# Patient Record
Sex: Male | Born: 1935 | Race: White | Hispanic: No | Smoking: Never smoker
Health system: Southern US, Community
[De-identification: ages and names within clinical notes are randomized; demographics above are authoritative.]

## PROBLEM LIST (undated history)

## (undated) DIAGNOSIS — I472 Ventricular tachycardia, unspecified: Secondary | ICD-10-CM

## (undated) DIAGNOSIS — E039 Hypothyroidism, unspecified: Secondary | ICD-10-CM

## (undated) DIAGNOSIS — I219 Acute myocardial infarction, unspecified: Secondary | ICD-10-CM

## (undated) DIAGNOSIS — K279 Peptic ulcer, site unspecified, unspecified as acute or chronic, without hemorrhage or perforation: Secondary | ICD-10-CM

## (undated) DIAGNOSIS — E785 Hyperlipidemia, unspecified: Secondary | ICD-10-CM

## (undated) DIAGNOSIS — I10 Essential (primary) hypertension: Secondary | ICD-10-CM

## (undated) DIAGNOSIS — M069 Rheumatoid arthritis, unspecified: Secondary | ICD-10-CM

## (undated) DIAGNOSIS — I509 Heart failure, unspecified: Secondary | ICD-10-CM

## (undated) DIAGNOSIS — M1990 Unspecified osteoarthritis, unspecified site: Secondary | ICD-10-CM

## (undated) DIAGNOSIS — I255 Ischemic cardiomyopathy: Secondary | ICD-10-CM

## (undated) DIAGNOSIS — Z95810 Presence of automatic (implantable) cardiac defibrillator: Secondary | ICD-10-CM

## (undated) DIAGNOSIS — E119 Type 2 diabetes mellitus without complications: Secondary | ICD-10-CM

## (undated) DIAGNOSIS — I2510 Atherosclerotic heart disease of native coronary artery without angina pectoris: Secondary | ICD-10-CM

## (undated) HISTORY — DX: Ventricular tachycardia, unspecified: I47.20

## (undated) HISTORY — DX: Atherosclerotic heart disease of native coronary artery without angina pectoris: I25.10

## (undated) HISTORY — DX: Peptic ulcer, site unspecified, unspecified as acute or chronic, without hemorrhage or perforation: K27.9

## (undated) HISTORY — PX: INGUINAL HERNIA REPAIR: SUR1180

## (undated) HISTORY — DX: Heart failure, unspecified: I50.9

## (undated) HISTORY — DX: Ischemic cardiomyopathy: I25.5

## (undated) HISTORY — DX: Ventricular tachycardia: I47.2

## (undated) HISTORY — DX: Essential (primary) hypertension: I10

## (undated) HISTORY — DX: Hyperlipidemia, unspecified: E78.5

---

## 1998-08-16 HISTORY — PX: CORONARY ARTERY BYPASS GRAFT: SHX141

## 2006-09-07 ENCOUNTER — Ambulatory Visit: Payer: Self-pay | Admitting: Cardiology

## 2006-09-22 ENCOUNTER — Ambulatory Visit: Payer: Self-pay

## 2006-11-01 ENCOUNTER — Ambulatory Visit: Payer: Self-pay | Admitting: Internal Medicine

## 2006-11-29 ENCOUNTER — Ambulatory Visit: Payer: Self-pay | Admitting: Internal Medicine

## 2006-11-29 ENCOUNTER — Ambulatory Visit (HOSPITAL_COMMUNITY): Admission: RE | Admit: 2006-11-29 | Discharge: 2006-11-30 | Payer: Self-pay | Admitting: Internal Medicine

## 2006-12-19 ENCOUNTER — Ambulatory Visit: Payer: Self-pay | Admitting: Cardiology

## 2007-04-04 ENCOUNTER — Ambulatory Visit: Payer: Self-pay | Admitting: Internal Medicine

## 2007-06-19 ENCOUNTER — Ambulatory Visit: Payer: Self-pay | Admitting: Cardiology

## 2007-07-06 ENCOUNTER — Ambulatory Visit: Payer: Self-pay | Admitting: Internal Medicine

## 2007-10-05 ENCOUNTER — Ambulatory Visit: Payer: Self-pay | Admitting: Internal Medicine

## 2008-01-04 ENCOUNTER — Ambulatory Visit: Payer: Self-pay | Admitting: Internal Medicine

## 2008-04-02 ENCOUNTER — Ambulatory Visit: Payer: Self-pay | Admitting: Internal Medicine

## 2008-04-02 ENCOUNTER — Ambulatory Visit: Payer: Self-pay | Admitting: Cardiology

## 2008-04-03 ENCOUNTER — Ambulatory Visit: Payer: Self-pay | Admitting: Internal Medicine

## 2008-05-16 HISTORY — PX: CARPAL TUNNEL RELEASE: SHX101

## 2008-06-04 ENCOUNTER — Ambulatory Visit (HOSPITAL_COMMUNITY): Admission: RE | Admit: 2008-06-04 | Discharge: 2008-06-04 | Payer: Self-pay | Admitting: Orthopedic Surgery

## 2008-07-04 ENCOUNTER — Ambulatory Visit: Payer: Self-pay | Admitting: Internal Medicine

## 2008-10-03 ENCOUNTER — Ambulatory Visit: Payer: Self-pay | Admitting: Internal Medicine

## 2008-10-31 ENCOUNTER — Encounter: Payer: Self-pay | Admitting: Internal Medicine

## 2009-01-02 ENCOUNTER — Ambulatory Visit: Payer: Self-pay | Admitting: Internal Medicine

## 2009-01-07 ENCOUNTER — Encounter: Payer: Self-pay | Admitting: Internal Medicine

## 2009-01-29 ENCOUNTER — Encounter (INDEPENDENT_AMBULATORY_CARE_PROVIDER_SITE_OTHER): Payer: Self-pay | Admitting: *Deleted

## 2009-04-12 DIAGNOSIS — E119 Type 2 diabetes mellitus without complications: Secondary | ICD-10-CM

## 2009-04-12 DIAGNOSIS — M069 Rheumatoid arthritis, unspecified: Secondary | ICD-10-CM | POA: Insufficient documentation

## 2009-04-12 DIAGNOSIS — IMO0001 Reserved for inherently not codable concepts without codable children: Secondary | ICD-10-CM | POA: Insufficient documentation

## 2009-04-12 DIAGNOSIS — R079 Chest pain, unspecified: Secondary | ICD-10-CM

## 2009-04-12 DIAGNOSIS — Z794 Long term (current) use of insulin: Secondary | ICD-10-CM

## 2009-04-12 DIAGNOSIS — R0602 Shortness of breath: Secondary | ICD-10-CM

## 2009-04-12 DIAGNOSIS — E785 Hyperlipidemia, unspecified: Secondary | ICD-10-CM | POA: Insufficient documentation

## 2009-04-12 DIAGNOSIS — I10 Essential (primary) hypertension: Secondary | ICD-10-CM

## 2009-04-12 DIAGNOSIS — K279 Peptic ulcer, site unspecified, unspecified as acute or chronic, without hemorrhage or perforation: Secondary | ICD-10-CM | POA: Insufficient documentation

## 2009-04-17 ENCOUNTER — Encounter: Payer: Self-pay | Admitting: Cardiology

## 2009-04-17 ENCOUNTER — Ambulatory Visit: Payer: Self-pay | Admitting: Internal Medicine

## 2009-04-17 ENCOUNTER — Ambulatory Visit: Payer: Self-pay | Admitting: Cardiology

## 2009-04-17 DIAGNOSIS — Z951 Presence of aortocoronary bypass graft: Secondary | ICD-10-CM

## 2009-04-17 DIAGNOSIS — I255 Ischemic cardiomyopathy: Secondary | ICD-10-CM

## 2009-04-17 DIAGNOSIS — I5023 Acute on chronic systolic (congestive) heart failure: Secondary | ICD-10-CM | POA: Insufficient documentation

## 2009-05-29 ENCOUNTER — Encounter: Payer: Self-pay | Admitting: Cardiology

## 2009-07-07 ENCOUNTER — Telehealth: Payer: Self-pay | Admitting: Cardiology

## 2009-07-14 ENCOUNTER — Telehealth: Payer: Self-pay | Admitting: Cardiology

## 2009-07-24 ENCOUNTER — Encounter: Payer: Self-pay | Admitting: Internal Medicine

## 2009-10-14 ENCOUNTER — Encounter: Payer: Self-pay | Admitting: Cardiology

## 2009-10-23 ENCOUNTER — Ambulatory Visit: Payer: Self-pay | Admitting: Internal Medicine

## 2010-02-05 ENCOUNTER — Ambulatory Visit: Payer: Self-pay | Admitting: Internal Medicine

## 2010-02-26 ENCOUNTER — Encounter: Payer: Self-pay | Admitting: Internal Medicine

## 2010-03-16 HISTORY — PX: CARPAL TUNNEL RELEASE: SHX101

## 2010-04-08 ENCOUNTER — Ambulatory Visit (HOSPITAL_COMMUNITY): Admission: RE | Admit: 2010-04-08 | Discharge: 2010-04-08 | Payer: Self-pay | Admitting: Orthopedic Surgery

## 2010-05-07 ENCOUNTER — Ambulatory Visit: Payer: Self-pay | Admitting: Internal Medicine

## 2010-06-08 ENCOUNTER — Ambulatory Visit: Payer: Self-pay | Admitting: Cardiology

## 2010-08-05 ENCOUNTER — Telehealth: Payer: Self-pay | Admitting: Cardiology

## 2010-08-06 ENCOUNTER — Ambulatory Visit: Payer: Self-pay | Admitting: Internal Medicine

## 2010-08-11 ENCOUNTER — Encounter: Payer: Self-pay | Admitting: Internal Medicine

## 2010-08-18 ENCOUNTER — Encounter: Payer: Self-pay | Admitting: Internal Medicine

## 2010-08-28 ENCOUNTER — Telehealth (INDEPENDENT_AMBULATORY_CARE_PROVIDER_SITE_OTHER): Payer: Self-pay | Admitting: *Deleted

## 2010-08-31 ENCOUNTER — Ambulatory Visit: Admission: RE | Admit: 2010-08-31 | Discharge: 2010-08-31 | Payer: Self-pay | Source: Home / Self Care

## 2010-08-31 ENCOUNTER — Encounter (HOSPITAL_COMMUNITY)
Admission: RE | Admit: 2010-08-31 | Discharge: 2010-09-15 | Payer: Self-pay | Source: Home / Self Care | Attending: Cardiology | Admitting: Cardiology

## 2010-08-31 ENCOUNTER — Encounter: Payer: Self-pay | Admitting: Internal Medicine

## 2010-09-15 NOTE — Letter (Signed)
Summary: Remote Device Check  [employer], Main Office  [address]   [place], [place] [address]   Phone: [phone]  Fax: [phone]     February 26, 2010 MRN: [id number]   [name] [address] [place], [place]  [address]   Dear [name],   Your remote transmission was recieved and reviewed by your physician.  All diagnostics were within normal limits for you.   ___X___Your next office visit is scheduled for:  05-07-10 @ 945am with Dr Graciela Husbands.   Sincerely,  Vella Kohler

## 2010-09-15 NOTE — Cardiovascular Report (Signed)
Summary: Office Visit Remote   Office Visit Remote   Imported By: Roderic Ovens 02/27/2010 16:15:31  _____________________________________________________________________  External Attachment:    Type:   Image     Comment:   External Document

## 2010-09-15 NOTE — Assessment & Plan Note (Signed)
Summary: F1Y/DM   Primary Provider:  Lucila Maine, MD   History of Present Illness: [name] is a pleasant gentleman who has a history of coronary disease, status post coronary artery bypassing graft, ischemic cardiomyopathy, hypertension, diabetes, and hyperlipidemia.  His most recent Myoview was performed on September 22, 2006.  At that time, his ejection fraction was 26%.  There was moderate- to-large apical and inferior infarction, but there was no ischemia.  He also has had a previous defibrillator placed.  I last saw him in Sept 2010. Since then he has dyspnea with more extreme activities but not with routine activities. It is relieved with rest. There is no associated chest pain. There is no orthopnea, PND, pedal edema, syncope, palpitations. His ICD has not discharged.  Current Medications (verified): 1)  Lisinopril 10 Mg Tabs (Lisinopril) .... Take One Tablet By Mouth Daily 2)  Carvedilol 25 Mg Tabs (Carvedilol) .... Take 1 Tablet By Mouth Twice A Day 3)  Lantus 100 Unit/ml Soln (Insulin Glargine) .... Uad 4)  Glyburide-Metformin 5-500 Mg Tabs (Glyburide-Metformin) .... Take One Tablet By Mouth Once Daily. 5)  Nexium 40 Mg Cpdr (Esomeprazole Magnesium) .... Take One Tablet By Mouth Once Daily. 6)  Meloxicam 7.5 .... Three Times A Day 7)  Aspirin Ec 325 Mg Tbec (Aspirin) .... Take One Tablet By Mouth Once Daily. 8)  Glucosamine-Chondroitin  Caps (Glucosamine-Chondroit-Vit C-Mn) .... Take One Tablet By Mouth Once Daily. 9)  Lipitor 10 Mg Tabs (Atorvastatin Calcium) .... Take One Tablet By Mouth Daily. 10)  Niaspan 1000 Mg Cr-Tabs (Niacin (Antihyperlipidemic)) .Marland Kitchen.. 1 Tab By Mouth Once Daily  Allergies: No Known Drug Allergies  Past History:  Past Medical History: HYPERLIPIDEMIA (ICD-272.4) HYPERTENSION (ICD-401.9) CAD (ICD-414.00) CARDIOMYOPATHY (ICD-425.4) CHF (ICD-428.0) ARTHRITIS, RHEUMATOID (ICD-714.0) DM (ICD-250.00) PUD (ICD-533.90)  Past Surgical History: Reviewed  history from 04/12/2009 and no changes required.  Decompression, left median nerve.      status post coronary artery bypassing  graft   Status post implantable cardioverter defibrillator  Social History: Reviewed history from 04/12/2009 and no changes required. As noted previously, he is married.  He has 1 surviving  child and 4 surviving grandchildren.  He does not use cigarettes,  alcohol, or recreational drugs.  Review of Systems       Problems with arthralgias but no fevers or chills, productive cough, hemoptysis, dysphasia, odynophagia, melena, hematochezia, dysuria, hematuria, rash, seizure activity, orthopnea, PND, pedal edema, claudication. Remaining systems are negative.   Vital Signs:  Patient profile:   76 year old male Height:      71 inches Weight:      210 pounds BMI:     29.39 Pulse rate:   71 / minute Resp:     14 per minute BP sitting:   118 / 70  (left arm)  Vitals Entered By: [name] (June 08, 2010 9:57 AM)  Physical Exam  General:  Well-developed well-nourished in no acute distress.  Skin is warm and dry.  HEENT is normal.  Neck is supple. No thyromegaly.  Chest is clear to auscultation with normal expansion.  Cardiovascular exam is regular rate and rhythm.  Abdominal exam nontender or distended. No masses palpated. Extremities show no edema. neuro grossly intact    EKG  Procedure date:  06/08/2010  Findings:      normal sinus rhythm at a rate of 71. Left axis deviation. Prior anterior and inferior infarct. Nonspecific T-wave changes.   ICD Specifications Following MD:  Raymond Manges, MD  Referring MD:  [name] ICD Vendor:  [employer]     ICD Model Number:  T175     ICD Serial Number:  [id number] ICD DOI:  11/29/2006     ICD Implanting MD:  Raymond Manges, MD  Lead 1:    Location: RV     DOI: 11/29/2006     Model #: [id number]     Serial #: [id number]     Status: active  Indications::  ICM   ICD Follow Up ICD Dependent:  No        Brady Parameters Mode VVI     Lower Rate Limit:  40      Tachy Zones VF:  220     VT:  200     VT1:  170     Impression & Recommendations:  Problem # 1:  CAD (ICD-414.00)  Continue aspirin, beta blocker, ACE inhibitor and statin. Schedule Myoview for risk stratification. His updated medication list for this problem includes:    Lisinopril 10 Mg Tabs (Lisinopril) .Marland Kitchen... Take one tablet by mouth daily    Carvedilol 25 Mg Tabs (Carvedilol) .Marland Kitchen... Take 1 tablet by mouth twice a day    Aspirin Ec 325 Mg Tbec (Aspirin) .Marland Kitchen... Take one tablet by mouth once daily.  His updated medication list for this problem includes:    Lisinopril 10 Mg Tabs (Lisinopril) .Marland Kitchen... Take one tablet by mouth daily    Carvedilol 25 Mg Tabs (Carvedilol) .Marland Kitchen... Take 1 tablet by mouth twice a day    Aspirin Ec 325 Mg Tbec (Aspirin) .Marland Kitchen... Take one tablet by mouth once daily.  Orders: EKG w/ Interpretation (93000) Nuclear Stress Test (Nuc Stress Test)  Problem # 2:  CARDIOMYOPATHY, ISCHEMIC (ICD-414.8)  Continued ACE inhibitor and beta blocker. His updated medication list for this problem includes:    Lisinopril 10 Mg Tabs (Lisinopril) .Marland Kitchen... Take one tablet by mouth daily    Carvedilol 25 Mg Tabs (Carvedilol) .Marland Kitchen... Take 1 tablet by mouth twice a day    Aspirin Ec 325 Mg Tbec (Aspirin) .Marland Kitchen... Take one tablet by mouth once daily.  His updated medication list for this problem includes:    Lisinopril 10 Mg Tabs (Lisinopril) .Marland Kitchen... Take one tablet by mouth daily    Carvedilol 25 Mg Tabs (Carvedilol) .Marland Kitchen... Take 1 tablet by mouth twice a day    Aspirin Ec 325 Mg Tbec (Aspirin) .Marland Kitchen... Take one tablet by mouth once daily.  Problem # 3:  IMPLANTABLE  DEFIBRILLATOR GDT (ICD-V45.02) Magement per electrophysiology.  Problem # 4:  HYPERLIPIDEMIA (ICD-272.4) Continue present medications. Lipids and liver monitored by primary care. His updated medication list for this problem includes:    Lipitor 10 Mg Tabs (Atorvastatin calcium)  .Marland Kitchen... Take one tablet by mouth daily.    Niaspan 1000 Mg Cr-tabs (Niacin (antihyperlipidemic)) .Marland Kitchen... 1 tab by mouth once daily  His updated medication list for this problem includes:    Lipitor 10 Mg Tabs (Atorvastatin calcium) .Marland Kitchen... Take one tablet by mouth daily.    Niaspan 1000 Mg Cr-tabs (Niacin (antihyperlipidemic)) .Marland Kitchen... 1 tab by mouth once daily  Problem # 5:  HYPERTENSION (ICD-401.9) Blood pressure controlled on present medications. Will continue. Potassium and renal function monitored by primary care. His updated medication list for this problem includes:    Lisinopril 10 Mg Tabs (Lisinopril) .Marland Kitchen... Take one tablet by mouth daily    Carvedilol 25 Mg Tabs (Carvedilol) .Marland Kitchen... Take 1 tablet by mouth twice a day    Aspirin Ec 325 Mg  Tbec (Aspirin) .Marland Kitchen... Take one tablet by mouth once daily.  Problem # 6:  DM (ICD-250.00)  His updated medication list for this problem includes:    Lisinopril 10 Mg Tabs (Lisinopril) .Marland Kitchen... Take one tablet by mouth daily    Lantus 100 Unit/ml Soln (Insulin glargine) ..... Uad    Glyburide-metformin 5-500 Mg Tabs (Glyburide-metformin) .Marland Kitchen... Take one tablet by mouth once daily.    Aspirin Ec 325 Mg Tbec (Aspirin) .Marland Kitchen... Take one tablet by mouth once daily.  His updated medication list for this problem includes:    Lisinopril 10 Mg Tabs (Lisinopril) .Marland Kitchen... Take one tablet by mouth daily    Lantus 100 Unit/ml Soln (Insulin glargine) ..... Uad    Glyburide-metformin 5-500 Mg Tabs (Glyburide-metformin) .Marland Kitchen... Take one tablet by mouth once daily.    Aspirin Ec 325 Mg Tbec (Aspirin) .Marland Kitchen... Take one tablet by mouth once daily.  Problem # 7:  ARTHRITIS, RHEUMATOID (ICD-714.0)  Patient Instructions: 1)  Your physician recommends that you schedule a follow-up appointment in: YEAR WITH DR CRENSHAW 2)  Your physician recommends that you continue on your current medications as directed. Please refer to the Current Medication list given to you today. 3)  Your physician has  requested that you have an LEXISCAN myoview.  For further information please visit [email or web address]  Please follow instruction sheet, as given.

## 2010-09-15 NOTE — Cardiovascular Report (Signed)
Summary: Office Visit Remote   Office Visit Remote   Imported By: Roderic Ovens 11/03/2009 14:01:39  _____________________________________________________________________  External Attachment:    Type:   Image     Comment:   External Document

## 2010-09-15 NOTE — Procedures (Signed)
Summary: Cardiology Device Clinic   Allergies: No Known Drug Allergies   ICD Specifications Following MD:  Sherryl Manges, MD     Referring MD:  [name] ICD Vendor:  [facility] [employer]     ICD Model Number:  T175     ICD Serial Number:  [id number] ICD DOI:  11/29/2006     ICD Implanting MD:  Sherryl Manges, MD  Lead 1:    Location: RV     DOI: 11/29/2006     Model #: [id number]     Serial #: [id number]     Status: active  Indications::  ICM   ICD Follow Up Remote Check?  No Battery Voltage:  2.99 V     Charge Time:  8.2 seconds     Underlying rhythm:  SR ICD Dependent:  No       ICD Device Measurements Right Ventricle:  Amplitude: 11.9 mV, Impedance: 530 ohms, Threshold: 0.6 V at 0.4 msec Shock Impedance: 40 ohms   Episodes Shock:  0     ATP:  0     Nonsustained:  0     Ventricular Pacing:  0%  Brady Parameters Mode VVI     Lower Rate Limit:  40      Tachy Zones VF:  220     VT:  200     VT1:  170     Next Remote Date:  08/06/2010     Next Cardiology Appt Due:  04/17/2011 Tech Comments:  No parameter changes.  Device function normal.  Latitude transmissions every 3 months.  ROV 1 year with Dr. Graciela Husbands in [place]. Altha Harm, LPN  May 07, 2010 10:18 AM

## 2010-09-17 NOTE — Progress Notes (Signed)
Summary: Nuclear Pre-Procedure  Phone Note Outgoing Call Call back at Home Phone [phone]   Call placed by: Stanton Kidney, EMT-P,  August 28, 2010 3:24 PM Call placed to: Patient Action Taken: Phone Call Completed Summary of Call: Left message with information on Myoview Information Sheet (see scanned document for details). Stanton Kidney, EMT-P  August 28, 2010 3:24 PM     Nuclear Med Background Indications for Stress Test: Evaluation for Ischemia, Graft Patency   History: CABG, Defibrillator, Myocardial Perfusion Study  History Comments: '00 CABG x4 '08 defibrillator 2/08 MPS: EF=26%  Symptoms: Chest Pain, DOE    Nuclear Pre-Procedure Cardiac Risk Factors: Family History - CAD, Hypertension, IDDM Type 2, Lipids Height (in): 71

## 2010-09-17 NOTE — Assessment & Plan Note (Signed)
Summary: Cardiology Nuclear Testing  Nuclear Med Background Indications for Stress Test: Evaluation for Ischemia, Graft Patency   History: CABG, Defibrillator, Myocardial Perfusion Study  History Comments: '00 CABG x 4; '08 ICD; '08 MPS:no ischemia, EF=26%  Symptoms: Chest Pain, DOE, Fatigue  Symptoms Comments: Last episode of CP:2-3 weeks ago.   Nuclear Pre-Procedure Cardiac Risk Factors: Family History - CAD, Hypertension, IDDM Type 2, Lipids Caffeine/Decaff Intake: 1 sip at 7:00 AM NPO After: 7:00 AM Lungs: Clear.  O2 Sat 98% on RA. IV 0.9% NS with Angio Cath: 20g     IV Site: R Antecubital IV Started by: Irean Hong, RN Chest Size (in) 42     Height (in): 71 Weight (lb): 209 BMI: 29.25  Nuclear Med Study 1 or 2 day study:  1 day     Stress Test Type:  Eugenie Birks Reading MD:  Arvilla Meres, MD     Referring MD:  Olga Millers, MD Resting Radionuclide:  Technetium 42m Tetrofosmin     Resting Radionuclide Dose:  11 mCi  Stress Radionuclide:  Technetium 11m Tetrofosmin     Stress Radionuclide Dose:  33 mCi   Stress Protocol   Lexiscan: 0.4 mg   Stress Test Technologist:  [employer]a [employer], CMA-N     Nuclear Technologist:  Doyne Keel, CNMT  Rest Procedure  Myocardial perfusion imaging was performed at rest 45 minutes following the intravenous administration of Technetium 55m Tetrofosmin.  Stress Procedure  The patient received IV Lexiscan 0.4 mg over 15-seconds.  Technetium 69m Tetrofosmin injected at 30-seconds.  There were no significant changes with infusion.  Quantitative spect images were obtained after a 45 minute delay.  QPS Raw Data Images:  LV is dilated. No significant motion. Stress Images:  Markedly decreased uptake throughout entire inferior wall, inferoseptum and apex. Rest Images:  Markedly decreased uptake throughout entire inferior wall, inferoseptum and apex. Subtraction (SDS):  Large infarct in inferior wall, inferoseptum and apex. No signifcant  ischemia. Transient Ischemic Dilatation:  0.97  (Normal <1.22)  Lung/Heart Ratio:  0.40  (Normal <0.45)  Quantitative Gated Spect Images QGS EDV:  131 ml QGS ESV:  90 ml QGS EF:  31 % QGS cine images:  The LV is dilated with dyskinesis of the septum and apex. Inferior hypokinesis.  Findings Abnormal nuclear study  Evidence for inferior infarct  Evidence for LV Dysfunction LV Dysfunction    Overall Impression  Exercise Capacity: Lexiscan with no exercise. ECG Impression: No significant ST segment change with Lexiscan. Overall Impression: Abnormal stress nuclear study. Overall Impression Comments: Large infarct in inferior wall, inferoseptum and apex. No signifcant ischemia. EF 31%.  Appended Document: Cardiology Nuclear Testing ok  Appended Document: Cardiology Nuclear Testing Left message to call back    Appended Document: Cardiology Nuclear Testing PT AWARE./CY

## 2010-09-17 NOTE — Progress Notes (Signed)
Summary: refill meds  Phone Note Refill Request Call back at Home Phone [phone] Message from:  Patient on August 05, 2010 4:11 PM  Refills Requested: Medication #1:  NIASPAN 1000 MG CR-TABS 1 tab by mouth once daily[facility] [place]    Method Requested: Fax to Local Pharmacy Initial call taken by: Lorne Skeens,  August 05, 2010 4:11 PM    Prescriptions: NIASPAN 1000 MG CR-TABS (NIACIN (ANTIHYPERLIPIDEMIC)) 1 tab by mouth once daily  #30 x 12   Entered by:   Kem Parkinson   Authorized by:   Ferman Hamming, MD, [facility]   Signed by:   Kem Parkinson on 08/06/2010   Method used:   [facility]  [address] [id number]       [address]       [place], [place]  [address]       Ph: [phone] or [phone]       Fax: [phone]   RxID:   [id number] NIASPAN 1000 MG CR-TABS (NIACIN (ANTIHYPERLIPIDEMIC)) 1 tab by mouth once daily  #90 x 3   Entered by:   Kem Parkinson   Authorized by:   Ferman Hamming, MD, [facility]   Signed by:   Kem Parkinson on 08/06/2010   Method used:   [facility]        [facility]  [id number]       [address]       [place], [place]  [address]       Ph: [phone] or [phone]       Fax: [phone]   RxID:   [id number]

## 2010-09-17 NOTE — Letter (Signed)
Summary: Device-Delinquent Phone Journalist, newspaper, Main Office  [address]   [place], [place] [address]   Phone: [phone]  Fax: [phone]     August 11, 2010 MRN: [id number]   KAHRON KAUTH [address] [place], [place]  [address]   Dear Mr. WISHAM,  According to our records, you were scheduled for a device phone transmission on 08-06-2010.     We did not receive any results from this check.  If you transmitted on your scheduled day, please call us to help troubleshoot your system.  If you forgot to send your transmission, please send one upon receipt of this letter.  Thank you,   Architectural technologist Device Clinic

## 2010-10-05 ENCOUNTER — Telehealth: Payer: Self-pay | Admitting: Cardiology

## 2010-10-07 NOTE — Cardiovascular Report (Signed)
Summary: Office Visit Remote   Office Visit Remote   Imported By: Roderic Ovens 09/29/2010 14:27:44  _____________________________________________________________________  External Attachment:    Type:   Image     Comment:   External Document

## 2010-10-13 NOTE — Progress Notes (Signed)
Summary: rx refill  Phone Note Refill Request Message from:  Patient on October 05, 2010 4:42 PM  Refills Requested: Medication #1:  NIASPAN 1000 MG CR-TABS 1 tab by mouth once daily. [facility] [phone] for 90 day supply   Method Requested: Fax to Local Pharmacy Initial call taken by: Roe Coombs,  October 05, 2010 4:43 PM    Prescriptions: NIASPAN 1000 MG CR-TABS (NIACIN (ANTIHYPERLIPIDEMIC)) 1 tab by mouth once daily  #90 Not Speci x 2   Entered by:   Kem Parkinson   Authorized by:   Ferman Hamming, MD, [facility]   Signed by:   Kem Parkinson on 10/06/2010   Method used:   Faxed to ...       [facility] (mail-[facility])       [id number]       [place], [place]  [id number]       Ph:        Fax: [phone]   RxID:   [id number]

## 2010-10-30 LAB — CBC
Hemoglobin: 12.7 g/dL — ABNORMAL LOW (ref 13.0–17.0)
MCHC: 34.3 g/dL (ref 30.0–36.0)
MCV: 82.4 fL (ref 78.0–100.0)
Platelets: 149 10*3/uL — ABNORMAL LOW (ref 150–400)
WBC: 4.9 10*3/uL (ref 4.0–10.5)

## 2010-10-30 LAB — BASIC METABOLIC PANEL
BUN: 11 mg/dL (ref 6–23)
GFR calc Af Amer: >60 mL/min (ref >60)
GFR calc non Af Amer: >60 mL/min (ref >60)

## 2010-10-30 LAB — GLUCOSE, CAPILLARY
Glucose-Capillary: 126 mg/dL — ABNORMAL HIGH (ref 70–99)
Glucose-Capillary: 135 mg/dL — ABNORMAL HIGH (ref 70–99)

## 2010-11-19 ENCOUNTER — Ambulatory Visit (INDEPENDENT_AMBULATORY_CARE_PROVIDER_SITE_OTHER): Payer: Medicare PPO | Admitting: *Deleted

## 2010-11-19 DIAGNOSIS — R0989 Other specified symptoms and signs involving the circulatory and respiratory systems: Secondary | ICD-10-CM

## 2010-11-19 DIAGNOSIS — Z95810 Presence of automatic (implantable) cardiac defibrillator: Secondary | ICD-10-CM

## 2010-11-19 DIAGNOSIS — I428 Other cardiomyopathies: Secondary | ICD-10-CM

## 2010-11-20 ENCOUNTER — Other Ambulatory Visit: Payer: Self-pay

## 2010-11-24 NOTE — Progress Notes (Signed)
icd remote check  

## 2010-12-29 NOTE — Assessment & Plan Note (Signed)
[facility]                        [place] CARDIOLOG[facility] OFFICE NOTE   NAME:Stengel, DAVIEL ALLEGRETTO                        MRN:          [id number]  DATE:05/07/2010                            DOB:          04/01/1935    Mr. Kuhlman is seen in followup for an ICD implanted for primary  prevention in the setting of ischemic heart disease.  He was recently  hospitalized for chest pain.  He is found to have a touch of  pneumonia.  He underwent EGD which demonstrated healing of a bacterial  infection.  He apparently ruled out for myocardial infarction as stress  testing was not pursued.  He has not had any exertional chest discomfort  since then.  His major limitation remains his legs.   CURRENT MEDICATIONS:  Glyburide 5/500, Coreg 12.5 b.i.d., Lipitor,  lisinopril, Niaspan, Nexium, meloxicam, aspirin, and Lantus.   PHYSICAL EXAMINATION:  VITAL SIGNS:  His blood pressure is 118/72, his  pulse was 68, his weight was 214.  HEENT:  Normal.  Neck veins were flat.  LUNGS:  Clear.  HEART:  Heart sounds were regular.  ABDOMEN:  Soft with active bowel sounds, protuberant.  EXTREMITIES:  Pulses distally were examined and were intact.  There was  no clubbing or cyanosis, there was some edema, right greater than left.   Interrogation of his [employer] ICD demonstrated no  intercurrent events, R-wave of 11.9 with pace impedance of 530 threshold  of 0.6-0.4, high-voltage impedance was 40 ohms.  Battery voltage 2.99,  representing status BOL.   IMPRESSION:  1. Ischemic cardiomyopathy.  2. Status post implantable cardioverter-defibrillator for primary      prevention.  3. Recent hospitalization for chest pain with a noncardiac notable for      some gastroesophagitis and now symptoms resolved.   Mr. Isenberg is stable from arrhythmia and cardiac point of view.  He has  recurrent chest pain.  I think it would be appropriate to consider a  stress test.     Duke Salvia, MD, [facility]  Electronically Signed    SCK/MedQ  DD: 05/07/2010  DT: 05/07/2010  Job #: [id number]   cc:   Lucila Maine, MD

## 2010-12-29 NOTE — Assessment & Plan Note (Signed)
[facility]                         ELECTROPHYSIOLOGY OFFICE NOTE   NAME:[name], [name]                        MRN:          [id number]  DATE:04/02/2008                            DOB:          01-28-1935    [name] is seen in followup for an ICD implanted for primary  prevention.   He has still had problems with arthritis, seems to be getting into some  rehabilitation, organized by Dr. [name].   CURRENT MEDICATIONS:  Lantus, glyburide, Nexium, Mobic, Lipitor,  Ecotrin, Coreg 25 b.i.d., and lisinopril 10.   PHYSICAL EXAMINATION:  VITAL SIGNS:  Blood pressure was 96/56 with a  pulse of 75.  LUNGS:  Clear.  HEART:  Heart sounds were regular.  EXTREMITIES:  Without edema.   Interrogation of his [employer] ICD demonstrates R-wave of 15 millivolts,  impedance of 500, threshold of 0.8 and 0.5 with battery voltage of 3.21.   IMPRESSION:  1. Ischemic heart disease.      a.     Prior bypass grafting.      b.     Negative Myoview.      c.     Ejection fraction of 35%.  2. Moderate-to-severe pulmonary hypertension.  3. Status post implantable cardioverter-defibrillator for primary      prevention.   [name] is stable.  We will see him again,  as far as Device Clinic.  He will follow up remotely in the interim.     Duke Salvia, MD, [facility]  Electronically Signed    SCK/MedQ  DD: 04/02/2008  DT: 04/03/2008  Job #: [id number]

## 2010-12-29 NOTE — Assessment & Plan Note (Signed)
[facility]                            CARDIOLOGY OFFICE NOTE   NAME:Labat, CARMINE CARROZZA                        MRN:          [id number]  DATE:06/19/2007                            DOB:          10/19/1934    Mr. Man is a very pleasant gentleman who has a history of coronary  disease, status post coronary bypass graft, ischemic cardiomyopathy,  hypertension, diabetes and hyperlipidemia.  His most recent Myoview was  performed in February of 2008.  At that time, his ejection fraction was  noted to be 26%.  There was a moderate to large apical inferior  infarction, but there was no ischemia.  He subsequently had an ICD  placed.  Since then, he has done well.  He denies any significant  orthopnea, PND, pedal edema, palpitations, presyncope, syncope or chest  pain.  His ICD has not fired.  He occasionally has minimal dyspnea on  exertion.   MEDICATIONS ARE PRESENT:  1. Insulin.  2. Glyburide 5/500 daily.  3. Nexium 40 mg p.o. daily.  4. Mobic 7.5 mg p.o. daily.  5. Lipitor 10 mg p.o. daily.  6. Aspirin 325 mg p.o. daily.  7. Coreg 25 mg p.o. b.i.d.  8. Lisinopril 10 mg p.o. daily.   PHYSICAL EXAMINATION TODAY:  VITAL SIGNS:  Blood pressure of 128/80,  pulse 72.  HEENT:  Normal.  NECK:  Supple.  CHEST:  Clear.  CARDIOVASCULAR:  Regular rate and rhythm.  ABDOMEN:  No tenderness.  EXTREMITIES:  No edema.   ELECTROCARDIOGRAM:  Sinus rhythm at a rate of 72.  There is a prior  inferior infarction, as well as anterior infarction.  There is a loud T  wave inversion.   DIAGNOSES:  1. Coronary artery disease status post coronary artery bypass graft;      his recent Myoview showed no ischemia.  We will continue his      medical therapy including his aspirin, statin, ACE inhibitor and      beta blocker.  2. Ischemic cardiomyopathy, as per #1.  3. Diabetes mellitus, per Dr. Lorin Picket.  4. Hypertension.  His blood pressure is adequately controlled on his  present medications.  5. Hyperlipidemia.  He will continue on a statin, and our goal will be      less than 7, given his history of coronary disease.  This is being      followed by Dr. Lorin Picket.  6. Rheumatoid arthritis.  7. History of peptic ulcer disease.  8. Status post ICD.  He will follow up with Dr. Graciela Husbands concerning this      issue.   We will see him back in 9 months.     Madolyn Frieze [name], MD, [facility]  Electronically Signed    BSC/MedQ  DD: 06/19/2007  DT: 06/20/2007  Job #: [id number]   cc:   Lucila Maine, M.D.

## 2010-12-29 NOTE — Assessment & Plan Note (Signed)
[facility]                            CARDIOLOGY OFFICE NOTE   NAME:[name], [name]                        MRN:          [id number]  DATE:04/02/2008                            DOB:          1934/11/28    HISTORY OF PRESENT ILLNESS:  [name] is a pleasant gentleman who has a  history of coronary disease, status post coronary artery bypassing  graft, ischemic cardiomyopathy, hypertension, diabetes, and  hyperlipidemia.  His most recent Myoview was performed on September 22, 2006.  At that time, his ejection fraction was 26%.  There was moderate-  to-large apical and inferior infarction, but there was no ischemia.  He  also has had a previous defibrillator placed.  Since he was last seen,  he is doing well from a symptomatic standpoint.  He does have problems  with arthritis, but there is no dyspnea, chest pain, palpitations, or  syncope.  He denies any pedal edema.  Note, he does not smoke.  He is  not able to exercise routinely by his report, but he is trying to follow  the diet.   MEDICATIONS:  1. Insulin.  2. Glyburide 5/500 daily.  3. Nexium 40 mg p.o. daily.  4. Mobic 7.5 mg p.o. daily.  5. Lipitor 10 mg p.o. daily.  6. Aspirin 325 mg p.o. daily.  7. Coreg 25 mg p.o. b.i.d.  8. Lisinopril 10 mg p.o. daily.   PHYSICAL EXAMINATION:  VITAL SIGNS:  Shows a blood pressure of 96/56,  and his pulse is 75.  He weighs 211 pounds.  HEENT:  Normal.  NECK:  Supple.  CHEST:  Clear.  CARDIOVASCULAR:  Regular rate and rhythm.  ABDOMEN:  Shows no tenderness and no bruit.  EXTREMITIES:  Show no edema.   His electrocardiogram shows a sinus rhythm at a rate of 77.  There is a  prior anterior and inferior infarct.   DIAGNOSES:  1. Coronary artery disease status post coronary artery bypassing graft      - [name] is doing well from a symptomatic standpoint with no      chest pain or shortness of breath.  He will continue his aspirin,      beta-blocker,  statin, and ACE inhibitor.  We discussed importance      of risk factor modification today including diet and exercise.  He      does not smoke.  2. Ischemic cardiomyopathy - As per coronary artery disease.  3. Diabetes mellitus - Management per Dr. Lorin Picket.  4. Hypertension - His blood pressure is adequately controlled on his      present medications.  Dr. Lorin Picket apparently follows his potassium      and renal function.  5. Hyperlipidemia - He will continue on his present dose of statin.      Dr. Lorin Picket follows his lipids and liver.  A goal LDL should be less      than 70 given his history of coronary artery disease.  6. Rheumatoid arthritis.  7. History of peptic ulcer disease.  8. Status post implantable cardioverter defibrillator -  He is      scheduled to see Dr. Graciela Husbands later today concerning this issue.   I will see him back in 1 year.     Madolyn Frieze [name], MD, [facility]  Electronically Signed    BSC/MedQ  DD: 04/02/2008  DT: 04/02/2008  Job #: [id number]   cc:   Maryella Shivers. Lorin Picket, MD

## 2010-12-29 NOTE — Op Note (Signed)
NAMETIMOTY, BOURKE                 ACCOUNT NO.:  [id number]   MEDICAL RECORD NO.:  [id number]          PATIENT TYPE:  AMB   LOCATION:  SDS                          FACILITY:  [facility]   PHYSICIAN:  Cindee Salt, M.D.       DATE OF BIRTH:  03/18/1935   DATE OF PROCEDURE:  06/04/2008  DATE OF DISCHARGE:  06/04/2008                               OPERATIVE REPORT   PREOPERATIVE DIAGNOSIS:  Carpal tunnel syndrome, left hand.   POSTOPERATIVE DIAGNOSIS:  Carpal tunnel syndrome, left hand.   OPERATION:  Decompression, left median nerve.   SURGEON:  Cindee Salt, MD   ASSISTANT:  Carolyne Fiscal, RN   ANESTHESIA:  Forearm based IV regional.   ANESTHESIOLOGIST:  Bedelia Person, MD.   HISTORY:  The patient is a 76 year old male with a history of carpal  tunnel syndrome and EMG nerve conductions positive, which has not  responded to conservative treatment and he has elected to undergo  decompression.  He is aware of risks and complications of bleeding,  infection, recurrence injury to arteries, nerves, tendons, complete  relief of symptoms, and dystrophy.  In the preoperative area, the  patient is seen.  The extremity marked by both the patient and surgeon,  questions were again encouraged and answered.  Antibiotic was given.   PROCEDURE:  The patient was brought to the operating room where a  forearm based IV regional anesthetic was carried out without difficulty.  He was prepped using DuraPrep, supine position, left arm free.  A time-  out was taken.  A longitudinal incision was then made in the palm  carried down through subcutaneous tissue.  Bleeders were  electrocauterized.  Palmar fascia was split.  Superficial palmar arch  identified.  The flexor tendon of the ring little and finger identified  to the ulnar side of median nerve carpal retinaculum was incised with  sharp dissection, right angle and Sewall retractor were placed between  skin and forearm fascia.  The fascia was released for approximately  a  centimeter and half proximal to the wrist crease under direct vision.  Canal was explored.  Compression to the nerve with an hourglass  deformity was immediately apparent.  No further lesions were identified.  Tenosynovium tissue was moderately thickened.  Wound was copiously  irrigated with saline.  Skin was then closed with interrupted 5-0 Vicryl  Rapide sutures.  A sterile compressive dressing and splint to the wrist  was applied.  The patient tolerated the procedure well and was taken to  the recovery room for observation in satisfactory condition.  He will be  discharged to home.  To return to the [facility] in 1  week, on Vicodin.           ______________________________  Cindee Salt, M.D.    GK/MEDQ  D:  06/04/2008  T:  06/04/2008  Job:  [id number]

## 2010-12-29 NOTE — Assessment & Plan Note (Signed)
[facility]                         ELECTROPHYSIOLOGY OFFICE NOTE   NAME:Parmelee, AHMERE HEMENWAY                        MRN:          [id number]  DATE:04/04/2007                            DOB:          1935-06-27    Mr. Hubble has ischemic heart disease and is status post ICD  implantation.  He has had no complaints of chest pain or shortness of  breath.  He has had problems with increasing weight gain.  He has bad  arthritis in his knees.   His medications are reviewed and are notable for the intercurrent up-  titration of his Coreg to 25 b.i.d.   PHYSICAL EXAMINATION:  His blood pressure is 135/84.  His pulse is 62.  LUNGS:  Clear.  HEART:  Sounds were regular.  EXTREMITIES:  Without edema.   Interrogation of his Guidant single-chamber defibrillator demonstrates  an R wave of 15.5 with impedance of 473, a threshold of 0.6 at 0.5.  Battery voltage is 3.23.  The device was reprogrammed to maximize  longevity.  There were no intercurrent episodes.   IMPRESSION:  1. Ischemic cardiomyopathy.  2. Congestive heart failure -- class II.  3. Weight gain.  4. Diabetes.   [facility]  From my point of view, that should not be any problem.   We will plan to see him again in 9 months' time and he will be followed  remotely in the interim.     Duke Salvia, MD, [facility]  Electronically Signed    SCK/MedQ  DD: 04/04/2007  DT: 04/05/2007  Job #: [id number]   cc:   Lucila Maine MD

## 2011-01-01 NOTE — Assessment & Plan Note (Signed)
[facility]                            CARDIOLOGY OFFICE NOTE   NAME:[name], [name]                        MRN:          [id number]  DATE:09/07/2006                            DOB:          [birth year]    The patient is a very pleasant 76 year old male with a past medical  history of coronary artery disease, status post coronary artery  bypassing graft, ischemic cardiomyopathy, diabetes mellitus,  hypertension, hyperlipidemia, rheumatoid arthritis whom we were asked to  evaluate for his coronary disease.  The patient's cardiac history dates  back to 2000 when he suffered a myocardial infarction.  He underwent  coronary artery bypassing graft in [place].  I do not have those  records available.  Since then, he has done well.  He denies any dyspnea  on exertion, orthopnea, PND, pedal edema, presyncope, syncope or  exertional chest pain.  He does occasionally have palpitations that are  described as a skip.  These do not appear to be sustained, and there  are no associated symptoms.  His most recent echocardiogram was  performed on August 03, 2006, interpreted by Dr. Sherlyn Lick in [place].  The patient was found to have an ejection fraction of 35-40%.  There was  akinesis of the distal septum, the apex, and the distal half of the  inferior wall.  There was physiologic tricuspid regurgitation with  moderate to severe pulmonary hypertension.  There was mild dilatation of  the aortic root measuring 3.9 cm.  Because of his coronary disease, we  were asked to further evaluate.   MEDICATIONS:  1. Insulin.  2. Glyburide 5/500 daily.  3. Coreg 6.25 mg p.o. b.i.d.  4. Digoxin 0.25 mg p.o. daily.  5. Nexium 40 mg p.o. daily.  6. Lisinopril 5 mg p.o. daily.  7. Mobic 7.5 mg p.o. daily.  8. Lipitor 10 mg p.o. daily.  9. Aspirin 325 mg p.o. daily.  10.Temazepam 15 mg p.o. q.h.s.  11.Meclizine as needed.   ALLERGIES:  No known drug allergies.   SOCIAL  HISTORY:  He does not smoke, nor does he consume alcohol.  He is  a former Automotive engineer.   PAST MEDICAL HISTORY:  1. Diabetes mellitus.  2. Hypertension.  3. Hyperlipidemia.  4. He has a history of coronary disease as outlined in the HPI.  5. He has a history of peptic ulcer disease.  6. He has a history of rheumatoid arthritis.  7. He has had a prior hernia repair.  8. He has had a previous cancer removed from his back.   FAMILY HISTORY:  Positive for coronary artery disease in both his  brother and his sister.   REVIEW OF SYSTEMS:  He denies any headaches, fevers or chills.  There is  no productive cough or hemoptysis.  There is no dysphagia, odynophagia,  melena or hematochezia.  There is no dysuria or hematuria.  There is no  rash or seizure activity.  There is no orthopnea, PND or pedal edema.  He does have significant pain in his knees with ambulation due to  arthritis.  There  is no claudication.  The remaining systems are  negative.   PHYSICAL EXAMINATION:  VITAL SIGNS:  His exam today shows a blood  pressure of 151/75 and his pulse is 74.  He weighs 193 pounds.  GENERAL:  He is well-developed, well-nourished in no acute distress.  He  does not appear to be depressed.  SKIN:  Warm and dry.  There is no peripheral clubbing.  BACK:  Remarkable for a small previous incision for his cancer removal.  There is no adenopathy appreciated on exam.  HEENT:  Unremarkable with normal eyelids.  NECK:  Supple with a normal upstroke bilaterally, and I cannot  appreciate bruits.  There is no jugular venous distention and no  thyromegaly is noted.  CHEST:  Clear to auscultation.  He is status post previous sternotomy.  LUNGS:  Clear.  CARDIOVASCULAR:  Regular rate and rhythm with normal S1 and S2.  There  are no murmurs, rubs, or gallops noted.  His PMI is non-displaced.  ABDOMEN:  Nontender, nondistended.  Positive bowel sounds.  No  hepatosplenomegaly.  No masses appreciated.   There is no abdominal  bruit.  He has 2+ femoral pulses bilaterally and no bruits.  EXTREMITIES:  No edema, and I cannot palpate cords.  His distal pulses  are 1+ and diminished.  NEUROLOGIC:  Grossly intact.   ELECTROCARDIOGRAM:  Sinus rhythm at a rate of 74.  There is left  anterior fascicular block.  A prior anterior infarction cannot be  excluded.  There is a prior inferior infarction.   DIAGNOSES:  1. Coronary artery disease, status post coronary bypassing graft.  2. Ischemic cardiomyopathy.  3. Diabetes mellitus.  4. Hypertension.  5. Hyperlipidemia.  6. Rheumatoid arthritis.  7. Remote history of peptic ulcer disease.   PLAN:  Mr. Eller presents for evaluation of his coronary disease and  ischemic cardiomyopathy.  His most recent ejection fraction by  echocardiogram is 35-40%.  I will discontinue his digoxin, and we will  increase his Coreg to 12.5 mg p.o. b.i.d.  We will see him back in 3  months and continue to titrate this as tolerated by blood pressure and  pulse with a goal of 25 mg p.o. b.i.d.  We can then increase his  lisinopril if his blood pressure will tolerate.  He will continue on his  aspirin and statin.  Dr. Lorin Picket is following his lipids and liver.  Goal  LDL should be less than 70, given his history of coronary disease.  Of  note, the patient does not smoke, and we discussed the importance of  diet and exercise.  Also, since it has been 8 years since his surgery,  we will plan to proceed with an advancing Myoview for risk  stratification.     Madolyn Frieze [name], MD, [facility]  Electronically Signed    BSC/MedQ  DD: 09/07/2006  DT: 09/07/2006  Job #: [id number]   cc:   Lucila Maine, M.D.

## 2011-01-01 NOTE — Assessment & Plan Note (Signed)
[facility]                            CARDIOLOGY OFFICE NOTE   NAME:Woollard, ABU HEAVIN                        MRN:          [id number]  DATE:12/19/2006                            DOB:          1934/12/27    Mr. Coles is a very pleasant 76 year old male who I initially saw on  September 07, 2006. He has a history of coronary disease status post  coronary artery bypass graft, ischemic cardiomyopathy, diabetes,  hypertension, hyperlipidemia. When I saw him we scheduled him to have a  Myoview which was performed on September 22, 2006. His ejection fraction  was found to be severely reduced at 26%. He had had a moderate to large  prior apical and inferior infarct, but there was no ischemia noted.  There was also left ventricular enlargement. We referred him to Dr.  Graciela Husbands for consideration of an ICD and this was implanted in April. Since  then, he has had minimal dyspnea on exertion, but there is no orthopnea,  PND, pedal edema, palpitations, pre-syncope or syncope or chest pain. He  has had no fevers or chills.   CURRENT MEDICATIONS:  1. Insulin.  2. Glyburide 5/500 mg tablets one p.o. daily.  3. Nexium 40 mg p.o. daily.  4. Lisinopril 5 mg p.o. daily.  5. Mobic 7.5 mg p.o. daily.  6. Lipitor 10 mg p.o. daily.  7. Aspirin 325 mg p.o. daily.  8. Temazepam 15 mg p.o. q nightly.  9. Meclizine p.r.n.  10.Coreg 25 mg p.o. b.i.d.   PHYSICAL EXAMINATION:  Shows a blood pressure of 132/78, pulse 67.  NECK: Supple, with no bruits.  CHEST: Clear.  CARDIOVASCULAR: Reveals a regular rate and rhythm. His ICD site is  without evidence of infection and there is no hematoma.  EXTREMITIES: Show no edema.   His electrocardiogram shows a sinus rhythm at a rate of 67. There is  right axis deviation. There are nonspecific ST changes.   DIAGNOSES:  1. Coronary artery disease, status post coronary artery bypass graft:      We will continue with his aspirin, statin as well  as Coreg. We will      also continue with his ACE inhibitor.  2. Ischemic cardiomyopathy:  We will continue with his present dose of      Coreg and I will increase his lisinopril to 10 mg p.o. daily. We      will check a BMET in one week to follow his potassium and renal      function. We will continue to increase this as tolerated by blood      pressure.  3. Diabetes mellitus:  Per primary care.  4. Abnormal chest x-ray:  We will check a chest x-ray today including      nipple markers, although this appeared to be a nipple shadow in the      past.  5. Hypertension: His blood pressure is well-controlled on his present      medications.  6. Hyperlipidemia:  We will check lipids and liver and adjust with a      goal LDL  of less than 70 given his history of coronary disease.  7. Rheumatoid arthritis.  8. Remote history of peptic disease.  9. Status post implantable cardioverter defibrillator (ICD): He will      followup with Dr.  Graciela Husbands for evaluation of his implantable      cardioverter defibrillator (ICD).   He will continue with risk factor modification and we will see him back  in six months.     Madolyn Frieze [name], MD, [facility]  Electronically Signed    BSC/MedQ  DD: 12/19/2006  DT: 12/19/2006  Job #: [id number]   cc:   Lucila Maine, MD
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
## 2011-01-01 NOTE — Op Note (Signed)
[name], [name] NO.:  [id number]   MEDICAL RECORD NO.:  [id number]          PATIENT TYPE:  INP   LOCATION:  2853                         FACILITY:  [facility]   PHYSICIAN:  Duke Salvia, MD, FACCDATE OF BIRTH:  1934-09-04   DATE OF PROCEDURE:  11/29/2006  DATE OF DISCHARGE:                               OPERATIVE REPORT   PREOPERATIVE DIAGNOSES:  Ischemic cardiomyopathy, depressed left  ventricular function, class 2 symptoms with narrow QRS.   POSTOPERATIVE DIAGNOSES:  Ischemic cardiomyopathy, depressed left  ventricular function, class 2 symptoms with narrow QRS.   PROCEDURE:  Implantable cardioverter/defibrillator device implantation  with intraoperative defibrillation threshold testing.   DESCRIPTION OF PROCEDURE:  Following the obtaining of informed consent,  the patient was brought to the electrophysiology laboratory and placed  on the fluoroscopic table in supine position.  After routine prep and  drape of the left upper chest, lidocaine was infiltrated in the  prepectoral/subclavicular region.  An incision was made and carried down  to the layer of the prepectoral fascia using electrocautery and sharp  dissection.  A pocket was formed similarly.  Hemostasis was obtained.   Attention was then turned to gaining access to the extrathoracic left  subclavian vein, which was accomplished without difficulty and without  the aspiration of air or puncture of the artery.  A guide wire was  placed and retained, and a 9-French sheath was placed, through which was  passed a Guidant 0158 dual-coil active-fixation defibrillator lead,  serial [id number].  This lead was manipulated to the right ventricular  apex, where the bipolar R wave was 10.4, with a pacing impedance of 559,  a threshold of 0.7 at 0.5, and current at threshold was 1.3 mA.  There  was no diaphragmatic pacing at 10 V.  This lead was secured to the  prepectoral fascia and then attached to a Guidant  Vitality 2 ICD T175,  serial [id number].  Through the device, the bipolar R wave was 13.9, with a  pacing impedance of 570, a threshold of 0.6 V at 0.5 msec, and high-  voltage impedance was 33 ohms.  I should note that the current of injury  was quite brisk.   At this point, defibrillation threshold testing was undertaken.  Ventricular fibrillation was induced via the T-wave shock.  After a  total duration of 6.5 seconds, a 14-J shock was delivered through a  measured resistance of 33 ohms, terminating ventricular fibrillation and  restoring sinus rhythm.   At this point, the device was implanted.  The pocket was copiously  irrigated with antibiotic-containing saline solution.  Hemostasis was  assured, and the lead and the pulse generator were placed in the pocket  and secured to the prepectoral fascia.  The wound was closed in 3 layers  in the normal fashion.  The wound was washed and dried, and  a benzoin and Steri-Strips dressing was applied.  Needle counts, sponge  counts and instrument counts were correct at the end of the procedure  according to the staff.  The patient tolerated the procedure without  apparent complication.  Duke Salvia, MD, [facility]  Electronically Signed     SCK/MEDQ  D:  11/29/2006  T:  11/29/2006  Job:  [id number]   cc:   Arta Silence, MD  Electrophysiology [facility]

## 2011-01-01 NOTE — Discharge Summary (Signed)
[name], Raymond NO.:  [id number]   MEDICAL RECORD NO.:  [id number]          PATIENT TYPE:  INP   LOCATION:  2021                         FACILITY:  [facility]   PHYSICIAN:  Maple Mirza, PA   DATE OF BIRTH:  Jun 04, 1935   DATE OF ADMISSION:  11/29/2006  DATE OF DISCHARGE:  11/30/2006                               DISCHARGE SUMMARY   This dictation greater than 40 minutes with the exam included.   ALLERGIES:  PENICILLIN.   PRINCIPAL DIAGNOSES:  1. Discharging day 1 status post implant guidant Vitality 2      cardioverter defibrillator threshold study less than or equal to 21      Joules.  2. Ischemic cardiomyopathy.  Ejection fraction 26% on Myoview study,      March 2008.      a.     History of myocardial infarction in 2000.      b.     Three-vessel coronary artery disease, status post coronary       artery bypass graft surgery.      c.     Moderate myocardial scarring.  3. Qualifies by way of MADIT II for ICD.   SECONDARY DIAGNOSES:  1. Hypertension.  2. Diabetes.  3. Rheumatoid arthritis, limits activity.  4. Class II __________ association chronic, systolic congestive heart      failure.   PROCEDURE:  November 29, 2006:  Implant guidant Vitality 2 cardioverter  defibrillator, Dr. Sherryl Manges.  The patient had no post-procedure  complications, a little sore at the incision.  The devise has been  interrogated.  All values within normal limits.  Chest x-ray shows that  the cardioverter lead is in appropriate position.  The incision has no  hematoma.  Incision care and mobility of the left arm have been  discussed with the patient.   DISCHARGE MEDICATIONS:  He discharges April 16 on the following  medications:  1. Coreg 25 mg twice daily.  This is a new dose of Coreg.  2. Lantus per sliding scale, which the patient follows at home.  3. Glyburide/metformin 5/500 one tab daily.  4. Nexium 40 mg daily.  5. Lisinopril 5 mg daily.  6. Mobic 7.5 mg  daily.  7. Lipitor 10 mg daily at bedtime.  8. Enteric coated aspirin 325 mg daily.  9. A new medicine is Darvocet-N 100 1-2 tabs every 4-6 hours as needed      for pain.   DISCHARGE INSTRUCTIONS:  He is to keep his incision dry for the next 7  days.  To sponge bathe until Tuesday, April 22.   FOLLOWUP:  1. [facility], Dr. [name], Monday, May 5 at 2:15.  2. He sees Dr. Graciela Husbands Tuesday, August 12 at 2:00.   BRIEF HISTORY:  [name] is a 76 year old male.  He has a history of  coronary artery disease.  He is status post coronary artery bypass graft  surgery.  He has myocardial scarring, ischemic cardiomyopathy.  His  ejection fraction is 35-40%.  A recent Myoview study showed ejection  fraction 26%.  He  has a history of previous myocardial infarction that  qualifies him as MADIT II guidelines for cardioverter defibrillator.  This was planned for implantation November 29, 2006.   Subsidiary of medical history includes dyslipidemia, diabetes,  rheumatoid arthritis, moderate to severe pulmonary hypertension, and an  allergy to penicillin.   LABORATORY STUDIES:  Serum electrolytes:  Sodium 138, potassium 4,  chloride 105, carbonate 24, glucose 95, BUN is 18, creatinine is 1.08.      Maple Mirza, PA     GM/MEDQ  D:  11/30/2006  T:  11/30/2006  Job:  [id number]   cc:   Duke Salvia, MD, [facility]  Madolyn Frieze [name], MD, [facility]

## 2011-01-09 ENCOUNTER — Telehealth: Payer: Self-pay | Admitting: Adult Health

## 2011-01-10 NOTE — Telephone Encounter (Signed)
Fu when with Dr. Graciela Husbands when he returns from [place]. Olga Millers

## 2011-01-18 NOTE — Telephone Encounter (Signed)
SPOKE WITH PT WILL CALL AND SCHEDULE APPT WHEN RETURNS HOME  .[name]

## 2011-01-28 ENCOUNTER — Encounter: Payer: Self-pay | Admitting: Internal Medicine

## 2011-01-29 ENCOUNTER — Encounter: Payer: Self-pay | Admitting: Internal Medicine

## 2011-01-29 ENCOUNTER — Ambulatory Visit (INDEPENDENT_AMBULATORY_CARE_PROVIDER_SITE_OTHER): Payer: Medicare PPO | Admitting: Internal Medicine

## 2011-01-29 VITALS — BP 122/60 | HR 76 | Ht 70.0 in | Wt 210.0 lb

## 2011-01-29 DIAGNOSIS — I2589 Other forms of chronic ischemic heart disease: Secondary | ICD-10-CM

## 2011-01-29 DIAGNOSIS — I5023 Acute on chronic systolic (congestive) heart failure: Secondary | ICD-10-CM

## 2011-01-29 DIAGNOSIS — I428 Other cardiomyopathies: Secondary | ICD-10-CM

## 2011-01-29 DIAGNOSIS — I472 Ventricular tachycardia: Secondary | ICD-10-CM

## 2011-01-29 MED ORDER — FUROSEMIDE 20 MG PO TABS: ORAL_TABLET | ORAL | Status: DC

## 2011-01-29 NOTE — Assessment & Plan Note (Signed)
Stable on current medications 

## 2011-01-29 NOTE — Assessment & Plan Note (Signed)
Initial episode of VT. No impaired consciousness. He is now 6 weeks or so post. No limitations or driving.

## 2011-01-29 NOTE — Patient Instructions (Signed)
Your physician has recommended you make the following change in your medication:  1) Start lasix (furosemide) 20mg  one by mouth twice daily for 4 days, then one tablet once daily   Your physician recommends that you return for lab work in: 1 week- bmet/bnp (606.30)  Your physician recommends that you schedule a follow-up appointment in: 4 weeks with Dr. [name].

## 2011-01-29 NOTE — Assessment & Plan Note (Signed)
Patient has increasing shortness of breath and evidence of falling overload. He is not a salt avoid her in his diet. We have reviewed this. We also put her on a low dose diuretic. 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 And we will not start him on potassium at this time and will check his metabolic profile in one week's time

## 2011-01-29 NOTE — Progress Notes (Signed)
  HPI  [name] is a 76 y.o. male Seen in followup for an ICD implanted implanted for primary prevention he has a history of ischemic heart disease and underwent Myoview scanning in February demonstrated no ischemia and an ejection fraction of 26%. There is a large scar.  In May while working on his house in [place] he was shocked. Interrogation of his device demonstrates ventricular tachycardia. Since that time he has had problems with increasing shortness of breath. He had mild peripheral edema. He has had no significant orthopnea or nocturnal dyspnea.  He has significant arthritis and has recently been exposed to oral As well as injected steroids. He is on long-term nonsteroidals.  Past Medical History  Diagnosis Date  . Other and unspecified hyperlipidemia   . Unspecified essential hypertension   . Coronary atherosclerosis of unspecified type of vessel, native or graft   . Other primary cardiomyopathies   . Congestive heart failure, unspecified   . Rheumatoid arthritis   . Type II or unspecified type diabetes mellitus without mention of complication, not stated as uncontrolled   . Peptic ulcer, unspecified site, unspecified as acute or chronic, without mention of hemorrhage, perforation, or obstruction     Past Surgical History  Procedure Date  . Decomppression (other)     left median nerve  . Coronary artey bypass graft     status post  . Cardiac defibrillator placement     status post implantable    Current Outpatient Prescriptions  Medication Sig Dispense Refill  . aspirin EC 325 MG tablet Take 325 mg by mouth daily.        Marland Kitchen atorvastatin (LIPITOR) 10 MG tablet Take 10 mg by mouth daily.        . carvedilol (COREG) 25 MG tablet Take 25 mg by mouth 2 (two) times daily.        Marland Kitchen esomeprazole (NEXIUM) 40 MG capsule Take 40 mg by mouth daily.        . Glucosamine-Chondroit-Vit C-Mn (GLUCOSAMINE-CHONDROITIN) CAPS Take by mouth daily.        Marland Kitchen glyBURIDE-metformin  (GLUCOVANCE) 5-500 MG per tablet Take 1 tablet by mouth daily.        . insulin glargine (LANTUS) 100 UNIT/ML injection Inject into the skin as directed.        Marland Kitchen lisinopril (PRINIVIL,ZESTRIL) 10 MG tablet Take 10 mg by mouth daily.        . meloxicam (MOBIC) 7.5 MG tablet Take 7.5 mg by mouth 3 (three) times daily.        . niacin (NIASPAN) 1000 MG CR tablet Take 1,000 mg by mouth at bedtime.        . furosemide (LASIX) 20 MG tablet Take two tablets by mouth for 4 days, then take one tablet daily after that  30 tablet  6    Allergies no known allergies  Review of Systems negative except from HPI and PMH  Physical Exam Well developed and well nourished in no acute distress HENT normal E scleral and icterus clear Neck Supple JVP 7-8 cm with positive HJR; carotids brisk and full Clear to ausculation Regular rate and rhythm, no murmurs gallops or rub Soft with active bowel sounds No clubbing cyanosis 1-2+ bilateral peripheral edema Alert and oriented, grossly normal motor and sensory function Skin Warm and Dry    Assessment and  Plan

## 2011-02-05 ENCOUNTER — Other Ambulatory Visit (INDEPENDENT_AMBULATORY_CARE_PROVIDER_SITE_OTHER): Payer: Medicare PPO | Admitting: *Deleted

## 2011-02-05 DIAGNOSIS — R0602 Shortness of breath: Secondary | ICD-10-CM

## 2011-02-05 DIAGNOSIS — I428 Other cardiomyopathies: Secondary | ICD-10-CM

## 2011-02-05 LAB — BASIC METABOLIC PANEL
Creatinine, Ser: 1.1 mg/dL (ref 0.4–1.5)
GFR: 70.3 mL/min (ref >60.00)
Glucose, Bld: 146 mg/dL — ABNORMAL HIGH (ref 70–99)
Potassium: 4.4 mEq/L (ref 3.5–5.1)

## 2011-02-18 ENCOUNTER — Encounter: Payer: Self-pay | Admitting: *Deleted

## 2011-02-19 ENCOUNTER — Ambulatory Visit (INDEPENDENT_AMBULATORY_CARE_PROVIDER_SITE_OTHER): Payer: Medicare PPO | Admitting: Cardiology

## 2011-02-19 ENCOUNTER — Encounter: Payer: Self-pay | Admitting: Cardiology

## 2011-02-19 VITALS — BP 107/60 | HR 77 | Resp 18 | Ht 70.0 in | Wt 211.1 lb

## 2011-02-19 DIAGNOSIS — I2589 Other forms of chronic ischemic heart disease: Secondary | ICD-10-CM

## 2011-02-19 DIAGNOSIS — I509 Heart failure, unspecified: Secondary | ICD-10-CM

## 2011-02-19 DIAGNOSIS — I10 Essential (primary) hypertension: Secondary | ICD-10-CM

## 2011-02-19 DIAGNOSIS — E785 Hyperlipidemia, unspecified: Secondary | ICD-10-CM

## 2011-02-19 DIAGNOSIS — I2510 Atherosclerotic heart disease of native coronary artery without angina pectoris: Secondary | ICD-10-CM

## 2011-02-19 DIAGNOSIS — I472 Ventricular tachycardia: Secondary | ICD-10-CM

## 2011-02-19 LAB — BASIC METABOLIC PANEL WITH GFR
CO2: 25 mEq/L (ref 19–32)
Calcium: 9.1 mg/dL (ref 8.4–10.5)
Chloride: 99 mEq/L (ref 96–112)
Creat: 1.08 mg/dL (ref 0.50–1.35)
GFR, Est African American: >60 mL/min (ref >60)
GFR, Est Non African American: >60 mL/min (ref >60)
Sodium: 134 mEq/L — ABNORMAL LOW (ref 135–145)

## 2011-02-19 MED ORDER — FUROSEMIDE 20 MG PO TABS: 20.0000 mg | ORAL_TABLET | Freq: Every day | ORAL | Status: DC

## 2011-02-19 NOTE — Progress Notes (Signed)
Addended by: Alma Friendly on: 02/19/2011 03:51 PM   Modules accepted: Orders

## 2011-02-19 NOTE — Assessment & Plan Note (Signed)
Patient had appropriate discharge of his ICD for ventricular tachycardia. If these episodes become more frequent then we will need to consider an antiarrhythmic. He will followup with EP

## 2011-02-19 NOTE — Progress Notes (Signed)
Addended by: Alma Friendly on: 02/19/2011 03:50 PM   Modules accepted: Orders

## 2011-02-19 NOTE — Assessment & Plan Note (Signed)
Continue beta blocker and ACE inhibitor. Continue Lasix at 20 mg daily. Check potassium and renal function. We may need to reduce her Lasix dose if BUN increases further.

## 2011-02-19 NOTE — Progress Notes (Signed)
HPI: Mr. Stowers is a pleasant gentleman who has a history of coronary disease, status post coronary artery bypassing graft, ischemic cardiomyopathy, hypertension, diabetes, and hyperlipidemia.  His most recent Myoview was performed in Jan 2012.  At that time, his ejection fraction was 31%.  There was prior apical, inferoseptal and inferior infarction, but there was no ischemia.  He also has had a previous defibrillator placed.  I last saw him in Oct of 2011. Since then in May when he was visiting in [place] his defibrillator fired. There was no preceding dyspnea, chest pain or palpitations. He was seen by Dr. Graciela Husbands and the device showed ventricular tachycardia. At that time he also was noted to have increased dyspnea on exertion and pedal edema. Lasix 20 mg daily was added. His edema has improved. He is not having chest pain, dyspnea or syncope at present.  Current Outpatient Prescriptions  Medication Sig Dispense Refill  . aspirin EC 325 MG tablet Take 325 mg by mouth daily.        Marland Kitchen atorvastatin (LIPITOR) 10 MG tablet Take 10 mg by mouth daily.        . carvedilol (COREG) 25 MG tablet Take 25 mg by mouth 2 (two) times daily.        Marland Kitchen esomeprazole (NEXIUM) 40 MG capsule Take 40 mg by mouth daily.        . furosemide (LASIX) 20 MG tablet Take by mouth daily.        . Glucosamine-Chondroit-Vit C-Mn (GLUCOSAMINE-CHONDROITIN) CAPS Take by mouth daily.        Marland Kitchen glyBURIDE-metformin (GLUCOVANCE) 5-500 MG per tablet Take 1 tablet by mouth daily.        . insulin glargine (LANTUS) 100 UNIT/ML injection Inject into the skin as directed.        Marland Kitchen lisinopril (PRINIVIL,ZESTRIL) 10 MG tablet Take 10 mg by mouth daily.        . niacin (NIASPAN) 1000 MG CR tablet Take 1,000 mg by mouth at bedtime.        Marland Kitchen DISCONTD: furosemide (LASIX) 20 MG tablet Take two tablets by mouth for 4 days, then take one tablet daily after that  30 tablet  6  . meloxicam (MOBIC) 7.5 MG tablet Take 7.5 mg by mouth 3 (three) times daily.            Past Medical History  Diagnosis Date  . Other and unspecified hyperlipidemia   . Unspecified essential hypertension   . Coronary atherosclerosis of unspecified type of vessel, native or graft   . Other primary cardiomyopathies   . Congestive heart failure, unspecified   . Rheumatoid arthritis   . Type II or unspecified type diabetes mellitus without mention of complication, not stated as uncontrolled   . Peptic ulcer, unspecified site, unspecified as acute or chronic, without mention of hemorrhage, perforation, or obstruction     Past Surgical History  Procedure Date  . Decomppression (other)     left median nerve  . Coronary artey bypass graft     status post  . Cardiac defibrillator placement     status post implantable    History   Social History  . Marital Status: Married    Spouse Name: N/A    Number of Children: N/A  . Years of Education: N/A   Occupational History  . Not on file.   Social History Main Topics  . Smoking status: Never Smoker   . Smokeless tobacco: Not on file  . Alcohol Use:  No  . Drug Use: No  . Sexually Active:    Other Topics Concern  . Not on file   Social History Narrative   Married. Has 1 surviving child and 4 surviving grandchildren.     ROS: no fevers or chills, productive cough, hemoptysis, dysphasia, odynophagia, melena, hematochezia, dysuria, hematuria, rash, seizure activity, orthopnea, PND, pedal edema, claudication. Remaining systems are negative.  Physical Exam: Well-developed well-nourished in no acute distress.  Skin is warm and dry.  HEENT is normal.  Neck is supple. No thyromegaly.  Chest is clear to auscultation with normal expansion. ICD left chest Cardiovascular exam is regular rate and rhythm.  Abdominal exam nontender or distended. No masses palpated. Extremities show trace edema. neuro grossly intact  ECG Normal sinus rhythm at a rate of 73. Prior septal and anterior infarct. Lateral T-wave  inversion.

## 2011-02-19 NOTE — Assessment & Plan Note (Signed)
Blood pressure control with present medications. Will continue. 

## 2011-02-19 NOTE — Patient Instructions (Signed)
Your physician wants you to follow-up in: 6 MONTHS You will receive a reminder letter in the mail two months in advance. If you don't receive a letter, please call our office to schedule the follow-up appointment. 

## 2011-02-19 NOTE — Assessment & Plan Note (Signed)
Continue statin. Lipids and liver monitored by primary care. 

## 2011-02-19 NOTE — Assessment & Plan Note (Signed)
Continue aspirin and statin. Last Myoview low risk. 

## 2011-04-06 ENCOUNTER — Other Ambulatory Visit: Payer: Self-pay | Admitting: Cardiology

## 2011-04-06 ENCOUNTER — Other Ambulatory Visit: Payer: Self-pay

## 2011-04-06 MED ORDER — CARVEDILOL 25 MG PO TABS: 25.0000 mg | ORAL_TABLET | Freq: Two times a day (BID) | ORAL | Status: DC

## 2011-04-06 NOTE — Telephone Encounter (Signed)
Pt needs carvedilol 25mg  bid [facility]

## 2011-05-06 ENCOUNTER — Other Ambulatory Visit: Payer: Self-pay | Admitting: Internal Medicine

## 2011-05-06 ENCOUNTER — Encounter: Payer: Self-pay | Admitting: Internal Medicine

## 2011-05-06 ENCOUNTER — Ambulatory Visit (INDEPENDENT_AMBULATORY_CARE_PROVIDER_SITE_OTHER): Payer: Medicare PPO | Admitting: *Deleted

## 2011-05-06 DIAGNOSIS — I255 Ischemic cardiomyopathy: Secondary | ICD-10-CM | POA: Insufficient documentation

## 2011-05-06 DIAGNOSIS — I5023 Acute on chronic systolic (congestive) heart failure: Secondary | ICD-10-CM

## 2011-05-06 DIAGNOSIS — I428 Other cardiomyopathies: Secondary | ICD-10-CM

## 2011-05-08 LAB — REMOTE ICD DEVICE
BATTERY VOLTAGE: 2.64 V
BRDY-0002RV: 40 {beats}/min
CHARGE TIME: 13.8 s
DEVICE MODEL ICD: 128830
DEVICE MODEL ICD: 128830
FVT: 0
FVT: 0
HV IMPEDENCE: 39 Ohm
PACEART VT: 1
PACEART VT: 1
RV LEAD IMPEDENCE ICD: 524 Ohm
TOT-0006: 20120405000000
TZAT-0001FASTVT: 1
TZAT-0001SLOWVT: 1
TZAT-0002FASTVT: NEGATIVE
TZAT-0002FASTVT: NEGATIVE
TZAT-0013FASTVT: 1
TZAT-0013SLOWVT: 2
TZAT-0013SLOWVT: 2
TZAT-0018FASTVT: NEGATIVE
TZAT-0018SLOWVT: NEGATIVE
TZAT-0018SLOWVT: NEGATIVE
TZON-0003FASTVT: 300 ms
TZON-0003FASTVT: 300 ms
TZON-0003SLOWVT: 352.9 ms
TZON-0003SLOWVT: 352.9 ms
TZST-0001FASTVT: 4
TZST-0001FASTVT: 5
TZST-0001FASTVT: 7
TZST-0001SLOWVT: 3
TZST-0001SLOWVT: 4
TZST-0001SLOWVT: 4
TZST-0001SLOWVT: 5
TZST-0001SLOWVT: 5
TZST-0001SLOWVT: 6
TZST-0001SLOWVT: 7
TZST-0003FASTVT: 23 J
TZST-0003FASTVT: 31 J
TZST-0003FASTVT: 31 J
TZST-0003SLOWVT: 17 J
TZST-0003SLOWVT: 23 J
TZST-0003SLOWVT: 31 J
TZST-0003SLOWVT: 31 J
TZST-0003SLOWVT: 31 J

## 2011-05-14 NOTE — Progress Notes (Signed)
icd checked by remote

## 2011-05-17 LAB — CBC
HCT: 37.5 — ABNORMAL LOW
Hemoglobin: 12.5 — ABNORMAL LOW
MCV: 85
WBC: 5.9

## 2011-05-17 LAB — BASIC METABOLIC PANEL
Chloride: 104
GFR calc non Af Amer: >60
Potassium: 4.7
Sodium: 138

## 2011-05-19 ENCOUNTER — Encounter: Payer: Self-pay | Admitting: *Deleted

## 2011-06-29 ENCOUNTER — Telehealth: Payer: Self-pay | Admitting: Cardiology

## 2011-06-29 MED ORDER — NIACIN ER (ANTIHYPERLIPIDEMIC) 1000 MG PO TBCR: 1000.0000 mg | EXTENDED_RELEASE_TABLET | Freq: Every day | ORAL | Status: DC

## 2011-06-29 NOTE — Telephone Encounter (Signed)
Pt wants refill of niaspan 1000 mg he wants 90 day supply with 3 refills  [facility]

## 2011-08-05 ENCOUNTER — Encounter: Payer: Medicare PPO | Admitting: *Deleted

## 2011-08-06 ENCOUNTER — Other Ambulatory Visit: Payer: Self-pay | Admitting: Internal Medicine

## 2011-08-06 ENCOUNTER — Encounter: Payer: Self-pay | Admitting: Internal Medicine

## 2011-08-06 ENCOUNTER — Ambulatory Visit (INDEPENDENT_AMBULATORY_CARE_PROVIDER_SITE_OTHER): Payer: Medicare PPO | Admitting: *Deleted

## 2011-08-06 DIAGNOSIS — I472 Ventricular tachycardia: Secondary | ICD-10-CM

## 2011-08-11 LAB — REMOTE ICD DEVICE
DEV-0020ICD: NEGATIVE
DEVICE MODEL ICD: 128830
FVT: 0
HV IMPEDENCE: 38 Ohm
PACEART VT: 0
RV LEAD AMPLITUDE: 12.4 mv
TZAT-0001FASTVT: 1
TZAT-0001SLOWVT: 1
TZAT-0002FASTVT: NEGATIVE
TZAT-0013SLOWVT: 2
TZAT-0018FASTVT: NEGATIVE
TZAT-0018SLOWVT: NEGATIVE
TZON-0003FASTVT: 300 ms
TZST-0001FASTVT: 5
TZST-0001FASTVT: 6
TZST-0001SLOWVT: 3
TZST-0001SLOWVT: 4
TZST-0003FASTVT: 23 J
TZST-0003SLOWVT: 17 J
TZST-0003SLOWVT: 31 J
TZST-0003SLOWVT: 31 J
VENTRICULAR PACING ICD: 0 pct

## 2011-08-11 NOTE — Progress Notes (Signed)
ICD remote 

## 2011-08-24 ENCOUNTER — Ambulatory Visit (INDEPENDENT_AMBULATORY_CARE_PROVIDER_SITE_OTHER): Payer: Medicare PPO | Admitting: Cardiology

## 2011-08-24 ENCOUNTER — Encounter: Payer: Self-pay | Admitting: Cardiology

## 2011-08-24 DIAGNOSIS — Z95810 Presence of automatic (implantable) cardiac defibrillator: Secondary | ICD-10-CM

## 2011-08-24 DIAGNOSIS — I5023 Acute on chronic systolic (congestive) heart failure: Secondary | ICD-10-CM

## 2011-08-24 DIAGNOSIS — I2510 Atherosclerotic heart disease of native coronary artery without angina pectoris: Secondary | ICD-10-CM

## 2011-08-24 DIAGNOSIS — I2589 Other forms of chronic ischemic heart disease: Secondary | ICD-10-CM

## 2011-08-24 DIAGNOSIS — E785 Hyperlipidemia, unspecified: Secondary | ICD-10-CM

## 2011-08-24 DIAGNOSIS — I10 Essential (primary) hypertension: Secondary | ICD-10-CM

## 2011-08-24 NOTE — Assessment & Plan Note (Signed)
Blood pressure controlled. Continue present medications. 

## 2011-08-24 NOTE — Assessment & Plan Note (Signed)
Management per electrophysiology. 

## 2011-08-24 NOTE — Assessment & Plan Note (Signed)
Continue statin. 

## 2011-08-24 NOTE — Assessment & Plan Note (Signed)
Continue aspirin and statin. Last Myoview showed no ischemia. 

## 2011-08-24 NOTE — Assessment & Plan Note (Signed)
Patient euvolemic on examination. Continue present medications. 

## 2011-08-24 NOTE — Progress Notes (Signed)
HPI:[name] is a pleasant gentleman who has a history of coronary disease, status post coronary artery bypassing graft, ischemic cardiomyopathy, hypertension, diabetes, and hyperlipidemia. His most recent Myoview was performed in Jan 2012. At that time, his ejection fraction was 31%. There was prior apical, inferoseptal and inferior infarction, but there was no ischemia. He also has had a previous defibrillator placed. I last saw him in July of 2012. Since then, the patient has dyspnea with more extreme activities but not with routine activities. It is relieved with rest. It is not associated with chest pain. There is no orthopnea, PND or pedal edema. There is no syncope or palpitations. There is no exertional chest pain.     Current Outpatient Prescriptions  Medication Sig Dispense Refill  . aspirin EC 325 MG tablet Take 325 mg by mouth daily.        Marland Kitchen atorvastatin (LIPITOR) 10 MG tablet Take 10 mg by mouth daily.        . carvedilol (COREG) 25 MG tablet Take 1 tablet (25 mg total) by mouth 2 (two) times daily.  60 tablet  6  . esomeprazole (NEXIUM) 40 MG capsule Take 40 mg by mouth daily.        . furosemide (LASIX) 20 MG tablet Take 1 tablet (20 mg total) by mouth daily.  90 tablet  4  . glyBURIDE-metformin (GLUCOVANCE) 5-500 MG per tablet Take 1 tablet by mouth daily.        . insulin glargine (LANTUS) 100 UNIT/ML injection Inject into the skin as directed.        Marland Kitchen lisinopril (PRINIVIL,ZESTRIL) 10 MG tablet Take 10 mg by mouth daily.        . meloxicam (MOBIC) 7.5 MG tablet Take 7.5 mg by mouth 3 (three) times daily. As needed      . niacin (NIASPAN) 1000 MG CR tablet Take 1 tablet (1,000 mg total) by mouth at bedtime.  90 tablet  3     Past Medical History  Diagnosis Date  . Other and unspecified hyperlipidemia   . Unspecified essential hypertension   . Coronary atherosclerosis of unspecified type of vessel, native or graft   . Other primary cardiomyopathies   . Congestive heart  failure, unspecified   . Rheumatoid arthritis   . Type II or unspecified type diabetes mellitus without mention of complication, not stated as uncontrolled   . Peptic ulcer, unspecified site, unspecified as acute or chronic, without mention of hemorrhage, perforation, or obstruction   . Ischemic cardiomyopathy     Past Surgical History  Procedure Date  . Decomppression (other)     left median nerve  . Coronary artey bypass graft     status post  . Cardiac defibrillator placement     status post implantable    History   Social History  . Marital Status: Married    Spouse Name: N/A    Number of Children: N/A  . Years of Education: N/A   Occupational History  . Not on file.   Social History Main Topics  . Smoking status: Never Smoker   . Smokeless tobacco: Not on file  . Alcohol Use: No  . Drug Use: No  . Sexually Active:    Other Topics Concern  . Not on file   Social History Narrative   Married. Has 1 surviving child and 4 surviving grandchildren.     ROS: Significant knee arthralgias but no fevers or chills, productive cough, hemoptysis, dysphasia, odynophagia, melena, hematochezia,  dysuria, hematuria, rash, seizure activity, orthopnea, PND, pedal edema, claudication. Remaining systems are negative.  Physical Exam: Well-developed well-nourished in no acute distress.  Skin is warm and dry.  HEENT is normal.  Neck is supple. No thyromegaly.  Chest is clear to auscultation with normal expansion.  Cardiovascular exam is regular rate and rhythm.  Abdominal exam nontender or distended. No masses palpated. Extremities show no edema. neuro grossly intact  ECG sinus rhythm with sinus arrhythmia. Prior inferior infarct. Cannot rule out prior anterior infarct. Nonspecific T-wave changes.

## 2011-08-24 NOTE — Patient Instructions (Signed)
Your physician wants you to follow-up in: ONE YEAR You will receive a reminder letter in the mail two months in advance. If you don't receive a letter, please call our office to schedule the follow-up appointment.  

## 2011-08-24 NOTE — Assessment & Plan Note (Signed)
Continue beta blocker and ACE inhibitor. ICD in place.

## 2011-08-31 ENCOUNTER — Other Ambulatory Visit: Payer: Self-pay | Admitting: Cardiology

## 2011-08-31 NOTE — Telephone Encounter (Signed)
Refill   Patient also wants to know if he can get the carvedilol (COREG) 25 MG tablet In a 90 supply.

## 2011-09-01 MED ORDER — CARVEDILOL 25 MG PO TABS: 25.0000 mg | ORAL_TABLET | Freq: Two times a day (BID) | ORAL | Status: DC

## 2011-09-02 ENCOUNTER — Encounter: Payer: Self-pay | Admitting: *Deleted

## 2011-09-07 ENCOUNTER — Other Ambulatory Visit: Payer: Self-pay | Admitting: Cardiology

## 2011-09-07 NOTE — Telephone Encounter (Signed)
Pt needs a [facility] because a 30 day supply was sent in and 90 day is cheaper and he needs this asap

## 2011-09-08 MED ORDER — CARVEDILOL 25 MG PO TABS: 25.0000 mg | ORAL_TABLET | Freq: Two times a day (BID) | ORAL | Status: DC

## 2011-11-11 ENCOUNTER — Encounter: Payer: Self-pay | Admitting: Internal Medicine

## 2011-11-11 ENCOUNTER — Ambulatory Visit (INDEPENDENT_AMBULATORY_CARE_PROVIDER_SITE_OTHER): Payer: Medicare PPO | Admitting: *Deleted

## 2011-11-11 DIAGNOSIS — I472 Ventricular tachycardia: Secondary | ICD-10-CM

## 2011-11-11 DIAGNOSIS — Z95810 Presence of automatic (implantable) cardiac defibrillator: Secondary | ICD-10-CM

## 2011-11-16 LAB — REMOTE ICD DEVICE
BRDY-0002RV: 40 {beats}/min
CHARGE TIME: 14.1 s
DEV-0020ICD: NEGATIVE
RV LEAD IMPEDENCE ICD: 500 Ohm
TOT-0006: 20121221000000
TZAT-0001FASTVT: 2
TZAT-0001SLOWVT: 2
TZAT-0013FASTVT: 1
TZAT-0013SLOWVT: 2
TZAT-0018FASTVT: NEGATIVE
TZAT-0018SLOWVT: NEGATIVE
TZON-0003SLOWVT: 352.9 ms
TZST-0001FASTVT: 4
TZST-0001FASTVT: 7
TZST-0001SLOWVT: 5
TZST-0001SLOWVT: 6
TZST-0003FASTVT: 23 J
TZST-0003FASTVT: 31 J
TZST-0003FASTVT: 31 J
TZST-0003SLOWVT: 23 J
TZST-0003SLOWVT: 31 J
VF: 0

## 2011-11-17 NOTE — Progress Notes (Signed)
Remote icd check  

## 2011-11-26 ENCOUNTER — Encounter: Payer: Self-pay | Admitting: *Deleted

## 2012-01-28 ENCOUNTER — Encounter: Payer: Self-pay | Admitting: *Deleted

## 2012-02-10 ENCOUNTER — Telehealth: Payer: Self-pay | Admitting: *Deleted

## 2012-02-10 ENCOUNTER — Ambulatory Visit (INDEPENDENT_AMBULATORY_CARE_PROVIDER_SITE_OTHER): Payer: Medicare PPO | Admitting: Internal Medicine

## 2012-02-10 ENCOUNTER — Encounter: Payer: Self-pay | Admitting: Internal Medicine

## 2012-02-10 VITALS — BP 108/60 | HR 84 | Ht 70.0 in | Wt 211.1 lb

## 2012-02-10 DIAGNOSIS — I2589 Other forms of chronic ischemic heart disease: Secondary | ICD-10-CM

## 2012-02-10 DIAGNOSIS — I2510 Atherosclerotic heart disease of native coronary artery without angina pectoris: Secondary | ICD-10-CM

## 2012-02-10 DIAGNOSIS — Z95810 Presence of automatic (implantable) cardiac defibrillator: Secondary | ICD-10-CM

## 2012-02-10 DIAGNOSIS — I5022 Chronic systolic (congestive) heart failure: Secondary | ICD-10-CM

## 2012-02-10 LAB — ICD DEVICE OBSERVATION
BRDY-0002RV: 40 {beats}/min
DEVICE MODEL ICD: 128830
RV LEAD AMPLITUDE: 10.5 mv
RV LEAD IMPEDENCE ICD: 530 Ohm
TZAT-0001FASTVT: 2
TZAT-0001SLOWVT: 2
TZAT-0013FASTVT: 1
TZAT-0013SLOWVT: 2
TZAT-0018FASTVT: NEGATIVE
TZAT-0018SLOWVT: NEGATIVE
TZON-0003SLOWVT: 352.9 ms
TZST-0001FASTVT: 3
TZST-0001FASTVT: 4
TZST-0001FASTVT: 7
TZST-0001SLOWVT: 5
TZST-0001SLOWVT: 7
TZST-0003FASTVT: 23 J
TZST-0003FASTVT: 31 J
TZST-0003FASTVT: 31 J
TZST-0003FASTVT: 31 J
TZST-0003FASTVT: 31 J
TZST-0003SLOWVT: 23 J
TZST-0003SLOWVT: 31 J
VENTRICULAR PACING ICD: 0 pct

## 2012-02-10 NOTE — Assessment & Plan Note (Signed)
euvolemic   Consider ARA

## 2012-02-10 NOTE — Progress Notes (Signed)
  HPI  [name] is a 77 y.o. male Seen in followup for an ICD implanted implanted for primary prevention he has a history of ischemic heart disease and underwent Myoview scanning in February demonstrated no ischemia and an ejection fraction of 26%. There is a large scar.  In May 2012 while working on his house in [place] he was shocked. Interrogation of his device demonstrates ventricular tachycardia.    Past Medical History  Diagnosis Date  . Other and unspecified hyperlipidemia   . Unspecified essential hypertension   . Coronary atherosclerosis of unspecified type of vessel, native or graft   . Ventricular tachycardia     Rx via ICD 5 /2012  . Congestive heart failure, unspecified   . Rheumatoid arthritis   . Type II or unspecified type diabetes mellitus without mention of complication, not stated as uncontrolled   . Peptic ulcer, unspecified site, unspecified as acute or chronic, without mention of hemorrhage, perforation, or obstruction   . Ischemic cardiomyopathy   . ICD (implantable cardiac defibrillator), single BSX     DOI 2008    Past Surgical History  Procedure Date  . Decomppression (other)     left median nerve  . Coronary artey bypass graft     status post  . Cardiac defibrillator placement     status post implantable    Current Outpatient Prescriptions  Medication Sig Dispense Refill  . aspirin EC 325 MG tablet Take 325 mg by mouth daily.        Marland Kitchen atorvastatin (LIPITOR) 10 MG tablet Take 10 mg by mouth daily.        . carvedilol (COREG) 25 MG tablet Take 1 tablet (25 mg total) by mouth 2 (two) times daily.  180 tablet  3  . esomeprazole (NEXIUM) 40 MG capsule Take 40 mg by mouth daily.        . furosemide (LASIX) 20 MG tablet Take 1 tablet (20 mg total) by mouth daily.  90 tablet  4  . glyBURIDE-metformin (GLUCOVANCE) 5-500 MG per tablet Take 1 tablet by mouth daily.        . insulin glargine (LANTUS) 100 UNIT/ML injection Inject into the skin as directed.         Marland Kitchen lisinopril (PRINIVIL,ZESTRIL) 10 MG tablet Take 10 mg by mouth daily.        . meloxicam (MOBIC) 7.5 MG tablet Take 7.5 mg by mouth 3 (three) times daily. As needed      . niacin (NIASPAN) 1000 MG CR tablet Take 1 tablet (1,000 mg total) by mouth at bedtime.  90 tablet  3    No Known Allergies  Review of Systems negative except from HPI and PMH  Physical Exam BP 108/60  Pulse 84  Ht 5\' 10"  (1.778 m)  Wt 211 lb 1.9 oz (95.763 kg)  BMI 30.29 kg/m2 Well developed and well nourished in no acute distress HENT normal E scleral and icterus clear Neck Supple JVP flat; carotids brisk and full Clear to ausculation Regular rate and rhythm, no murmurs gallops or rub Soft with active bowel sounds No clubbing cyanosis Trace Edema Alert and oriented, grossly normal motor and sensory function Skin Warm and Dry    Assessment and  Plan

## 2012-02-10 NOTE — Assessment & Plan Note (Signed)
wiill have him decreaes his "ASA to qod until he sees  Dr Marsa Aris

## 2012-02-10 NOTE — Patient Instructions (Signed)
Your physician has recommended you make the following change in your medication:  1) Decrease aspirin to 325 mg one tablet by mouth every other day.  Your physician wants you to follow-up in: 1 year with Dr. Graciela Husbands. You will receive a reminder letter in the mail two months in advance. If you don't receive a letter, please call our office to schedule the follow-up appointment.

## 2012-02-10 NOTE — Assessment & Plan Note (Signed)
With cardeiomyopahty, would ask Dr [facility] as to whether he is candidate for ARA

## 2012-02-10 NOTE — Telephone Encounter (Signed)
Left message for pt to call, dr Graciela Husbands and dr [name] have talked about the pt and they want him to start spironolactone 25 mg once daily with a bmp in one week.

## 2012-02-10 NOTE — Assessment & Plan Note (Signed)
No intercurrent Ventricular tachycardia  

## 2012-02-10 NOTE — Assessment & Plan Note (Signed)
The patient's device was interrogated.  The information was reviewed. No changes were made in the programming.    

## 2012-02-11 MED ORDER — SPIRONOLACTONE 25 MG PO TABS: 25.0000 mg | ORAL_TABLET | Freq: Every day | ORAL | Status: DC

## 2012-02-11 NOTE — Telephone Encounter (Signed)
Patient called was told Dr.Klein and Dr.Crenshaw have prescribed spironolactone 25 mg daily.Bmet to be done 1 week after taking.

## 2012-02-11 NOTE — Telephone Encounter (Signed)
F/u on previous call:  Patient returning call back to nurse Debra from yesterday.

## 2012-02-21 ENCOUNTER — Telehealth: Payer: Self-pay | Admitting: Internal Medicine

## 2012-02-21 NOTE — Telephone Encounter (Signed)
I spoke with the patient. He has just received his spironolactone from his mail order and he wanted to clarify if he needed labs first. I explained we need to have him on his medication a week, then he needs a repeat bmp. He would like to have this done in [place]. I have mailed a lab order to the patient.

## 2012-02-21 NOTE — Telephone Encounter (Signed)
New problem:  Patient calling need clarification on when start new medication.  Spironolactone

## 2012-02-28 ENCOUNTER — Other Ambulatory Visit: Payer: Medicare PPO

## 2012-02-28 ENCOUNTER — Encounter: Payer: Self-pay | Admitting: Internal Medicine

## 2012-03-25 ENCOUNTER — Other Ambulatory Visit: Payer: Self-pay | Admitting: Cardiology

## 2012-05-18 ENCOUNTER — Ambulatory Visit (INDEPENDENT_AMBULATORY_CARE_PROVIDER_SITE_OTHER): Payer: Medicare PPO | Admitting: *Deleted

## 2012-05-18 DIAGNOSIS — Z95810 Presence of automatic (implantable) cardiac defibrillator: Secondary | ICD-10-CM

## 2012-05-18 DIAGNOSIS — I472 Ventricular tachycardia, unspecified: Secondary | ICD-10-CM

## 2012-05-23 ENCOUNTER — Other Ambulatory Visit: Payer: Self-pay

## 2012-05-23 ENCOUNTER — Encounter: Payer: Self-pay | Admitting: Internal Medicine

## 2012-05-23 ENCOUNTER — Telehealth: Payer: Self-pay | Admitting: Cardiology

## 2012-05-23 NOTE — Telephone Encounter (Signed)
Spoke with pt, he reports yesterday and today he has had a lot of skipping. About 15 times in one minute. He has not been sleeping well but has no c/o cardiac symptoms that keep him awake. He also reports feeling a "straining" type feeling in his head when he stands from sitting. It does not occur everytime. He checks his BP and one day it was 90/50, this morning it was 116/72. The only med changes are his asa is now 81mg  and he reports his medical doctor decreased his furosemide to 20 mg everyother day, he has been doing this for about one week. He sends a transmission every Thursday. He will go ahead and send a transmission now and the device nurses will watch for the results.Will forward for dr [name] review

## 2012-05-23 NOTE — Telephone Encounter (Signed)
Review transmission [name]

## 2012-05-23 NOTE — Telephone Encounter (Signed)
Pt calling re heart skipping beats up to 15 times per minute since yesterday, not sleeping well, some lightheadedness from sitting to standing, pls advise

## 2012-05-23 NOTE — Telephone Encounter (Signed)
Transmission reviewed by dr [name], Spoke with pt, the pt is having PVC's. He will make sure to drink plenty of fluids. Follow up appt made with dr [name] at pts request. He will track his BP and bring that record to his appt.

## 2012-05-25 LAB — REMOTE ICD DEVICE
BATTERY VOLTAGE: 2.58 V
BRDY-0002RV: 40 {beats}/min
CHARGE TIME: 14.1 s
DEV-0020ICD: NEGATIVE
DEVICE MODEL ICD: 128830
PACEART VT: 0
TOT-0006: 20130627000000
TZAT-0001FASTVT: 1
TZAT-0013FASTVT: 1
TZAT-0013SLOWVT: 2
TZAT-0018FASTVT: NEGATIVE
TZAT-0018SLOWVT: NEGATIVE
TZST-0001FASTVT: 3
TZST-0001FASTVT: 7
TZST-0001SLOWVT: 3
TZST-0001SLOWVT: 4
TZST-0001SLOWVT: 7
TZST-0003FASTVT: 31 J
TZST-0003FASTVT: 31 J
TZST-0003SLOWVT: 17 J
TZST-0003SLOWVT: 31 J
TZST-0003SLOWVT: 31 J
TZST-0003SLOWVT: 31 J
VENTRICULAR PACING ICD: 0 pct

## 2012-06-16 ENCOUNTER — Encounter: Payer: Self-pay | Admitting: Cardiology

## 2012-06-16 ENCOUNTER — Ambulatory Visit (INDEPENDENT_AMBULATORY_CARE_PROVIDER_SITE_OTHER): Payer: Medicare PPO | Admitting: Cardiology

## 2012-06-16 VITALS — BP 115/60 | HR 66 | Wt 218.0 lb

## 2012-06-16 DIAGNOSIS — I509 Heart failure, unspecified: Secondary | ICD-10-CM

## 2012-06-16 DIAGNOSIS — I5022 Chronic systolic (congestive) heart failure: Secondary | ICD-10-CM

## 2012-06-16 MED ORDER — FUROSEMIDE 20 MG PO TABS: 20.0000 mg | ORAL_TABLET | Freq: Every day | ORAL | Status: DC

## 2012-06-16 NOTE — Assessment & Plan Note (Signed)
Management per electrophysiology. 

## 2012-06-16 NOTE — Progress Notes (Signed)
HPI: [name] is a pleasant gentleman who has a history of coronary disease, status post coronary artery bypassing graft, ischemic cardiomyopathy, hypertension, diabetes, and hyperlipidemia. His most recent Myoview was performed in Jan 2012. At that time, his ejection fraction was 31%. There was prior apical, inferoseptal and inferior infarction, but there was no ischemia. He also has had a previous defibrillator placed. I last saw him in Jan 2013. Since then, the patient contacted the office on October 8 with palpitations. He was having PVCs by remote transmission. He had some dizziness but apparently was seen for inner ear problems and that has improved. His Lasix was changed to every other day and he has noticed mild increased dyspnea on exertion and occasional orthopnea. He has not had chest pain or syncope.   Current Outpatient Prescriptions  Medication Sig Dispense Refill  . aspirin 81 MG tablet Take 81 mg by mouth daily.      Marland Kitchen atorvastatin (LIPITOR) 10 MG tablet Take 10 mg by mouth daily.        . carvedilol (COREG) 25 MG tablet Take 1 tablet (25 mg total) by mouth 2 (two) times daily.  180 tablet  3  . esomeprazole (NEXIUM) 40 MG capsule Take 40 mg by mouth daily.        . furosemide (LASIX) 20 MG tablet Take 20 mg by mouth every other day.       . glyBURIDE-metformin (GLUCOVANCE) 5-500 MG per tablet Take 1 tablet by mouth daily.        . insulin glargine (LANTUS) 100 UNIT/ML injection Inject into the skin as directed.        Marland Kitchen lisinopril (PRINIVIL,ZESTRIL) 10 MG tablet Take 10 mg by mouth daily.        . meloxicam (MOBIC) 7.5 MG tablet Take 7.5 mg by mouth 3 (three) times daily. As needed      . niacin (NIASPAN) 500 MG CR tablet Take 500 mg by mouth at bedtime.      Marland Kitchen spironolactone (ALDACTONE) 25 MG tablet Take 1 tablet (25 mg total) by mouth daily.  90 tablet  3  . DISCONTD: furosemide (LASIX) 20 MG tablet TAKE 1 TABLET DAILY  90 tablet  0     Past Medical History  Diagnosis  Date  . Other and unspecified hyperlipidemia   . Unspecified essential hypertension   . Coronary atherosclerosis of unspecified type of vessel, native or graft   . Ventricular tachycardia     Rx via ICD 5 /2012  . Congestive heart failure, unspecified   . Rheumatoid arthritis   . Type II or unspecified type diabetes mellitus without mention of complication, not stated as uncontrolled   . Peptic ulcer, unspecified site, unspecified as acute or chronic, without mention of hemorrhage, perforation, or obstruction   . Ischemic cardiomyopathy   . ICD (implantable cardiac defibrillator), single BSX     DOI 2008    Past Surgical History  Procedure Date  . Decomppression (other)     left median nerve  . Coronary artey bypass graft     status post  . Cardiac defibrillator placement     status post implantable    History   Social History  . Marital Status: Married    Spouse Name: N/A    Number of Children: N/A  . Years of Education: N/A   Occupational History  . Not on file.   Social History Main Topics  . Smoking status: Never Smoker   . Smokeless  tobacco: Not on file  . Alcohol Use: No  . Drug Use: No  . Sexually Active:    Other Topics Concern  . Not on file   Social History Narrative   Married. Has 1 surviving child and 4 surviving grandchildren.     ROS: no fevers or chills, productive cough, hemoptysis, dysphasia, odynophagia, melena, hematochezia, dysuria, hematuria, rash, seizure activity, orthopnea, PND, pedal edema, claudication. Remaining systems are negative.  Physical Exam: Well-developed well-nourished in no acute distress.  Skin is warm and dry.  HEENT is normal.  Neck is supple.  Chest is clear to auscultation with normal expansion.  Cardiovascular exam is regular rate and rhythm. 2/6 systolic murmur apex. Abdominal exam nontender or distended. No masses palpated. Extremities show no edema. neuro grossly intact  ECG sinus rhythm at a rate of 66.  Left axis deviation. Prior anterior and inferior infarct. Nonspecific T-wave changes.

## 2012-06-16 NOTE — Assessment & Plan Note (Signed)
Continue aspirin and statin. 

## 2012-06-16 NOTE — Assessment & Plan Note (Signed)
Patient has worsening dyspnea. Plan change Lasix to 20 mg daily. Repeat echocardiogram. He is also having occasional palpitations and PVCs noted. Continue beta blocker. Check magnesium and potassium as well as TSH. Check BNP.

## 2012-06-16 NOTE — Assessment & Plan Note (Signed)
Blood pressure controlled. Continue present medications. 

## 2012-06-16 NOTE — Patient Instructions (Addendum)
Your physician recommends that you schedule a follow-up appointment in: 4-6 WEEKS WITH DR CRENSHAW  INCREASE FUROSEMIDE TO 20 MG DAILY  Your physician has requested that you have an echocardiogram. Echocardiography is a painless test that uses sound waves to create images of your heart. It provides your doctor with information about the size and shape of your heart and how well your heart's chambers and valves are working. This procedure takes approximately one hour. There are no restrictions for this procedure.   Your physician recommends that you return for lab work in: ONE WEEK

## 2012-06-16 NOTE — Assessment & Plan Note (Signed)
Continue statin. Check lipids and liver. 

## 2012-06-16 NOTE — Assessment & Plan Note (Signed)
Continue ACE inhibitor and beta blocker. 

## 2012-06-16 NOTE — Addendum Note (Signed)
Addended by: Freddi Starr on: 06/16/2012 10:24 AM   Modules accepted: Orders

## 2012-06-23 ENCOUNTER — Ambulatory Visit (HOSPITAL_COMMUNITY): Payer: Medicare PPO | Attending: Cardiology | Admitting: Radiology

## 2012-06-23 ENCOUNTER — Other Ambulatory Visit (INDEPENDENT_AMBULATORY_CARE_PROVIDER_SITE_OTHER): Payer: Medicare PPO

## 2012-06-23 DIAGNOSIS — I369 Nonrheumatic tricuspid valve disorder, unspecified: Secondary | ICD-10-CM | POA: Insufficient documentation

## 2012-06-23 DIAGNOSIS — I10 Essential (primary) hypertension: Secondary | ICD-10-CM | POA: Insufficient documentation

## 2012-06-23 DIAGNOSIS — I2510 Atherosclerotic heart disease of native coronary artery without angina pectoris: Secondary | ICD-10-CM | POA: Insufficient documentation

## 2012-06-23 DIAGNOSIS — R0609 Other forms of dyspnea: Secondary | ICD-10-CM

## 2012-06-23 DIAGNOSIS — E119 Type 2 diabetes mellitus without complications: Secondary | ICD-10-CM | POA: Insufficient documentation

## 2012-06-23 DIAGNOSIS — I509 Heart failure, unspecified: Secondary | ICD-10-CM | POA: Insufficient documentation

## 2012-06-23 LAB — BASIC METABOLIC PANEL
BUN: 23 mg/dL (ref 6–23)
CO2: 25 mEq/L (ref 19–32)
GFR: 57.15 mL/min — ABNORMAL LOW (ref >60.00)
Glucose, Bld: 98 mg/dL (ref 70–99)
Potassium: 4.9 mEq/L (ref 3.5–5.1)

## 2012-06-23 LAB — HEPATIC FUNCTION PANEL
ALT: 17 U/L (ref 0–53)
AST: 16 U/L (ref 0–37)
Alkaline Phosphatase: 64 U/L (ref 39–117)
Total Bilirubin: 0.7 mg/dL (ref 0.3–1.2)

## 2012-06-23 LAB — LIPID PANEL
HDL: 32.9 mg/dL — ABNORMAL LOW (ref >39.00)
Triglycerides: 84 mg/dL (ref 0.0–149.0)

## 2012-06-23 LAB — TSH: TSH: 3.43 u[IU]/mL (ref 0.35–5.50)

## 2012-06-23 LAB — BRAIN NATRIURETIC PEPTIDE: Pro B Natriuretic peptide (BNP): 173 pg/mL — ABNORMAL HIGH (ref 0.0–100.0)

## 2012-06-23 NOTE — Progress Notes (Signed)
Echocardiogram performed.  

## 2012-06-26 ENCOUNTER — Telehealth: Payer: Self-pay | Admitting: Cardiology

## 2012-06-26 NOTE — Telephone Encounter (Signed)
New Problem:    Patient called in returning a call about his ECHO and blood work results.  Please call back.

## 2012-06-26 NOTE — Telephone Encounter (Signed)
Spoke with pt, aware of echo and lab results. 

## 2012-07-28 ENCOUNTER — Encounter: Payer: Self-pay | Admitting: Cardiology

## 2012-07-28 ENCOUNTER — Other Ambulatory Visit: Payer: Self-pay | Admitting: *Deleted

## 2012-07-28 ENCOUNTER — Ambulatory Visit (INDEPENDENT_AMBULATORY_CARE_PROVIDER_SITE_OTHER): Payer: Medicare PPO | Admitting: Cardiology

## 2012-07-28 VITALS — BP 115/56 | HR 74 | Ht 70.0 in | Wt 221.0 lb

## 2012-07-28 DIAGNOSIS — I509 Heart failure, unspecified: Secondary | ICD-10-CM

## 2012-07-28 DIAGNOSIS — I2510 Atherosclerotic heart disease of native coronary artery without angina pectoris: Secondary | ICD-10-CM

## 2012-07-28 DIAGNOSIS — I10 Essential (primary) hypertension: Secondary | ICD-10-CM

## 2012-07-28 DIAGNOSIS — E785 Hyperlipidemia, unspecified: Secondary | ICD-10-CM

## 2012-07-28 DIAGNOSIS — I5022 Chronic systolic (congestive) heart failure: Secondary | ICD-10-CM

## 2012-07-28 DIAGNOSIS — Z95810 Presence of automatic (implantable) cardiac defibrillator: Secondary | ICD-10-CM

## 2012-07-28 DIAGNOSIS — I2589 Other forms of chronic ischemic heart disease: Secondary | ICD-10-CM

## 2012-07-28 MED ORDER — FUROSEMIDE 20 MG PO TABS: 20.0000 mg | ORAL_TABLET | Freq: Every day | ORAL | Status: DC

## 2012-07-28 NOTE — Assessment & Plan Note (Signed)
Continue ACE inhibitor and beta blocker. 

## 2012-07-28 NOTE — Assessment & Plan Note (Signed)
Continue aspirin and statin. 

## 2012-07-28 NOTE — Patient Instructions (Addendum)
Your physician wants you to follow-up in:  6 months. You will receive a reminder letter in the mail two months in advance. If you don't receive a letter, please call our office to schedule the follow-up appointment.   

## 2012-07-28 NOTE — Assessment & Plan Note (Signed)
Continue statin. 

## 2012-07-28 NOTE — Assessment & Plan Note (Signed)
Patient's volume status improved. Continue present dose of diuretic.

## 2012-07-28 NOTE — Assessment & Plan Note (Signed)
Blood pressure controlled. Continue present medications. 

## 2012-07-28 NOTE — Assessment & Plan Note (Signed)
Management per electrophysiology. 

## 2012-07-28 NOTE — Progress Notes (Signed)
HPI: [name] is a pleasant gentleman who has a history of coronary disease, status post coronary artery bypassing graft, ischemic cardiomyopathy, hypertension, diabetes, and hyperlipidemia. His most recent Myoview was performed in Jan 2012. At that time, his ejection fraction was 31%. There was prior apical, inferoseptal and inferior infarction, but there was no ischemia. He also has had a previous defibrillator placed. Echocardiogram in November of 2013 showed an ejection fraction of 45-50%. There was grade 1 diastolic dysfunction. There was moderate left atrial and mild right ventricular/right atrial enlargement. The patient contacted the office in Oct 2013 with palpitations. He was having PVCs by remote transmission. When I saw him in November of 2013 he was complaining of increased dyspnea. We increased his Lasix. Her BNP was 173. Since that time, his dyspnea on exertion has improved. There is no orthopnea, PND, pedal edema, syncope or chest pain.   Current Outpatient Prescriptions  Medication Sig Dispense Refill  . aspirin 81 MG tablet Take 81 mg by mouth daily.      Marland Kitchen atorvastatin (LIPITOR) 10 MG tablet Take 10 mg by mouth daily.        . carvedilol (COREG) 25 MG tablet Take 1 tablet (25 mg total) by mouth 2 (two) times daily.  180 tablet  3  . esomeprazole (NEXIUM) 40 MG capsule Take 40 mg by mouth daily.        . furosemide (LASIX) 20 MG tablet Take 1 tablet (20 mg total) by mouth daily.  30 tablet  12  . glyBURIDE-metformin (GLUCOVANCE) 5-500 MG per tablet Take 1 tablet by mouth daily.        . insulin glargine (LANTUS) 100 UNIT/ML injection Inject into the skin as directed.        Marland Kitchen lisinopril (PRINIVIL,ZESTRIL) 10 MG tablet Take 10 mg by mouth daily.        . meloxicam (MOBIC) 7.5 MG tablet Take 7.5 mg by mouth 3 (three) times daily. As needed      . niacin (NIASPAN) 500 MG CR tablet Take 500 mg by mouth at bedtime.      Marland Kitchen spironolactone (ALDACTONE) 25 MG tablet Take 1 tablet (25 mg  total) by mouth daily.  90 tablet  3     Past Medical History  Diagnosis Date  . Other and unspecified hyperlipidemia   . Unspecified essential hypertension   . Coronary atherosclerosis of unspecified type of vessel, native or graft   . Ventricular tachycardia     Rx via ICD 5 /2012  . Congestive heart failure, unspecified   . Rheumatoid arthritis   . Type II or unspecified type diabetes mellitus without mention of complication, not stated as uncontrolled   . Peptic ulcer, unspecified site, unspecified as acute or chronic, without mention of hemorrhage, perforation, or obstruction   . Ischemic cardiomyopathy   . ICD (implantable cardiac defibrillator), single BSX     DOI 2008    Past Surgical History  Procedure Date  . Decomppression (other)     left median nerve  . Coronary artey bypass graft     status post  . Cardiac defibrillator placement     status post implantable    History   Social History  . Marital Status: Married    Spouse Name: N/A    Number of Children: N/A  . Years of Education: N/A   Occupational History  . Not on file.   Social History Main Topics  . Smoking status: Never Smoker   .  Smokeless tobacco: Not on file  . Alcohol Use: No  . Drug Use: No  . Sexually Active:    Other Topics Concern  . Not on file   Social History Narrative   Married. Has 1 surviving child and 4 surviving grandchildren.     ROS: no fevers or chills, productive cough, hemoptysis, dysphasia, odynophagia, melena, hematochezia, dysuria, hematuria, rash, seizure activity, orthopnea, PND, pedal edema, claudication. Remaining systems are negative.  Physical Exam: Well-developed well-nourished in no acute distress.  Skin is warm and dry.  HEENT is normal.  Neck is supple.  Chest is clear to auscultation with normal expansion.  Cardiovascular exam is regular rate and rhythm.  Abdominal exam nontender or distended. No masses palpated. Extremities show trace edema. neuro  grossly intact

## 2012-08-14 ENCOUNTER — Telehealth: Payer: Self-pay | Admitting: Cardiology

## 2012-08-14 MED ORDER — NIACIN ER (ANTIHYPERLIPIDEMIC) 500 MG PO TBCR: EXTENDED_RELEASE_TABLET | ORAL | Status: DC

## 2012-08-14 NOTE — Telephone Encounter (Signed)
Pt needs refill on niaspan 1000 mg qd pls call it into local [facility] on [address] in [place] [phone]

## 2012-08-18 ENCOUNTER — Ambulatory Visit: Payer: Medicare PPO | Admitting: Cardiology

## 2012-08-24 ENCOUNTER — Ambulatory Visit (INDEPENDENT_AMBULATORY_CARE_PROVIDER_SITE_OTHER): Payer: Medicare PPO | Admitting: *Deleted

## 2012-08-24 DIAGNOSIS — Z95810 Presence of automatic (implantable) cardiac defibrillator: Secondary | ICD-10-CM

## 2012-08-24 DIAGNOSIS — I2589 Other forms of chronic ischemic heart disease: Secondary | ICD-10-CM

## 2012-08-28 LAB — REMOTE ICD DEVICE
BRDY-0002RV: 40 {beats}/min
CHARGE TIME: 14.1 s
DEV-0020ICD: NEGATIVE
RV LEAD AMPLITUDE: 10.8 mv
RV LEAD IMPEDENCE ICD: 473 Ohm
TOT-0006: 20140102000000
TZAT-0001FASTVT: 2
TZAT-0001SLOWVT: 2
TZAT-0013FASTVT: 1
TZAT-0013SLOWVT: 2
TZAT-0018FASTVT: NEGATIVE
TZAT-0018SLOWVT: NEGATIVE
TZON-0003SLOWVT: 352.9 ms
TZST-0001FASTVT: 3
TZST-0001FASTVT: 4
TZST-0001FASTVT: 7
TZST-0001SLOWVT: 5
TZST-0001SLOWVT: 6
TZST-0001SLOWVT: 7
TZST-0003FASTVT: 23 J
TZST-0003FASTVT: 31 J
TZST-0003FASTVT: 31 J
TZST-0003SLOWVT: 17 J
TZST-0003SLOWVT: 23 J
TZST-0003SLOWVT: 31 J
VF: 0

## 2012-09-07 ENCOUNTER — Other Ambulatory Visit: Payer: Self-pay | Admitting: *Deleted

## 2012-09-07 MED ORDER — NIACIN ER (ANTIHYPERLIPIDEMIC) 500 MG PO TBCR: EXTENDED_RELEASE_TABLET | ORAL | Status: DC

## 2012-09-12 ENCOUNTER — Encounter: Payer: Self-pay | Admitting: *Deleted

## 2012-09-14 ENCOUNTER — Telehealth: Payer: Self-pay | Admitting: Cardiology

## 2012-09-14 NOTE — Telephone Encounter (Signed)
Pt needs [place] to [facility] at number listed above and give directions on Niacin they have Rx but no directions

## 2012-09-14 NOTE — Telephone Encounter (Signed)
Tried to call patient to tell them I spoke with [facility] and tell the patient what they said. No answer so left message for patient to call back. [facility] needs the patient to call them.

## 2012-09-15 ENCOUNTER — Encounter: Payer: Self-pay | Admitting: Internal Medicine

## 2012-11-23 ENCOUNTER — Other Ambulatory Visit: Payer: Self-pay

## 2012-11-23 ENCOUNTER — Ambulatory Visit (INDEPENDENT_AMBULATORY_CARE_PROVIDER_SITE_OTHER): Payer: Medicare PPO | Admitting: *Deleted

## 2012-11-23 DIAGNOSIS — Z95810 Presence of automatic (implantable) cardiac defibrillator: Secondary | ICD-10-CM

## 2012-11-23 DIAGNOSIS — I2589 Other forms of chronic ischemic heart disease: Secondary | ICD-10-CM

## 2012-11-27 ENCOUNTER — Other Ambulatory Visit: Payer: Self-pay | Admitting: Internal Medicine

## 2012-11-30 LAB — REMOTE ICD DEVICE
BATTERY VOLTAGE: 2.57 V
BRDY-0002RV: 40 {beats}/min
CHARGE TIME: 14.1 s
DEV-0020ICD: NEGATIVE
FVT: 0
HV IMPEDENCE: 37 Ohm
RV LEAD IMPEDENCE ICD: 494 Ohm
TOT-0006: 20140109000000
TZAT-0001FASTVT: 2
TZAT-0001SLOWVT: 1
TZAT-0001SLOWVT: 2
TZAT-0002FASTVT: NEGATIVE
TZAT-0013SLOWVT: 2
TZAT-0018FASTVT: NEGATIVE
TZAT-0018SLOWVT: NEGATIVE
TZON-0003FASTVT: 300 ms
TZON-0003SLOWVT: 352.9 ms
TZST-0001FASTVT: 5
TZST-0001FASTVT: 7
TZST-0001SLOWVT: 5
TZST-0001SLOWVT: 6
TZST-0003FASTVT: 23 J
TZST-0003FASTVT: 31 J
TZST-0003FASTVT: 31 J
TZST-0003SLOWVT: 23 J
TZST-0003SLOWVT: 31 J
VF: 0

## 2012-12-29 ENCOUNTER — Encounter: Payer: Self-pay | Admitting: *Deleted

## 2013-01-17 ENCOUNTER — Encounter: Payer: Self-pay | Admitting: Internal Medicine

## 2013-02-01 ENCOUNTER — Ambulatory Visit: Payer: Medicare PPO | Admitting: Cardiology

## 2013-02-09 ENCOUNTER — Ambulatory Visit (INDEPENDENT_AMBULATORY_CARE_PROVIDER_SITE_OTHER): Payer: Medicare PPO | Admitting: Internal Medicine

## 2013-02-09 ENCOUNTER — Encounter: Payer: Self-pay | Admitting: Internal Medicine

## 2013-02-09 VITALS — BP 116/60 | HR 78 | Ht 70.5 in | Wt 215.4 lb

## 2013-02-09 DIAGNOSIS — E785 Hyperlipidemia, unspecified: Secondary | ICD-10-CM

## 2013-02-09 DIAGNOSIS — I5022 Chronic systolic (congestive) heart failure: Secondary | ICD-10-CM

## 2013-02-09 DIAGNOSIS — Z95810 Presence of automatic (implantable) cardiac defibrillator: Secondary | ICD-10-CM

## 2013-02-09 DIAGNOSIS — I472 Ventricular tachycardia: Secondary | ICD-10-CM

## 2013-02-09 DIAGNOSIS — I2589 Other forms of chronic ischemic heart disease: Secondary | ICD-10-CM

## 2013-02-09 LAB — ICD DEVICE OBSERVATION
BRDY-0002RV: 40 {beats}/min
CHARGE TIME: 14.3 s
DEV-0020ICD: NEGATIVE
DEVICE MODEL ICD: 128830
TZAT-0001FASTVT: 2
TZAT-0001SLOWVT: 2
TZAT-0013FASTVT: 1
TZAT-0018FASTVT: NEGATIVE
TZAT-0018SLOWVT: NEGATIVE
TZST-0001FASTVT: 3
TZST-0001FASTVT: 4
TZST-0001FASTVT: 7
TZST-0003FASTVT: 31 J
TZST-0003FASTVT: 31 J
TZST-0003SLOWVT: 23 J
TZST-0003SLOWVT: 31 J
VENTRICULAR PACING ICD: 0 pct

## 2013-02-09 LAB — BASIC METABOLIC PANEL
CO2: 20 mEq/L (ref 19–32)
GFR: 49.12 mL/min — ABNORMAL LOW (ref >60.00)
Glucose, Bld: 166 mg/dL — ABNORMAL HIGH (ref 70–99)
Potassium: 4.7 mEq/L (ref 3.5–5.1)
Sodium: 130 mEq/L — ABNORMAL LOW (ref 135–145)

## 2013-02-09 MED ORDER — SPIRONOLACTONE 25 MG PO TABS: 12.5000 mg | ORAL_TABLET | Freq: Every day | ORAL | Status: DC

## 2013-02-09 MED ORDER — CARVEDILOL 25 MG PO TABS: 12.5000 mg | ORAL_TABLET | Freq: Two times a day (BID) | ORAL | Status: DC

## 2013-02-09 MED ORDER — LISINOPRIL 10 MG PO TABS: 5.0000 mg | ORAL_TABLET | Freq: Every day | ORAL | Status: DC

## 2013-02-09 NOTE — Assessment & Plan Note (Signed)
Lasted review with Dr. Marsa Aris the role of ongoing niacin based on the AIM-HIGH  trial

## 2013-02-09 NOTE — Patient Instructions (Addendum)
Medication changes:  DECREASE:  Coreg to 12.5mg  twice a day                                                                                                                             Lisinopril to 5 mg daily                                                                                                                              Aldactone 12.5 mg daily   You will need lab work today:  BMP    We will call you with your results  Keep your follow-up appointment with Dr. [name] on 03/16/13  Send in a [facility] transmission on 05/10/13  Your physician wants you to follow-up in: 1 year with Dr. Graciela Husbands. You will receive a reminder letter in the mail two months in advance. If you don't receive a letter, please call our office to schedule the follow-up appointment.

## 2013-02-09 NOTE — Assessment & Plan Note (Signed)
The patient's device was interrogated.  The information was reviewed. No changes were made in the programming.   i suspect he will reach ERi within 12 months

## 2013-02-09 NOTE — Assessment & Plan Note (Signed)
Euvolemic continue current meds   

## 2013-02-09 NOTE — Assessment & Plan Note (Signed)
.  npovt No intercurrent Ventricular tachycardia'

## 2013-02-09 NOTE — Assessment & Plan Note (Addendum)
We'll continue him on triple therapy. We will resume his lisinopril at 5; because of the low blood pressure issue I will decrease his carvedilol 25--12.5 and his Aldactone similarly. He is to see Dr. Marsa Aris in about 5 weeks if his blood pressure allows  We'll also check a metabolic profile today for surveillance while on Aldactone potassium and renal function

## 2013-02-09 NOTE — Progress Notes (Signed)
Patient Care Team: [name] as PCP - General (Unknown Physician Specialty) [name] (Unknown Physician Specialty)   HPI  [name] is a 78 y.o. male Seen in followup for an ICD implanted implanted for primary prevention he has a history of ischemic heart disease and underwent Myoview scanning in February 2102 demonstrated no ischemia and an ejection fraction of 26%. There is a large scar.   In May 2012 while working on his house in [place] he was shocked. Interrogation of his device demonstrates ventricular tachycardia.    He has had some lightheadedness; his PCP noted his blood pressure was in the 90s. He asked him to hold his lisinopril. This is improved.  He denies chest pain or shortness of breath apart from his baseline. He is mostly limited by the arthritis in his knees  He has had no intercurrent discharges   Past Medical History  Diagnosis Date  . Other and unspecified hyperlipidemia   . Unspecified essential hypertension   . Coronary atherosclerosis of unspecified type of vessel, native or graft   . Ventricular tachycardia     Rx via ICD 5 /2012  . Congestive heart failure, unspecified   . Rheumatoid arthritis(714.0)   . Type II or unspecified type diabetes mellitus without mention of complication, not stated as uncontrolled   . Peptic ulcer, unspecified site, unspecified as acute or chronic, without mention of hemorrhage, perforation, or obstruction   . Ischemic cardiomyopathy   . ICD (implantable cardiac defibrillator), single BSX     DOI 2008    Past Surgical History  Procedure Laterality Date  . Decomppression (other)      left median nerve  . Coronary artey bypass graft      status post  . Cardiac defibrillator placement      status post implantable    Current Outpatient Prescriptions  Medication Sig Dispense Refill  . aspirin 81 MG tablet Take 81 mg by mouth daily.      Marland Kitchen atorvastatin (LIPITOR) 10 MG tablet Take 10 mg by mouth daily.         . carvedilol (COREG) 25 MG tablet Take 1 tablet (25 mg total) by mouth 2 (two) times daily.  180 tablet  3  . esomeprazole (NEXIUM) 40 MG capsule Take 40 mg by mouth daily.        . furosemide (LASIX) 20 MG tablet Take 1 tablet (20 mg total) by mouth daily.  90 tablet  3  . glyBURIDE-metformin (GLUCOVANCE) 5-500 MG per tablet Take 1 tablet by mouth daily.        . insulin glargine (LANTUS) 100 UNIT/ML injection Inject into the skin as directed.        Marland Kitchen lisinopril (PRINIVIL,ZESTRIL) 10 MG tablet Take 10 mg by mouth daily.        . meloxicam (MOBIC) 7.5 MG tablet Take 7.5 mg by mouth 3 (three) times daily. As needed      . niacin (NIASPAN) 500 MG CR tablet Take 500 mg by mouth at bedtime.  90 tablet  3  . spironolactone (ALDACTONE) 25 MG tablet TAKE ONE TABLET DAILY  90 tablet  2   No current facility-administered medications for this visit.    No Known Allergies  Review of Systems negative except from HPI and PMH  Physical Exam BP 116/60  Pulse 78  Ht 5' 10.5" (1.791 m)  Wt 215 lb 6.4 oz (97.705 kg)  BMI 30.46 kg/m2 Well developed and well nourished in no  acute distress HENT normal E scleral and icterus clear Neck Supple JVP flat; carotids brisk and full Clear to ausculation  Regular rate and rhythm, no murmurs gallops or rub Soft with active bowel sounds No clubbing cyanosis none Edema Alert and oriented, grossly normal motor and sensory function Skin Warm and Dry    Assessment and  Plan

## 2013-03-16 ENCOUNTER — Ambulatory Visit (INDEPENDENT_AMBULATORY_CARE_PROVIDER_SITE_OTHER): Payer: Medicare PPO | Admitting: Cardiology

## 2013-03-16 ENCOUNTER — Encounter: Payer: Self-pay | Admitting: Cardiology

## 2013-03-16 VITALS — BP 102/68 | HR 71 | Ht 70.5 in | Wt 215.8 lb

## 2013-03-16 DIAGNOSIS — E785 Hyperlipidemia, unspecified: Secondary | ICD-10-CM

## 2013-03-16 DIAGNOSIS — I10 Essential (primary) hypertension: Secondary | ICD-10-CM

## 2013-03-16 DIAGNOSIS — Z95810 Presence of automatic (implantable) cardiac defibrillator: Secondary | ICD-10-CM

## 2013-03-16 DIAGNOSIS — I5022 Chronic systolic (congestive) heart failure: Secondary | ICD-10-CM

## 2013-03-16 DIAGNOSIS — I2510 Atherosclerotic heart disease of native coronary artery without angina pectoris: Secondary | ICD-10-CM

## 2013-03-16 LAB — BASIC METABOLIC PANEL
BUN: 16 mg/dL (ref 6–23)
CO2: 26 mEq/L (ref 19–32)
Chloride: 101 mEq/L (ref 96–112)
Creatinine, Ser: 1.3 mg/dL (ref 0.4–1.5)

## 2013-03-16 NOTE — Assessment & Plan Note (Signed)
Continue present medications. 

## 2013-03-16 NOTE — Assessment & Plan Note (Signed)
Continue ACE inhibitor and beta blocker. 

## 2013-03-16 NOTE — Assessment & Plan Note (Signed)
Followed by electrophysiology. 

## 2013-03-16 NOTE — Assessment & Plan Note (Signed)
Continue aspirin and statin. 

## 2013-03-16 NOTE — Assessment & Plan Note (Signed)
Continue statin. I will not advance dose as he has had some myalgias. Discontinue Niaspan.

## 2013-03-16 NOTE — Assessment & Plan Note (Signed)
Continue present dose of diuretics. Check potassium and renal function. 

## 2013-03-16 NOTE — Progress Notes (Signed)
HPI: [name] is a pleasant gentleman who has a history of coronary disease, status post coronary artery bypassing graft, ischemic cardiomyopathy, hypertension, diabetes, and hyperlipidemia. His most recent Myoview was performed in Jan 2012. At that time, his ejection fraction was 31%. There was prior apical, inferoseptal and inferior infarction, but there was no ischemia. He also has had a previous defibrillator placed. Echocardiogram in November of 2013 showed an ejection fraction of 45-50%. There was grade 1 diastolic dysfunction. There was moderate left atrial and mild right ventricular/right atrial enlargement. Patient last seen Dec 2013. Since then, the patient has dyspnea with more extreme activities but not with routine activities. It is relieved with rest. It is not associated with chest pain. There is no orthopnea, PND or pedal edema. There is no syncope or palpitations. There is no exertional chest pain.    Current Outpatient Prescriptions  Medication Sig Dispense Refill  . aspirin 81 MG tablet Take 81 mg by mouth daily.      Marland Kitchen atorvastatin (LIPITOR) 10 MG tablet Take 10 mg by mouth daily.        . carvedilol (COREG) 25 MG tablet Take 0.5 tablets (12.5 mg total) by mouth 2 (two) times daily.  180 tablet  3  . esomeprazole (NEXIUM) 40 MG capsule Take 40 mg by mouth daily.        . furosemide (LASIX) 20 MG tablet Take 1 tablet (20 mg total) by mouth daily.  90 tablet  3  . glyBURIDE-metformin (GLUCOVANCE) 5-500 MG per tablet Take 1 tablet by mouth daily.        . insulin glargine (LANTUS) 100 UNIT/ML injection Inject into the skin as directed.        Marland Kitchen lisinopril (PRINIVIL,ZESTRIL) 10 MG tablet Take 0.5 tablets (5 mg total) by mouth daily.      . meloxicam (MOBIC) 7.5 MG tablet Take 7.5 mg by mouth 3 (three) times daily. As needed      . niacin (NIASPAN) 500 MG CR tablet Take 500 mg by mouth at bedtime.  90 tablet  3  . spironolactone (ALDACTONE) 25 MG tablet Take 0.5 tablets (12.5 mg  total) by mouth daily.  90 tablet  2   No current facility-administered medications for this visit.     Past Medical History  Diagnosis Date  . Other and unspecified hyperlipidemia   . Unspecified essential hypertension   . Coronary atherosclerosis of unspecified type of vessel, native or graft   . Ventricular tachycardia     Rx via ICD 5 /2012  . Congestive heart failure, unspecified   . Rheumatoid arthritis(714.0)   . Type II or unspecified type diabetes mellitus without mention of complication, not stated as uncontrolled   . Peptic ulcer, unspecified site, unspecified as acute or chronic, without mention of hemorrhage, perforation, or obstruction   . Ischemic cardiomyopathy   . ICD (implantable cardiac defibrillator), single BSX     DOI 2008    Past Surgical History  Procedure Laterality Date  . Decomppression (other)      left median nerve  . Coronary artey bypass graft      status post  . Cardiac defibrillator placement      status post implantable    History   Social History  . Marital Status: Married    Spouse Name: N/A    Number of Children: N/A  . Years of Education: N/A   Occupational History  . Not on file.   Social History Main Topics  .  Smoking status: Never Smoker   . Smokeless tobacco: Not on file  . Alcohol Use: No  . Drug Use: No  . Sexually Active:    Other Topics Concern  . Not on file   Social History Narrative   Married. Has 1 surviving child and 4 surviving grandchildren.     ROS: no fevers or chills, productive cough, hemoptysis, dysphasia, odynophagia, melena, hematochezia, dysuria, hematuria, rash, seizure activity, orthopnea, PND, pedal edema, claudication. Remaining systems are negative.  Physical Exam: Well-developed well-nourished in no acute distress.  Skin is warm and dry.  HEENT is normal.  Neck is supple.  Chest is clear to auscultation with normal expansion.  Cardiovascular exam is regular rate and rhythm.  Abdominal  exam nontender or distended. No masses palpated. Extremities show trace edema. neuro grossly intact  ECG sinus rhythm at a rate of 71. Fusion complex. Prolonged QT interval.

## 2013-03-16 NOTE — Patient Instructions (Addendum)
Your physician wants you to follow-up in: 6 MONTHS WITH DR [name] You will receive a reminder letter in the mail two months in advance. If you don't receive a letter, please call our office to schedule the follow-up appointment.   STOP NIASPAN  Your physician recommends that you HAVE LAB WORK TODAY

## 2013-04-09 ENCOUNTER — Other Ambulatory Visit: Payer: Self-pay

## 2013-04-09 MED ORDER — CARVEDILOL 25 MG PO TABS: 12.5000 mg | ORAL_TABLET | Freq: Two times a day (BID) | ORAL | Status: DC

## 2013-04-09 MED ORDER — SPIRONOLACTONE 25 MG PO TABS: 12.5000 mg | ORAL_TABLET | Freq: Every day | ORAL | Status: DC

## 2013-04-09 MED ORDER — LISINOPRIL 10 MG PO TABS: 5.0000 mg | ORAL_TABLET | Freq: Every day | ORAL | Status: DC

## 2013-05-10 ENCOUNTER — Ambulatory Visit: Payer: Medicare PPO | Admitting: *Deleted

## 2013-05-18 ENCOUNTER — Telehealth: Payer: Self-pay | Admitting: *Deleted

## 2013-05-18 NOTE — Telephone Encounter (Signed)
LMOVM not to drive for six months due to ATP on 02/21/13. Requested them to call me back in regards to driving restriction.  This is the second msg I left on voicemail (first was yesterday).

## 2013-05-23 ENCOUNTER — Telehealth: Payer: Self-pay | Admitting: *Deleted

## 2013-05-23 LAB — REMOTE ICD DEVICE
BATTERY VOLTAGE: 2.57 V
BRDY-0002RV: 40 {beats}/min
CHARGE TIME: 14.6 s
RV LEAD IMPEDENCE ICD: 478 Ohm
TZAT-0001FASTVT: 2
TZAT-0001SLOWVT: 2
TZAT-0002FASTVT: NEGATIVE
TZAT-0013SLOWVT: 2
TZAT-0018FASTVT: NEGATIVE
TZON-0003SLOWVT: 352.9 ms
TZST-0001SLOWVT: 6
TZST-0001SLOWVT: 7
TZST-0003FASTVT: 31 J
TZST-0003FASTVT: 31 J
TZST-0003SLOWVT: 23 J
TZST-0003SLOWVT: 31 J
VENTRICULAR PACING ICD: 0 pct
VF: 0

## 2013-05-23 NOTE — Telephone Encounter (Signed)
Informed pt no driving x6 months due to episode on 02/21/13 that was successfully treated with ATP.  Next [facility] 08/23/13.

## 2013-05-24 ENCOUNTER — Telehealth: Payer: Self-pay | Admitting: Cardiology

## 2013-05-24 NOTE — Telephone Encounter (Signed)
New Problem  Pt states he received a call about defib, states that there was an episode around July 9th that his heart rate was high and that he had been restricted from driving.. Requesting a call back to discuss and verify that this really happened.

## 2013-05-24 NOTE — Telephone Encounter (Signed)
Spoke w/pt in regards to episode in July. Pt was confused about phone conversation in regards to episode and no driving. Answered all questions and explained no driving. Pt aware of recommendation of no driving x 6 mths.

## 2013-06-21 ENCOUNTER — Encounter: Payer: Self-pay | Admitting: Internal Medicine

## 2013-07-11 ENCOUNTER — Encounter: Payer: Self-pay | Admitting: Internal Medicine

## 2013-08-23 ENCOUNTER — Ambulatory Visit (INDEPENDENT_AMBULATORY_CARE_PROVIDER_SITE_OTHER): Payer: Medicare Other | Admitting: *Deleted

## 2013-08-23 DIAGNOSIS — I2589 Other forms of chronic ischemic heart disease: Secondary | ICD-10-CM

## 2013-08-23 DIAGNOSIS — I472 Ventricular tachycardia, unspecified: Secondary | ICD-10-CM

## 2013-08-23 DIAGNOSIS — I4729 Other ventricular tachycardia: Secondary | ICD-10-CM

## 2013-08-23 DIAGNOSIS — I5022 Chronic systolic (congestive) heart failure: Secondary | ICD-10-CM

## 2013-08-24 ENCOUNTER — Encounter (INDEPENDENT_AMBULATORY_CARE_PROVIDER_SITE_OTHER): Payer: Self-pay

## 2013-08-24 ENCOUNTER — Ambulatory Visit (INDEPENDENT_AMBULATORY_CARE_PROVIDER_SITE_OTHER): Payer: Medicare Other | Admitting: Cardiology

## 2013-08-24 ENCOUNTER — Encounter: Payer: Self-pay | Admitting: Cardiology

## 2013-08-24 VITALS — BP 155/85 | HR 80 | Ht 70.5 in | Wt 214.2 lb

## 2013-08-24 DIAGNOSIS — Z95810 Presence of automatic (implantable) cardiac defibrillator: Secondary | ICD-10-CM

## 2013-08-24 DIAGNOSIS — I10 Essential (primary) hypertension: Secondary | ICD-10-CM

## 2013-08-24 DIAGNOSIS — I5022 Chronic systolic (congestive) heart failure: Secondary | ICD-10-CM

## 2013-08-24 DIAGNOSIS — E785 Hyperlipidemia, unspecified: Secondary | ICD-10-CM

## 2013-08-24 DIAGNOSIS — I2510 Atherosclerotic heart disease of native coronary artery without angina pectoris: Secondary | ICD-10-CM

## 2013-08-24 DIAGNOSIS — I2589 Other forms of chronic ischemic heart disease: Secondary | ICD-10-CM

## 2013-08-24 LAB — MDC_IDC_ENUM_SESS_TYPE_REMOTE
Date Time Interrogation Session: 20150108145131
HighPow Impedance: 38 Ohm
Implantable Pulse Generator Serial Number: 128830
Lead Channel Impedance Value: 478 Ohm
Lead Channel Sensing Intrinsic Amplitude: 9.1 mV
MDC IDC MSMT BATTERY VOLTAGE: 2.56 V
MDC IDC SET LEADCHNL RV PACING AMPLITUDE: 2.4 V
MDC IDC SET LEADCHNL RV PACING PULSEWIDTH: 0.4 ms
MDC IDC SET ZONE DETECTION INTERVAL: 272.7 ms
MDC IDC STAT BRADY RV PERCENT PACED: 0 %
Zone Setting Detection Interval: 300 ms
Zone Setting Detection Interval: 352.9 ms

## 2013-08-24 NOTE — Progress Notes (Signed)
HPI: FU coronary disease, status post coronary artery bypassing graft, ischemic cardiomyopathy, hypertension, diabetes, and hyperlipidemia. His most recent Myoview was performed in Jan 2012. At that time, his ejection fraction was 31%. There was prior apical, inferoseptal and inferior infarction, but there was no ischemia. He also has had a previous defibrillator placed. Echocardiogram in November of 2013 showed an ejection fraction of 45-50%. There was grade 1 diastolic dysfunction. There was moderate left atrial and mild right ventricular/right atrial enlargement. Patient last seen August 2013. Since then, the patient denies any dyspnea on exertion, orthopnea, PND, pedal edema, palpitations, syncope or chest pain.    Current Outpatient Prescriptions  Medication Sig Dispense Refill  . aspirin 81 MG tablet Take 81 mg by mouth daily.      Marland Kitchen atorvastatin (LIPITOR) 10 MG tablet Take 10 mg by mouth daily.        . carvedilol (COREG) 25 MG tablet Take 0.5 tablets (12.5 mg total) by mouth 2 (two) times daily.  90 tablet  3  . esomeprazole (NEXIUM) 40 MG capsule Take 40 mg by mouth daily.        . furosemide (LASIX) 20 MG tablet Take 1 tablet (20 mg total) by mouth daily.  90 tablet  3  . glyBURIDE-metformin (GLUCOVANCE) 5-500 MG per tablet Take 1 tablet by mouth daily.        . insulin glargine (LANTUS) 100 UNIT/ML injection Inject into the skin as directed.        Marland Kitchen lisinopril (PRINIVIL,ZESTRIL) 10 MG tablet Take 0.5 tablets (5 mg total) by mouth daily.  90 tablet  3  . meloxicam (MOBIC) 7.5 MG tablet Take 7.5 mg by mouth 3 (three) times daily. As needed      . spironolactone (ALDACTONE) 25 MG tablet Take 0.5 tablets (12.5 mg total) by mouth daily.  90 tablet  2   No current facility-administered medications for this visit.     Past Medical History  Diagnosis Date  . Other and unspecified hyperlipidemia   . Unspecified essential hypertension   . Coronary atherosclerosis of unspecified  type of vessel, native or graft   . Ventricular tachycardia     Rx via ICD 5 /2012  . Congestive heart failure, unspecified   . Rheumatoid arthritis(714.0)   . Type II or unspecified type diabetes mellitus without mention of complication, not stated as uncontrolled   . Peptic ulcer, unspecified site, unspecified as acute or chronic, without mention of hemorrhage, perforation, or obstruction   . Ischemic cardiomyopathy   . ICD (implantable cardiac defibrillator), single BSX     DOI 2008    Past Surgical History  Procedure Laterality Date  . Decomppression (other)      left median nerve  . Coronary artey bypass graft      status post  . Cardiac defibrillator placement      status post implantable    History   Social History  . Marital Status: Married    Spouse Name: N/A    Number of Children: N/A  . Years of Education: N/A   Occupational History  . Not on file.   Social History Main Topics  . Smoking status: Never Smoker   . Smokeless tobacco: Not on file  . Alcohol Use: No  . Drug Use: No  . Sexual Activity:    Other Topics Concern  . Not on file   Social History Narrative   Married. Has 1 surviving child and 4 surviving grandchildren.  ROS: no fevers or chills, productive cough, hemoptysis, dysphasia, odynophagia, melena, hematochezia, dysuria, hematuria, rash, seizure activity, orthopnea, PND, pedal edema, claudication. Remaining systems are negative.  Physical Exam: Well-developed well-nourished in no acute distress.  Skin is warm and dry.  HEENT is normal.  Neck is supple.  Chest is clear to auscultation with normal expansion.  Cardiovascular exam is regular rate and rhythm.  Abdominal exam nontender or distended. No masses palpated. Extremities show no edema. neuro grossly intact  ECG sinus rhythm at a rate of 82. Left axis deviation. IVCD. Nonspecific ST changes.

## 2013-08-24 NOTE — Assessment & Plan Note (Signed)
Continued ACE inhibitor, beta blocker and spironolactone. His medications were decreased previously because of hypotension. I have asked him to track his blood pressure at home and we will increase medications as tolerated.

## 2013-08-24 NOTE — Assessment & Plan Note (Signed)
Continue aspirin and statin. 

## 2013-08-24 NOTE — Patient Instructions (Signed)
Your physician wants you to follow-up in: 6 MONTHS WITH DR CRENSHAW You will receive a reminder letter in the mail two months in advance. If you don't receive a letter, please call our office to schedule the follow-up appointment.   Your physician recommends that you HAVE LAB WORK TODAY 

## 2013-08-24 NOTE — Assessment & Plan Note (Signed)
euvolemic on examination. Continue present dose of diuretics. 

## 2013-08-24 NOTE — Assessment & Plan Note (Signed)
Followed by electrophysiology. 

## 2013-08-24 NOTE — Assessment & Plan Note (Signed)
Continue statin. Lipids and liver monitored by primary care. 

## 2013-08-24 NOTE — Assessment & Plan Note (Signed)
Continue present medications. Patient states systolic blood pressure is typically 120. We will follow and increase medications as needed. Check potassium and renal function.

## 2013-08-30 ENCOUNTER — Encounter: Payer: Self-pay | Admitting: *Deleted

## 2013-09-07 ENCOUNTER — Encounter: Payer: Self-pay | Admitting: Internal Medicine

## 2013-09-15 ENCOUNTER — Other Ambulatory Visit: Payer: Self-pay | Admitting: Cardiology

## 2013-11-29 ENCOUNTER — Ambulatory Visit (INDEPENDENT_AMBULATORY_CARE_PROVIDER_SITE_OTHER): Payer: Medicare Other | Admitting: *Deleted

## 2013-11-29 DIAGNOSIS — I4729 Other ventricular tachycardia: Secondary | ICD-10-CM

## 2013-11-29 DIAGNOSIS — I5022 Chronic systolic (congestive) heart failure: Secondary | ICD-10-CM

## 2013-11-29 DIAGNOSIS — I472 Ventricular tachycardia: Secondary | ICD-10-CM

## 2013-11-29 LAB — MDC_IDC_ENUM_SESS_TYPE_REMOTE
Battery Voltage: 2.56 V
Brady Statistic RV Percent Paced: 0 %
Date Time Interrogation Session: 20150423131805
HighPow Impedance: 37 Ohm
Implantable Pulse Generator Serial Number: 128830
Lead Channel Impedance Value: 484 Ohm
Lead Channel Setting Pacing Amplitude: 2.4 V
MDC IDC MSMT LEADCHNL RV SENSING INTR AMPL: 11 mV
MDC IDC SET LEADCHNL RV PACING PULSEWIDTH: 0.4 ms
MDC IDC SET ZONE DETECTION INTERVAL: 300 ms
MDC IDC SET ZONE DETECTION INTERVAL: 352.9 ms
Zone Setting Detection Interval: 272.7 ms

## 2014-01-11 ENCOUNTER — Encounter: Payer: Self-pay | Admitting: Cardiology

## 2014-01-15 ENCOUNTER — Encounter: Payer: Self-pay | Admitting: Internal Medicine

## 2014-01-15 ENCOUNTER — Ambulatory Visit (INDEPENDENT_AMBULATORY_CARE_PROVIDER_SITE_OTHER): Payer: Medicare Other | Admitting: Internal Medicine

## 2014-01-15 ENCOUNTER — Encounter: Payer: Self-pay | Admitting: *Deleted

## 2014-01-15 VITALS — BP 113/62 | HR 80 | Ht 70.0 in | Wt 210.0 lb

## 2014-01-15 DIAGNOSIS — Z95810 Presence of automatic (implantable) cardiac defibrillator: Secondary | ICD-10-CM

## 2014-01-15 DIAGNOSIS — I2589 Other forms of chronic ischemic heart disease: Secondary | ICD-10-CM

## 2014-01-15 DIAGNOSIS — I5022 Chronic systolic (congestive) heart failure: Secondary | ICD-10-CM

## 2014-01-15 DIAGNOSIS — I4729 Other ventricular tachycardia: Secondary | ICD-10-CM

## 2014-01-15 DIAGNOSIS — I255 Ischemic cardiomyopathy: Secondary | ICD-10-CM

## 2014-01-15 DIAGNOSIS — I472 Ventricular tachycardia, unspecified: Secondary | ICD-10-CM

## 2014-01-15 DIAGNOSIS — Z01812 Encounter for preprocedural laboratory examination: Secondary | ICD-10-CM

## 2014-01-15 LAB — BASIC METABOLIC PANEL
BUN: 22 mg/dL (ref 6–23)
CHLORIDE: 99 meq/L (ref 96–112)
CO2: 27 mEq/L (ref 19–32)
Calcium: 9.1 mg/dL (ref 8.4–10.5)
Creatinine, Ser: 1.4 mg/dL (ref 0.4–1.5)
GFR: 50.98 mL/min — ABNORMAL LOW (ref >60.00)
Glucose, Bld: 111 mg/dL — ABNORMAL HIGH (ref 70–99)
Potassium: 4.5 mEq/L (ref 3.5–5.1)
SODIUM: 133 meq/L — AB (ref 135–145)

## 2014-01-15 LAB — CBC WITH DIFFERENTIAL/PLATELET
Basophils Absolute: 0 10*3/uL (ref 0.0–0.1)
Basophils Relative: 0.4 % (ref 0.0–3.0)
EOS PCT: 2.5 % (ref 0.0–5.0)
Eosinophils Absolute: 0.2 10*3/uL (ref 0.0–0.7)
HCT: 32.3 % — ABNORMAL LOW (ref 39.0–52.0)
HEMOGLOBIN: 11.1 g/dL — AB (ref 13.0–17.0)
LYMPHS PCT: 25.1 % (ref 12.0–46.0)
Lymphs Abs: 1.6 10*3/uL (ref 0.7–4.0)
MCHC: 34.3 g/dL (ref 30.0–36.0)
MCV: 81.8 fl (ref 78.0–100.0)
MONO ABS: 0.4 10*3/uL (ref 0.1–1.0)
Monocytes Relative: 7 % (ref 3.0–12.0)
NEUTROS PCT: 65 % (ref 43.0–77.0)
Neutro Abs: 4.2 10*3/uL (ref 1.4–7.7)
Platelets: 209 10*3/uL (ref 150.0–400.0)
RBC: 3.95 Mil/uL — AB (ref 4.22–5.81)
RDW: 13.4 % (ref 11.5–15.5)
WBC: 6.4 10*3/uL (ref 4.0–10.5)

## 2014-01-15 NOTE — Patient Instructions (Signed)
Pre procedure labs today: BMET, CBCD  Your physician has recommended that you have a defibrillator generator change. You are scheduled for 01/25/14  Your wound check is scheduled for  02/04/14 at 2:00 pm

## 2014-01-15 NOTE — Progress Notes (Signed)
Patient Care Team: [name], MD as PCP - General (Unknown Physician Specialty) [name], MD (Unknown Physician Specialty)   HPI  [name] is a 79 y.o. male Seen in followup for an ICD implanted implanted for primary prevention;  he has a history of ischemic heart disease and underwent Myoview scanning in February 210 demonstrated no ischemia and an ejection fraction of 26%. There is a large scar.   In May 2012 while working on his house in [place] he was shocked. Interrogation of his device demonstrated ventricular tachycardia.   He is that orthostatic intolerance improved by discontinuation of the lisinopril  The patient denies chest pain, shortness of breath, nocturnal dyspnea, orthopnea or peripheral edema.  There have been no palpitations, lightheadedness or syncope.       Past Medical History  Diagnosis Date  . Other and unspecified hyperlipidemia   . Unspecified essential hypertension   . Coronary atherosclerosis of unspecified type of vessel, native or graft   . Ventricular tachycardia     Rx via ICD 5 /2012  . Congestive heart failure, unspecified   . Rheumatoid arthritis(714.0)   . Type II or unspecified type diabetes mellitus without mention of complication, not stated as uncontrolled   . Peptic ulcer, unspecified site, unspecified as acute or chronic, without mention of hemorrhage, perforation, or obstruction   . Ischemic cardiomyopathy   . ICD (implantable cardiac defibrillator), single BSX     DOI 2008    Past Surgical History  Procedure Laterality Date  . Decomppression (other)      left median nerve  . Coronary artey bypass graft      status post  . Cardiac defibrillator placement      status post implantable    Current Outpatient Prescriptions  Medication Sig Dispense Refill  . aspirin 81 MG tablet Take 81 mg by mouth daily.      Marland Kitchen atorvastatin (LIPITOR) 10 MG tablet Take 10 mg by mouth daily.        . carvedilol (COREG) 25  MG tablet Take 0.5 tablets (12.5 mg total) by mouth 2 (two) times daily.  90 tablet  3  . esomeprazole (NEXIUM) 40 MG capsule Take 40 mg by mouth daily.        . furosemide (LASIX) 20 MG tablet TAKE 1 TABLET DAILY  90 tablet  2  . glyBURIDE-metformin (GLUCOVANCE) 5-500 MG per tablet Take 1 tablet by mouth daily.        . insulin glargine (LANTUS) 100 UNIT/ML injection Inject into the skin as directed.        Marland Kitchen lisinopril (PRINIVIL,ZESTRIL) 10 MG tablet Take 0.5 tablets (5 mg total) by mouth daily.  90 tablet  3  . meloxicam (MOBIC) 7.5 MG tablet Take 7.5 mg by mouth 3 (three) times daily. As needed      . spironolactone (ALDACTONE) 25 MG tablet Take 0.5 tablets (12.5 mg total) by mouth daily.  90 tablet  2   No current facility-administered medications for this visit.    No Known Allergies  Review of Systems negative except from HPI and PMH  Physical Exam BP 113/62  Pulse 80  Ht 5\' 10"  (1.778 m)  Wt 210 lb (95.255 kg)  BMI 30.13 kg/m2 Well developed and well nourished in no acute distress HENT normal E scleral and icterus clear Neck Supple JVP flat; carotids brisk and full Clear to ausculation  Regular rate and rhythm, no murmurs gallops or rub  Soft with active bowel sounds No clubbing cyanosis no Edema Alert and oriented, grossly normal motor and sensory function Skin Warm and Dry  ECG demonstrates sinus rhythm with PACs heart rate pain Intervals for/11/41 axis left -68  Poor R-wave progression consistent with prior inferior wall MI  prior inferior wall MI  Assessment and  Plan  Ischemic cardiomyopathy S./P. CABG  Implantable defibrillator-[employer]  Ventricular tachycardia  No intercurrent Ventricular tachycardia  Device has reached ERI. We have reviewed the benefits and risks of generator replacement.  These include but are not limited to lead fracture and infection.  The patient understands, agrees and is willing to proceed.

## 2014-01-22 ENCOUNTER — Encounter: Payer: Self-pay | Admitting: Internal Medicine

## 2014-01-22 ENCOUNTER — Encounter (HOSPITAL_COMMUNITY): Payer: Self-pay

## 2014-01-23 ENCOUNTER — Encounter: Payer: Self-pay | Admitting: Internal Medicine

## 2014-01-24 MED ORDER — CEFAZOLIN SODIUM-DEXTROSE 2-3 GM-% IV SOLR
2.0000 g | INTRAVENOUS | Status: AC
  Filled 2014-01-24: qty 50

## 2014-01-24 MED ORDER — SODIUM CHLORIDE 0.9 % IR SOLN
80.0000 mg | Status: AC
  Filled 2014-01-24: qty 2

## 2014-01-25 ENCOUNTER — Ambulatory Visit (HOSPITAL_COMMUNITY)
Admission: RE | Admit: 2014-01-25 | Discharge: 2014-01-25 | Disposition: A | Discharge status: Home / Self Care | Payer: Medicare Other | Source: Ambulatory Visit | Attending: Internal Medicine | Admitting: Internal Medicine

## 2014-01-25 ENCOUNTER — Encounter (HOSPITAL_COMMUNITY)
Admission: RE | Disposition: A | Discharge status: Home / Self Care | Payer: Self-pay | Source: Ambulatory Visit | Attending: Internal Medicine

## 2014-01-25 DIAGNOSIS — E119 Type 2 diabetes mellitus without complications: Secondary | ICD-10-CM | POA: Insufficient documentation

## 2014-01-25 DIAGNOSIS — I472 Ventricular tachycardia, unspecified: Secondary | ICD-10-CM | POA: Insufficient documentation

## 2014-01-25 DIAGNOSIS — I2589 Other forms of chronic ischemic heart disease: Secondary | ICD-10-CM | POA: Insufficient documentation

## 2014-01-25 DIAGNOSIS — Z4502 Encounter for adjustment and management of automatic implantable cardiac defibrillator: Secondary | ICD-10-CM | POA: Insufficient documentation

## 2014-01-25 DIAGNOSIS — Z7982 Long term (current) use of aspirin: Secondary | ICD-10-CM | POA: Insufficient documentation

## 2014-01-25 DIAGNOSIS — I5022 Chronic systolic (congestive) heart failure: Secondary | ICD-10-CM

## 2014-01-25 DIAGNOSIS — Z8711 Personal history of peptic ulcer disease: Secondary | ICD-10-CM | POA: Insufficient documentation

## 2014-01-25 DIAGNOSIS — M069 Rheumatoid arthritis, unspecified: Secondary | ICD-10-CM | POA: Insufficient documentation

## 2014-01-25 DIAGNOSIS — Z95810 Presence of automatic (implantable) cardiac defibrillator: Secondary | ICD-10-CM

## 2014-01-25 DIAGNOSIS — I255 Ischemic cardiomyopathy: Secondary | ICD-10-CM

## 2014-01-25 DIAGNOSIS — E785 Hyperlipidemia, unspecified: Secondary | ICD-10-CM | POA: Insufficient documentation

## 2014-01-25 DIAGNOSIS — I4729 Other ventricular tachycardia: Secondary | ICD-10-CM | POA: Insufficient documentation

## 2014-01-25 DIAGNOSIS — I10 Essential (primary) hypertension: Secondary | ICD-10-CM | POA: Insufficient documentation

## 2014-01-25 DIAGNOSIS — I509 Heart failure, unspecified: Secondary | ICD-10-CM | POA: Insufficient documentation

## 2014-01-25 HISTORY — PX: IMPLANTABLE CARDIOVERTER DEFIBRILLATOR (ICD) GENERATOR CHANGE: SHX5469

## 2014-01-25 LAB — GLUCOSE, CAPILLARY: GLUCOSE-CAPILLARY: 117 mg/dL — AB (ref 70–99)

## 2014-01-25 LAB — SURGICAL PCR SCREEN
MRSA, PCR: POSITIVE — AB
STAPHYLOCOCCUS AUREUS: POSITIVE — AB

## 2014-01-25 SURGERY — ICD GENERATOR CHANGE
Anesthesia: LOCAL

## 2014-01-25 MED ORDER — SODIUM CHLORIDE 0.9 % IV SOLN: INTRAVENOUS | Status: DC

## 2014-01-25 MED ORDER — SODIUM CHLORIDE 0.9 % IV SOLN
INTRAVENOUS | Status: DC
  Administered 2014-01-25: 12:00:00 via INTRAVENOUS

## 2014-01-25 MED ORDER — ACETAMINOPHEN 325 MG PO TABS
325.0000 mg | ORAL_TABLET | ORAL | Status: DC | PRN
  Filled 2014-01-25: qty 2

## 2014-01-25 MED ORDER — LIDOCAINE HCL (PF) 1 % IJ SOLN
INTRAMUSCULAR | Status: AC
  Filled 2014-01-25: qty 60

## 2014-01-25 MED ORDER — FENTANYL CITRATE 0.05 MG/ML IJ SOLN
INTRAMUSCULAR | Status: AC
  Filled 2014-01-25: qty 2

## 2014-01-25 MED ORDER — MUPIROCIN 2 % EX OINT
TOPICAL_OINTMENT | Freq: Two times a day (BID) | CUTANEOUS | Status: DC
  Administered 2014-01-25: 1 via NASAL
  Filled 2014-01-25 (×2): qty 22

## 2014-01-25 MED ORDER — ONDANSETRON HCL 4 MG/2ML IJ SOLN: 4.0000 mg | Freq: Four times a day (QID) | INTRAMUSCULAR | Status: DC | PRN

## 2014-01-25 MED ORDER — MIDAZOLAM HCL 5 MG/5ML IJ SOLN
INTRAMUSCULAR | Status: AC
  Filled 2014-01-25: qty 5

## 2014-01-25 NOTE — Discharge Instructions (Signed)
Pacemaker Battery Change, Care After  °Refer to this sheet in the next few weeks. These instructions provide you with information on caring for yourself after your procedure. Your health care provider may also give you more specific instructions. Your treatment has been planned according to current medical practices, but problems sometimes occur. Call your health care provider if you have any problems or questions after your procedure. °WHAT TO EXPECT AFTER THE PROCEDURE °After your procedure, it is typical to have the following sensations: °· Soreness at the pacemaker site. °HOME CARE INSTRUCTIONS  °· Keep the incision clean and dry. °· Unless advised otherwise, you may shower beginning 48 hours after your procedure. °· For the first week after the replacement, avoid stretching motions that pull at the incision site and avoid heavy exercise with the arm on the same side as the incision. °· Only take over-the-counter or prescription medicines for pain, discomfort, or fever as directed by your health care provider. °· Your health care provider will tell you when you will need to next test your pacemaker by telephone or when to return to the office for follow up for removal of stitches. °SEEK MEDICAL CARE IF:  °· You have pain at the incision site that is not relieved by over-the-counter or prescription medicine. °· There is drainage or pus from the incision site. °· There is swelling larger than a lime at the incision site. °· You develop red streaking that extends above or below the incision site. °· You feel brief, intermittent palpitations, lightheadedness, or any symptoms that you feel might be related to your heart. °SEEK IMMEDIATE MEDICAL CARE IF:  °· You experience chest pain that is different than the pain at the pacemaker site. °· Shortness of breath. °· Palpitations or irregular heart beat. °· Lightheadedness that does not go away quickly. °· Fainting. °· You have pain that gets worse and is not relieved by  medicine. °MAKE SURE YOU:  °· Understand these instructions. °· Will watch your condition. °· Will get help right away if you are not doing well or get worse. °Document Released: 05/23/2013 Document Reviewed: 02/14/2013 °[employer]® Patient Information ©2014 [employer]. ° °

## 2014-01-25 NOTE — CV Procedure (Signed)
Preoperative diagnosis  Ischemic cardiomyopathy prev ICD at [facility] Postoperative diagnosis same/   Procedure: Generator replacement  Pocket revision  Following informed consent the patient was brought to the electrophysiology laboratory in place of the fluoroscopic table in the supine position after routine prep and drape lidocaine was infiltrated in the region of the previous incision and carried down to later the device pocket using sharp dissection and electrocautery. The pocket was opened the device was freed up and was explanted.  Interrogation of the previously implanted ICD ventricular lead [employer]   demonstrated an R wave of 12.1  millivolts., and impedance of 552 ohms, and a pacing threshold of 0.7 volts at 0.5 msec.   Pacing through the HV leads 580/598   The leads were inspected. Repair was not needed. The leads were then attached to a [employer]   pulse generator, serial number [id number].    Through the ICD ventricular lead demonstrated an R wave of 12.9  millivolts., and impedance of 514 ohms, and a pacing threshold of 0.8 volts at 0.4 msec    High voltage impedances were 38 ohms  The pocket was irrigated with antibiotic containing saline solution hemostasis was assured and the leads and the device were placed in the pocket. The wound was then closed in 3  layers in normal fashion.  The patient tolerated the procedure without apparent complication.  DFT testing was not performed  Sherryl Manges   \

## 2014-02-04 ENCOUNTER — Ambulatory Visit (INDEPENDENT_AMBULATORY_CARE_PROVIDER_SITE_OTHER): Payer: Medicare Other | Admitting: *Deleted

## 2014-02-04 DIAGNOSIS — I2589 Other forms of chronic ischemic heart disease: Secondary | ICD-10-CM

## 2014-02-04 DIAGNOSIS — I5022 Chronic systolic (congestive) heart failure: Secondary | ICD-10-CM

## 2014-02-04 LAB — MDC_IDC_ENUM_SESS_TYPE_INCLINIC
Date Time Interrogation Session: 20150622040000
HIGH POWER IMPEDANCE MEASURED VALUE: 44 Ohm
Implantable Pulse Generator Serial Number: 126688
Lead Channel Pacing Threshold Pulse Width: 0.4 ms
MDC IDC MSMT LEADCHNL RV IMPEDANCE VALUE: 537 Ohm
MDC IDC MSMT LEADCHNL RV PACING THRESHOLD AMPLITUDE: 0.8 V
MDC IDC MSMT LEADCHNL RV SENSING INTR AMPL: 12.8 mV
MDC IDC SET LEADCHNL RV PACING AMPLITUDE: 2.4 V
MDC IDC SET LEADCHNL RV PACING PULSEWIDTH: 0.4 ms
MDC IDC SET LEADCHNL RV SENSING SENSITIVITY: 0.5 mV
Zone Setting Detection Interval: 250 ms
Zone Setting Detection Interval: 300 ms

## 2014-02-04 NOTE — Progress Notes (Signed)
Wound check appointment. Wound without redness or edema. Incision edges approximated, wound well healed. Normal device function. Threshold, sensing, and impedances consistent with implant measurements. Device programmed at appropriate safety margins. Histogram distribution appropriate for patient and level of activity. No ventricular arrhythmias noted. Patient educated about wound care, arm mobility, lifting restrictions, shock plan. ROV in 3 months with SK. 

## 2014-02-12 ENCOUNTER — Encounter: Payer: Medicare Other | Admitting: Internal Medicine

## 2014-02-27 ENCOUNTER — Encounter: Payer: Self-pay | Admitting: Internal Medicine

## 2014-02-28 ENCOUNTER — Other Ambulatory Visit: Payer: Self-pay | Admitting: Internal Medicine

## 2014-04-25 ENCOUNTER — Ambulatory Visit (INDEPENDENT_AMBULATORY_CARE_PROVIDER_SITE_OTHER): Payer: Medicare Other | Admitting: Internal Medicine

## 2014-04-25 ENCOUNTER — Encounter: Payer: Self-pay | Admitting: Internal Medicine

## 2014-04-25 VITALS — BP 122/68 | HR 86 | Ht 70.5 in | Wt 211.6 lb

## 2014-04-25 DIAGNOSIS — I255 Ischemic cardiomyopathy: Secondary | ICD-10-CM

## 2014-04-25 DIAGNOSIS — Z95810 Presence of automatic (implantable) cardiac defibrillator: Secondary | ICD-10-CM

## 2014-04-25 DIAGNOSIS — I2589 Other forms of chronic ischemic heart disease: Secondary | ICD-10-CM

## 2014-04-25 LAB — MDC_IDC_ENUM_SESS_TYPE_INCLINIC
Battery Remaining Longevity: 138 mo
Brady Statistic RV Percent Paced: 0 %
Date Time Interrogation Session: 20150910180100
HIGH POWER IMPEDANCE MEASURED VALUE: 50 Ohm
Implantable Pulse Generator Serial Number: 126688
Lead Channel Pacing Threshold Amplitude: 0.7 V
Lead Channel Pacing Threshold Pulse Width: 0.4 ms
Lead Channel Setting Pacing Pulse Width: 0.4 ms
MDC IDC MSMT BATTERY REMAINING PERCENTAGE: 100 %
MDC IDC MSMT LEADCHNL RV IMPEDANCE VALUE: 501 Ohm
MDC IDC SET LEADCHNL RV PACING AMPLITUDE: 2.4 V
MDC IDC SET LEADCHNL RV SENSING SENSITIVITY: 0.5 mV
Zone Setting Detection Interval: 250 ms
Zone Setting Detection Interval: 300 ms

## 2014-04-25 NOTE — Progress Notes (Signed)
Patient Care Team: [name], MD as PCP - General (Unknown Physician Specialty) [name], MD (Unknown Physician Specialty)   HPI  [name] is a 79 y.o. male Seen in followup for an ICD implanted implanted for primary prevention;  he has a history of ischemic heart disease and underwent Myoview scanning in February 210 demonstrated no ischemia and an ejection fraction of 26%. There is a large scar.   In May 2012 while working on his house in [place] he was shocked. Interrogation of his device demonstrated ventricular tachycardia.   He has had orthostatic intolerance prompting discontinuation of the lisinopril  The patient denies chest pain, shortness of breath, nocturnal dyspnea, orthopnea or peripheral edema.  There have been no palpitations, lightheadedness or syncope.   He underwent generator replacement 6/15      Past Medical History  Diagnosis Date  . Other and unspecified hyperlipidemia   . Unspecified essential hypertension   . Coronary atherosclerosis of unspecified type of vessel, native or graft   . Ventricular tachycardia     Rx via ICD 5 /2012  . Congestive heart failure, unspecified   . Rheumatoid arthritis(714.0)   . Type II or unspecified type diabetes mellitus without mention of complication, not stated as uncontrolled   . Peptic ulcer, unspecified site, unspecified as acute or chronic, without mention of hemorrhage, perforation, or obstruction   . Ischemic cardiomyopathy   . ICD (implantable cardiac defibrillator), single BSX     DOI 2008    Past Surgical History  Procedure Laterality Date  . Decomppression (other)      left median nerve  . Coronary artey bypass graft      status post  . Cardiac defibrillator placement      status post implantable    Current Outpatient Prescriptions  Medication Sig Dispense Refill  . aspirin EC 81 MG tablet Take 81 mg by mouth daily.      Marland Kitchen atorvastatin (LIPITOR) 10 MG tablet Take 10 mg by  mouth daily.        . carvedilol (COREG) 25 MG tablet TAKE ONE-HALF (1/2) TABLET (12.5 MG TOTAL) TWICE A DAY  90 tablet  1  . esomeprazole (NEXIUM) 40 MG capsule Take 40 mg by mouth daily.        . furosemide (LASIX) 20 MG tablet Take 20 mg by mouth daily.      Marland Kitchen glyBURIDE-metformin (GLUCOVANCE) 5-500 MG per tablet Take 1 tablet by mouth daily.        . insulin glargine (LANTUS) 100 UNIT/ML injection Inject 10-40 Units into the skin daily. Sliding scale.  If cbg= 110: none, cbg=200 take 25-30 units, cbg over 250 take 40 units      . lisinopril (PRINIVIL,ZESTRIL) 10 MG tablet Take 0.5 tablets (5 mg total) by mouth daily.  90 tablet  3  . meloxicam (MOBIC) 7.5 MG tablet Take 7.5 mg by mouth daily.       . Omega-3 Fatty Acids (FISH OIL PO) Take 1 capsule by mouth daily.      Marland Kitchen spironolactone (ALDACTONE) 25 MG tablet Take 0.5 tablets (12.5 mg total) by mouth daily.  90 tablet  2   No current facility-administered medications for this visit.    No Known Allergies  Review of Systems negative except from HPI and PMH  Physical Exam BP 122/68  Pulse 86  Ht 5' 10.5" (1.791 m)  Wt 211 lb 9.6 oz (95.981 kg)  BMI 29.92 kg/m2  Well developed and well nourished in no acute distress HENT normal E scleral and icterus clear Neck Supple JVP flat; carotids brisk and full Clear to ausculation  Regular rate and rhythm, no murmurs gallops or rub Soft with active bowel sounds No clubbing cyanosis no Edema Alert and oriented, grossly normal motor and sensory function Skin Warm and Dry  ECG demonstrates sinus rhythm with PACs heart rate pain Intervals for/11/41 axis left -68  Poor R-wave progression consistent with prior inferior wall MI  prior inferior wall MI  Assessment and  Plan  Ischemic cardiomyopathy S./P. CABG  Implantable defibrillator-[employer]  Ventricular tachycardia  No intercurrent Ventricular tachycardia    Overall stale   No ischemic symptoms or evidence of volume  overload  Device function normal

## 2014-04-25 NOTE — Patient Instructions (Addendum)
Your physician recommends that you continue on your current medications as directed. Please refer to the Current Medication list given to you today.  Remote monitoring is used to monitor your Pacemaker of ICD from home. This monitoring reduces the number of office visits required to check your device to one time per year. It allows [place] to keep an eye on the functioning of your device to ensure it is working properly. You are scheduled for a device check from home on 07/29/14. You may send your transmission at any time that day. If you have a wireless device, the transmission will be sent automatically. After your physician reviews your transmission, you will receive a postcard with your next transmission date.   Your physician wants you to follow-up in: 9 months with Dr. Graciela Husbands.  You will receive a reminder letter in the mail two months in advance. If you don't receive a letter, please call our office to schedule the follow-up appointment.

## 2014-04-29 ENCOUNTER — Encounter: Payer: Self-pay | Admitting: Internal Medicine

## 2014-05-09 ENCOUNTER — Other Ambulatory Visit: Payer: Self-pay | Admitting: Internal Medicine

## 2014-05-10 ENCOUNTER — Other Ambulatory Visit: Payer: Self-pay | Admitting: Cardiology

## 2014-06-10 ENCOUNTER — Other Ambulatory Visit: Payer: Self-pay | Admitting: Internal Medicine

## 2014-07-04 ENCOUNTER — Other Ambulatory Visit: Payer: Self-pay | Admitting: Cardiology

## 2014-07-24 ENCOUNTER — Other Ambulatory Visit: Payer: Self-pay | Admitting: Internal Medicine

## 2014-07-25 ENCOUNTER — Encounter (HOSPITAL_COMMUNITY): Payer: Self-pay | Admitting: Internal Medicine

## 2014-07-29 ENCOUNTER — Encounter: Payer: Medicare Other | Admitting: *Deleted

## 2014-07-29 LAB — MDC_IDC_ENUM_SESS_TYPE_REMOTE
Battery Remaining Longevity: 144 mo
Battery Remaining Percentage: 100 %
Brady Statistic RV Percent Paced: 0 %
HighPow Impedance: 52 Ohm
Implantable Pulse Generator Serial Number: 126688
Lead Channel Pacing Threshold Amplitude: 0.7 V
Lead Channel Setting Pacing Amplitude: 2.4 V
Lead Channel Setting Pacing Pulse Width: 0.4 ms
MDC IDC MSMT LEADCHNL RV IMPEDANCE VALUE: 519 Ohm
MDC IDC MSMT LEADCHNL RV PACING THRESHOLD PULSEWIDTH: 0.4 ms
MDC IDC MSMT LEADCHNL RV SENSING INTR AMPL: 14.1 mV
MDC IDC SESS DTM: 20151214063300
MDC IDC SET LEADCHNL RV SENSING SENSITIVITY: 0.5 mV
MDC IDC SET ZONE DETECTION INTERVAL: 300 ms
Zone Setting Detection Interval: 250 ms

## 2014-08-07 ENCOUNTER — Encounter: Payer: Self-pay | Admitting: *Deleted

## 2014-09-23 ENCOUNTER — Encounter: Payer: Self-pay | Admitting: Internal Medicine

## 2014-10-01 ENCOUNTER — Other Ambulatory Visit: Payer: Self-pay | Admitting: Internal Medicine

## 2014-10-02 ENCOUNTER — Other Ambulatory Visit: Payer: Self-pay | Admitting: Cardiology

## 2014-10-30 ENCOUNTER — Ambulatory Visit (INDEPENDENT_AMBULATORY_CARE_PROVIDER_SITE_OTHER): Payer: Medicare Other | Admitting: *Deleted

## 2014-10-30 DIAGNOSIS — I255 Ischemic cardiomyopathy: Secondary | ICD-10-CM

## 2014-10-30 NOTE — Progress Notes (Signed)
Remote ICD transmission.   

## 2014-10-31 LAB — MDC_IDC_ENUM_SESS_TYPE_REMOTE
Battery Remaining Percentage: 100 %
Brady Statistic RV Percent Paced: 0 %
Date Time Interrogation Session: 20160316053100
HighPow Impedance: 48 Ohm
Implantable Pulse Generator Serial Number: 126688
Lead Channel Impedance Value: 529 Ohm
Lead Channel Pacing Threshold Amplitude: 0.7 V
Lead Channel Setting Pacing Pulse Width: 0.4 ms
Lead Channel Setting Sensing Sensitivity: 0.5 mV
MDC IDC MSMT BATTERY REMAINING LONGEVITY: 144 mo
MDC IDC MSMT LEADCHNL RV PACING THRESHOLD PULSEWIDTH: 0.4 ms
MDC IDC SET LEADCHNL RV PACING AMPLITUDE: 2.4 V
MDC IDC SET ZONE DETECTION INTERVAL: 300 ms
Zone Setting Detection Interval: 250 ms

## 2014-11-19 ENCOUNTER — Encounter: Payer: Self-pay | Admitting: Cardiology

## 2014-11-27 ENCOUNTER — Encounter: Payer: Self-pay | Admitting: Internal Medicine

## 2014-12-31 ENCOUNTER — Other Ambulatory Visit: Payer: Self-pay | Admitting: Cardiology

## 2015-01-18 ENCOUNTER — Other Ambulatory Visit: Payer: Self-pay | Admitting: Internal Medicine

## 2015-01-31 ENCOUNTER — Other Ambulatory Visit: Payer: Self-pay | Admitting: Internal Medicine

## 2015-02-02 ENCOUNTER — Other Ambulatory Visit: Payer: Self-pay | Admitting: Internal Medicine

## 2015-02-18 ENCOUNTER — Encounter: Payer: Self-pay | Admitting: *Deleted

## 2015-02-21 ENCOUNTER — Encounter: Payer: Self-pay | Admitting: Internal Medicine

## 2015-02-21 ENCOUNTER — Ambulatory Visit (INDEPENDENT_AMBULATORY_CARE_PROVIDER_SITE_OTHER): Payer: Medicare Other | Admitting: Internal Medicine

## 2015-02-21 VITALS — BP 86/52 | HR 83 | Ht 69.5 in | Wt 214.6 lb

## 2015-02-21 DIAGNOSIS — I255 Ischemic cardiomyopathy: Secondary | ICD-10-CM

## 2015-02-21 DIAGNOSIS — I472 Ventricular tachycardia, unspecified: Secondary | ICD-10-CM

## 2015-02-21 DIAGNOSIS — Z79899 Other long term (current) drug therapy: Secondary | ICD-10-CM | POA: Diagnosis not present

## 2015-02-21 DIAGNOSIS — Z45018 Encounter for adjustment and management of other part of cardiac pacemaker: Secondary | ICD-10-CM | POA: Diagnosis not present

## 2015-02-21 LAB — CUP PACEART INCLINIC DEVICE CHECK
HIGH POWER IMPEDANCE MEASURED VALUE: 47 Ohm
Lead Channel Impedance Value: 513 Ohm
Lead Channel Pacing Threshold Amplitude: 0.9 V
Lead Channel Pacing Threshold Pulse Width: 0.4 ms
Lead Channel Setting Sensing Sensitivity: 0.5 mV
MDC IDC MSMT LEADCHNL RV SENSING INTR AMPL: 16.7 mV
MDC IDC SESS DTM: 20160708040000
MDC IDC SET LEADCHNL RV PACING AMPLITUDE: 2.4 V
MDC IDC SET LEADCHNL RV PACING PULSEWIDTH: 0.4 ms
Pulse Gen Serial Number: 126688
Zone Setting Detection Interval: 250 ms
Zone Setting Detection Interval: 300 ms

## 2015-02-21 LAB — COMPREHENSIVE METABOLIC PANEL
ALBUMIN: 4 g/dL (ref 3.5–5.2)
ALK PHOS: 91 U/L (ref 39–117)
ALT: 10 U/L (ref 0–53)
AST: 14 U/L (ref 0–37)
BUN: 22 mg/dL (ref 6–23)
CHLORIDE: 100 meq/L (ref 96–112)
CO2: 25 mEq/L (ref 19–32)
Calcium: 9.3 mg/dL (ref 8.4–10.5)
Creatinine, Ser: 1.37 mg/dL (ref 0.40–1.50)
GFR: 53.41 mL/min — ABNORMAL LOW (ref >60.00)
Glucose, Bld: 186 mg/dL — ABNORMAL HIGH (ref 70–99)
POTASSIUM: 4.7 meq/L (ref 3.5–5.1)
SODIUM: 132 meq/L — AB (ref 135–145)
Total Bilirubin: 0.4 mg/dL (ref 0.2–1.2)
Total Protein: 6.8 g/dL (ref 6.0–8.3)

## 2015-02-21 LAB — CBC WITH DIFFERENTIAL/PLATELET
Basophils Absolute: 0 10*3/uL (ref 0.0–0.1)
Basophils Relative: 0.5 % (ref 0.0–3.0)
EOS ABS: 0.1 10*3/uL (ref 0.0–0.7)
EOS PCT: 2.8 % (ref 0.0–5.0)
HEMATOCRIT: 33.2 % — AB (ref 39.0–52.0)
HEMOGLOBIN: 11.2 g/dL — AB (ref 13.0–17.0)
Lymphocytes Relative: 32 % (ref 12.0–46.0)
Lymphs Abs: 1.5 10*3/uL (ref 0.7–4.0)
MCHC: 33.7 g/dL (ref 30.0–36.0)
MCV: 82 fl (ref 78.0–100.0)
Monocytes Absolute: 0.4 10*3/uL (ref 0.1–1.0)
Monocytes Relative: 9.1 % (ref 3.0–12.0)
Neutro Abs: 2.6 10*3/uL (ref 1.4–7.7)
Neutrophils Relative %: 55.6 % (ref 43.0–77.0)
Platelets: 195 10*3/uL (ref 150.0–400.0)
RBC: 4.05 Mil/uL — AB (ref 4.22–5.81)
RDW: 13.2 % (ref 11.5–15.5)
WBC: 4.7 10*3/uL (ref 4.0–10.5)

## 2015-02-21 LAB — TSH: TSH: 7.75 u[IU]/mL — AB (ref 0.35–4.50)

## 2015-02-21 MED ORDER — CARVEDILOL 6.25 MG PO TABS: 6.2500 mg | ORAL_TABLET | Freq: Two times a day (BID) | ORAL | Status: DC

## 2015-02-21 NOTE — Patient Instructions (Signed)
Medication Instructions:  Your physician has recommended you make the following change in your medication:  1) DECREASE Carvedilol to 6.25 mg twice a day  Labwork: Today: CBCD, TSH, CMET  Testing/Procedures: None ordered  Follow-Up: Remote monitoring is used to monitor your Pacemaker of ICD from home. This monitoring reduces the number of office visits required to check your device to one time per year. It allows [place] to keep an eye on the functioning of your device to ensure it is working properly. You are scheduled for a device check from home on 05/26/15. You may send your transmission at any time that day. If you have a wireless device, the transmission will be sent automatically. After your physician reviews your transmission, you will receive a postcard with your next transmission date.  Your physician wants you to follow-up in: 1 year with Dr. Graciela Husbands.  You will receive a reminder letter in the mail two months in advance. If you don't receive a letter, please call our office to schedule the follow-up appointment.   Any Other Special Instructions Will Be Listed Below (If Applicable). Thank you for choosing [facility]!!

## 2015-02-21 NOTE — Progress Notes (Signed)
Patient Care Team: [name], MD as PCP - General (Unknown Physician Specialty) [name], MD (Unknown Physician Specialty)   HPI  [name] is a 80 y.o. male Seen in followup for an ICD implanted implanted for primary prevention;  he has a history of ischemic heart disease and underwent Myoview scanning in February 210 demonstrated no ischemia and an ejection fraction of 26%. There is a large scar.   In May 2012 while working on his house in [place] he was shocked. Interrogation of his device demonstrated ventricular tachycardia.   He has had orthostatic intolerance prompting discontinuation of the lisinopril;  this is been resumed. His blood pressure is low today. He denies significant lightheadedness. He has noted no change in stool color or odor.   The patient denies chest pain, shortness of breath, nocturnal dyspnea, orthopnea or peripheral edema.  There have been no palpitations, lightheadedness or syncope.   He underwent generator replacement 6/15      Past Medical History  Diagnosis Date  . Other and unspecified hyperlipidemia   . Unspecified essential hypertension   . Coronary atherosclerosis of unspecified type of vessel, native or graft   . Ventricular tachycardia     Rx via ICD 5 /2012  . Congestive heart failure, unspecified   . Rheumatoid arthritis(714.0)   . Type II or unspecified type diabetes mellitus without mention of complication, not stated as uncontrolled   . Peptic ulcer, unspecified site, unspecified as acute or chronic, without mention of hemorrhage, perforation, or obstruction   . Ischemic cardiomyopathy   . ICD (implantable cardiac defibrillator), single BSX     DOI 2008    Past Surgical History  Procedure Laterality Date  . Decomppression (other)      left median nerve  . Coronary artey bypass graft      status post  . Cardiac defibrillator placement      status post implantable  . Implantable cardioverter defibrillator  (icd) generator change N/A 01/25/2014    Procedure: ICD GENERATOR CHANGE;  Surgeon: Duke Salvia, MD;  Location[facility] CATH LAB;  Service: Cardiovascular;  Laterality: N/A;    Current Outpatient Prescriptions  Medication Sig Dispense Refill  . aspirin EC 81 MG tablet Take 81 mg by mouth daily.    Marland Kitchen atorvastatin (LIPITOR) 10 MG tablet Take 10 mg by mouth daily.      . carvedilol (COREG) 25 MG tablet TAKE ONE-HALF (1/2) TABLET TWICE A DAY (PLEASE CALL AND SCHEDULE AN APPOINTMENT) 45 tablet 3  . esomeprazole (NEXIUM) 40 MG capsule Take 40 mg by mouth daily.      . furosemide (LASIX) 20 MG tablet TAKE 1 TABLET DAILY (CALL TO MAKE APPOINTMENT WITH DR.CRENSHAW FOR FURTHER REFILLS) 90 tablet 0  . glyBURIDE-metformin (GLUCOVANCE) 5-500 MG per tablet Take 1 tablet by mouth daily.      . insulin glargine (LANTUS) 100 UNIT/ML injection Inject 10-40 Units into the skin daily. Sliding scale.  If cbg= 110: none, cbg=200 take 25-30 units, cbg over 250 take 40 units    . lisinopril (PRINIVIL,ZESTRIL) 10 MG tablet TAKE ONE-HALF (1/2) TABLET (5 MG) DAILY 45 tablet 1  . meloxicam (MOBIC) 7.5 MG tablet Take 7.5 mg by mouth daily.     . Omega-3 Fatty Acids (FISH OIL PO) Take 1 capsule by mouth daily.    Marland Kitchen spironolactone (ALDACTONE) 25 MG tablet TAKE ONE-HALF (1/2) TABLET DAILY 45 tablet 3   No current facility-administered medications for this  visit.    No Known Allergies  Review of Systems negative except from HPI and PMH  Physical Exam BP 86/52 mmHg  Pulse 83  Ht 5' 9.5" (1.765 m)  Wt 214 lb 9.6 oz (97.342 kg)  BMI 31.25 kg/m2 Well developed and well nourished in no acute distress sitting in a motorized wheelchair HENT normal E scleral and icterus clear Neck Supple JVP flat; carotids brisk and full Clear to ausculation  Regular rate and rhythm, no murmurs gallops or rub Soft with active bowel sounds No clubbing cyanosis trace Edema Alert and oriented, grossly normal motor and sensory  function Skin Warm and Dry  ECG demonstrates sinus rhythm at 83 Intervals 16/11/40 Axis CXCIV Inferolateral infarct    Assessment and  Plan  Ischemic cardiomyopathy S./P. CABG  Implantable defibrillator-[employer]  Hypotension  Ventricular tachycardia  No intercurrent Ventricular tachycardia    Overall stale   No ischemic symptoms or evidence of volume overload  Device function normal  With hypotension, we will decrease his carvedilol from 12.5--6.25 twice daily. We will also check thyroid hemoglobin and metabolic profile. We will need to forward these results to Dr. Lorin Picket

## 2015-03-03 ENCOUNTER — Telehealth: Payer: Self-pay | Admitting: Internal Medicine

## 2015-03-03 NOTE — Telephone Encounter (Signed)
New message      Please fax most recent labs to attn Dr Lorin Picket [phone]

## 2015-03-04 ENCOUNTER — Encounter: Payer: Self-pay | Admitting: Internal Medicine

## 2015-03-28 ENCOUNTER — Other Ambulatory Visit: Payer: Self-pay | Admitting: Internal Medicine

## 2015-03-29 ENCOUNTER — Other Ambulatory Visit: Payer: Self-pay | Admitting: Internal Medicine

## 2015-05-26 ENCOUNTER — Ambulatory Visit (INDEPENDENT_AMBULATORY_CARE_PROVIDER_SITE_OTHER): Payer: Medicare Other | Admitting: *Deleted

## 2015-05-26 ENCOUNTER — Encounter: Payer: Self-pay | Admitting: Internal Medicine

## 2015-05-26 DIAGNOSIS — I255 Ischemic cardiomyopathy: Secondary | ICD-10-CM

## 2015-05-27 NOTE — Progress Notes (Signed)
Remote ICD transmission.   

## 2015-05-30 LAB — CUP PACEART REMOTE DEVICE CHECK
Battery Remaining Longevity: 144 mo
Battery Remaining Percentage: 100 %
Date Time Interrogation Session: 20161010053100
HighPow Impedance: 49 Ohm
Implantable Lead Implant Date: 20080415
Implantable Lead Location: 753860
Implantable Lead Model: 158
Implantable Lead Serial Number: 176041
Lead Channel Sensing Intrinsic Amplitude: 13.5 mV
Lead Channel Setting Pacing Amplitude: 2.4 V
Lead Channel Setting Pacing Pulse Width: 0.4 ms
MDC IDC MSMT LEADCHNL RV IMPEDANCE VALUE: 532 Ohm
MDC IDC PG SERIAL: 126688
MDC IDC SET LEADCHNL RV SENSING SENSITIVITY: 0.5 mV
MDC IDC SET ZONE DETECTION INTERVAL: 250 ms
MDC IDC SET ZONE DETECTION INTERVAL: 300 ms
MDC IDC SET ZONE VENDOR TYPE: 771137
MDC IDC STAT BRADY RV PERCENT PACED: 0 %
Zone Setting Vendor Type Category: 771139

## 2015-07-09 ENCOUNTER — Encounter: Payer: Self-pay | Admitting: *Deleted

## 2015-08-25 ENCOUNTER — Ambulatory Visit (INDEPENDENT_AMBULATORY_CARE_PROVIDER_SITE_OTHER): Payer: Medicare Other | Admitting: *Deleted

## 2015-08-25 DIAGNOSIS — I255 Ischemic cardiomyopathy: Secondary | ICD-10-CM

## 2015-08-25 NOTE — Progress Notes (Signed)
Remote ICD transmission.   

## 2015-09-16 LAB — CUP PACEART REMOTE DEVICE CHECK
Brady Statistic RV Percent Paced: 0 %
Date Time Interrogation Session: 20170109063100
HIGH POWER IMPEDANCE MEASURED VALUE: 49 Ohm
Implantable Lead Model: 158
Implantable Lead Serial Number: 176041
Lead Channel Impedance Value: 507 Ohm
Lead Channel Pacing Threshold Amplitude: 0.9 V
Lead Channel Setting Pacing Amplitude: 2.4 V
MDC IDC LEAD IMPLANT DT: 20080415
MDC IDC LEAD LOCATION: 753860
MDC IDC MSMT BATTERY REMAINING LONGEVITY: 144 mo
MDC IDC MSMT BATTERY REMAINING PERCENTAGE: 100 %
MDC IDC MSMT LEADCHNL RV PACING THRESHOLD PULSEWIDTH: 0.4 ms
MDC IDC PG SERIAL: 126688
MDC IDC SET LEADCHNL RV PACING PULSEWIDTH: 0.4 ms
MDC IDC SET LEADCHNL RV SENSING SENSITIVITY: 0.5 mV

## 2015-09-17 ENCOUNTER — Encounter: Payer: Self-pay | Admitting: Cardiology

## 2015-11-24 ENCOUNTER — Telehealth: Payer: Self-pay | Admitting: Cardiology

## 2015-11-24 ENCOUNTER — Ambulatory Visit (INDEPENDENT_AMBULATORY_CARE_PROVIDER_SITE_OTHER): Payer: Medicare Other | Admitting: *Deleted

## 2015-11-24 DIAGNOSIS — I255 Ischemic cardiomyopathy: Secondary | ICD-10-CM

## 2015-11-24 NOTE — Progress Notes (Signed)
Remote ICD transmission.   

## 2015-11-24 NOTE — Telephone Encounter (Signed)
LMOVM reminding pt to send remote transmission.   

## 2015-12-24 ENCOUNTER — Other Ambulatory Visit: Payer: Self-pay | Admitting: Internal Medicine

## 2016-01-11 LAB — CUP PACEART REMOTE DEVICE CHECK
Brady Statistic RV Percent Paced: 0 %
Date Time Interrogation Session: 20170410053100
HighPow Impedance: 51 Ohm
Implantable Lead Model: 158
Lead Channel Impedance Value: 495 Ohm
Lead Channel Pacing Threshold Amplitude: 0.9 V
Lead Channel Setting Pacing Amplitude: 2.4 V
MDC IDC LEAD IMPLANT DT: 20080415
MDC IDC LEAD LOCATION: 753860
MDC IDC LEAD SERIAL: 176041
MDC IDC MSMT BATTERY REMAINING LONGEVITY: 138 mo
MDC IDC MSMT BATTERY REMAINING PERCENTAGE: 100 %
MDC IDC MSMT LEADCHNL RV PACING THRESHOLD PULSEWIDTH: 0.4 ms
MDC IDC SET LEADCHNL RV PACING PULSEWIDTH: 0.4 ms
MDC IDC SET LEADCHNL RV SENSING SENSITIVITY: 0.5 mV
Pulse Gen Serial Number: 126688

## 2016-01-11 NOTE — Progress Notes (Signed)
Normal remote reviewed.  Next follow up 02/2016 SK

## 2016-01-16 ENCOUNTER — Encounter: Payer: Self-pay | Admitting: Cardiology

## 2016-02-02 ENCOUNTER — Other Ambulatory Visit: Payer: Self-pay | Admitting: Internal Medicine

## 2016-03-04 ENCOUNTER — Ambulatory Visit (INDEPENDENT_AMBULATORY_CARE_PROVIDER_SITE_OTHER): Payer: Medicare Other | Admitting: Internal Medicine

## 2016-03-04 ENCOUNTER — Encounter (INDEPENDENT_AMBULATORY_CARE_PROVIDER_SITE_OTHER): Payer: Self-pay

## 2016-03-04 ENCOUNTER — Encounter: Payer: Self-pay | Admitting: Internal Medicine

## 2016-03-04 VITALS — BP 100/62 | HR 80 | Ht 70.0 in | Wt 204.8 lb

## 2016-03-04 DIAGNOSIS — I255 Ischemic cardiomyopathy: Secondary | ICD-10-CM

## 2016-03-04 DIAGNOSIS — I472 Ventricular tachycardia, unspecified: Secondary | ICD-10-CM

## 2016-03-04 DIAGNOSIS — Z95810 Presence of automatic (implantable) cardiac defibrillator: Secondary | ICD-10-CM

## 2016-03-04 NOTE — Progress Notes (Signed)
Patient Care Team: [name], MD as PCP - General (Unknown Physician Specialty) [name], MD (Unknown Physician Specialty)   HPI  [name] is a 81 y.o. male Seen in followup for an ICD implanted implanted for primary prevention;  he has a history of ischemic heart disease and underwent Myoview scanning in February 210 demonstrated no ischemia and an ejection fraction of 26%. There is a large scar.   In May 2012 while working on his house in [place] he was shocked. Interrogation of his device demonstrated ventricular tachycardia.   He has had orthostatic intolerance prompting discontinuation of the lisinopril;  this is been resumed. His blood pressure is low today. He denies significant lightheadedness. He has noted no change in stool color or odor.   The patient denies chest pain, shortness of breath, nocturnal dyspnea, orthopnea or peripheral edema.  There have been no palpitations, lightheadedness or syncope.   He underwent generator replacement 6/15      Past Medical History  Diagnosis Date  . Other and unspecified hyperlipidemia   . Unspecified essential hypertension   . Coronary atherosclerosis of unspecified type of vessel, native or graft   . Ventricular tachycardia (HCC)     Rx via ICD 5 /2012  . Congestive heart failure, unspecified   . Rheumatoid arthritis(714.0)   . Type II or unspecified type diabetes mellitus without mention of complication, not stated as uncontrolled   . Peptic ulcer, unspecified site, unspecified as acute or chronic, without mention of hemorrhage, perforation, or obstruction   . Ischemic cardiomyopathy   . ICD (implantable cardiac defibrillator), single BSX     DOI 2008    Past Surgical History  Procedure Laterality Date  . Decomppression (other)      left median nerve  . Coronary artey bypass graft      status post  . Cardiac defibrillator placement      status post implantable  . Implantable cardioverter  defibrillator (icd) generator change N/A 01/25/2014    Procedure: ICD GENERATOR CHANGE;  Surgeon: Duke Salvia, MD;  Location: [facility] CATH LAB;  Service: Cardiovascular;  Laterality: N/A;    Current Outpatient Prescriptions  Medication Sig Dispense Refill  . ACCU-CHEK AVIVA PLUS test strip CHECK BLOOD SUGAR TWICE A DAY  1  . aspirin EC 81 MG tablet Take 81 mg by mouth daily.    Marland Kitchen atorvastatin (LIPITOR) 10 MG tablet Take 10 mg by mouth daily.      . carvedilol (COREG) 6.25 MG tablet Take 6.25 mg by mouth 2 (two) times daily with a meal. (DECREASED DOSAGE)    . esomeprazole (NEXIUM) 40 MG capsule Take 40 mg by mouth daily.      . furosemide (LASIX) 20 MG tablet TAKE 1/2 TABLET BY MOUTH DAILY  Pt unsure if he has a 20 mg or 40 mg tablet that he cuts in half. He will call us tomorrow with correct strength. (03/04/16)    . glyBURIDE-metformin (GLUCOVANCE) 5-500 MG per tablet Take 1 tablet by mouth daily.      . insulin glargine (LANTUS) 100 UNIT/ML injection Inject 10-40 Units into the skin daily. Sliding scale.  If cbg= 110: none, cbg=200 take 25-30 units, cbg over 250 take 40 units    . lisinopril (PRINIVIL,ZESTRIL) 10 MG tablet TAKE ONE-HALF (1/2) TABLET BY MOUTH DAILY    . meloxicam (MOBIC) 7.5 MG tablet Take 7.5 mg by mouth daily.     Marland Kitchen spironolactone (ALDACTONE) 25  MG tablet TAKE ONE-HALF (1/2) TABLET BY MOUTH DAILY    . Omega-3 Fatty Acids (FISH OIL PO) Take 1 capsule by mouth daily. Reported on 03/04/2016     No current facility-administered medications for this visit.    No Known Allergies  Review of Systems negative except from HPI and PMH  Physical Exam BP 100/62 mmHg  Pulse 80  Ht 5\' 10"  (1.778 m)  Wt 204 lb 12.8 oz (92.897 kg)  BMI 29.39 kg/m2  SpO2 99% Well developed and well nourished in no acute distress sitting in a motorized wheelchair HENT normal E scleral and icterus clear Neck Supple JVP flat; carotids brisk and full Clear to ausculation  Regular rate and rhythm, no  murmurs gallops or rub Soft with active bowel sounds No clubbing cyanosis trace Edema Alert and oriented, grossly normal motor and sensory function Skin Warm and Dry  ECG demonstrates sinus rhythm at 83 Intervals 16/11/40 Axis CXCIV Inferolateral infarct    Assessment and  Plan  Ischemic cardiomyopathy S./P. CABG  Implantable defibrillator-[employer]  Congestive heart failure-chronic-systolic  Ventricular tachycardia  No intercurrent Ventricular tachycardia    Overall stable  No ischemic symptoms   Device function normal  With edema we'll have him take an extra Lasix as he needs.  We do not have blood work on his high risk medications. We will have Dr. 18/11/40 symptoms blood work when he draws at next week

## 2016-03-04 NOTE — Patient Instructions (Addendum)
Medication Instructions:  Your physician recommends that you continue on your current medications as directed. Please refer to the Current Medication list given to you today.   Labwork: None ordered   Testing/Procedures: None ordered   Follow-Up: Your physician wants you to follow-up in: 6 months with Dr. [name] and 12 months with Gypsy Balsam, NP. You will receive a reminder letter in the mail two months in advance. If you don't receive a letter, please call our office to schedule the follow-up appointment.   Any Other Special Instructions Will Be Listed Below (If Applicable).  Please call us tomorrow with the correct strength of your Lasix (20 mg or 40 mg tablet)  [phone]    If you need a refill on your cardiac medications before your next appointment, please call your pharmacy.

## 2016-03-05 ENCOUNTER — Telehealth: Payer: Self-pay | Admitting: Internal Medicine

## 2016-03-05 NOTE — Telephone Encounter (Signed)
Pt confirmed lasix dose is 1/2 of a 20mg  tablet daiy

## 2016-03-05 NOTE — Telephone Encounter (Signed)
New message    Pt c/o medication issue:  1. Name of Medication: Lasix   2. How are you currently taking this medication (dosage and times per day)? 20 mg po  3. Are you having a reaction (difficulty breathing--STAT)? no  4. What is your medication issue? The pt was instructed to call back and let us know his Lasix is 20 mg po instead of 40 mg

## 2016-03-11 LAB — CUP PACEART INCLINIC DEVICE CHECK
HIGH POWER IMPEDANCE MEASURED VALUE: 46 Ohm
Implantable Lead Implant Date: 20080415
Lead Channel Impedance Value: 454 Ohm
Lead Channel Setting Pacing Amplitude: 2.4 V
Lead Channel Setting Pacing Pulse Width: 0.4 ms
Lead Channel Setting Sensing Sensitivity: 0.5 mV
MDC IDC LEAD LOCATION: 753860
MDC IDC LEAD MODEL: 158
MDC IDC LEAD SERIAL: 176041
MDC IDC MSMT LEADCHNL RV PACING THRESHOLD AMPLITUDE: 0.9 V
MDC IDC MSMT LEADCHNL RV PACING THRESHOLD PULSEWIDTH: 0.4 ms
MDC IDC MSMT LEADCHNL RV SENSING INTR AMPL: 13.2 mV
MDC IDC SESS DTM: 20170720040000
Pulse Gen Serial Number: 126688

## 2016-03-24 ENCOUNTER — Other Ambulatory Visit: Payer: Self-pay

## 2016-03-24 MED ORDER — LISINOPRIL 10 MG PO TABS: 5.0000 mg | ORAL_TABLET | Freq: Every day | ORAL | 3 refills | Status: DC

## 2016-03-25 ENCOUNTER — Other Ambulatory Visit: Payer: Self-pay | Admitting: Internal Medicine

## 2016-03-26 NOTE — Telephone Encounter (Signed)
Spoke with patient and he stated that he has continued to take the 10 mg qd. He did have to take an extra half at one point last month, but he resumed the 10 mg and has not had any issues. He will call us should any issues arise and if he feels that the dose needs to be adjusted. He is aware that I will approve the rx for 10 mg qd. He thanked me for following up with him on this before sending the rx.

## 2016-03-26 NOTE — Telephone Encounter (Signed)
Per phone note documentation- from 7/21.  The patient called back and confirmed his furosemide dose was 20 mg 1/2 tablet (10 mg) once daily. No further changes since this time.  Please call the patient and ask if he is still doing this and if he has needed any extra for swelling.

## 2016-03-26 NOTE — Telephone Encounter (Signed)
Please advise on sig as last office note states per Dr Graciela Husbands, we will have him take an extra lasix as he needs. Thanks, MI

## 2016-05-02 ENCOUNTER — Other Ambulatory Visit: Payer: Self-pay | Admitting: Internal Medicine

## 2016-05-17 ENCOUNTER — Encounter: Payer: Medicare Other | Admitting: Internal Medicine

## 2016-06-03 ENCOUNTER — Ambulatory Visit (INDEPENDENT_AMBULATORY_CARE_PROVIDER_SITE_OTHER): Payer: Medicare Other | Admitting: *Deleted

## 2016-06-03 DIAGNOSIS — I255 Ischemic cardiomyopathy: Secondary | ICD-10-CM

## 2016-06-03 NOTE — Progress Notes (Signed)
Remote ICD transmission.   

## 2016-06-04 ENCOUNTER — Encounter: Payer: Self-pay | Admitting: Cardiology

## 2016-06-29 LAB — CUP PACEART REMOTE DEVICE CHECK
Battery Remaining Longevity: 132 mo
Date Time Interrogation Session: 20171019053100
HIGH POWER IMPEDANCE MEASURED VALUE: 49 Ohm
Implantable Lead Serial Number: 176041
Implantable Pulse Generator Implant Date: 20150612
Lead Channel Impedance Value: 452 Ohm
Lead Channel Pacing Threshold Amplitude: 0.9 V
Lead Channel Pacing Threshold Pulse Width: 0.4 ms
Lead Channel Setting Pacing Amplitude: 2.4 V
Lead Channel Setting Pacing Pulse Width: 0.4 ms
MDC IDC LEAD IMPLANT DT: 20080415
MDC IDC LEAD LOCATION: 753860
MDC IDC LEAD MODEL: 158
MDC IDC MSMT BATTERY REMAINING PERCENTAGE: 100 %
MDC IDC SET LEADCHNL RV SENSING SENSITIVITY: 0.5 mV
MDC IDC STAT BRADY RV PERCENT PACED: 0 %
Pulse Gen Serial Number: 126688

## 2016-07-26 ENCOUNTER — Telehealth: Payer: Self-pay | Admitting: Cardiology

## 2016-07-26 NOTE — Telephone Encounter (Signed)
Patient wants to know if he should be seen.  He said that he has be experiencing SOB and "fluid retention".  Please call patient to advise.

## 2016-07-26 NOTE — Progress Notes (Signed)
HPI: FU coronary disease, status post coronary artery bypassing graft, ischemic cardiomyopathy, hypertension, diabetes, and hyperlipidemia. His most recent Myoview was performed in Jan 2012. At that time, his ejection fraction was 31%. There was prior apical, inferoseptal and inferior infarction, but there was no ischemia. He also has had a previous defibrillator placed. Echocardiogram in November of 2013 showed an ejection fraction of 45-50%. There was grade 1 diastolic dysfunction. There was moderate left atrial and mild right ventricular/right atrial enlargement. Since last seen, patient notes increased dyspnea on exertion for 6 months. Also orthopnea, increased abdominal girth and mild pedal edema. No chest pain, palpitations or syncope.  Current Outpatient Prescriptions  Medication Sig Dispense Refill  . ACCU-CHEK AVIVA PLUS test strip CHECK BLOOD SUGAR TWICE A DAY  1  . aspirin EC 81 MG tablet Take 81 mg by mouth daily.    Marland Kitchen atorvastatin (LIPITOR) 10 MG tablet Take 10 mg by mouth daily.      . carvedilol (COREG) 6.25 MG tablet TAKE 1 TABLET TWICE A DAY WITH MEALS 180 tablet 3  . esomeprazole (NEXIUM) 40 MG capsule Take 40 mg by mouth daily.      . furosemide (LASIX) 20 MG tablet Take 0.5 tablets (10 mg total) by mouth daily. 45 tablet 3  . glyBURIDE-metformin (GLUCOVANCE) 5-500 MG per tablet Take 1 tablet by mouth daily.      . insulin glargine (LANTUS) 100 UNIT/ML injection Inject 10-40 Units into the skin daily. Sliding scale.  If cbg= 110: none, cbg=200 take 25-30 units, cbg over 250 take 40 units    . lisinopril (PRINIVIL,ZESTRIL) 10 MG tablet Take 0.5 tablets (5 mg total) by mouth daily. TAKE ONE-HALF (1/2) TABLET BY MOUTH DAILY 45 tablet 3  . Omega-3 Fatty Acids (FISH OIL PO) Take 1 capsule by mouth daily. Reported on 03/04/2016    . spironolactone (ALDACTONE) 25 MG tablet TAKE ONE-HALF (1/2) TABLET DAILY 45 tablet 10   No current facility-administered medications for this visit.       Past Medical History:  Diagnosis Date  . Congestive heart failure, unspecified   . Coronary atherosclerosis of unspecified type of vessel, native or graft   . ICD (implantable cardiac defibrillator), single BSX    DOI 2008  . Ischemic cardiomyopathy   . Other and unspecified hyperlipidemia   . Peptic ulcer, unspecified site, unspecified as acute or chronic, without mention of hemorrhage, perforation, or obstruction   . Rheumatoid arthritis(714.0)   . Type II or unspecified type diabetes mellitus without mention of complication, not stated as uncontrolled   . Unspecified essential hypertension   . Ventricular tachycardia (HCC)    Rx via ICD 5 /2012    Past Surgical History:  Procedure Laterality Date  . CARDIAC DEFIBRILLATOR PLACEMENT     status post implantable  . coronary artey bypass graft     status post  . decomppression (other)     left median nerve  . IMPLANTABLE CARDIOVERTER DEFIBRILLATOR (ICD) GENERATOR CHANGE N/A 01/25/2014   Procedure: ICD GENERATOR CHANGE;  Surgeon: Duke Salvia, MD;  Location: [facility] CATH LAB;  Service: Cardiovascular;  Laterality: N/A;    Social History   Social History  . Marital status: Married    Spouse name: N/A  . Number of children: N/A  . Years of education: N/A   Occupational History  . Not on file.   Social History Main Topics  . Smoking status: Never Smoker  . Smokeless tobacco: Never Used  .  Alcohol use No  . Drug use: No  . Sexual activity: Not on file   Other Topics Concern  . Not on file   Social History Narrative   Married. Has 1 surviving child and 4 surviving grandchildren.     Family History  Problem Relation Age of Onset  . Coronary artery disease Sister   . Coronary artery disease Brother     ROS: no fevers or chills, productive cough, hemoptysis, dysphasia, odynophagia, melena, hematochezia, dysuria, hematuria, rash, seizure activity, orthopnea, PND, claudication. Remaining systems are  negative.  Physical Exam: Well-developed well-nourished in no acute distress.  Skin is warm and dry.  HEENT is normal.  Neck is supple.  Chest is clear to auscultation with normal expansion.  Cardiovascular exam is regular rate and rhythm.  Abdominal exam increased abdominal distention. Extremities show no edema. neuro grossly intact  ECG-sinus rhythm at a rate of 98. Occasional PVCs. Left axis deviation. Anterior infarct. Lateral T-wave inversion.  A/P  1 coronary artery disease-continue aspirin and statin.  2 hyperlipidemia-continue statin. Lipids and liver monitored by primary care.  3 ICD-monitored by electrophysiology.  4 hypertension-blood pressure borderline. Decrease carvedilol to 3.125 mg twice a day and lisinopril to 2.5 mg daily.  5 ischemic cardiomyopathy-continue ACE inhibitor and beta blocker with reduced dose as outlined above. Blood pressure is borderline.  6 acute on chronic chronic systolic congestive heart failure-patient is volume overloaded on examination and is short of breath. We discussed admission for IV diuresis but he would prefer to be at home if possible. Check renal function, potassium and BNP. Increase Lasix to 80 mg twice a day for 2 days and then 40 mg twice a day thereafter. Patient will see one of our extenders next week. If no improvement would likely require admission for IV diuresis. Check potassium and renal function at that time.  Olga Millers, MD

## 2016-07-26 NOTE — Telephone Encounter (Signed)
Spoke with pt, he feels he is retaining fluid in his abdomen. He feels bloated and feels like it is making him have trouble breathing. He could not lay flat last night, he had to sit up to be able to breath. He is not weighing like he should so he can not report any weight gain. Offered patient an appointment today but he has no transportation and can not come until Thursday. Follow up scheduled with dr [name] Thursday this week. Patient reports he has metolazone as well as furosemide that he has not taken yet today. Patient instructed to take the metolazone and then in 30 minutes take 20 mg of furosemide. He will call back tomorrow if he continues to have orthopnea. Pt agreed with this plan.

## 2016-07-27 ENCOUNTER — Telehealth: Payer: Self-pay | Admitting: Cardiology

## 2016-07-27 NOTE — Telephone Encounter (Signed)
Pt notified he states that he will be here Thursday for follow up appt

## 2016-07-27 NOTE — Telephone Encounter (Signed)
Pt said he was supposed to call back today and tell [name] how the medicine did.

## 2016-07-27 NOTE — Telephone Encounter (Signed)
Spoke with pt he states that his weight is down to 208 and currently is not SOB he states that he slept well last night until about 5am- in the bed and then he went to the recliner and slept an additional 2-3 hours. So he feels well rested and is inquiring if he should continue the metolazone and the lasix 20mg . Direction yesterday was: Patient instructed to take the metolazone and then in 30 minutes take 20 mg of furosemide.   Please advise on how to continue on medication

## 2016-07-27 NOTE — Telephone Encounter (Signed)
Continue diuretic regimen he typically takes and fu with me Thursday as scheduled. Olga Millers

## 2016-07-29 ENCOUNTER — Ambulatory Visit (INDEPENDENT_AMBULATORY_CARE_PROVIDER_SITE_OTHER): Payer: Medicare Other | Admitting: Cardiology

## 2016-07-29 ENCOUNTER — Encounter: Payer: Self-pay | Admitting: Cardiology

## 2016-07-29 VITALS — BP 96/64 | HR 98 | Ht 69.0 in | Wt 208.0 lb

## 2016-07-29 DIAGNOSIS — Z95810 Presence of automatic (implantable) cardiac defibrillator: Secondary | ICD-10-CM | POA: Diagnosis not present

## 2016-07-29 DIAGNOSIS — I2510 Atherosclerotic heart disease of native coronary artery without angina pectoris: Secondary | ICD-10-CM | POA: Diagnosis not present

## 2016-07-29 DIAGNOSIS — I255 Ischemic cardiomyopathy: Secondary | ICD-10-CM

## 2016-07-29 DIAGNOSIS — I509 Heart failure, unspecified: Secondary | ICD-10-CM | POA: Diagnosis not present

## 2016-07-29 LAB — BASIC METABOLIC PANEL
BUN: 25 mg/dL (ref 7–25)
CHLORIDE: 90 mmol/L — AB (ref 98–110)
CO2: 26 mmol/L (ref 20–31)
Calcium: 8.8 mg/dL (ref 8.6–10.3)
Creat: 1.41 mg/dL — ABNORMAL HIGH (ref 0.70–1.18)
Glucose, Bld: 54 mg/dL — ABNORMAL LOW (ref 65–99)
POTASSIUM: 3.8 mmol/L (ref 3.5–5.3)
Sodium: 127 mmol/L — ABNORMAL LOW (ref 135–146)

## 2016-07-29 MED ORDER — CARVEDILOL 3.125 MG PO TABS: 3.1250 mg | ORAL_TABLET | Freq: Two times a day (BID) | ORAL | 3 refills | Status: DC

## 2016-07-29 MED ORDER — LISINOPRIL 2.5 MG PO TABS: 2.5000 mg | ORAL_TABLET | Freq: Every day | ORAL | 3 refills | Status: DC

## 2016-07-29 MED ORDER — FUROSEMIDE 20 MG PO TABS: 40.0000 mg | ORAL_TABLET | Freq: Two times a day (BID) | ORAL | 3 refills | Status: DC

## 2016-07-29 NOTE — Patient Instructions (Signed)
Medication Instructions:   DECREASE CARVEDILOL TO 3.125 MG TWICE DAILY= 1/2 OF THE 6.25 MG TABLET TWICE DAILY  DECREASE LISINOPRIL TO 2.5 MG ONCE DAILY  TAKE 80 MG OF FUROSEMIDE TWICE DAILY FOR THE NEXT 2 DAYS= 4 OF THE 20 MG TABLETS TWICE DAILY X 2 DAYS  AFTER 2 DAYS DECREASE FUROSEMIDE TO 40 MG TWICE DAILY= 2 OF THE 20 MG TABLETS TWICE DAILY   Labwork:  Your physician recommends that you HAVE LAB WORK TODAY  Follow-Up:  Your physician recommends that you schedule a follow-up appointment Thursday NEXT WEEK WITH APP  Your physician recommends that you schedule a follow-up appointment in: 8 WEEKS WITH DR [name]

## 2016-07-30 LAB — BRAIN NATRIURETIC PEPTIDE: Brain Natriuretic Peptide: 639.5 pg/mL — ABNORMAL HIGH (ref <100)

## 2016-08-02 ENCOUNTER — Telehealth: Payer: Self-pay | Admitting: Cardiology

## 2016-08-02 MED ORDER — TORSEMIDE 20 MG PO TABS: 20.0000 mg | ORAL_TABLET | Freq: Two times a day (BID) | ORAL | 12 refills | Status: DC

## 2016-08-02 NOTE — Telephone Encounter (Signed)
Spoke with pt, Aware of dr Ludwig Clarks recommendations. New script sent to the pharmacy for torsemide. He will start with the first dose this evening.

## 2016-08-02 NOTE — Telephone Encounter (Signed)
DC lasix; demadex 20 mg BID; bmet Thursday with paov [name]

## 2016-08-02 NOTE — Telephone Encounter (Signed)
Pt is calling because his Lasix was decreased by Dr.Crenshaw. but it doesntt seem like its working would like to know if he can increase it back

## 2016-08-02 NOTE — Telephone Encounter (Signed)
Spoke with pt states that he has not weighed himself today but he states that he can feel that his abdomen and bilateral extremities are slightly swollen. Pt states that he has metolozone in addition to his lasix if pt needs to take this. Pt has appt Thursday with APP pt is currently taking 40mg  BID. Please advise

## 2016-08-03 ENCOUNTER — Telehealth: Payer: Self-pay | Admitting: Cardiology

## 2016-08-03 NOTE — Telephone Encounter (Signed)
°  New Prob   Pt c/o swelling: STAT is pt has developed SOB within 24 hours  1. How long have you been experiencing swelling? 2-3 days  2. Where is the swelling located? Abdomen  3.  Are you currently taking a "fluid pill"? Yes  4.  Are you currently SOB? Yes started 2-3 days ago  5.  Have you traveled recently? No

## 2016-08-03 NOTE — Telephone Encounter (Signed)
Patient called with MD advice. He voiced understanding. Med list updated.

## 2016-08-03 NOTE — Telephone Encounter (Signed)
Returned call to patient. He states he called yesterday with same issue and talked to [place].   He states torsemide did not work, did not take the fluid out - he has take 2-3 doses. He states he did not sleep well last night. He has trouble breathing.   He states he went from 204lbs to 201lbs prior to starting torsemide.   He states he has not been eating well. He is watching sodium intake.   Will inform MD that patient called in again with same complaints. He states he has metolazone tablets at home (has not taken them) but is willing to, if OK'ed by MD  Routed to Dr. [name] to advise

## 2016-08-03 NOTE — Telephone Encounter (Signed)
Metolazone 2.5 mg po 30 min prior to dose of demadex this PM and then 30 min prior to AM dose daily; fu Thursday with pa as scheduled; may need to be admitted if no improvement. Olga Millers

## 2016-08-05 ENCOUNTER — Ambulatory Visit (INDEPENDENT_AMBULATORY_CARE_PROVIDER_SITE_OTHER): Payer: Medicare Other | Admitting: Cardiology

## 2016-08-05 ENCOUNTER — Encounter: Payer: Self-pay | Admitting: Cardiology

## 2016-08-05 VITALS — BP 91/60 | HR 68 | Ht 70.0 in | Wt 211.8 lb

## 2016-08-05 DIAGNOSIS — M069 Rheumatoid arthritis, unspecified: Secondary | ICD-10-CM

## 2016-08-05 DIAGNOSIS — N183 Chronic kidney disease, stage 3 unspecified: Secondary | ICD-10-CM | POA: Insufficient documentation

## 2016-08-05 DIAGNOSIS — I5043 Acute on chronic combined systolic (congestive) and diastolic (congestive) heart failure: Secondary | ICD-10-CM

## 2016-08-05 DIAGNOSIS — E119 Type 2 diabetes mellitus without complications: Secondary | ICD-10-CM

## 2016-08-05 DIAGNOSIS — N171 Acute kidney failure with acute cortical necrosis: Secondary | ICD-10-CM | POA: Insufficient documentation

## 2016-08-05 DIAGNOSIS — IMO0001 Reserved for inherently not codable concepts without codable children: Secondary | ICD-10-CM

## 2016-08-05 DIAGNOSIS — I255 Ischemic cardiomyopathy: Secondary | ICD-10-CM

## 2016-08-05 DIAGNOSIS — Z95810 Presence of automatic (implantable) cardiac defibrillator: Secondary | ICD-10-CM

## 2016-08-05 DIAGNOSIS — Z794 Long term (current) use of insulin: Secondary | ICD-10-CM

## 2016-08-05 DIAGNOSIS — Z951 Presence of aortocoronary bypass graft: Secondary | ICD-10-CM

## 2016-08-05 DIAGNOSIS — Z79899 Other long term (current) drug therapy: Secondary | ICD-10-CM | POA: Diagnosis not present

## 2016-08-05 NOTE — Assessment & Plan Note (Signed)
SCr 1.4, unable to take NSAIDs

## 2016-08-05 NOTE — Assessment & Plan Note (Signed)
Seen by [name] 12/141/7- Zaroxolyn added. Here today for follow up.

## 2016-08-05 NOTE — Assessment & Plan Note (Signed)
On Lantus 

## 2016-08-05 NOTE — Assessment & Plan Note (Signed)
DOI 2008, Changed 2015. Dr Graciela Husbands follows

## 2016-08-05 NOTE — Patient Instructions (Signed)
Medication Instructions:  BEGIN TAKING METOLAZONE 2.5MG  ONCE DAILY ON MONDAYS, WEDNESDAYS, AND FRIDAYS 30 MINUTES PRIOR TO TORSEMIDE.    Labwork: HAVE BLOOD WORK DRAWN TODAY (BMP).  Testing/Procedures: NONE  Follow-Up: FOLLOW UP WITH DR. CRENSHAW OR LUKE KILROY PA IN 2 WEEKS.    If you need a refill on your cardiac medications before your next appointment, please call your pharmacy.

## 2016-08-05 NOTE — Assessment & Plan Note (Signed)
EF 45-50% by echo 2013

## 2016-08-05 NOTE — Assessment & Plan Note (Signed)
Uses a scooter to get around. Home wgts have been challenging

## 2016-08-05 NOTE — Progress Notes (Signed)
08/05/2016 [name]   01/22/1935  [id number]  Primary Physician Lucila Maine, MD Primary Cardiologist: Dr [name]  HPI:   81 y/o male, not mobile secondary to arthritis, with a history of CAD, status post coronary artery bypassing graft in 2000 while in [place],  ischemic cardiomyopathy, hypertension, diabetes, CRI-3, and hyperlipidemia. His most recent Myoview was performed in Jan 2012. At that time, his ejection fraction was 31%. There was prior apical, inferoseptal and inferior infarction, but there was no ischemia. He also has had a previous defibrillator placed in 2008, gen change 2015.Marland Kitchen Echocardiogram in November of 2013 showed an ejection fraction of 45-50%. There was grade 1 diastolic dysfunction. There was moderate left atrial and mild right ventricular/right atrial enlargement. He recently saw Dr [name] and was felt to be in CHF. Zaroxolyn was added and he is in the office today for follow up. He says he feels better. His wgt is actually up from LOV but home wgts have been difficult secondary to his arthritis.    Current Outpatient Prescriptions  Medication Sig Dispense Refill  . ACCU-CHEK AVIVA PLUS test strip CHECK BLOOD SUGAR TWICE A DAY  1  . aspirin EC 81 MG tablet Take 81 mg by mouth daily.    Marland Kitchen atorvastatin (LIPITOR) 10 MG tablet Take 10 mg by mouth daily.      . carvedilol (COREG) 3.125 MG tablet Take 1 tablet (3.125 mg total) by mouth 2 (two) times daily with a meal. 180 tablet 3  . esomeprazole (NEXIUM) 40 MG capsule Take 40 mg by mouth daily.      Marland Kitchen glyBURIDE-metformin (GLUCOVANCE) 5-500 MG per tablet Take 1 tablet by mouth daily.      . insulin glargine (LANTUS) 100 UNIT/ML injection Inject 10-40 Units into the skin daily. Sliding scale.  If cbg= 110: none, cbg=200 take 25-30 units, cbg over 250 take 40 units    . lisinopril (PRINIVIL,ZESTRIL) 2.5 MG tablet Take 1 tablet (2.5 mg total) by mouth daily. 90 tablet 3  . metolazone (ZAROXOLYN) 2.5 MG tablet Take 2.5 mg  by mouth 3 (three) times a week. Take 30 minutes prior to dose of torsemide on Monday, Wednesday, and Fridays.     . Omega-3 Fatty Acids (FISH OIL PO) Take 1 capsule by mouth daily. Reported on 03/04/2016    . spironolactone (ALDACTONE) 25 MG tablet TAKE ONE-HALF (1/2) TABLET DAILY 45 tablet 10  . torsemide (DEMADEX) 20 MG tablet Take 1 tablet (20 mg total) by mouth 2 (two) times daily. 60 tablet 12   No current facility-administered medications for this visit.     No Known Allergies  Social History   Social History  . Marital status: Married    Spouse name: N/A  . Number of children: N/A  . Years of education: N/A   Occupational History  . Not on file.   Social History Main Topics  . Smoking status: Never Smoker  . Smokeless tobacco: Never Used  . Alcohol use No  . Drug use: No  . Sexual activity: Not on file   Other Topics Concern  . Not on file   Social History Narrative   Married. Has 1 surviving child and 4 surviving grandchildren.      Review of Systems: General: negative for chills, fever, night sweats or weight changes.  Cardiovascular: negative for chest pain, dyspnea on exertion, edema, orthopnea, palpitations, paroxysmal nocturnal dyspnea or shortness of breath Dermatological: negative for rash Respiratory: negative for cough or wheezing Urologic:  negative for hematuria Abdominal: negative for nausea, vomiting, diarrhea, bright red blood per rectum, melena, or hematemesis Neurologic: negative for visual changes, syncope, or dizziness All other systems reviewed and are otherwise negative except as noted above.    Blood pressure 91/60, pulse 68, height 5\' 10"  (1.778 m), weight 211 lb 12.8 oz (96.1 kg), SpO2 100 %.  General appearance: alert, cooperative and no distress Lungs: clear to auscultation bilaterally Heart: regular rate and rhythm Extremities: no edema Neurologic: Grossly normal   ASSESSMENT AND PLAN:   Hx of CABG S/P CABG x 4 in [facility]  2000   Cardiomyopathy, ischemic EF 45-50% by echo 2013  Insulin dependent diabetes mellitus (HCC) On Lantus  Single implantable cardioverter-defibrillator BSX  DOI 2008, Changed 2015. Dr 2009 follows  Rheumatoid arthritis [facility]) Uses a scooter to get around. Home wgts have been challenging   Acute on chronic combined systolic and diastolic congestive heart failure (HCC) Seen by Crenshaw 12/141/7- Zaroxolyn added. Here today for follow up.   Chronic renal failure in pediatric patient, stage 3 (moderate) SCr 1.4, unable to take NSAIDs   PLAN  Zaroxolyn 2.5 mg MWF. Check BMP today. HHRN to see. F/U two weeks.   10-28-2001 PA-C 08/05/2016 3:08 PM

## 2016-08-05 NOTE — Assessment & Plan Note (Signed)
S/P CABG x 4 in [facility] 2000

## 2016-08-06 LAB — BASIC METABOLIC PANEL
BUN: 54 mg/dL — ABNORMAL HIGH (ref 7–25)
CO2: 26 mmol/L (ref 20–31)
Calcium: 9 mg/dL (ref 8.6–10.3)
Chloride: 84 mmol/L — ABNORMAL LOW (ref 98–110)
Creat: 2.33 mg/dL — ABNORMAL HIGH (ref 0.70–1.18)
Glucose, Bld: 64 mg/dL — ABNORMAL LOW (ref 65–99)
Potassium: 3.6 mmol/L (ref 3.5–5.3)
Sodium: 127 mmol/L — ABNORMAL LOW (ref 135–146)

## 2016-08-11 ENCOUNTER — Telehealth: Payer: Self-pay | Admitting: *Deleted

## 2016-08-11 DIAGNOSIS — Z79899 Other long term (current) drug therapy: Secondary | ICD-10-CM

## 2016-08-11 MED ORDER — TORSEMIDE 20 MG PO TABS: 20.0000 mg | ORAL_TABLET | Freq: Every day | ORAL | 11 refills | Status: DC

## 2016-08-11 NOTE — Telephone Encounter (Signed)
Notes Recorded by Abelino Derrick, PA-C on 08/06/2016 at 3:13 PM EST I reviewed renal function- his BUN/SCr are up indicating he may be over diuresed. Stop Zaroxolyn, decrease Demadex to 20 mg daily. BMP one week.  Corine Shelter PA-C 08/06/2016 3:13 PM  Spoke to patient regarding recommendations. BMET orders placed, medication change instructions acknowledged. Pt to present for recheck on labwork in 1 week and for f/u appt. Aware to call in interim if new concerns.

## 2016-08-19 ENCOUNTER — Ambulatory Visit: Payer: Medicare Other | Admitting: Cardiology

## 2016-08-23 ENCOUNTER — Encounter (HOSPITAL_COMMUNITY): Payer: Self-pay

## 2016-08-23 ENCOUNTER — Emergency Department (HOSPITAL_COMMUNITY): Payer: Medicare Other

## 2016-08-23 ENCOUNTER — Inpatient Hospital Stay (HOSPITAL_COMMUNITY)
Admission: EM | Admit: 2016-08-23 | Discharge: 2016-09-02 | DRG: 917 | Disposition: A | Discharge status: Skilled Nursing Facility | Payer: Medicare Other | Attending: Cardiovascular Disease | Admitting: Cardiovascular Disease

## 2016-08-23 DIAGNOSIS — Z79899 Other long term (current) drug therapy: Secondary | ICD-10-CM

## 2016-08-23 DIAGNOSIS — R339 Retention of urine, unspecified: Secondary | ICD-10-CM | POA: Diagnosis not present

## 2016-08-23 DIAGNOSIS — R748 Abnormal levels of other serum enzymes: Secondary | ICD-10-CM | POA: Diagnosis not present

## 2016-08-23 DIAGNOSIS — R570 Cardiogenic shock: Secondary | ICD-10-CM | POA: Diagnosis not present

## 2016-08-23 DIAGNOSIS — R7401 Elevation of levels of liver transaminase levels: Secondary | ICD-10-CM

## 2016-08-23 DIAGNOSIS — N171 Acute kidney failure with acute cortical necrosis: Secondary | ICD-10-CM | POA: Diagnosis present

## 2016-08-23 DIAGNOSIS — N183 Chronic kidney disease, stage 3 (moderate): Secondary | ICD-10-CM | POA: Diagnosis present

## 2016-08-23 DIAGNOSIS — R5381 Other malaise: Secondary | ICD-10-CM | POA: Diagnosis present

## 2016-08-23 DIAGNOSIS — Z8711 Personal history of peptic ulcer disease: Secondary | ICD-10-CM

## 2016-08-23 DIAGNOSIS — I472 Ventricular tachycardia, unspecified: Secondary | ICD-10-CM

## 2016-08-23 DIAGNOSIS — I248 Other forms of acute ischemic heart disease: Secondary | ICD-10-CM | POA: Diagnosis present

## 2016-08-23 DIAGNOSIS — Z452 Encounter for adjustment and management of vascular access device: Secondary | ICD-10-CM

## 2016-08-23 DIAGNOSIS — Z23 Encounter for immunization: Secondary | ICD-10-CM

## 2016-08-23 DIAGNOSIS — F419 Anxiety disorder, unspecified: Secondary | ICD-10-CM | POA: Diagnosis present

## 2016-08-23 DIAGNOSIS — D696 Thrombocytopenia, unspecified: Secondary | ICD-10-CM | POA: Diagnosis not present

## 2016-08-23 DIAGNOSIS — I130 Hypertensive heart and chronic kidney disease with heart failure and stage 1 through stage 4 chronic kidney disease, or unspecified chronic kidney disease: Secondary | ICD-10-CM | POA: Diagnosis present

## 2016-08-23 DIAGNOSIS — I959 Hypotension, unspecified: Secondary | ICD-10-CM | POA: Diagnosis not present

## 2016-08-23 DIAGNOSIS — I255 Ischemic cardiomyopathy: Secondary | ICD-10-CM | POA: Diagnosis present

## 2016-08-23 DIAGNOSIS — I4891 Unspecified atrial fibrillation: Secondary | ICD-10-CM | POA: Diagnosis not present

## 2016-08-23 DIAGNOSIS — I5043 Acute on chronic combined systolic (congestive) and diastolic (congestive) heart failure: Secondary | ICD-10-CM | POA: Diagnosis present

## 2016-08-23 DIAGNOSIS — I2510 Atherosclerotic heart disease of native coronary artery without angina pectoris: Secondary | ICD-10-CM | POA: Diagnosis present

## 2016-08-23 DIAGNOSIS — K7200 Acute and subacute hepatic failure without coma: Secondary | ICD-10-CM | POA: Diagnosis not present

## 2016-08-23 DIAGNOSIS — E871 Hypo-osmolality and hyponatremia: Secondary | ICD-10-CM | POA: Diagnosis present

## 2016-08-23 DIAGNOSIS — Z951 Presence of aortocoronary bypass graft: Secondary | ICD-10-CM

## 2016-08-23 DIAGNOSIS — E861 Hypovolemia: Secondary | ICD-10-CM | POA: Diagnosis present

## 2016-08-23 DIAGNOSIS — T501X1A Poisoning by loop [high-ceiling] diuretics, accidental (unintentional), initial encounter: Secondary | ICD-10-CM | POA: Diagnosis not present

## 2016-08-23 DIAGNOSIS — I5023 Acute on chronic systolic (congestive) heart failure: Secondary | ICD-10-CM | POA: Diagnosis present

## 2016-08-23 DIAGNOSIS — M1990 Unspecified osteoarthritis, unspecified site: Secondary | ICD-10-CM | POA: Diagnosis present

## 2016-08-23 DIAGNOSIS — I952 Hypotension due to drugs: Secondary | ICD-10-CM | POA: Diagnosis not present

## 2016-08-23 DIAGNOSIS — I454 Nonspecific intraventricular block: Secondary | ICD-10-CM | POA: Diagnosis present

## 2016-08-23 DIAGNOSIS — Z7982 Long term (current) use of aspirin: Secondary | ICD-10-CM

## 2016-08-23 DIAGNOSIS — N170 Acute kidney failure with tubular necrosis: Secondary | ICD-10-CM | POA: Diagnosis not present

## 2016-08-23 DIAGNOSIS — R112 Nausea with vomiting, unspecified: Secondary | ICD-10-CM

## 2016-08-23 DIAGNOSIS — N179 Acute kidney failure, unspecified: Secondary | ICD-10-CM | POA: Diagnosis not present

## 2016-08-23 DIAGNOSIS — D649 Anemia, unspecified: Secondary | ICD-10-CM | POA: Diagnosis present

## 2016-08-23 DIAGNOSIS — Z95810 Presence of automatic (implantable) cardiac defibrillator: Secondary | ICD-10-CM

## 2016-08-23 DIAGNOSIS — R740 Nonspecific elevation of levels of transaminase and lactic acid dehydrogenase [LDH]: Secondary | ICD-10-CM

## 2016-08-23 DIAGNOSIS — E1122 Type 2 diabetes mellitus with diabetic chronic kidney disease: Secondary | ICD-10-CM | POA: Diagnosis present

## 2016-08-23 DIAGNOSIS — R319 Hematuria, unspecified: Secondary | ICD-10-CM | POA: Diagnosis not present

## 2016-08-23 DIAGNOSIS — E785 Hyperlipidemia, unspecified: Secondary | ICD-10-CM | POA: Diagnosis present

## 2016-08-23 DIAGNOSIS — Z794 Long term (current) use of insulin: Secondary | ICD-10-CM

## 2016-08-23 DIAGNOSIS — I2582 Chronic total occlusion of coronary artery: Secondary | ICD-10-CM | POA: Diagnosis present

## 2016-08-23 DIAGNOSIS — E119 Type 2 diabetes mellitus without complications: Secondary | ICD-10-CM

## 2016-08-23 DIAGNOSIS — M069 Rheumatoid arthritis, unspecified: Secondary | ICD-10-CM | POA: Diagnosis present

## 2016-08-23 LAB — URINALYSIS, ROUTINE W REFLEX MICROSCOPIC
BILIRUBIN URINE: NEGATIVE
GLUCOSE, UA: NEGATIVE mg/dL
HGB URINE DIPSTICK: NEGATIVE
KETONES UR: NEGATIVE mg/dL
Leukocytes, UA: NEGATIVE
Nitrite: NEGATIVE
PH: 7 (ref 5.0–8.0)
Protein, ur: NEGATIVE mg/dL
Specific Gravity, Urine: 1.011 (ref 1.005–1.030)

## 2016-08-23 LAB — BASIC METABOLIC PANEL
Anion gap: 12 (ref 5–15)
BUN: 36 mg/dL — AB (ref 6–20)
CHLORIDE: 80 mmol/L — AB (ref 101–111)
CO2: 29 mmol/L (ref 22–32)
Calcium: 9.2 mg/dL (ref 8.9–10.3)
Creatinine, Ser: 1.56 mg/dL — ABNORMAL HIGH (ref 0.61–1.24)
GFR calc Af Amer: 47 mL/min — ABNORMAL LOW (ref >60)
GFR, EST NON AFRICAN AMERICAN: 41 mL/min — AB (ref >60)
GLUCOSE: 132 mg/dL — AB (ref 65–99)
POTASSIUM: 3.5 mmol/L (ref 3.5–5.1)
Sodium: 121 mmol/L — ABNORMAL LOW (ref 135–145)

## 2016-08-23 LAB — CBC
HEMATOCRIT: 32.9 % — AB (ref 39.0–52.0)
HEMATOCRIT: 31.7 % — AB (ref 39.0–52.0)
HEMOGLOBIN: 11.3 g/dL — AB (ref 13.0–17.0)
Hemoglobin: 11.8 g/dL — ABNORMAL LOW (ref 13.0–17.0)
MCH: 28.2 pg (ref 26.0–34.0)
MCH: 28.1 pg (ref 26.0–34.0)
MCHC: 35.9 g/dL (ref 30.0–36.0)
MCHC: 35.6 g/dL (ref 30.0–36.0)
MCV: 78.5 fL (ref 78.0–100.0)
MCV: 78.9 fL (ref 78.0–100.0)
Platelets: 168 10*3/uL (ref 150–400)
Platelets: 160 10*3/uL (ref 150–400)
RBC: 4.19 MIL/uL — ABNORMAL LOW (ref 4.22–5.81)
RBC: 4.02 MIL/uL — ABNORMAL LOW (ref 4.22–5.81)
RDW: 13.3 % (ref 11.5–15.5)
RDW: 13.3 % (ref 11.5–15.5)
WBC: 9.1 10*3/uL (ref 4.0–10.5)
WBC: 7.6 10*3/uL (ref 4.0–10.5)

## 2016-08-23 LAB — GLUCOSE, CAPILLARY: GLUCOSE-CAPILLARY: 172 mg/dL — AB (ref 65–99)

## 2016-08-23 LAB — CBG MONITORING, ED: Glucose-Capillary: 122 mg/dL — ABNORMAL HIGH (ref 65–99)

## 2016-08-23 LAB — TROPONIN I: Troponin I: 0.46 ng/mL (ref <0.03)

## 2016-08-23 LAB — CREATININE, SERUM
CREATININE: 1.43 mg/dL — AB (ref 0.61–1.24)
GFR calc Af Amer: 52 mL/min — ABNORMAL LOW (ref >60)
GFR calc non Af Amer: 45 mL/min — ABNORMAL LOW (ref >60)

## 2016-08-23 LAB — I-STAT TROPONIN, ED: Troponin i, poc: 0.15 ng/mL (ref 0.00–0.08)

## 2016-08-23 LAB — BRAIN NATRIURETIC PEPTIDE
B NATRIURETIC PEPTIDE 5: 879.5 pg/mL — AB (ref 0.0–100.0)
B Natriuretic Peptide: 961.2 pg/mL — ABNORMAL HIGH (ref 0.0–100.0)

## 2016-08-23 MED ORDER — GLYBURIDE-METFORMIN 5-500 MG PO TABS: 1.0000 | ORAL_TABLET | Freq: Every day | ORAL | Status: DC

## 2016-08-23 MED ORDER — INSULIN ASPART 100 UNIT/ML ~~LOC~~ SOLN
0.0000 [IU] | Freq: Three times a day (TID) | SUBCUTANEOUS | Status: DC
  Administered 2016-08-24 (×2): 8 [IU] via SUBCUTANEOUS
  Administered 2016-08-25: 11 [IU] via SUBCUTANEOUS
  Administered 2016-08-25: 3 [IU] via SUBCUTANEOUS
  Administered 2016-08-26 – 2016-08-27 (×3): 5 [IU] via SUBCUTANEOUS
  Administered 2016-08-27: 3 [IU] via SUBCUTANEOUS
  Administered 2016-08-27: 5 [IU] via SUBCUTANEOUS
  Administered 2016-08-28 (×2): 3 [IU] via SUBCUTANEOUS
  Administered 2016-08-28: 5 [IU] via SUBCUTANEOUS
  Administered 2016-08-29 (×2): 3 [IU] via SUBCUTANEOUS
  Administered 2016-08-29 – 2016-08-30 (×2): 5 [IU] via SUBCUTANEOUS
  Administered 2016-08-30 – 2016-08-31 (×3): 3 [IU] via SUBCUTANEOUS
  Administered 2016-08-31: 5 [IU] via SUBCUTANEOUS
  Administered 2016-08-31: 3 [IU] via SUBCUTANEOUS
  Administered 2016-09-01: 5 [IU] via SUBCUTANEOUS
  Administered 2016-09-01: 3 [IU] via SUBCUTANEOUS
  Administered 2016-09-01: 5 [IU] via SUBCUTANEOUS
  Administered 2016-09-02: 8 [IU] via SUBCUTANEOUS
  Administered 2016-09-02: 11 [IU] via SUBCUTANEOUS

## 2016-08-23 MED ORDER — POTASSIUM CHLORIDE CRYS ER 20 MEQ PO TBCR
40.0000 meq | EXTENDED_RELEASE_TABLET | Freq: Once | ORAL | Status: AC
  Administered 2016-08-23: 40 meq via ORAL
  Filled 2016-08-23: qty 2

## 2016-08-23 MED ORDER — ONDANSETRON HCL 4 MG/2ML IJ SOLN
4.0000 mg | Freq: Once | INTRAMUSCULAR | Status: AC
  Administered 2016-08-23: 4 mg via INTRAVENOUS
  Filled 2016-08-23: qty 2

## 2016-08-23 MED ORDER — PANTOPRAZOLE SODIUM 40 MG PO TBEC
80.0000 mg | DELAYED_RELEASE_TABLET | Freq: Every day | ORAL | Status: DC
  Administered 2016-08-24 – 2016-09-02 (×10): 80 mg via ORAL
  Filled 2016-08-23 (×12): qty 2

## 2016-08-23 MED ORDER — SODIUM CHLORIDE 0.9 % IV SOLN
INTRAVENOUS | Status: AC
  Administered 2016-08-23: 20:00:00 via INTRAVENOUS

## 2016-08-23 MED ORDER — SODIUM CHLORIDE 0.9 % IV BOLUS (SEPSIS)
1000.0000 mL | Freq: Once | INTRAVENOUS | Status: AC
  Administered 2016-08-23: 1000 mL via INTRAVENOUS

## 2016-08-23 MED ORDER — HEPARIN SODIUM (PORCINE) 5000 UNIT/ML IJ SOLN: 5000.0000 [IU] | Freq: Three times a day (TID) | INTRAMUSCULAR | Status: DC

## 2016-08-23 MED ORDER — ATORVASTATIN CALCIUM 10 MG PO TABS
10.0000 mg | ORAL_TABLET | Freq: Every day | ORAL | Status: DC
  Administered 2016-08-24 – 2016-08-25 (×2): 10 mg via ORAL
  Filled 2016-08-23 (×2): qty 1

## 2016-08-23 MED ORDER — ASPIRIN EC 81 MG PO TBEC
81.0000 mg | DELAYED_RELEASE_TABLET | Freq: Every day | ORAL | Status: DC
  Administered 2016-08-24 – 2016-09-01 (×9): 81 mg via ORAL
  Filled 2016-08-23 (×9): qty 1

## 2016-08-23 NOTE — Progress Notes (Signed)
Patient has arrived to floor, Hypotensive and tachycardic, on call Cardiologist paged to let them know. Will continue to monitor.

## 2016-08-23 NOTE — ED Notes (Addendum)
Patient undressed, in gown, on monitor, zoll pads, continuous pulse oximetry and blood pressure cuff; visitors at bedside

## 2016-08-23 NOTE — Progress Notes (Signed)
    Paged regarding elevated trop 0.1>>0.4. Rates have been elevated in the 140s, likely demand ischemiaPt denies any chest pain, or dyspnea. Discussed with Dr. Duke Salvia who reviewed telemetry and PPM interrogation. Not felt to have AF or VT at this time. Was given potassium for freq PACs, and PVCs. Also given 500cc SL bolus. Will add on mag level.   Laverda Page, NP-C

## 2016-08-23 NOTE — Progress Notes (Signed)
Pt unable to void after several attempts. Bladder distended and pt uncomfortable Bladder Scan >800 cc in bladder. Provider on call paged w/orders to In and Out cath. Will continue to monitor. Dierdre Highman, RN

## 2016-08-23 NOTE — ED Notes (Signed)
POCT CBG resulted 122; Alphonzo Lemmings, RN aware

## 2016-08-23 NOTE — ED Provider Notes (Signed)
Medical screening examination/treatment/procedure(s) were conducted as a shared visit with non-physician practitioner(s) and myself.  I personally evaluated the patient during the encounter.  82 year old male is here from a PCP office secondary to hypotension and tachycardia. On my evaluation the patient's appears overall well but is still in A. fib with RVR and also has blood pressures of 80s over 50s. States he still has a little dizziness when he moves. Review his records it seems he has paroxysmal atrial fibrillation but not persistent atrial fibrillation so this seems to be new for him. I considered starting him on diltiazem however secondary to his low blood pressure decided to give him some fluids first and consult cardiology. Cardiology evaluated the patient and will admit for overnight observation and will workup the new atrial fibrillation as well.   EKG Interpretation  Date/Time:  Monday August 23 2016 14:16:15 EST Ventricular Rate:  135 PR Interval:    QRS Duration: 128 QT Interval:  364 QTC Calculation: 546 R Axis:   -156 Text Interpretation:   Critical Test Result: Arrhythmia Undetermined rhythm Right superior axis deviation Non-specific intra-ventricular conduction block Cannot rule out Anterior infarct , age undetermined Abnormal ECG Confirmed by [facility] MD, Ngoc Detjen [phone]) on 08/23/2016 3:15:48 PM         Marily Memos, MD 08/23/16 2145

## 2016-08-23 NOTE — ED Provider Notes (Signed)
[facility]-EMERGENCY DEPT Provider Note   CSN: [id number] Arrival date & time: 08/23/16  1256     History   Chief Complaint Chief Complaint  Patient presents with  . Hypotension    HPI [name] is a 82 y.o. male.  Pt is a 82 y/o M with PMH of CAD s/p CABG in 2000, ischemic cardiomyopathy, CHF (last echo Nov 2013 EF 45-50%), Dm, htn, hld, PUD, RA, and ICD placement in 2008, who presents to ED from PCP office for hypotension and tachycardia. Initially went to PCP office for evaluation of dizziness x approx 1 week, states he was recently switched from Furosemide to Torsemide on 08/05/16, but reports he has accidentally been taking both. Reports dizziness with standing, no syncope, no recent fall or trauma. Cardiologist is Dr. [name]. No anticoag use. Denies fevers, chills, Cp, SOB, cough, abd pain, n/v/d, dysuria, or any additional concerns.   The history is provided by the patient. No language interpreter was used.    Past Medical History:  Diagnosis Date  . Congestive heart failure, unspecified   . Coronary atherosclerosis of unspecified type of vessel, native or graft   . ICD (implantable cardiac defibrillator), single BSX    DOI 2008  . Ischemic cardiomyopathy   . Other and unspecified hyperlipidemia   . Peptic ulcer, unspecified site, unspecified as acute or chronic, without mention of hemorrhage, perforation, or obstruction   . Rheumatoid arthritis(714.0)   . Type II or unspecified type diabetes mellitus without mention of complication, not stated as uncontrolled   . Unspecified essential hypertension   . Ventricular tachycardia (HCC)    Rx via ICD 5 /2012    Patient Active Problem List   Diagnosis Date Noted  . Acute on chronic combined systolic and diastolic congestive heart failure (HCC) 08/05/2016  . Chronic renal failure in pediatric patient, stage 3 (moderate) 08/05/2016  . Single implantable cardioverter-defibrillator BSX    . Ventricular tachycardia-treated  the ATP 01/29/2011  . Hx of CABG 04/17/2009  . Cardiomyopathy, ischemic 04/17/2009  . Chronic systolic heart failure (HCC) 04/17/2009  . Insulin dependent diabetes mellitus (HCC) 04/12/2009  . HYPERLIPIDEMIA 04/12/2009  . HYPERTENSION 04/12/2009  . PUD 04/12/2009  . Rheumatoid arthritis (HCC) 04/12/2009    Past Surgical History:  Procedure Laterality Date  . CARDIAC DEFIBRILLATOR PLACEMENT     status post implantable  . coronary artey bypass graft     status post  . decomppression (other)     left median nerve  . IMPLANTABLE CARDIOVERTER DEFIBRILLATOR (ICD) GENERATOR CHANGE N/A 01/25/2014   Procedure: ICD GENERATOR CHANGE;  Surgeon: Duke Salvia, MD;  Location: [facility] CATH LAB;  Service: Cardiovascular;  Laterality: N/A;       Home Medications    Prior to Admission medications   Medication Sig Start Date End Date Taking? Authorizing Provider  ACCU-CHEK AVIVA PLUS test strip CHECK BLOOD SUGAR TWICE A DAY 02/10/16   Historical Provider, MD  aspirin EC 81 MG tablet Take 81 mg by mouth daily.    Historical Provider, MD  atorvastatin (LIPITOR) 10 MG tablet Take 10 mg by mouth daily.      Historical Provider, MD  carvedilol (COREG) 3.125 MG tablet Take 1 tablet (3.125 mg total) by mouth 2 (two) times daily with a meal. 07/29/16   Lewayne Bunting, MD  esomeprazole (NEXIUM) 40 MG capsule Take 40 mg by mouth daily.      Historical Provider, MD  glyBURIDE-metformin (GLUCOVANCE) 5-500 MG per tablet Take 1 tablet  by mouth daily.      Historical Provider, MD  insulin glargine (LANTUS) 100 UNIT/ML injection Inject 10-40 Units into the skin daily. Sliding scale.  If cbg= 110: none, cbg=200 take 25-30 units, cbg over 250 take 40 units    Historical Provider, MD  lisinopril (PRINIVIL,ZESTRIL) 2.5 MG tablet Take 1 tablet (2.5 mg total) by mouth daily. 07/29/16   Lewayne Bunting, MD  Omega-3 Fatty Acids (FISH OIL PO) Take 1 capsule by mouth daily. Reported on 03/04/2016    Historical Provider, MD    spironolactone (ALDACTONE) 25 MG tablet TAKE ONE-HALF (1/2) TABLET DAILY 05/03/16   Duke Salvia, MD  torsemide (DEMADEX) 20 MG tablet Take 1 tablet (20 mg total) by mouth daily. 08/11/16 11/09/16  Abelino Derrick, PA-C    Family History Family History  Problem Relation Age of Onset  . Coronary artery disease Sister   . Coronary artery disease Brother     Social History Social History  Substance Use Topics  . Smoking status: Never Smoker  . Smokeless tobacco: Never Used  . Alcohol use No     Allergies   Patient has no known allergies.   Review of Systems Review of Systems  Constitutional: Negative for chills, fever and unexpected weight change.  Eyes: Negative for pain and visual disturbance.  Respiratory: Negative for cough, shortness of breath and wheezing.   Cardiovascular: Negative for chest pain and palpitations.  Gastrointestinal: Negative for abdominal pain, diarrhea, nausea and vomiting.  Genitourinary: Negative for dysuria.  Musculoskeletal: Negative for back pain, neck pain and neck stiffness.  Skin: Negative for rash.  Neurological: Positive for dizziness. Negative for weakness, numbness and headaches.  All other systems reviewed and are negative.    Physical Exam Updated Vital Signs BP 95/70 (BP Location: Right Arm)   Pulse (!) 127   Temp 97.9 F (36.6 C) (Oral)   Resp 15   Ht 5\' 10"  (1.778 m)   Wt 95.7 kg   SpO2 100%   BMI 30.28 kg/m   Physical Exam  Constitutional: He is oriented to person, place, and time. He appears well-developed and well-nourished.  HENT:  Head: Normocephalic and atraumatic.  Right Ear: External ear normal.  Left Ear: External ear normal.  Nose: Nose normal.  Mouth/Throat: Oropharynx is clear and moist.  Eyes: Conjunctivae and EOM are normal. Pupils are equal, round, and reactive to light.  Neck: Normal range of motion. Neck supple. No JVD present.  Cardiovascular: Normal heart sounds and intact distal pulses.   Tachycardia present.   Pulmonary/Chest: Effort normal and breath sounds normal. He has no wheezes. He has no rales.  Abdominal: Soft. Bowel sounds are normal. There is no tenderness. There is no rebound and no guarding.  Musculoskeletal: Normal range of motion.  Neurological: He is alert and oriented to person, place, and time.  Skin: Skin is warm and dry.  Nursing note and vitals reviewed.    ED Treatments / Results  Labs (all labs ordered are listed, but only abnormal results are displayed) Labs Reviewed  CBC - Abnormal; Notable for the following:       Result Value   RBC 4.19 (*)    Hemoglobin 11.8 (*)    HCT 32.9 (*)    All other components within normal limits  CBG MONITORING, ED - Abnormal; Notable for the following:    Glucose-Capillary 122 (*)    All other components within normal limits  I-STAT TROPOININ, ED - Abnormal; Notable for the  following:    Troponin i, poc 0.15 (*)    All other components within normal limits  BASIC METABOLIC PANEL  URINALYSIS, ROUTINE W REFLEX MICROSCOPIC    EKG  EKG Interpretation None       Radiology No results found.  Procedures Procedures (including critical care time)  Medications Ordered in ED Medications - No data to display   Initial Impression / Assessment and Plan / ED Course  I have reviewed the triage vital signs and the nursing notes.  Pertinent labs & imaging results that were available during my care of the patient were reviewed by me and considered in my medical decision making (see chart for details).  Clinical Course    Pt is a 82 y/o M who presents to ED for evaluation of hypotension and tachycardia secondary to hypovolemia. Will get labs, CXR, interrogate ICD. Plan to give IVF and discuss with cardiology regarding further tx. Plan to admit.   Final Clinical Impressions(s) / ED Diagnoses   Final diagnoses:  None    New Prescriptions New Prescriptions   No medications on file     Albesa Seen,  NP 08/23/16 2042    Marily Memos, MD 08/23/16 2145

## 2016-08-23 NOTE — ED Notes (Signed)
Robyn Wojeck notified that troponin resulted at 0.15.

## 2016-08-23 NOTE — ED Notes (Signed)
Charge called to interrogate defibrillator.

## 2016-08-23 NOTE — ED Notes (Signed)
EKG performed in triage by [phone]

## 2016-08-23 NOTE — H&P (Signed)
Patient ID: [name] MRN: [id number], DOB/AGE: 09/03/1934   Admit date: 08/23/2016    Primary Physician: Lucila Maine, MD Primary Cardiologist: Dr. [name]   Pt. Profile:  82 y/o male, not mobile secondary to arthritis, with a history of CAD, status post coronary artery bypassing graft in 2000 while in [place],  ischemic cardiomyopathy, hypertension, diabetes, CRI-3, and hyperlipidemia, presenting with hypotension.   Problem List  Past Medical History:  Diagnosis Date  . Congestive heart failure, unspecified   . Coronary atherosclerosis of unspecified type of vessel, native or graft   . ICD (implantable cardiac defibrillator), single BSX    DOI 2008  . Ischemic cardiomyopathy   . Other and unspecified hyperlipidemia   . Peptic ulcer, unspecified site, unspecified as acute or chronic, without mention of hemorrhage, perforation, or obstruction   . Rheumatoid arthritis(714.0)   . Type II or unspecified type diabetes mellitus without mention of complication, not stated as uncontrolled   . Unspecified essential hypertension   . Ventricular tachycardia (HCC)    Rx via ICD 5 /2012    Past Surgical History:  Procedure Laterality Date  . CARDIAC DEFIBRILLATOR PLACEMENT     status post implantable  . coronary artey bypass graft     status post  . decomppression (other)     left median nerve  . IMPLANTABLE CARDIOVERTER DEFIBRILLATOR (ICD) GENERATOR CHANGE N/A 01/25/2014   Procedure: ICD GENERATOR CHANGE;  Surgeon: Duke Salvia, MD;  Location: [facility] CATH LAB;  Service: Cardiovascular;  Laterality: N/A;     Allergies  No Known Allergies  HPI  82 y/o male, not mobile secondary to arthritis, with a history of CAD, status post coronary artery bypassing graft in 2000 while in [place],  ischemic cardiomyopathy, hypertension, diabetes, CRI-3, and hyperlipidemia. His most recent Myoview was performed in Jan 2012. At that time, his ejection fraction was 31%. There was prior apical,  inferoseptal and inferior infarction, but there was no ischemia. He also has had a previous defibrillator placed in 2008, gen change 2015. Echocardiogram in November of 2013 showed an ejection fraction of 45-50%. There was grade 1 diastolic dysfunction. There was moderate left atrial and mild right ventricular/right atrial enlargement. He recently saw Dr [name] 07/29/16 and was felt to be in acute CHF. Zaroxolyn was added. He was seen back in f/u on 08/05/16 and had noted improvement in symptoms. However f/u BMP showed a significant bump in SCr up to 2.33 (basline of 1.3-1.4). He was advised to stop Zaroxolyn and to decrease Demadex to 20 mg daily with plans for a repeat BMP 1 week later. Pt did not show for repeat BMP as instructed.  He now presents to the [facility] ED with hypotension and tachycardia. He was at his PCP office today and BP was noted to be 80/56 and HR 126 bpm. He was symptomatic with positional changes, noting dizziness with standing. There was also concerns for VT based on EKG. BMP in the ED shows renal insufficiency, but much improved from prior BMP. SCr is now 1.56 and BUN 36. EKG shows Non-specific intra-ventricular conduction block, undetermined rhythm. His device was interrogated in the ED and shows underlying ? atrial fibrillation. No VT.  Normal device function. This was a verbal report (we are awaiting official written report). POC troponin is abnormal at 0.15. BNP pending. UA pending. Hgb is 11.8, which is his baseline.  NA is 121. K is WNL at 3.5. CXR unremarkable.   Of note, the patient reports that  he has been taking both Lasix and torsemide by mistake. He did discontinue Zaroxolyn. He is also on Coreg, Lisinopril and spironolactone. He is a bit dizzy if he stands too quickly but denies syncope/ near syncope. No chest pain. His breathing has improved but he still has mild LEE. He denies HA or AMS.    Home Medications  Prior to Admission medications   Medication Sig Start Date End  Date Taking? Authorizing Provider  ACCU-CHEK AVIVA PLUS test strip CHECK BLOOD SUGAR TWICE A DAY 02/10/16  Yes Historical Provider, MD  aspirin EC 81 MG tablet Take 81 mg by mouth daily.   Yes Historical Provider, MD  atorvastatin (LIPITOR) 10 MG tablet Take 10 mg by mouth daily.     Yes Historical Provider, MD  carvedilol (COREG) 3.125 MG tablet Take 1 tablet (3.125 mg total) by mouth 2 (two) times daily with a meal. 07/29/16  Yes Lewayne Bunting, MD  esomeprazole (NEXIUM) 40 MG capsule Take 40 mg by mouth daily.     Yes Historical Provider, MD  glyBURIDE-metformin (GLUCOVANCE) 5-500 MG per tablet Take 1 tablet by mouth daily.     Yes Historical Provider, MD  insulin glargine (LANTUS) 100 UNIT/ML injection Inject 10-40 Units into the skin at bedtime as needed. Sliding scale.  If cbg= 110: none, cbg=200 take 25-30 units, cbg over 250 take 40 units   Yes Historical Provider, MD  lisinopril (PRINIVIL,ZESTRIL) 2.5 MG tablet Take 1 tablet (2.5 mg total) by mouth daily. 07/29/16  Yes Lewayne Bunting, MD  torsemide (DEMADEX) 20 MG tablet Take 1 tablet (20 mg total) by mouth daily. 08/11/16 11/09/16 Yes Luke K Kilroy, PA-C  furosemide (LASIX) 20 MG tablet Take 40 mg by mouth 2 (two) times daily. 07/29/16   Historical Provider, MD  pantoprazole (PROTONIX) 40 MG tablet Take 40 mg by mouth daily before breakfast. 06/23/16   Historical Provider, MD  spironolactone (ALDACTONE) 25 MG tablet TAKE ONE-HALF (1/2) TABLET DAILY Patient taking differently: Take 12.5 mg by mouth in the morning 05/03/16   Duke Salvia, MD    Family History  Family History  Problem Relation Age of Onset  . Coronary artery disease Sister   . Coronary artery disease Brother     Social History  Social History   Social History  . Marital status: Married    Spouse name: N/A  . Number of children: N/A  . Years of education: N/A   Occupational History  . Not on file.   Social History Main Topics  . Smoking status: Never  Smoker  . Smokeless tobacco: Never Used  . Alcohol use No  . Drug use: No  . Sexual activity: Not on file   Other Topics Concern  . Not on file   Social History Narrative   Married. Has 1 surviving child and 4 surviving grandchildren.      Review of Systems General:  No chills, fever, night sweats or weight changes.  Cardiovascular:  No chest pain, dyspnea on exertion, edema, orthopnea, palpitations, paroxysmal nocturnal dyspnea. Dermatological: No rash, lesions/masses Respiratory: No cough, dyspnea Urologic: No hematuria, dysuria Abdominal:   No nausea, vomiting, diarrhea, bright red blood per rectum, melena, or hematemesis Neurologic:  No visual changes, wkns, changes in mental status. All other systems reviewed and are otherwise negative except as noted above.  Physical Exam  Blood pressure 91/63, pulse (!) 37, temperature 97.9 F (36.6 C), temperature source Oral, resp. rate 20, height 5\' 10"  (1.778 m), weight 211  lb (95.7 kg), SpO2 100 %.  General: Pleasant, NAD Psych: Normal affect. Neuro: Alert and oriented X 3. Moves all extremities spontaneously. HEENT: Normal  Neck: Supple without bruits or JVD. Lungs:  Resp regular and unlabored, CTA. Heart: RRR no s3, s4, or murmurs. Abdomen: Soft, non-tender, non-distended, BS + x 4.  Extremities: No clubbing, cyanosis or edema. DP/PT/Radials 2+ and equal bilaterally.  Labs  Troponin [facility] of Care Test)  Recent Labs  08/23/16 1444  TROPIPOC 0.15*   No results for input(s): CKTOTAL, CKMB, TROPONINI in the last 72 hours. Lab Results  Component Value Date   WBC 9.1 08/23/2016   HGB 11.8 (L) 08/23/2016   HCT 32.9 (L) 08/23/2016   MCV 78.5 08/23/2016   PLT 168 08/23/2016    Recent Labs Lab 08/23/16 1437  NA 121*  K 3.5  CL 80*  CO2 29  BUN 36*  CREATININE 1.56*  CALCIUM 9.2  GLUCOSE 132*   Lab Results  Component Value Date   CHOL 103 06/23/2012   HDL 32.90 (L) 06/23/2012   LDLCALC 53 06/23/2012   TRIG  84.0 06/23/2012   No results found for: DDIMER   Radiology/Studies  Dg Chest Portable 1 View  Result Date: 08/23/2016 CLINICAL DATA:  Hypotension.  Dizziness when standing. EXAM: PORTABLE CHEST 1 VIEW COMPARISON:  One-view chest x-ray 03/01/2010 FINDINGS: Cardiac enlargement is stable. Chronic interstitial coarsening is present without superimposed edema. AICD lead is stable. No focal airspace disease is present. Pacing wires are in place. Visualized soft tissues and bony thorax are unremarkable. IMPRESSION: 1. No acute cardiopulmonary disease. 2. Cardiomegaly without failure. Electronically Signed   By: Marin Roberts M.D.   On: 08/23/2016 15:49    ECG intra-ventricular conduction block, undetermined rhythm. His device was interrogated in the ED and shows underlying atrial fibrillation. No VT  ASSESSMENT AND PLAN  1. Hypotension: in the setting of medication mix up with diuretics. Pt was confused with medications and has been taking both Lasix and Torsemide both BID. He was suppose to have discontinued Lasix. He has also been taking spironolactone, Coreg and lisinopril. His Systolic BP is in the 80s. HR in the low 100s. BMP shows mild renal insufficiency BUN 36 and SCr 1.56 (improved from 2.3 after stopping Zaroxolyn. Baseline 1.3-1.4). Luckily he is asymptomatic. Na is also low at 121. He is mentating well. No AMS. We will plan to admit to telemetry. We will hold all antihypertensive meds for now. Reassess in the am. Repeat BMP in the am.   2. ? Atrial Fibrillation: There were verbal reports of possible PAF. His device was interrogated in ED earlier. No VT but afib was not confirmed. Monitor on telemetry.   3. CAD: h/o CABG in 2000. His most recent Myoview was performed in Jan 2012. At that time, his ejection fraction was 31%. There was prior apical, inferoseptal and inferior infarction, but there was no ischemia. He denies any CP or dyspnea. POC troponin however is 0.15. We will cycle  enzymes overnight.   4. ICD: defibrillator placed in 2008 for ICM, gen change 2015.  5. Chronic Systolic HF/ ICM: his last echo was 06/2012. EF was 45-50%. We will hold his home diuretics, BB, ACE-I and spironolactone given hypotension. He has 1+ bilateral LEE pitting edema on exam currently. No dyspnea. CXR unremarkable.  Holding diuretics given hypotension. Monitor volume/weight closely. Low sodium diet.   6. Hyponatremia: Na level is 121. He is asymptomatic. No AMS. Monitor closely. Repeat BMP in the am.  Signed, Robbie Lis, PA-C 08/23/2016, 4:03 PM

## 2016-08-23 NOTE — ED Triage Notes (Signed)
Pt sent by PCP today due to hypotension and tachycardia, BP was "80/56" and HR was 126 at doctors office. Currently BP 92/63, HR-101 and denies any pain. He reports dizziness only when he first stands up.

## 2016-08-24 ENCOUNTER — Observation Stay (HOSPITAL_BASED_OUTPATIENT_CLINIC_OR_DEPARTMENT_OTHER): Payer: Medicare Other

## 2016-08-24 ENCOUNTER — Inpatient Hospital Stay (HOSPITAL_COMMUNITY): Payer: Medicare Other

## 2016-08-24 DIAGNOSIS — T501X1A Poisoning by loop [high-ceiling] diuretics, accidental (unintentional), initial encounter: Secondary | ICD-10-CM | POA: Diagnosis present

## 2016-08-24 DIAGNOSIS — N179 Acute kidney failure, unspecified: Secondary | ICD-10-CM | POA: Diagnosis not present

## 2016-08-24 DIAGNOSIS — Z23 Encounter for immunization: Secondary | ICD-10-CM | POA: Diagnosis present

## 2016-08-24 DIAGNOSIS — E861 Hypovolemia: Secondary | ICD-10-CM | POA: Diagnosis present

## 2016-08-24 DIAGNOSIS — E871 Hypo-osmolality and hyponatremia: Secondary | ICD-10-CM | POA: Diagnosis present

## 2016-08-24 DIAGNOSIS — K7200 Acute and subacute hepatic failure without coma: Secondary | ICD-10-CM | POA: Diagnosis not present

## 2016-08-24 DIAGNOSIS — I959 Hypotension, unspecified: Secondary | ICD-10-CM | POA: Diagnosis present

## 2016-08-24 DIAGNOSIS — D696 Thrombocytopenia, unspecified: Secondary | ICD-10-CM | POA: Diagnosis not present

## 2016-08-24 DIAGNOSIS — I130 Hypertensive heart and chronic kidney disease with heart failure and stage 1 through stage 4 chronic kidney disease, or unspecified chronic kidney disease: Secondary | ICD-10-CM | POA: Diagnosis present

## 2016-08-24 DIAGNOSIS — Z79899 Other long term (current) drug therapy: Secondary | ICD-10-CM | POA: Diagnosis not present

## 2016-08-24 DIAGNOSIS — E785 Hyperlipidemia, unspecified: Secondary | ICD-10-CM | POA: Diagnosis present

## 2016-08-24 DIAGNOSIS — M069 Rheumatoid arthritis, unspecified: Secondary | ICD-10-CM | POA: Diagnosis present

## 2016-08-24 DIAGNOSIS — Z95810 Presence of automatic (implantable) cardiac defibrillator: Secondary | ICD-10-CM | POA: Diagnosis not present

## 2016-08-24 DIAGNOSIS — I509 Heart failure, unspecified: Secondary | ICD-10-CM | POA: Diagnosis not present

## 2016-08-24 DIAGNOSIS — I255 Ischemic cardiomyopathy: Secondary | ICD-10-CM | POA: Diagnosis present

## 2016-08-24 DIAGNOSIS — R339 Retention of urine, unspecified: Secondary | ICD-10-CM

## 2016-08-24 DIAGNOSIS — R570 Cardiogenic shock: Secondary | ICD-10-CM | POA: Diagnosis not present

## 2016-08-24 DIAGNOSIS — I2510 Atherosclerotic heart disease of native coronary artery without angina pectoris: Secondary | ICD-10-CM | POA: Diagnosis not present

## 2016-08-24 DIAGNOSIS — I4891 Unspecified atrial fibrillation: Secondary | ICD-10-CM | POA: Diagnosis not present

## 2016-08-24 DIAGNOSIS — E1122 Type 2 diabetes mellitus with diabetic chronic kidney disease: Secondary | ICD-10-CM | POA: Diagnosis present

## 2016-08-24 DIAGNOSIS — N170 Acute kidney failure with tubular necrosis: Secondary | ICD-10-CM | POA: Diagnosis not present

## 2016-08-24 DIAGNOSIS — I454 Nonspecific intraventricular block: Secondary | ICD-10-CM | POA: Diagnosis present

## 2016-08-24 DIAGNOSIS — Z8711 Personal history of peptic ulcer disease: Secondary | ICD-10-CM | POA: Diagnosis not present

## 2016-08-24 DIAGNOSIS — I2589 Other forms of chronic ischemic heart disease: Secondary | ICD-10-CM

## 2016-08-24 DIAGNOSIS — I248 Other forms of acute ischemic heart disease: Secondary | ICD-10-CM | POA: Diagnosis present

## 2016-08-24 DIAGNOSIS — Z951 Presence of aortocoronary bypass graft: Secondary | ICD-10-CM | POA: Diagnosis not present

## 2016-08-24 DIAGNOSIS — N183 Chronic kidney disease, stage 3 (moderate): Secondary | ICD-10-CM | POA: Diagnosis present

## 2016-08-24 DIAGNOSIS — R748 Abnormal levels of other serum enzymes: Secondary | ICD-10-CM

## 2016-08-24 DIAGNOSIS — Z7982 Long term (current) use of aspirin: Secondary | ICD-10-CM | POA: Diagnosis not present

## 2016-08-24 DIAGNOSIS — F419 Anxiety disorder, unspecified: Secondary | ICD-10-CM | POA: Diagnosis present

## 2016-08-24 DIAGNOSIS — I5043 Acute on chronic combined systolic (congestive) and diastolic (congestive) heart failure: Secondary | ICD-10-CM | POA: Diagnosis present

## 2016-08-24 DIAGNOSIS — I472 Ventricular tachycardia: Secondary | ICD-10-CM | POA: Diagnosis not present

## 2016-08-24 LAB — COMPREHENSIVE METABOLIC PANEL
ALT: 960 U/L — ABNORMAL HIGH (ref 17–63)
AST: 1010 U/L — ABNORMAL HIGH (ref 15–41)
Albumin: 3.5 g/dL (ref 3.5–5.0)
Alkaline Phosphatase: 306 U/L — ABNORMAL HIGH (ref 38–126)
Anion gap: 13 (ref 5–15)
BUN: 36 mg/dL — ABNORMAL HIGH (ref 6–20)
CHLORIDE: 81 mmol/L — AB (ref 101–111)
CO2: 25 mmol/L (ref 22–32)
Calcium: 9 mg/dL (ref 8.9–10.3)
Creatinine, Ser: 2.19 mg/dL — ABNORMAL HIGH (ref 0.61–1.24)
GFR calc non Af Amer: 27 mL/min — ABNORMAL LOW (ref >60)
GFR, EST AFRICAN AMERICAN: 31 mL/min — AB (ref >60)
Glucose, Bld: 217 mg/dL — ABNORMAL HIGH (ref 65–99)
POTASSIUM: 4.3 mmol/L (ref 3.5–5.1)
Sodium: 119 mmol/L — CL (ref 135–145)
TOTAL PROTEIN: 6 g/dL — AB (ref 6.5–8.1)
Total Bilirubin: 1.4 mg/dL — ABNORMAL HIGH (ref 0.3–1.2)

## 2016-08-24 LAB — BASIC METABOLIC PANEL
ANION GAP: 11 (ref 5–15)
BUN: 29 mg/dL — AB (ref 6–20)
CO2: 27 mmol/L (ref 22–32)
Calcium: 9 mg/dL (ref 8.9–10.3)
Chloride: 84 mmol/L — ABNORMAL LOW (ref 101–111)
Creatinine, Ser: 1.37 mg/dL — ABNORMAL HIGH (ref 0.61–1.24)
GFR calc Af Amer: 55 mL/min — ABNORMAL LOW (ref >60)
GFR, EST NON AFRICAN AMERICAN: 47 mL/min — AB (ref >60)
Glucose, Bld: 124 mg/dL — ABNORMAL HIGH (ref 65–99)
POTASSIUM: 3.7 mmol/L (ref 3.5–5.1)
SODIUM: 122 mmol/L — AB (ref 135–145)

## 2016-08-24 LAB — BRAIN NATRIURETIC PEPTIDE: B NATRIURETIC PEPTIDE 5: 558.9 pg/mL — AB (ref 0.0–100.0)

## 2016-08-24 LAB — CBC
HEMATOCRIT: 31.2 % — AB (ref 39.0–52.0)
HEMOGLOBIN: 10.8 g/dL — AB (ref 13.0–17.0)
MCH: 27.6 pg (ref 26.0–34.0)
MCHC: 34.6 g/dL (ref 30.0–36.0)
MCV: 79.6 fL (ref 78.0–100.0)
Platelets: 147 10*3/uL — ABNORMAL LOW (ref 150–400)
RBC: 3.92 MIL/uL — ABNORMAL LOW (ref 4.22–5.81)
RDW: 13.8 % (ref 11.5–15.5)
WBC: 5.6 10*3/uL (ref 4.0–10.5)

## 2016-08-24 LAB — COOXEMETRY PANEL
CARBOXYHEMOGLOBIN: 0.9 % (ref 0.5–1.5)
METHEMOGLOBIN: 1 % (ref 0.0–1.5)
O2 Saturation: 50 %
TOTAL HEMOGLOBIN: 11.6 g/dL — AB (ref 12.0–16.0)

## 2016-08-24 LAB — HEPARIN LEVEL (UNFRACTIONATED): HEPARIN UNFRACTIONATED: 0.32 [IU]/mL (ref 0.30–0.70)

## 2016-08-24 LAB — MRSA PCR SCREENING: MRSA by PCR: NEGATIVE

## 2016-08-24 LAB — TROPONIN I
TROPONIN I: 0.53 ng/mL — AB (ref <0.03)
Troponin I: 0.7 ng/mL (ref <0.03)

## 2016-08-24 LAB — ECHOCARDIOGRAM COMPLETE
Height: 70 in
Weight: 3174.4 oz

## 2016-08-24 LAB — GLUCOSE, CAPILLARY
GLUCOSE-CAPILLARY: 289 mg/dL — AB (ref 65–99)
Glucose-Capillary: 115 mg/dL — ABNORMAL HIGH (ref 65–99)
Glucose-Capillary: 251 mg/dL — ABNORMAL HIGH (ref 65–99)
Glucose-Capillary: 245 mg/dL — ABNORMAL HIGH (ref 65–99)

## 2016-08-24 LAB — MAGNESIUM: MAGNESIUM: 1.8 mg/dL (ref 1.7–2.4)

## 2016-08-24 MED ORDER — ONDANSETRON 4 MG PO TBDP
4.0000 mg | ORAL_TABLET | Freq: Once | ORAL | Status: AC
Start: 2016-08-24 — End: 2016-08-24
  Administered 2016-08-24: 4 mg via ORAL
  Filled 2016-08-24 (×2): qty 1

## 2016-08-24 MED ORDER — AMIODARONE HCL IN DEXTROSE 360-4.14 MG/200ML-% IV SOLN
60.0000 mg/h | INTRAVENOUS | Status: AC
  Filled 2016-08-24: qty 200

## 2016-08-24 MED ORDER — HEPARIN BOLUS VIA INFUSION
3000.0000 [IU] | Freq: Once | INTRAVENOUS | Status: AC
  Administered 2016-08-24: 3000 [IU] via INTRAVENOUS
  Filled 2016-08-24: qty 3000

## 2016-08-24 MED ORDER — HEPARIN (PORCINE) IN NACL 100-0.45 UNIT/ML-% IJ SOLN
1100.0000 [IU]/h | INTRAMUSCULAR | Status: DC
  Administered 2016-08-24 – 2016-08-26 (×4): 1300 [IU]/h via INTRAVENOUS
  Filled 2016-08-24 (×4): qty 250

## 2016-08-24 MED ORDER — MILRINONE LACTATE IN DEXTROSE 20-5 MG/100ML-% IV SOLN
0.0625 ug/kg/min | INTRAVENOUS | Status: DC
  Administered 2016-08-24: 0.0625 ug/kg/min via INTRAVENOUS
  Filled 2016-08-24: qty 100

## 2016-08-24 MED ORDER — FUROSEMIDE 10 MG/ML IJ SOLN
60.0000 mg | Freq: Two times a day (BID) | INTRAMUSCULAR | Status: DC
  Administered 2016-08-24: 60 mg via INTRAVENOUS
  Filled 2016-08-24: qty 6

## 2016-08-24 MED ORDER — DOCUSATE SODIUM 100 MG PO CAPS
100.0000 mg | ORAL_CAPSULE | Freq: Every day | ORAL | Status: DC
  Administered 2016-08-24 – 2016-09-02 (×10): 100 mg via ORAL
  Filled 2016-08-24 (×10): qty 1

## 2016-08-24 MED ORDER — PROMETHAZINE HCL 25 MG/ML IJ SOLN
6.2500 mg | Freq: Four times a day (QID) | INTRAMUSCULAR | Status: DC | PRN
  Administered 2016-08-26 – 2016-08-27 (×2): 6.25 mg via INTRAVENOUS
  Filled 2016-08-24 (×3): qty 1

## 2016-08-24 MED ORDER — MORPHINE SULFATE (PF) 2 MG/ML IV SOLN
INTRAVENOUS | Status: AC
Start: 2016-08-24 — End: 2016-08-24
  Filled 2016-08-24: qty 1

## 2016-08-24 MED ORDER — AMIODARONE LOAD VIA INFUSION
150.0000 mg | Freq: Once | INTRAVENOUS | Status: AC
  Administered 2016-08-24: 150 mg via INTRAVENOUS

## 2016-08-24 MED ORDER — MORPHINE SULFATE (PF) 2 MG/ML IV SOLN
1.0000 mg | Freq: Once | INTRAVENOUS | Status: AC
  Administered 2016-08-24: 1 mg via INTRAVENOUS

## 2016-08-24 MED ORDER — AMIODARONE HCL IN DEXTROSE 360-4.14 MG/200ML-% IV SOLN
30.0000 mg/h | INTRAVENOUS | Status: DC
  Administered 2016-08-24 – 2016-08-25 (×2): 30 mg/h via INTRAVENOUS
  Filled 2016-08-24 (×2): qty 200

## 2016-08-24 MED ORDER — DEXTROSE 5 % IV SOLN
0.0000 ug/min | INTRAVENOUS | Status: DC
  Administered 2016-08-24: 10.667 ug/min via INTRAVENOUS
  Administered 2016-08-24: 10 ug/min via INTRAVENOUS
  Filled 2016-08-24 (×2): qty 16

## 2016-08-24 MED ORDER — AMIODARONE HCL IN DEXTROSE 360-4.14 MG/200ML-% IV SOLN
INTRAVENOUS | Status: AC
  Filled 2016-08-24: qty 200

## 2016-08-24 NOTE — Progress Notes (Signed)
Patient Name: [name] Date of Encounter: 08/24/2016  Primary Cardiologist: Dr. [name] EP: Dr. [facility] Problem List     Active Problems:   Chronic systolic heart failure [facility])   Ventricular tachycardia-treated the ATP   Acute on chronic combined systolic and diastolic congestive heart failure (HCC)   Chronic renal failure in pediatric patient, stage 3 (moderate)   Hypotension   Subjective   Complains of abdominal pain and intermittent dyspneic.   Inpatient Medications    Scheduled Meds: . aspirin EC  81 mg Oral Daily  . atorvastatin  10 mg Oral Daily  . heparin  5,000 Units Subcutaneous Q8H  . insulin aspart  0-15 Units Subcutaneous TID WC  . pantoprazole  80 mg Oral Q1200   Continuous Infusions: . amiodarone 60 mg/hr (08/24/16 0904)   Followed by  . amiodarone     PRN Meds:    Vital Signs    Vitals:   08/23/16 2000 08/24/16 0530 08/24/16 0537 08/24/16 0848  BP:  (!) 83/50  107/86  Pulse:  67  (!) 162  Resp:  17  17  Temp: 97.7 F (36.5 C)  97.9 F (36.6 C)   TempSrc:      SpO2:  98%  98%  Weight:   198 lb 6.4 oz (90 kg)   Height:        Intake/Output Summary (Last 24 hours) at 08/24/16 1032 Last data filed at 08/24/16 0930  Gross per 24 hour  Intake              740 ml  Output              650 ml  Net               90 ml   Filed Weights   08/23/16 1359 08/24/16 0537  Weight: 211 lb (95.7 kg) 198 lb 6.4 oz (90 kg)    Physical Exam    GEN: Well nourished, well developed, in no acute distress.  HEENT: Grossly normal.  Neck: Supple, carotid bruits, or masses. + JVD Cardiac: RRR, no murmurs, rubs, or gallops. No clubbing, cyanosis Radials/DP/PT 2+ and equal bilaterally.  1+ BL Edema Respiratory:  Respirations regular and unlabored, clear to auscultation bilaterally. GI: Soft,  nondistended, BS + x 4. Diffuse TTP.  MS: no deformity or atrophy. Skin: warm and dry, no rash. Neuro:  Strength and sensation are intact. Psych: AAOx3.   Normal affect.  Labs    CBC  Recent Labs  08/23/16 1749 08/24/16 0516  WBC 7.6 5.6  HGB 11.3* 10.8*  HCT 31.7* 31.2*  MCV 78.9 79.6  PLT 160 147*   Basic Metabolic Panel  Recent Labs  08/23/16 1437 08/23/16 1749 08/23/16 2302 08/24/16 0516  NA 121*  --   --  122*  K 3.5  --   --  3.7  CL 80*  --   --  84*  CO2 29  --   --  27  GLUCOSE 132*  --   --  124*  BUN 36*  --   --  29*  CREATININE 1.56* 1.43*  --  1.37*  CALCIUM 9.2  --   --  9.0  MG  --   --  1.8  --    Liver Function Tests No results for input(s): AST, ALT, ALKPHOS, BILITOT, PROT, ALBUMIN in the last 72 hours. No results for input(s): LIPASE, AMYLASE in the last 72 hours. Cardiac Enzymes  Recent  Labs  08/23/16 1749 08/23/16 2302 08/24/16 0516  TROPONINI 0.46* 0.70* 0.53*     Telemetry    Sinus arrhythmia with PVCs, PACs and NSVT with ? AFib  - Personally Reviewed  ECG    N/A - Personally Reviewed  Radiology    Dg Chest Portable 1 View  Result Date: 08/23/2016 CLINICAL DATA:  Hypotension.  Dizziness when standing. EXAM: PORTABLE CHEST 1 VIEW COMPARISON:  One-view chest x-ray 03/01/2010 FINDINGS: Cardiac enlargement is stable. Chronic interstitial coarsening is present without superimposed edema. AICD lead is stable. No focal airspace disease is present. Pacing wires are in place. Visualized soft tissues and bony thorax are unremarkable. IMPRESSION: 1. No acute cardiopulmonary disease. 2. Cardiomegaly without failure. Electronically Signed   By: [name] M.D.   On: 08/23/2016 15:49   Cardiac Studies   None  Patient Profile     [name] is a 80M with CAD s/p CABG, hypertension, hyperlipidemia and chronic systolic and diastolic heart failure (LVEF 45-50%), ICM s/p ICD, HTN, HLD, DM, CKD stage III, here with hypotension in the setting of over diuresis and taking lasix and torsemide at the same time.  Assessment & Plan    1. Acute on chronic combined CHF/ICM - Last echo in   November of 2013 showed an ejection fraction of 45-50%, grade 1 diastolic dysfunction, moderate left atrial and mild right ventricular/right atrial enlargement. - Admitted for hypotension due to over diuresis. Hydration given in ER.  Diuretics on hold.  BNP of 961.2. Now exam consistent with fluid overload. His BP has been improved slightly.  - Will discussed with MD regarding restarting diuretics. Update echo today to guide medical therapy. ?ionotrops.   2. Sinus arrhthymias - Device check yesterday did not showed AF or VT. Frequent PVCs and PACs. This morning his rate went up 160s with irregular rate. Also has long rungs of NSVT. Started on IV amiodarone with rate improved to 90s in sinus rhythm. Consider EP consult.  K of 3.7 and Mg of 1.8. Consider given supplement.   3. Urinary retension - No urinary issue prior to admission. Yesterday required I & O cath for >800 CC in bladder. This morning required foley for 475cc urine retention. She was in severe pain and penis turned red during during foley insertion required to take out. Consider urology consult. Also complains of bladder fulness and lower abdomen pain.   4. ICD - Followed by Dr. Graciela Husbands. Defibrillator placed in 2008 for ICM, gen change 2015.  5. Hyponatremia - Na of 122 this morning.  6. Elevated troponin - POC troponin 0.15. Troponin trend of 0.46-->0.7-->0.53. No active chest pain. ? Demand in setting of elevated rate.   7. CAD s/p CABG - Last stress test was done in Jan 2012. There was prior apical, inferoseptal and inferior infarction, but there was no ischemia. - Likely outpatient stress test. Continue ASA and statin.,   8. HLD - Will update lipid panel. Continue statin.   9. CKD stage III - Baseline Scr 1.3-1.4. Scr of 1.37.   Signed, Manson Passey, PA  08/24/2016, 10:32 AM

## 2016-08-24 NOTE — Care Management Note (Addendum)
Case Management Note  Patient Details  Name: [name] MRN: [id number] Date of Birth: 08-18-1934  Subjective/Objective:     Hypotension, hx Afib, CAD, ICD interrogated in ED, CHF               Action/Plan: Discharge Planning: NCM spoke to pt and lives at home with grandson, [name]. States he has a motorized scooter x 2, cane and RW at home. Uses his motorized scooter in the home. Pt states he can still drive but his son, [name] takes him to his appts. Pt active with [facility] for [facility] RN. Pt may benefits for [facility] PT also, will need PT recommendations. Will need resu   PCP Lucila Maine B MD  Expected Discharge Date:                Expected Discharge Plan:  Home/Self Care  In-House Referral:  NA  Discharge planning Services  CM Consult  Post Acute Care Choice:  NA Choice offered to:  NA  DME Arranged:  N/A DME Agency:  NA  HH Arranged:  NA HH Agency:  NA  Status of Service:  In process, will continue to follow  If discussed at Long Length of Stay Meetings, dates discussed:    Additional Comments:  Elliot Cousin, RN 08/24/2016, 9:29 AM

## 2016-08-24 NOTE — Progress Notes (Signed)
MEDICATION RELATED CONSULT NOTE - INITIAL   Pharmacy Consult for Milrinone Indication: Inotropic support  No Known Allergies  Patient Measurements: Height: 5\' 10"  (177.8 cm) Weight: 198 lb 6.4 oz (90 kg) IBW/kg (Calculated) : 73  Vital Signs: Temp: 98 F (36.7 C) (01/09 1229) BP: 72/58 (01/09 1602) Pulse Rate: 71 (01/09 1602) Intake/Output from previous day: 01/08 0701 - 01/09 0700 In: 500 [IV Piggyback:500] Out: 650 [Urine:650] Intake/Output from this shift: Total I/O In: 480 [P.O.:480] Out: -   Labs:  Recent Labs  08/23/16 1437 08/23/16 1749 08/23/16 2302 08/24/16 0516  WBC 9.1 7.6  --  5.6  HGB 11.8* 11.3*  --  10.8*  HCT 32.9* 31.7*  --  31.2*  PLT 168 160  --  147*  CREATININE 1.56* 1.43*  --  1.37*  MG  --   --  1.8  --    Estimated Creatinine Clearance: 49.3 mL/min (by C-G formula based on SCr of 1.37 mg/dL (H)).   Microbiology: Recent Results (from the past 720 hour(s))  MRSA PCR Screening     Status: None   Collection Time: 08/24/16  7:48 AM  Result Value Ref Range Status   MRSA by PCR NEGATIVE NEGATIVE Final    Comment:        The GeneXpert MRSA Assay (FDA approved for NASAL specimens only), is one component of a comprehensive MRSA colonization surveillance program. It is not intended to diagnose MRSA infection nor to guide or monitor treatment for MRSA infections.     Medical History: Past Medical History:  Diagnosis Date  . Congestive heart failure, unspecified   . Coronary atherosclerosis of unspecified type of vessel, native or graft   . ICD (implantable cardiac defibrillator), single BSX    DOI 2008  . Ischemic cardiomyopathy   . Other and unspecified hyperlipidemia   . Peptic ulcer, unspecified site, unspecified as acute or chronic, without mention of hemorrhage, perforation, or obstruction   . Rheumatoid arthritis(714.0)   . Type II or unspecified type diabetes mellitus without mention of complication, not stated as  uncontrolled   . Unspecified essential hypertension   . Ventricular tachycardia (HCC)    Rx via ICD 5 /2012    Medications:  Scheduled:  . aspirin EC  81 mg Oral Daily  . atorvastatin  10 mg Oral Daily  . docusate sodium  100 mg Oral Daily  . insulin aspart  0-15 Units Subcutaneous TID WC  . pantoprazole  80 mg Oral Q1200    Assessment: 82yo male with acute on chronic CHF/ICM on Amiodarone drip for tachyarrhythmia, now with hypotension and poor urine output & to start Milrinone for inotropic support.  Given hypotension, will start with low end of dosing range as well as adjust for renal function.  Plan:  Milrinone 0.0671mcg/kg/min  Further rate adjustments per MD  32m, PharmD Clinical Pharmacist [facility]

## 2016-08-24 NOTE — Progress Notes (Signed)
Status update  Mr. Schoch is not making urine and is hypotensive.  He appears to be in cardiogenic shock (cold and wet).  He has increasing lower extremity edema and crackles on exam.  We will transfer him to 4N to start milrinone so that he can be diuresed.  We will check co-ox q12h. Check CMP and BNP.  Once his BP improves we will start diuresis.  If there is no improvement we will consult heart failure.  Patient reiterates that code status is full code.  Time spent: 60 minutes-Greater than 50% of this time was spent in counseling, explanation of diagnosis, planning of further management, and coordination of care.   Terrance Usery C. Duke Salvia, MD, [facility] 08/24/2016 4:27 PM

## 2016-08-24 NOTE — Progress Notes (Signed)
ANTICOAGULATION CONSULT NOTE - Initial Consult  Pharmacy Consult for Heparin Indication: atrial fibrillation  No Known Allergies  Patient Measurements: Height: 5\' 10"  (177.8 cm) Weight: 198 lb 6.4 oz (90 kg) IBW/kg (Calculated) : 73 Heparin Dosing Weight: 90 kg  Vital Signs: Temp: 98 F (36.7 C) (01/09 1229) BP: 77/62 (01/09 1229) Pulse Rate: 85 (01/09 1229)  Labs:  Recent Labs  08/23/16 1437 08/23/16 1749 08/23/16 2302 08/24/16 0516  HGB 11.8* 11.3*  --  10.8*  HCT 32.9* 31.7*  --  31.2*  PLT 168 160  --  147*  CREATININE 1.56* 1.43*  --  1.37*  TROPONINI  --  0.46* 0.70* 0.53*    Estimated Creatinine Clearance: 49.3 mL/min (by C-G formula based on SCr of 1.37 mg/dL (H)).   Medical History: Past Medical History:  Diagnosis Date  . Congestive heart failure, unspecified   . Coronary atherosclerosis of unspecified type of vessel, native or graft   . ICD (implantable cardiac defibrillator), single BSX    DOI 2008  . Ischemic cardiomyopathy   . Other and unspecified hyperlipidemia   . Peptic ulcer, unspecified site, unspecified as acute or chronic, without mention of hemorrhage, perforation, or obstruction   . Rheumatoid arthritis(714.0)   . Type II or unspecified type diabetes mellitus without mention of complication, not stated as uncontrolled   . Unspecified essential hypertension   . Ventricular tachycardia (HCC)    Rx via ICD 5 /2012    Medications:  Prescriptions Prior to Admission  Medication Sig Dispense Refill Last Dose  . ACCU-CHEK AVIVA PLUS test strip CHECK BLOOD SUGAR TWICE A DAY  1 Taking  . aspirin EC 81 MG tablet Take 81 mg by mouth daily.   08/23/2016 at am  . atorvastatin (LIPITOR) 10 MG tablet Take 10 mg by mouth daily.     08/23/2016 at am  . carvedilol (COREG) 3.125 MG tablet Take 1 tablet (3.125 mg total) by mouth 2 (two) times daily with a meal. 180 tablet 3 08/23/2016 at 0800  . esomeprazole (NEXIUM) 40 MG capsule Take 40 mg by mouth daily.      08/23/2016 at am  . furosemide (LASIX) 20 MG tablet Take 40 mg by mouth 2 (two) times daily.   08/23/2016 at am  . glyBURIDE-metformin (GLUCOVANCE) 5-500 MG per tablet Take 1 tablet by mouth daily.     08/23/2016 at am  . insulin glargine (LANTUS) 100 UNIT/ML injection Inject 10-40 Units into the skin at bedtime as needed. Sliding scale.  If cbg= 110: none, cbg=200 take 25-30 units, cbg over 250 take 40 units   08/22/2016 at pm  . lisinopril (PRINIVIL,ZESTRIL) 2.5 MG tablet Take 1 tablet (2.5 mg total) by mouth daily. 90 tablet 3 08/23/2016 at am  . spironolactone (ALDACTONE) 25 MG tablet TAKE ONE-HALF (1/2) TABLET DAILY (Patient taking differently: Take 12.5 mg by mouth in the morning) 45 tablet 10 08/23/2016 at am  . torsemide (DEMADEX) 20 MG tablet Take 1 tablet (20 mg total) by mouth daily. (Patient taking differently: Take 20 mg by mouth 2 (two) times daily. ) 30 tablet 11 08/23/2016 at am    Assessment: 82 yo M  presented to ED with hypotension and tachycardia.  Found to be in afib and started on Amio infusion with return of NSR.  To start heparin for afib.  Goal of Therapy:  Heparin level 0.3-0.7 units/ml Monitor platelets by anticoagulation protocol: Yes   Plan:  Discontinue heparin SQ - no doses given Heparin 3000  unit IV bolus x 1 Heparin infusion at 1300 units/hr Heparin level in 6 hours Heparin level and CBC daily while on heparin  [employer], Pharm.D., BCPS Clinical Pharmacist Pager [phone] 08/24/2016 12:38 PM

## 2016-08-24 NOTE — Progress Notes (Signed)
CRITICAL VALUE ALERT  Critical value received:  Na 119  Date of notification: 08/24/2016  Time of notification:  2020  Critical value read back:Yes.    Nurse who received alert:  Dorna Leitz RN  MD notified (1st page):  Dr. Clarnce Flock   Time of first page:  2025    Responding MD:  Dr. Clarnce Flock  Time MD responded:  2050 no new orders

## 2016-08-24 NOTE — Progress Notes (Signed)
Patient complains of N/V, also hypotensive asymptomatic, PA on call made aware Zofran ordered no treatment of hypotension at this time per PA. Will continue to monitor.

## 2016-08-24 NOTE — Progress Notes (Signed)
Patient now in SR, MD and PA made aware, also discussed urology issues, patient still unable to void. Urology to consult.

## 2016-08-24 NOTE — Progress Notes (Signed)
Central Venous Catheter Insertion Procedure Note [name] [id number] [birth year]  Procedure: Insertion of Central Venous Catheter Indications: Assessment of intravascular volume, Drug and/or fluid administration and Frequent blood sampling  Procedure Details Consent: Risks of procedure as well as the alternatives and risks of each were explained to the (patient/caregiver).  Consent for procedure obtained. Time Out: Verified patient identification, verified procedure, site/side was marked, verified correct patient position, special equipment/implants available, medications/allergies/relevent history reviewed, required imaging and test results available.  Performed  Maximum sterile technique was used including antiseptics, cap, gloves, gown, hand hygiene, mask and sheet. Skin prep: Chlorhexidine; local anesthetic administered A antimicrobial bonded/coated triple lumen catheter was placed in the left internal jugular vein using the Seldinger technique.  Evaluation Blood flow good Complications: No apparent complications Patient did tolerate procedure well. Chest X-ray ordered to verify placement.  CXR: pending.  Procedure performed under direct ultrasound guidance for real time vessel cannulation.      Rutherford Guys, [place] [facility] [place] Pager: [phone]  or [phone] 08/24/2016, 6:03 PM

## 2016-08-24 NOTE — Progress Notes (Signed)
Inserted foley per MD order, foley successfully placed with 475 urine return, patient screaming out with pain foley removed and MD paged.

## 2016-08-24 NOTE — Progress Notes (Signed)
  Echocardiogram 2D Echocardiogram has been performed.  Leta Jungling M 08/24/2016, 11:46 AM

## 2016-08-24 NOTE — Care Management Obs Status (Signed)
MEDICARE OBSERVATION STATUS NOTIFICATION   Patient Details  Name: [name] MRN: [id number] Date of Birth: February 28, 1935   Medicare Observation Status Notification Given:  Yes    Elliot Cousin, RN 08/24/2016, 9:27 AM

## 2016-08-24 NOTE — Progress Notes (Signed)
Patients pressures remain low, not making urine. Dr. Duke Salvia paged. Orders received to transfer to ICU, 4N bed requested. Will give report, notify family and transfer patient with belongings.

## 2016-08-25 ENCOUNTER — Other Ambulatory Visit (HOSPITAL_COMMUNITY): Payer: Medicare Other

## 2016-08-25 ENCOUNTER — Inpatient Hospital Stay (HOSPITAL_COMMUNITY): Payer: Medicare Other

## 2016-08-25 DIAGNOSIS — I4891 Unspecified atrial fibrillation: Secondary | ICD-10-CM | POA: Diagnosis present

## 2016-08-25 DIAGNOSIS — N170 Acute kidney failure with tubular necrosis: Secondary | ICD-10-CM

## 2016-08-25 LAB — BASIC METABOLIC PANEL
Anion gap: 10 (ref 5–15)
Anion gap: 11 (ref 5–15)
BUN: 39 mg/dL — AB (ref 6–20)
BUN: 39 mg/dL — ABNORMAL HIGH (ref 6–20)
CALCIUM: 9 mg/dL (ref 8.9–10.3)
CHLORIDE: 82 mmol/L — AB (ref 101–111)
CHLORIDE: 81 mmol/L — AB (ref 101–111)
CO2: 27 mmol/L (ref 22–32)
CO2: 29 mmol/L (ref 22–32)
CREATININE: 2.37 mg/dL — AB (ref 0.61–1.24)
CREATININE: 2.2 mg/dL — AB (ref 0.61–1.24)
Calcium: 9.2 mg/dL (ref 8.9–10.3)
GFR calc Af Amer: 28 mL/min — ABNORMAL LOW (ref >60)
GFR calc Af Amer: 31 mL/min — ABNORMAL LOW (ref >60)
GFR calc non Af Amer: 24 mL/min — ABNORMAL LOW (ref >60)
GFR calc non Af Amer: 27 mL/min — ABNORMAL LOW (ref >60)
GLUCOSE: 52 mg/dL — AB (ref 65–99)
Glucose, Bld: 172 mg/dL — ABNORMAL HIGH (ref 65–99)
Potassium: 4.1 mmol/L (ref 3.5–5.1)
Potassium: 3.2 mmol/L — ABNORMAL LOW (ref 3.5–5.1)
SODIUM: 119 mmol/L — AB (ref 135–145)
SODIUM: 121 mmol/L — AB (ref 135–145)

## 2016-08-25 LAB — CBC
HCT: 33.3 % — ABNORMAL LOW (ref 39.0–52.0)
HEMOGLOBIN: 11.6 g/dL — AB (ref 13.0–17.0)
MCH: 27.3 pg (ref 26.0–34.0)
MCHC: 34.8 g/dL (ref 30.0–36.0)
MCV: 78.4 fL (ref 78.0–100.0)
Platelets: 157 10*3/uL (ref 150–400)
RBC: 4.25 MIL/uL (ref 4.22–5.81)
RDW: 13.4 % (ref 11.5–15.5)
WBC: 11.3 10*3/uL — ABNORMAL HIGH (ref 4.0–10.5)

## 2016-08-25 LAB — COOXEMETRY PANEL
CARBOXYHEMOGLOBIN: 1.3 % (ref 0.5–1.5)
Carboxyhemoglobin: 0.8 % (ref 0.5–1.5)
Carboxyhemoglobin: 1.6 % — ABNORMAL HIGH (ref 0.5–1.5)
Methemoglobin: 1 % (ref 0.0–1.5)
Methemoglobin: 0.7 % (ref 0.0–1.5)
Methemoglobin: 0.9 % (ref 0.0–1.5)
O2 SAT: 50.1 %
O2 SAT: 68 %
O2 Saturation: 70.7 %
TOTAL HEMOGLOBIN: 11.7 g/dL — AB (ref 12.0–16.0)
Total hemoglobin: 11.4 g/dL — ABNORMAL LOW (ref 12.0–16.0)
Total hemoglobin: 11.5 g/dL — ABNORMAL LOW (ref 12.0–16.0)

## 2016-08-25 LAB — LIPID PANEL
CHOLESTEROL: 71 mg/dL (ref 0–200)
HDL: 33 mg/dL — ABNORMAL LOW (ref >40)
LDL Cholesterol: 32 mg/dL (ref 0–99)
TRIGLYCERIDES: 28 mg/dL (ref <150)
Total CHOL/HDL Ratio: 2.2 RATIO
VLDL: 6 mg/dL (ref 0–40)

## 2016-08-25 LAB — GLUCOSE, CAPILLARY
GLUCOSE-CAPILLARY: 130 mg/dL — AB (ref 65–99)
Glucose-Capillary: 153 mg/dL — ABNORMAL HIGH (ref 65–99)
Glucose-Capillary: 327 mg/dL — ABNORMAL HIGH (ref 65–99)
Glucose-Capillary: 48 mg/dL — ABNORMAL LOW (ref 65–99)
Glucose-Capillary: 107 mg/dL — ABNORMAL HIGH (ref 65–99)

## 2016-08-25 LAB — HEPARIN LEVEL (UNFRACTIONATED): HEPARIN UNFRACTIONATED: 0.44 [IU]/mL (ref 0.30–0.70)

## 2016-08-25 MED ORDER — POTASSIUM CHLORIDE CRYS ER 20 MEQ PO TBCR
20.0000 meq | EXTENDED_RELEASE_TABLET | Freq: Three times a day (TID) | ORAL | Status: AC
  Administered 2016-08-25 – 2016-08-26 (×2): 20 meq via ORAL
  Filled 2016-08-25 (×2): qty 1

## 2016-08-25 MED ORDER — SODIUM CHLORIDE 0.9% FLUSH: 10.0000 mL | INTRAVENOUS | Status: DC | PRN

## 2016-08-25 MED ORDER — MILRINONE LACTATE IN DEXTROSE 20-5 MG/100ML-% IV SOLN
0.2500 ug/kg/min | INTRAVENOUS | Status: DC
  Administered 2016-08-25 – 2016-08-26 (×2): 0.25 ug/kg/min via INTRAVENOUS
  Filled 2016-08-25 (×2): qty 100

## 2016-08-25 MED ORDER — TOLVAPTAN 15 MG PO TABS
15.0000 mg | ORAL_TABLET | ORAL | Status: AC
  Administered 2016-08-25 – 2016-08-27 (×3): 15 mg via ORAL
  Filled 2016-08-25 (×3): qty 1

## 2016-08-25 MED ORDER — SODIUM CHLORIDE 0.9% FLUSH
10.0000 mL | Freq: Two times a day (BID) | INTRAVENOUS | Status: DC
  Administered 2016-08-25: 20 mL
  Administered 2016-08-25 – 2016-09-01 (×11): 10 mL
  Administered 2016-09-02: 30 mL

## 2016-08-25 MED ORDER — FUROSEMIDE 10 MG/ML IJ SOLN
100.0000 mg | Freq: Two times a day (BID) | INTRAVENOUS | Status: DC
  Administered 2016-08-25: 100 mg via INTRAVENOUS
  Filled 2016-08-25 (×2): qty 10

## 2016-08-25 MED ORDER — AMIODARONE HCL 200 MG PO TABS
400.0000 mg | ORAL_TABLET | Freq: Two times a day (BID) | ORAL | Status: DC
  Administered 2016-08-25 (×2): 400 mg via ORAL
  Filled 2016-08-25 (×2): qty 2

## 2016-08-25 MED ORDER — FUROSEMIDE 10 MG/ML IJ SOLN
80.0000 mg | Freq: Once | INTRAMUSCULAR | Status: AC
  Administered 2016-08-25: 80 mg via INTRAVENOUS
  Filled 2016-08-25: qty 8

## 2016-08-25 MED ORDER — LORAZEPAM 0.5 MG PO TABS
0.5000 mg | ORAL_TABLET | Freq: Four times a day (QID) | ORAL | Status: DC | PRN
  Administered 2016-08-25 – 2016-09-02 (×9): 0.5 mg via ORAL
  Filled 2016-08-25 (×9): qty 1

## 2016-08-25 NOTE — Progress Notes (Signed)
Inpatient Diabetes Program Recommendations  AACE/ADA: New Consensus Statement on Inpatient Glycemic Control (2015)  Target Ranges:  Prepandial:   less than 140 mg/dL      Peak postprandial:   less than 180 mg/dL (1-2 hours)      Critically ill patients:  140 - 180 mg/dL  Results for AUDRA, BELLARD (MRN [id number]) as of 08/25/2016 13:41  Ref. Range 08/24/2016 08:22 08/24/2016 12:16 08/24/2016 17:19 08/24/2016 22:13 08/25/2016 08:16 08/25/2016 11:39  Glucose-Capillary Latest Ref Range: 65 - 99 mg/dL 865 (H) 784 (H) 696 (H) 245 (H) 153 (H) 327 (H)   Review of Glycemic Control  Diabetes history: DM 2 Outpatient Diabetes medications: Glyburide-Metformin 5-500 mg Daily, Lantus 10-40 units CBG = 200 25-30 units, CBG >250 40 units Current orders for Inpatient glycemic control: Novolog Moderate TID  Inpatient Diabetes Program Recommendations:   Hyperglycemia above inpatient goal. Please consider Lantus 8-10 units QHS and add bedtime Novolog scale.  Thanks,  Christena Deem RN, MSN, [facility] Inpatient Diabetes Coordinator Team Pager [phone] (8a-5p)

## 2016-08-25 NOTE — Progress Notes (Addendum)
Patient Name: [name] Date of Encounter: 08/25/2016  Primary Cardiologist: Dr. Winfield Rast. [facility] Problem List     Active Problems:   Chronic systolic heart failure [facility])   Ventricular tachycardia-treated the ATP   Acute on chronic combined systolic and diastolic congestive heart failure (HCC)   Chronic renal failure in pediatric patient, stage 3 (moderate)   Hypotension   Cardiogenic shock (HCC)   Acute renal failure (HCC)     Subjective   Feeling well. Notes that his breathing has improved  Inpatient Medications    Scheduled Meds: . aspirin EC  81 mg Oral Daily  . atorvastatin  10 mg Oral Daily  . docusate sodium  100 mg Oral Daily  . furosemide  60 mg Intravenous BID  . insulin aspart  0-15 Units Subcutaneous TID WC  . pantoprazole  80 mg Oral Q1200  . sodium chloride flush  10-40 mL Intracatheter Q12H   Continuous Infusions: . amiodarone 30 mg/hr (08/25/16 0800)  . heparin 1,300 Units/hr (08/25/16 0800)  . milrinone 0.0625 mcg/kg/min (08/25/16 0800)  . norepinephrine (LEVOPHED) Adult infusion 7 mcg/min (08/25/16 0912)   PRN Meds: promethazine, sodium chloride flush   Vital Signs    Vitals:   08/25/16 0600 08/25/16 0700 08/25/16 0745 08/25/16 0800  BP: (!) 90/53 109/68 114/86 115/79  Pulse: (!) 104 (!) 126 (!) 56 94  Resp: 15 18 18 14   Temp:    97.7 F (36.5 C)  TempSrc:      SpO2: (!) 30% 93% 92% 100%  Weight:      Height:        Intake/Output Summary (Last 24 hours) at 08/25/16 0913 Last data filed at 08/25/16 0912  Gross per 24 hour  Intake          1395.68 ml  Output              400 ml  Net           995.68 ml   Filed Weights   08/23/16 1359 08/24/16 0537 08/25/16 0400  Weight: 95.7 kg (211 lb) 90 kg (198 lb 6.4 oz) 100.3 kg (221 lb 1.9 oz)    Physical Exam    GEN: Well nourished, well developed, in no acute distress.  HEENT: Grossly normal.  Neck: Supple, JVP4cm above clavicle at 45 degree.  No carotid bruits, or  masses. Cardiac: Irregularly irregular.  III/VI holosystolic murmur at the apex.  No rubs, or gallops. No clubbing, cyanosis.  2+ pitting edema bilaterally.  Radials/DP/PT 2+ and equal bilaterally.  Respiratory:  Respirations regular and unlabored, clear to auscultation bilaterally. GI: Soft, nontender, nondistended, BS + x 4. MS: no deformity or atrophy. Skin: warm and dry, no rash. Neuro:  Strength and sensation are intact. Psych: AAOx3.  Normal affect.  Labs    CBC  Recent Labs  08/24/16 0516 08/25/16 0430  WBC 5.6 11.3*  HGB 10.8* 11.6*  HCT 31.2* 33.3*  MCV 79.6 78.4  PLT 147* 157   Basic Metabolic Panel  Recent Labs  08/23/16 2302  08/24/16 1900 08/25/16 0430  NA  --   < > 119* 119*  K  --   < > 4.3 4.1  CL  --   < > 81* 82*  CO2  --   < > 25 27  GLUCOSE  --   < > 217* 172*  BUN  --   < > 36* 39*  CREATININE  --   < > 2.19*  2.37*  CALCIUM  --   < > 9.0 9.2  MG 1.8  --   --   --   < > = values in this interval not displayed. Liver Function Tests  Recent Labs  08/24/16 1900  AST 1,010*  ALT 960*  ALKPHOS 306*  BILITOT 1.4*  PROT 6.0*  ALBUMIN 3.5   No results for input(s): LIPASE, AMYLASE in the last 72 hours. Cardiac Enzymes  Recent Labs  08/23/16 1749 08/23/16 2302 08/24/16 0516  TROPONINI 0.46* 0.70* 0.53*   BNP Invalid input(s): POCBNP D-Dimer No results for input(s): DDIMER in the last 72 hours. Hemoglobin A1C No results for input(s): HGBA1C in the last 72 hours. Fasting Lipid Panel  Recent Labs  08/25/16 0430  CHOL 71  HDL 33*  LDLCALC 32  TRIG 28  CHOLHDL 2.2   Thyroid Function Tests No results for input(s): TSH, T4TOTAL, T3FREE, THYROIDAB in the last 72 hours.  Invalid input(s): FREET3  Telemetry    Wandering atrial pacemaker.  Frequent PACs and PVCs - Personally Reviewed  ECG    n/a - Personally Reviewed  Radiology    Dg Chest Port 1 View  Result Date: 08/24/2016 CLINICAL DATA:  82 y/o  M; central line  placement. EXAM: PORTABLE CHEST 1 VIEW COMPARISON:  08/23/2016 chest radiograph FINDINGS: Stable cardiomegaly. Stable configuration of pacemaker. Left central venous catheter tip projects over the mid SVC. Clear lungs. No pneumothorax or pleural effusion. Post median sternotomy changes. IMPRESSION: No acute cardiopulmonary process. Stable cardiomegaly. Left central venous catheter tip projects over mid SVC. Electronically Signed   By: Mitzi Hansen M.D.   On: 08/24/2016 18:55   Dg Chest Portable 1 View  Result Date: 08/23/2016 CLINICAL DATA:  Hypotension.  Dizziness when standing. EXAM: PORTABLE CHEST 1 VIEW COMPARISON:  One-view chest x-ray 03/01/2010 FINDINGS: Cardiac enlargement is stable. Chronic interstitial coarsening is present without superimposed edema. AICD lead is stable. No focal airspace disease is present. Pacing wires are in place. Visualized soft tissues and bony thorax are unremarkable. IMPRESSION: 1. No acute cardiopulmonary disease. 2. Cardiomegaly without failure. Electronically Signed   By: Marin Roberts M.D.   On: 08/23/2016 15:49    Cardiac Studies   Echo 08/24/16: Study Conclusions  - Left ventricle: The cavity size was moderately dilated. There was   moderate concentric hypertrophy. Systolic function was severely   reduced. The estimated ejection fraction was 15%. There is   akinesis of the apicalinferoseptal myocardium. There is severe   hypokinesis of the entirelateral myocardium. There is akinesis of   the entireinferolateral and inferior myocardium. There was a   reduced contribution of atrial contraction to ventricular   filling, due to increased ventricular diastolic pressure or   atrial contractile dysfunction. Doppler parameters are consistent   with a reversible restrictive pattern, indicative of decreased   left ventricular diastolic compliance and/or increased left   atrial pressure (grade 3 diastolic dysfunction). Doppler   parameters are  consistent with high ventricular filling pressure. - Aortic valve: Trileaflet; moderately thickened, mildly calcified   leaflets. There was trivial regurgitation. - Mitral valve: Calcified annulus. There was mild to moderate   regurgitation. - Left atrium: The atrium was mildly dilated. - Tricuspid valve: There was moderate regurgitation. - Pulmonary arteries: PA peak pressure: 38 mm Hg (S). - Recommendations: Recommend limited study with defninty contrast   to completely rule out apical mural thrombus.  Impressions:  - COmpared to study of 2013, the LV function has significantly  declined with new wall motion abnormalities. The right   ventricular systolic pressure was increased consistent with mild   pulmonary hypertension.  Patient Profile     Mr. Brann is a 39M with CAD s/p CABG, hypertension, hyperlipidemia and chronic systolic and diastolic heart failure (LVEF 45-50%) admitted with hypotension thought to be due toover diuresis and taking lasix and torsemide at the same time.  Repeat echo revealed LVEF 15% with grade 3 diastolic dsyfunction.  Transferred to cardiac ICU 08/24/16 due to cardiogenic shock.  Assessment & Plan    # Cardiogenic shock: # Acute on chronic systolic and diastolic heart failure: LVEF 15% this admission, down from 45-50% prior.  He will need an ischemia evaluation when stable.  Mr. Hovater was transferred to the ICU yesterday because he appeared cold and wet.  He also became aneuric.  He was started on milrinone and levophed with improvement in his BP.  He has started making urine with IV lasix but remains olguric and was net +1L yesterday.  CVP is 15.  Central venous O2 sat was 50 yesterday when pressors were started and remains 50 this am.  MAPs this am are in the 90s so we will wean Levophed.  Continue milrinone.  Increase lasix to 100mg  q12h.  We will ask the heart failure service to see him as well.  # Transaminitis: Likely due to shocked liver.  Liver  ultrasound was just completed.  We will need to stop amiodarone if it does not resolve.  # Acute on chronic renal failure:  Baseline creatinine is around 1.4-1.5.  Today it is up to 2.37.  This is likely due to cardiogenic shock and poor forward flow.  He has also struggled with urinary retention.  However the most he ever had on bladder scan prior to foley catheterization was 800 mL, so this did not cause his renal failure.  Increase lasix as above and continue to monitor.  # Wandering atrial pacemaker/MAT: # Atrial fibrillation:  Mr. Arterberry' heart rhythm is very irregular, but it mostly appears to be a wandering pacemaker and MAT.  Yesterday he had an episode that was more concerning for atrial fibrillation/flutter.  Therefore heparin was started.  We will stop IV amiodarone and start oral 400mg  bid.  Not sure that he will need this long term.   # Anxiety: Mr. Steck reports anxiety and usually takes Benadryl at home. We will avoid this given his urinary retention.  Lorazepam 0.5mg  q12h prn.   Time spent: 60 minutes-Greater than 50% of this time was spent in counseling, explanation of diagnosis, planning of further management, and coordination of care.   Signed, Chilton Si, MD  08/25/2016, 9:13 AM

## 2016-08-25 NOTE — Progress Notes (Signed)
Discussed patient with Dr. Duke Salvia.  Increased milrinone to 0.25 and decreased norepinephrine to 3.  Got dose of Samsca.   He is improved with much better UOP.  Co-ox up to 68% and CVP down to 8-9.  Will only give Lasix 80 mg IV x 1 this evening then hold further Lasix until re-evaluation in am.  Needs repeat BMET this evening for Na and K.   He is now in NSR on amiodarone.   Will get limited echo with Definity for LV thrombus.   Marca Ancona 08/25/2016 4:47 PM

## 2016-08-25 NOTE — Progress Notes (Signed)
PA paged about pt's K of 3.2  Orders received.  Will continue to monitor closely and update as needed.

## 2016-08-25 NOTE — Progress Notes (Signed)
Pt. Blood glucose 48 at 2035.  8 oz of orange juice given per protocol.  Rechecked at 2130 and blood glucose was 107.  Will continue to monitor.

## 2016-08-25 NOTE — Progress Notes (Signed)
ANTICOAGULATION CONSULT NOTE  Pharmacy Consult for Heparin Indication: atrial fibrillation  No Known Allergies  Patient Measurements: Height: 5\' 10"  (177.8 cm) Weight: 198 lb 6.4 oz (90 kg) IBW/kg (Calculated) : 73 Heparin Dosing Weight: 90 kg  Vital Signs: Temp: 97.7 F (36.5 C) (01/10 0000) Temp Source: Oral (01/10 0000) BP: 111/77 (01/10 0000) Pulse Rate: 89 (01/10 0000)  Labs:  Recent Labs  08/23/16 1437 08/23/16 1749 08/23/16 2302 08/24/16 0516 08/24/16 1900 08/24/16 2255  HGB 11.8* 11.3*  --  10.8*  --   --   HCT 32.9* 31.7*  --  31.2*  --   --   PLT 168 160  --  147*  --   --   HEPARINUNFRC  --   --   --   --   --  0.32  CREATININE 1.56* 1.43*  --  1.37* 2.19*  --   TROPONINI  --  0.46* 0.70* 0.53*  --   --     Estimated Creatinine Clearance: 30.9 mL/min (by C-G formula based on SCr of 2.19 mg/dL (H)).    Assessment: 82 yo M  presented to ED with hypotension and tachycardia.  Found to be in afib and started on Amio infusion with return of NSR. Pharmacy dosing heparin and the initial heparin level is at goal   Goal of Therapy:  Heparin level 0.3-0.7 units/ml Monitor platelets by anticoagulation protocol: Yes   Plan:  No heparin changes needed Heparin level and CBC daily while on heparin  76, Pharm D 08/25/2016 12:49 AM

## 2016-08-25 NOTE — Progress Notes (Signed)
ANTICOAGULATION CONSULT NOTE  Pharmacy Consult for Heparin Indication: atrial fibrillation  No Known Allergies  Patient Measurements: Height: 5\' 10"  (177.8 cm) Weight: 221 lb 1.9 oz (100.3 kg) IBW/kg (Calculated) : 73 Heparin Dosing Weight: 90 kg  Vital Signs: Temp: 98.4 F (36.9 C) (01/10 0400) Temp Source: Oral (01/10 0400) BP: 90/53 (01/10 0600) Pulse Rate: 104 (01/10 0600)  Labs:  Recent Labs  08/23/16 1749 08/23/16 2302 08/24/16 0516 08/24/16 1900 08/24/16 2255 08/25/16 0430 08/25/16 0431  HGB 11.3*  --  10.8*  --   --  11.6*  --   HCT 31.7*  --  31.2*  --   --  33.3*  --   PLT 160  --  147*  --   --  157  --   HEPARINUNFRC  --   --   --   --  0.32  --  0.44  CREATININE 1.43*  --  1.37* 2.19*  --  2.37*  --   TROPONINI 0.46* 0.70* 0.53*  --   --   --   --     Estimated Creatinine Clearance: 30 mL/min (by C-G formula based on SCr of 2.37 mg/dL (H)).    Assessment: 82 yo M  presented to ED with hypotension and tachycardia. Found to be in afib and started on amio infusion with return of NSR. Pharmacy dosing heparin, level remains therapeutic at 0.44. CBC stable, no bleeding noted.  Goal of Therapy:  Heparin level 0.3-0.7 units/ml Monitor platelets by anticoagulation protocol: Yes   Plan:  Continue heparin gtt at 1300 units/hr Daily heparin level, CBC Monitor s/sx of bleeding   76, PharmD PGY1 Pharmacy Resident Pager: [phone] 08/25/2016 6:31 AM

## 2016-08-26 ENCOUNTER — Inpatient Hospital Stay (HOSPITAL_COMMUNITY): Payer: Medicare Other

## 2016-08-26 DIAGNOSIS — I509 Heart failure, unspecified: Secondary | ICD-10-CM

## 2016-08-26 LAB — CBC
HCT: 30.6 % — ABNORMAL LOW (ref 39.0–52.0)
HEMOGLOBIN: 10.9 g/dL — AB (ref 13.0–17.0)
MCH: 27.7 pg (ref 26.0–34.0)
MCHC: 35.6 g/dL (ref 30.0–36.0)
MCV: 77.9 fL — AB (ref 78.0–100.0)
PLATELETS: 115 10*3/uL — AB (ref 150–400)
RBC: 3.93 MIL/uL — ABNORMAL LOW (ref 4.22–5.81)
RDW: 13.3 % (ref 11.5–15.5)
WBC: 7.7 10*3/uL (ref 4.0–10.5)

## 2016-08-26 LAB — BASIC METABOLIC PANEL
ANION GAP: 13 (ref 5–15)
BUN: 37 mg/dL — ABNORMAL HIGH (ref 6–20)
CHLORIDE: 82 mmol/L — AB (ref 101–111)
CO2: 28 mmol/L (ref 22–32)
Calcium: 8.9 mg/dL (ref 8.9–10.3)
Creatinine, Ser: 2.12 mg/dL — ABNORMAL HIGH (ref 0.61–1.24)
GFR calc Af Amer: 32 mL/min — ABNORMAL LOW (ref >60)
GFR calc non Af Amer: 28 mL/min — ABNORMAL LOW (ref >60)
GLUCOSE: 249 mg/dL — AB (ref 65–99)
POTASSIUM: 4 mmol/L (ref 3.5–5.1)
Sodium: 123 mmol/L — ABNORMAL LOW (ref 135–145)

## 2016-08-26 LAB — ECHOCARDIOGRAM LIMITED
Ao-asc: 27 cm
FS: 3 % — AB (ref 28–44)
Height: 70 in
IVS/LV PW RATIO, ED: 1.32
LA ID, A-P, ES: 50 mm
LA diam end sys: 50 mm
LA diam index: 2.35 cm/m2
PW: 11.6 mm — AB (ref 0.6–1.1)
WEIGHTICAEL: 3340.41 [oz_av]

## 2016-08-26 LAB — COMPREHENSIVE METABOLIC PANEL
ALT: 1538 U/L — ABNORMAL HIGH (ref 17–63)
AST: 971 U/L — AB (ref 15–41)
Albumin: 3.3 g/dL — ABNORMAL LOW (ref 3.5–5.0)
Alkaline Phosphatase: 243 U/L — ABNORMAL HIGH (ref 38–126)
Anion gap: 13 (ref 5–15)
BUN: 38 mg/dL — AB (ref 6–20)
CHLORIDE: 84 mmol/L — AB (ref 101–111)
CO2: 29 mmol/L (ref 22–32)
Calcium: 8.9 mg/dL (ref 8.9–10.3)
Creatinine, Ser: 2.24 mg/dL — ABNORMAL HIGH (ref 0.61–1.24)
GFR calc Af Amer: 30 mL/min — ABNORMAL LOW (ref >60)
GFR, EST NON AFRICAN AMERICAN: 26 mL/min — AB (ref >60)
Glucose, Bld: 109 mg/dL — ABNORMAL HIGH (ref 65–99)
Potassium: 3.5 mmol/L (ref 3.5–5.1)
SODIUM: 126 mmol/L — AB (ref 135–145)
Total Bilirubin: 1.3 mg/dL — ABNORMAL HIGH (ref 0.3–1.2)
Total Protein: 5.8 g/dL — ABNORMAL LOW (ref 6.5–8.1)

## 2016-08-26 LAB — SODIUM: Sodium: 124 mmol/L — ABNORMAL LOW (ref 135–145)

## 2016-08-26 LAB — COOXEMETRY PANEL
CARBOXYHEMOGLOBIN: 1.1 % (ref 0.5–1.5)
CARBOXYHEMOGLOBIN: 1 % (ref 0.5–1.5)
METHEMOGLOBIN: 0.9 % (ref 0.0–1.5)
METHEMOGLOBIN: 1.2 % (ref 0.0–1.5)
O2 Saturation: 59.1 %
O2 Saturation: 58 %
Total hemoglobin: 11.1 g/dL — ABNORMAL LOW (ref 12.0–16.0)
Total hemoglobin: 11.3 g/dL — ABNORMAL LOW (ref 12.0–16.0)

## 2016-08-26 LAB — GLUCOSE, CAPILLARY
GLUCOSE-CAPILLARY: 123 mg/dL — AB (ref 65–99)
GLUCOSE-CAPILLARY: 234 mg/dL — AB (ref 65–99)
Glucose-Capillary: 90 mg/dL (ref 65–99)
Glucose-Capillary: 228 mg/dL — ABNORMAL HIGH (ref 65–99)
Glucose-Capillary: 244 mg/dL — ABNORMAL HIGH (ref 65–99)

## 2016-08-26 LAB — HEPARIN LEVEL (UNFRACTIONATED): Heparin Unfractionated: 0.68 IU/mL (ref 0.30–0.70)

## 2016-08-26 MED ORDER — POTASSIUM CHLORIDE CRYS ER 20 MEQ PO TBCR
40.0000 meq | EXTENDED_RELEASE_TABLET | Freq: Two times a day (BID) | ORAL | Status: DC
  Administered 2016-08-26 – 2016-08-27 (×4): 40 meq via ORAL
  Filled 2016-08-26 (×4): qty 2

## 2016-08-26 MED ORDER — PERFLUTREN LIPID MICROSPHERE
1.0000 mL | INTRAVENOUS | Status: AC | PRN
  Administered 2016-08-26: 2 mL via INTRAVENOUS
  Filled 2016-08-26 (×2): qty 10

## 2016-08-26 MED ORDER — AMIODARONE HCL IN DEXTROSE 360-4.14 MG/200ML-% IV SOLN
60.0000 mg/h | INTRAVENOUS | Status: AC
  Administered 2016-08-26: 60 mg/h via INTRAVENOUS
  Filled 2016-08-26: qty 200

## 2016-08-26 MED ORDER — POLYETHYLENE GLYCOL 3350 17 G PO PACK
17.0000 g | PACK | Freq: Every day | ORAL | Status: DC
  Administered 2016-08-26 – 2016-09-01 (×7): 17 g via ORAL
  Filled 2016-08-26 (×7): qty 1

## 2016-08-26 MED ORDER — DIGOXIN 125 MCG PO TABS
0.1250 mg | ORAL_TABLET | Freq: Every day | ORAL | Status: DC
  Administered 2016-08-26 – 2016-09-02 (×8): 0.125 mg via ORAL
  Filled 2016-08-26 (×8): qty 1

## 2016-08-26 MED ORDER — MILRINONE LACTATE IN DEXTROSE 20-5 MG/100ML-% IV SOLN
0.2500 ug/kg/min | INTRAVENOUS | Status: DC
  Administered 2016-08-26 – 2016-08-30 (×7): 0.25 ug/kg/min via INTRAVENOUS
  Filled 2016-08-26 (×8): qty 100

## 2016-08-26 MED ORDER — FUROSEMIDE 10 MG/ML IJ SOLN
80.0000 mg | Freq: Two times a day (BID) | INTRAMUSCULAR | Status: DC
  Administered 2016-08-26 (×2): 80 mg via INTRAVENOUS
  Filled 2016-08-26 (×2): qty 8

## 2016-08-26 MED ORDER — BISACODYL 5 MG PO TBEC
5.0000 mg | DELAYED_RELEASE_TABLET | Freq: Every day | ORAL | Status: DC | PRN
  Administered 2016-08-26: 5 mg via ORAL
  Filled 2016-08-26: qty 1

## 2016-08-26 MED ORDER — AMIODARONE HCL IN DEXTROSE 360-4.14 MG/200ML-% IV SOLN
30.0000 mg/h | INTRAVENOUS | Status: DC
  Administered 2016-08-26 – 2016-08-31 (×11): 30 mg/h via INTRAVENOUS
  Filled 2016-08-26 (×10): qty 200

## 2016-08-26 NOTE — Progress Notes (Signed)
ANTICOAGULATION CONSULT NOTE  Pharmacy Consult for Heparin Indication: atrial fibrillation  No Known Allergies  Patient Measurements: Height: 5\' 10"  (177.8 cm) Weight: 208 lb 12.4 oz (94.7 kg) IBW/kg (Calculated) : 73 Heparin Dosing Weight: 90 kg  Vital Signs: Temp: 97.9 F (36.6 C) (01/11 1130) Temp Source: Oral (01/11 1130) BP: 96/56 (01/11 1130) Pulse Rate: 49 (01/11 0700)  Labs:  Recent Labs  08/23/16 1749 08/23/16 2302 08/24/16 0516  08/24/16 2255 08/25/16 0430 08/25/16 0431 08/25/16 1803 08/26/16 0430 08/26/16 0500  HGB 11.3*  --  10.8*  --   --  11.6*  --   --  10.9*  --   HCT 31.7*  --  31.2*  --   --  33.3*  --   --  30.6*  --   PLT 160  --  147*  --   --  157  --   --  115*  --   HEPARINUNFRC  --   --   --   --  0.32  --  0.44  --   --  0.68  CREATININE 1.43*  --  1.37*  < >  --  2.37*  --  2.20* 2.24*  --   TROPONINI 0.46* 0.70* 0.53*  --   --   --   --   --   --   --   < > = values in this interval not displayed.  Estimated Creatinine Clearance: 30.9 mL/min (by C-G formula based on SCr of 2.24 mg/dL (H)).    Assessment: 82 yo M  presented to ED with hypotension and tachycardia. Found to be in afib and started on amio infusion with return of NSR. Pharmacy dosing heparin, level remains therapeutic at 0.68. CBC stable, no bleeding noted.  Goal of Therapy:  Heparin level 0.3-0.7 units/ml Monitor platelets by anticoagulation protocol: Yes   Plan:  Continue heparin gtt at 1300 units/hr Daily heparin level, CBC Monitor s/sx of bleeding  76 PharmD., BCPS Clinical Pharmacist Pager [phone] 08/26/2016 12:25 PM

## 2016-08-26 NOTE — Progress Notes (Signed)
Inpatient Diabetes Program Recommendations  AACE/ADA: New Consensus Statement on Inpatient Glycemic Control (2015)  Target Ranges:  Prepandial:   less than 140 mg/dL      Peak postprandial:   less than 180 mg/dL (1-2 hours)      Critically ill patients:  140 - 180 mg/dL   Lab Results  Component Value Date   GLUCAP 228 (H) 08/26/2016    Review of Glycemic Control Results for [name], [name] (MRN [id number]) as of 08/26/2016 12:54  Ref. Range 08/25/2016 20:35 08/25/2016 21:30 08/26/2016 04:29 08/26/2016 08:35 08/26/2016 11:57  Glucose-Capillary Latest Ref Range: 65 - 99 mg/dL 48 (L) 449 (H) 90 201 (H) 228 (H)   Inpatient Diabetes Program Recommendations:  Noted hypoglycemia post moderate correction.  Please consider: -Lantus 10 units daily -Decrease Novolog correction to sensitive 0-9 units tid + 0-5 units hs  Thank you, Darel Hong E. Kailand Seda, RN, MSN, CDE Inpatient Glycemic Control Team Team Pager [phone] (8am-5pm) 08/26/2016 12:56 PM

## 2016-08-26 NOTE — Progress Notes (Signed)
  Echocardiogram 2D Echocardiogram limited with definity has been performed.  Janalyn Harder 08/26/2016, 3:24 PM

## 2016-08-26 NOTE — Progress Notes (Addendum)
Patient ID: [name], male   DOB: Nov 10, 1934, 82 y.o.   MRN: [id number]   SUBJECTIVE: Patient admitted with acute on chronic systolic CHF.  Developed cardiogenic shock on 1/9 with significant hypotension requiring initiation of norepinephrine.  Poor UOP 1/10 am with rising creatinine and low co-ox.  Milrinone increased to 0.25 and continued on low dose norepinephrine.  Improved co-ox with very good UOP during the afternoon/overnight.  Tolvaptan begun for hyponatremia.   This morning, he is awake and alert, says he is breathing better.  SBP 90s-100s on norepinephrine 2 + milrinone 0.25.  Co-ox 59% this morning.  CVP 13-14.  Creatinine stable at 2.24.   He is in atrial fibrillation with rate 100s-110s, on amiodarone po.    LFTs elevated, abdominal US showed echogenic liver consistent with fatty liver disease versus hepatocellular disease.   Echo: EF 15%, moderate LVH, mild-moderate MR, cannot rule out thrombus.   Scheduled Meds: . amiodarone  400 mg Oral BID  . aspirin EC  81 mg Oral Daily  . atorvastatin  10 mg Oral Daily  . digoxin  0.125 mg Oral Daily  . docusate sodium  100 mg Oral Daily  . furosemide  80 mg Intravenous BID  . insulin aspart  0-15 Units Subcutaneous TID WC  . pantoprazole  80 mg Oral Q1200  . potassium chloride  40 mEq Oral BID  . sodium chloride flush  10-40 mL Intracatheter Q12H  . tolvaptan  15 mg Oral Q24H   Continuous Infusions: . heparin 1,300 Units/hr (08/25/16 2139)  . milrinone 0.25 mcg/kg/min (08/26/16 0346)  . norepinephrine (LEVOPHED) Adult infusion 3 mcg/min (08/26/16 0551)   PRN Meds:.LORazepam, promethazine, sodium chloride flush    Vitals:   08/26/16 0400 08/26/16 0500 08/26/16 0600 08/26/16 0700  BP: (!) 98/52 (!) 84/49 (!) 95/51 (!) 109/58  Pulse: (!) 103 (!) 35 (!) 50 (!) 49  Resp: 19 16 16 15   Temp: 97.5 F (36.4 C)     TempSrc: Oral     SpO2: 100% 98% 97% 100%  Weight: 208 lb 12.4 oz (94.7 kg)     Height:        Intake/Output  Summary (Last 24 hours) at 08/26/16 0737 Last data filed at 08/26/16 0500  Gross per 24 hour  Intake          1156.01 ml  Output             6325 ml  Net         -5168.99 ml    LABS: Basic Metabolic Panel:  Recent Labs  40/98/11 2302  08/25/16 1803 08/26/16 0120 08/26/16 0430  NA  --   < > 121* 124* 126*  K  --   < > 3.2*  --  3.5  CL  --   < > 81*  --  84*  CO2  --   < > 29  --  29  GLUCOSE  --   < > 52*  --  109*  BUN  --   < > 39*  --  38*  CREATININE  --   < > 2.20*  --  2.24*  CALCIUM  --   < > 9.0  --  8.9  MG 1.8  --   --   --   --   < > = values in this interval not displayed. Liver Function Tests:  Recent Labs  08/24/16 1900 08/26/16 0430  AST 1,010* 971*  ALT 960* 1,538*  ALKPHOS  306* 243*  BILITOT 1.4* 1.3*  PROT 6.0* 5.8*  ALBUMIN 3.5 3.3*   No results for input(s): LIPASE, AMYLASE in the last 72 hours. CBC:  Recent Labs  08/25/16 0430 08/26/16 0430  WBC 11.3* 7.7  HGB 11.6* 10.9*  HCT 33.3* 30.6*  MCV 78.4 77.9*  PLT 157 115*   Cardiac Enzymes:  Recent Labs  08/23/16 1749 08/23/16 2302 08/24/16 0516  TROPONINI 0.46* 0.70* 0.53*   BNP: Invalid input(s): POCBNP D-Dimer: No results for input(s): DDIMER in the last 72 hours. Hemoglobin A1C: No results for input(s): HGBA1C in the last 72 hours. Fasting Lipid Panel:  Recent Labs  08/25/16 0430  CHOL 71  HDL 33*  LDLCALC 32  TRIG 28  CHOLHDL 2.2   Thyroid Function Tests: No results for input(s): TSH, T4TOTAL, T3FREE, THYROIDAB in the last 72 hours.  Invalid input(s): FREET3 Anemia Panel: No results for input(s): VITAMINB12, FOLATE, FERRITIN, TIBC, IRON, RETICCTPCT in the last 72 hours.  RADIOLOGY: Dg Chest Port 1 View  Result Date: 08/24/2016 CLINICAL DATA:  82 y/o  M; central line placement. EXAM: PORTABLE CHEST 1 VIEW COMPARISON:  08/23/2016 chest radiograph FINDINGS: Stable cardiomegaly. Stable configuration of pacemaker. Left central venous catheter tip projects over  the mid SVC. Clear lungs. No pneumothorax or pleural effusion. Post median sternotomy changes. IMPRESSION: No acute cardiopulmonary process. Stable cardiomegaly. Left central venous catheter tip projects over mid SVC. Electronically Signed   By: Mitzi Hansen M.D.   On: 08/24/2016 18:55   Dg Chest Portable 1 View  Result Date: 08/23/2016 CLINICAL DATA:  Hypotension.  Dizziness when standing. EXAM: PORTABLE CHEST 1 VIEW COMPARISON:  One-view chest x-ray 03/01/2010 FINDINGS: Cardiac enlargement is stable. Chronic interstitial coarsening is present without superimposed edema. AICD lead is stable. No focal airspace disease is present. Pacing wires are in place. Visualized soft tissues and bony thorax are unremarkable. IMPRESSION: 1. No acute cardiopulmonary disease. 2. Cardiomegaly without failure. Electronically Signed   By: Marin Roberts M.D.   On: 08/23/2016 15:49   US Abdomen Limited Ruq  Result Date: 08/25/2016 CLINICAL DATA:  Elevated liver function tests. EXAM: US ABDOMEN LIMITED - RIGHT UPPER QUADRANT COMPARISON:  CT 03/01/2010. FINDINGS: Gallbladder: Cholecystectomy. Common bile duct: Diameter: 3.0 mm Liver: There is echogenic consistent with fatty infiltration and/or hepatocellular disease. No focal hepatic abnormality identified. Ascites and right pleural effusion noted. IMPRESSION: 1. Cholecystectomy.  No biliary distention. 2. Liver is echogenic consistent fatty infiltration and/or hepatocellular disease. No focal hepatic abnormality identified. 3. Ascites and right pleural effusion noted. Electronically Signed   By: Maisie Fus  Register   On: 08/25/2016 09:19    PHYSICAL EXAM General: NAD Neck: JVP 12+ cm, no thyromegaly or thyroid nodule.  Lungs: Crackles at bases bilaterally. CV: Lateral PMI.  Heart mildly tachy, irregular S1/S2, no S3/S4, no murmur.  1+ edema 1/2 to knees bilaterally.   Abdomen: Soft, nontender, no hepatosplenomegaly, no distention.  Neurologic: Alert and  oriented x 3.  Psych: Normal affect. Extremities: No clubbing or cyanosis.   TELEMETRY: Reviewed telemetry pt in atrial fibrillation 100s-110s  ASSESSMENT AND PLAN: 82 yo with history of CAD s/p CABG, ischemic cardiomyopathy, CKD stage III, HTN, DM was admitted with acute on chronic systolic CHF with cardiogenic shock (hypotensive/tachycardic).  1. Cardiogenic shock/acute on chronic systolic CHF: Echo showed EF down to 15% from 45-50% on last echo in 2013.  Ischemic cardiomyopathy.  Has ICD.  Marked hypotension on 1/9, start on norepinephrine and now on milrinone. Co-ox improved with  increased milrinone, 68% last night and 59% this morning.  SBP stable in 90s-110s on norepinephrine 2.  CVP 13-14 with very good diuresis yesterday.  - Continue milrinone 0.25 + norepinephrine 2.  Can titrate down slowly on norepinephrine.  Repeat co-ox in afternoon.  - Can add digoxin for inotropy and to help with rate control as long as creatinine remains stable.  - Continue to diurese => will use Lasix 80 mg IV bid and will get another dose of tolvaptan.  - Given age, unlikely to be candidate for advanced therapies.  - Given fall in EF, ideally would have coronary angiography but will have to hold off for now with rise in creatinine.  2. Hyponatremia: As low as 119.  Suspect hypervolemic hyponatremia in setting of CHF.  Na up to 126 today.  - Will give another dose of tolvaptan today and keep fluid restricted.  3. AKI on CKD stage III: Baseline creatinine had been around 1.4, worse recently.  Up to 2.3 since admission, 2.24 today.  Follow closely, suspect cardiorenal.  4. CAD: s/p CABG.  EF has fallen considerably compared to last echo in 2013.  No recent chest pain.  Troponin mildly and stably elevated at admission, suspect demand ischemia with volume overload rather than ACS.  Concerned about progression of CAD however.  No coronary angiography yet with no ACS and elevated creatinine.  - Continue ASA, holding  statin for now with spike in LFTs (restart when start to trend down).  5. Atrial fibrillation: New this admission. He is currently in atrial fibrillation with mild RVR and on po amiodarone (he was initially on amiodarone IV).  - Continue heparin gtt.  - Would give IV amiodarone for rate control for now.  6. Elevated LFTs: Marked rise in LFTs.  Abdominal US as above.  Suspect shock liver given cardiogenic shock with hypotension initially.   - Follow daily.  - Holding statin.  - Will need to use amiodarone for now for rate control.  7. ?LV thrombus: Repeat limited echo with Definity.   45 minutes critical care time.   Marca Ancona 08/26/2016 7:54 AM

## 2016-08-27 ENCOUNTER — Inpatient Hospital Stay (HOSPITAL_COMMUNITY): Payer: Medicare Other

## 2016-08-27 LAB — URINALYSIS, ROUTINE W REFLEX MICROSCOPIC
Bilirubin Urine: NEGATIVE
GLUCOSE, UA: NEGATIVE mg/dL
KETONES UR: NEGATIVE mg/dL
Leukocytes, UA: NEGATIVE
Nitrite: NEGATIVE
PROTEIN: NEGATIVE mg/dL
Specific Gravity, Urine: <1.005 — ABNORMAL LOW (ref 1.005–1.030)
pH: 6 (ref 5.0–8.0)

## 2016-08-27 LAB — COMPREHENSIVE METABOLIC PANEL
ALBUMIN: 3.4 g/dL — AB (ref 3.5–5.0)
ALT: 1383 U/L — ABNORMAL HIGH (ref 17–63)
ANION GAP: 13 (ref 5–15)
AST: 611 U/L — ABNORMAL HIGH (ref 15–41)
Alkaline Phosphatase: 221 U/L — ABNORMAL HIGH (ref 38–126)
BUN: 32 mg/dL — AB (ref 6–20)
CHLORIDE: 85 mmol/L — AB (ref 101–111)
CO2: 29 mmol/L (ref 22–32)
Calcium: 8.8 mg/dL — ABNORMAL LOW (ref 8.9–10.3)
Creatinine, Ser: 2.07 mg/dL — ABNORMAL HIGH (ref 0.61–1.24)
GFR calc Af Amer: 33 mL/min — ABNORMAL LOW (ref >60)
GFR, EST NON AFRICAN AMERICAN: 29 mL/min — AB (ref >60)
Glucose, Bld: 196 mg/dL — ABNORMAL HIGH (ref 65–99)
POTASSIUM: 3.9 mmol/L (ref 3.5–5.1)
Sodium: 127 mmol/L — ABNORMAL LOW (ref 135–145)
Total Bilirubin: 1.4 mg/dL — ABNORMAL HIGH (ref 0.3–1.2)
Total Protein: 5.9 g/dL — ABNORMAL LOW (ref 6.5–8.1)

## 2016-08-27 LAB — CBC
HCT: 29.7 % — ABNORMAL LOW (ref 39.0–52.0)
HCT: 29.7 % — ABNORMAL LOW (ref 39.0–52.0)
Hemoglobin: 10.4 g/dL — ABNORMAL LOW (ref 13.0–17.0)
Hemoglobin: 10.2 g/dL — ABNORMAL LOW (ref 13.0–17.0)
MCH: 27.7 pg (ref 26.0–34.0)
MCH: 27.6 pg (ref 26.0–34.0)
MCHC: 35 g/dL (ref 30.0–36.0)
MCHC: 34.3 g/dL (ref 30.0–36.0)
MCV: 79.2 fL (ref 78.0–100.0)
MCV: 80.5 fL (ref 78.0–100.0)
PLATELETS: 109 10*3/uL — AB (ref 150–400)
PLATELETS: 118 10*3/uL — AB (ref 150–400)
RBC: 3.75 MIL/uL — ABNORMAL LOW (ref 4.22–5.81)
RBC: 3.69 MIL/uL — ABNORMAL LOW (ref 4.22–5.81)
RDW: 13.7 % (ref 11.5–15.5)
RDW: 13.9 % (ref 11.5–15.5)
WBC: 6.5 10*3/uL (ref 4.0–10.5)
WBC: 5.9 10*3/uL (ref 4.0–10.5)

## 2016-08-27 LAB — COOXEMETRY PANEL
Carboxyhemoglobin: 1.4 % (ref 0.5–1.5)
Methemoglobin: 1 % (ref 0.0–1.5)
O2 SAT: 67 %
TOTAL HEMOGLOBIN: 10.7 g/dL — AB (ref 12.0–16.0)

## 2016-08-27 LAB — GLUCOSE, CAPILLARY
Glucose-Capillary: 184 mg/dL — ABNORMAL HIGH (ref 65–99)
Glucose-Capillary: 250 mg/dL — ABNORMAL HIGH (ref 65–99)
Glucose-Capillary: 226 mg/dL — ABNORMAL HIGH (ref 65–99)
Glucose-Capillary: 184 mg/dL — ABNORMAL HIGH (ref 65–99)

## 2016-08-27 LAB — URINALYSIS, MICROSCOPIC (REFLEX): Squamous Epithelial / LPF: NONE SEEN

## 2016-08-27 LAB — MAGNESIUM: MAGNESIUM: 1.6 mg/dL — AB (ref 1.7–2.4)

## 2016-08-27 LAB — HEPARIN LEVEL (UNFRACTIONATED): HEPARIN UNFRACTIONATED: 0.63 [IU]/mL (ref 0.30–0.70)

## 2016-08-27 MED ORDER — FLEET ENEMA 7-19 GM/118ML RE ENEM
1.0000 | ENEMA | Freq: Once | RECTAL | Status: AC
  Administered 2016-08-27: 1 via RECTAL
  Filled 2016-08-27: qty 1

## 2016-08-27 MED ORDER — BISACODYL 10 MG RE SUPP
10.0000 mg | Freq: Once | RECTAL | Status: AC
  Administered 2016-08-27: 10 mg via RECTAL
  Filled 2016-08-27: qty 1

## 2016-08-27 MED ORDER — INFLUENZA VAC SPLIT QUAD 0.5 ML IM SUSY
0.5000 mL | PREFILLED_SYRINGE | Freq: Once | INTRAMUSCULAR | Status: AC
  Administered 2016-08-31: 0.5 mL via INTRAMUSCULAR
  Filled 2016-08-27: qty 0.5

## 2016-08-27 MED ORDER — HEPARIN (PORCINE) IN NACL 100-0.45 UNIT/ML-% IJ SOLN
1200.0000 [IU]/h | INTRAMUSCULAR | Status: DC
  Administered 2016-08-27 – 2016-08-28 (×2): 1200 [IU]/h via INTRAVENOUS
  Filled 2016-08-27 (×3): qty 250

## 2016-08-27 NOTE — Progress Notes (Signed)
Patient ID: [name], male   DOB: 09-Mar-1935, 82 y.o.   MRN: [id number]   SUBJECTIVE: Patient admitted with acute on chronic systolic CHF.  Developed cardiogenic shock on 1/9 with significant hypotension requiring initiation of norepinephrine.  Poor UOP 1/10 am with rising creatinine and low co-ox.  Milrinone increased to 0.25 and continued on low dose norepinephrine.  Improved co-ox with very good UOP with med adjustment.  Tolvaptan begun for hyponatremia.   This morning, he is complaining of nausea and leg pain. Denies SOB. SBP 90s-100s on norepinephrine 1 mcg + milrinone 0.25.  Co-ox 67% this morning.  Negative 3.7 liters.  Weight down another 5 pounds.  Creatinine stable at 2.07. Sodium up to 127. CVP now 7.   He is back in NSR this am on amiodarone.      LFTs elevated but trending down, abdominal US showed echogenic liver consistent with fatty liver disease versus hepatocellular disease.   Echo: 08/24/2016 EF 15%, moderate LVH, mild-moderate MR, cannot rule out thrombus.  ECHO: 08/26/2016: EF 10-15% No evidence of thrombus RV mildly dilated.   Scheduled Meds: . aspirin EC  81 mg Oral Daily  . digoxin  0.125 mg Oral Daily  . docusate sodium  100 mg Oral Daily  . furosemide  80 mg Intravenous BID  . insulin aspart  0-15 Units Subcutaneous TID WC  . pantoprazole  80 mg Oral Q1200  . polyethylene glycol  17 g Oral Daily  . potassium chloride  40 mEq Oral BID  . sodium chloride flush  10-40 mL Intracatheter Q12H  . tolvaptan  15 mg Oral Q24H   Continuous Infusions: . amiodarone 30 mg/hr (08/27/16 0048)  . heparin 1,300 Units/hr (08/26/16 1957)  . milrinone 0.25 mcg/kg/min (08/27/16 0620)  . norepinephrine (LEVOPHED) Adult infusion 1 mcg/min (08/27/16 0615)   PRN Meds:.bisacodyl, LORazepam, promethazine, sodium chloride flush    Vitals:   08/27/16 0400 08/27/16 0500 08/27/16 0600 08/27/16 0700  BP: (!) 88/60 91/61 (!) 98/59 (!) 85/62  Pulse: 93 89 98 97  Resp: 18 20 19 14     Temp:      TempSrc:      SpO2: 99% 95% 97% 97%  Weight:      Height:        Intake/Output Summary (Last 24 hours) at 08/27/16 0705 Last data filed at 08/27/16 0700  Gross per 24 hour  Intake          1781.31 ml  Output             5510 ml  Net         -3728.69 ml    LABS: Basic Metabolic Panel:  Recent Labs  10/25/16 1630 08/27/16 0407  NA 123* 127*  K 4.0 3.9  CL 82* 85*  CO2 28 29  GLUCOSE 249* 196*  BUN 37* 32*  CREATININE 2.12* 2.07*  CALCIUM 8.9 8.8*  MG  --  1.6*   Liver Function Tests:  Recent Labs  08/26/16 0430 08/27/16 0407  AST 971* 611*  ALT 1,538* 1,383*  ALKPHOS 243* 221*  BILITOT 1.3* 1.4*  PROT 5.8* 5.9*  ALBUMIN 3.3* 3.4*   No results for input(s): LIPASE, AMYLASE in the last 72 hours. CBC:  Recent Labs  08/26/16 0430 08/27/16 0407  WBC 7.7 6.5  HGB 10.9* 10.4*  HCT 30.6* 29.7*  MCV 77.9* 79.2  PLT 115* 109*   Cardiac Enzymes: No results for input(s): CKTOTAL, CKMB, CKMBINDEX, TROPONINI in the last 72 hours.  BNP: Invalid input(s): POCBNP D-Dimer: No results for input(s): DDIMER in the last 72 hours. Hemoglobin A1C: No results for input(s): HGBA1C in the last 72 hours. Fasting Lipid Panel:  Recent Labs  08/25/16 0430  CHOL 71  HDL 33*  LDLCALC 32  TRIG 28  CHOLHDL 2.2   Thyroid Function Tests: No results for input(s): TSH, T4TOTAL, T3FREE, THYROIDAB in the last 72 hours.  Invalid input(s): FREET3 Anemia Panel: No results for input(s): VITAMINB12, FOLATE, FERRITIN, TIBC, IRON, RETICCTPCT in the last 72 hours.  RADIOLOGY: Dg Chest Port 1 View  Result Date: 08/24/2016 CLINICAL DATA:  82 y/o  M; central line placement. EXAM: PORTABLE CHEST 1 VIEW COMPARISON:  08/23/2016 chest radiograph FINDINGS: Stable cardiomegaly. Stable configuration of pacemaker. Left central venous catheter tip projects over the mid SVC. Clear lungs. No pneumothorax or pleural effusion. Post median sternotomy changes. IMPRESSION: No acute  cardiopulmonary process. Stable cardiomegaly. Left central venous catheter tip projects over mid SVC. Electronically Signed   By: Mitzi Hansen M.D.   On: 08/24/2016 18:55   Dg Chest Portable 1 View  Result Date: 08/23/2016 CLINICAL DATA:  Hypotension.  Dizziness when standing. EXAM: PORTABLE CHEST 1 VIEW COMPARISON:  One-view chest x-ray 03/01/2010 FINDINGS: Cardiac enlargement is stable. Chronic interstitial coarsening is present without superimposed edema. AICD lead is stable. No focal airspace disease is present. Pacing wires are in place. Visualized soft tissues and bony thorax are unremarkable. IMPRESSION: 1. No acute cardiopulmonary disease. 2. Cardiomegaly without failure. Electronically Signed   By: Marin Roberts M.D.   On: 08/23/2016 15:49   US Abdomen Limited Ruq  Result Date: 08/25/2016 CLINICAL DATA:  Elevated liver function tests. EXAM: US ABDOMEN LIMITED - RIGHT UPPER QUADRANT COMPARISON:  CT 03/01/2010. FINDINGS: Gallbladder: Cholecystectomy. Common bile duct: Diameter: 3.0 mm Liver: There is echogenic consistent with fatty infiltration and/or hepatocellular disease. No focal hepatic abnormality identified. Ascites and right pleural effusion noted. IMPRESSION: 1. Cholecystectomy.  No biliary distention. 2. Liver is echogenic consistent fatty infiltration and/or hepatocellular disease. No focal hepatic abnormality identified. 3. Ascites and right pleural effusion noted. Electronically Signed   By: Maisie Fus  Register   On: 08/25/2016 09:19    PHYSICAL EXAM CVP 6-7 General: NAD Neck: JVP 6-7 cm, no thyromegaly or thyroid nodule.  Lungs: Decreased in the bases.  CV: Lateral PMI.  Irregular S1/S2, no S3/S4, no murmur.  R and LLE trace edema.    Abdomen: Soft, nontender, no hepatosplenomegaly, no distention.  Neurologic: Alert and oriented x 3.  Psych: Normal affect. Extremities: No clubbing or cyanosis. Warm   TELEMETRY: A fib 90s with short burst to 140s  ASSESSMENT  AND PLAN: 82 yo with history of CAD s/p CABG, ischemic cardiomyopathy, CKD stage III, HTN, DM was admitted with acute on chronic systolic CHF with cardiogenic shock (hypotensive/tachycardic).  1. Cardiogenic shock/acute on chronic systolic CHF: Echo showed EF down to 15% from 45-50% on last echo in 2013.  Ischemic cardiomyopathy.  Has ICD.  Marked hypotension on 1/9, start on norepinephrine and now on milrinone. Co-ox improved with increased milrinone, 67% today.   - Continue milrinone 0.25 + norepinephrine 1 mcg.  Hopefully can titrate off norepinephrine today.   - Now on digoxin for inotropy and to help with rate control as long as creatinine remains stable.  - Stop IV lasix.  Hold diuretics today, probably begin po tomorrow.  - Given age, unlikely to be candidate for advanced therapies.  - Given fall in EF, ideally would have coronary  angiography but will have to hold off for now with rise in creatinine.  2. Hyponatremia: As low as 119.  Suspect hypervolemic hyponatremia in setting of CHF.  Na up to 127 today.  - Will give one more dose of tolvaptan today and keep fluid restricted.  3. AKI on CKD stage III: Baseline creatinine had been around 1.4, worse recently.  Up to 2.3 since admission, 2.07 today.  Follow closely, suspect cardiorenal.  4. CAD: s/p CABG.  EF has fallen considerably compared to last echo in 2013.  No recent chest pain.  Troponin mildly and stably elevated at admission, suspect demand ischemia with volume overload rather than ACS.  Concerned about progression of CAD however.  No coronary angiography yet with no ACS and elevated creatinine.  - Continue ASA, holding statin for now with spike in LFTs (restart when start to trend down).  5. Atrial fibrillation: New this admission. He is currently back in NSR on amiodarone gtt.  - Continue heparin gtt. Hgb stable.  - Continue IV amiodarone for now while on milrinone.   6. Elevated LFTs: Marked rise in LFTs.  Abdominal US as above.   Suspect shock liver given cardiogenic shock with hypotension initially.   - LFTs trending down.  - Holding statin.  - Will need to use amiodarone for now for rate/rhythm control.  7. ?LV thrombus: Repeat ECHO did not show LV thrombus.  8. Nausea: ? Amiodarone. CO-OX ok. Will give Zofran.   OOB today.    Amy Clegg NP-C  08/27/2016 7:05 AM  Patient seen with NP, agree with the above note.  Good diuresis again, CVP down to 6-7 with co-ox 67%.  He is back in NSR on amiodarone gtt this morning.  Some nausea.   Try to wean off norepinephrine today.  Keep on milrinone.  Hold IV diuretics, probably to po tomorrow.  Sodium improved, 1 more dose of tolvaptan today.  Continue IV amiodarone.    Out of bed to chair.   35 minutes critical care time.   Marca Ancona 08/27/2016 7:33 AM

## 2016-08-27 NOTE — Progress Notes (Signed)
   Called by nursing staff for hematuria. Spontaneously resolved.     Hemoglobin stable. Check UA. Stop heparin for 4 hour then restart.    Discussed with Dr Shirlee Latch.   Amy Clegg NP-C  1:09 PM

## 2016-08-27 NOTE — Plan of Care (Signed)
Problem: Bowel/Gastric: Goal: Will not experience complications related to bowel motility Outcome: Not Progressing Last BM 08/19/2016. No results from stool softener, miralax and ducolax given yesterday. Pt vomited last night several times.   Amy Clegg notified, orders received.

## 2016-08-27 NOTE — Evaluation (Signed)
Physical Therapy Evaluation Patient Details Name: [name] MRN: [id number] DOB: 05-Oct-1934 Today's Date: 08/27/2016   History of Present Illness  Pt adm with acute on chronic chf. Developed cardiogenic shock on 1/9 with significant hypotension. PMH - CABG, cardiomyopathy, DM, HTN, arthritis, ICD  Clinical Impression  Pt admitted with above diagnosis and presents to PT with functional limitations due to deficits listed below (See PT problem list). Pt needs skilled PT to maximize independence and safety to allow discharge to SNF. Pt with limited mobility prior to hospitalization (nonambulatory and using scooter but able to do so independently) and now requiring assist for even basic bed mobility and unable to transfer to chair with 2 person assist. Pt home alone at times and feel he will need ST-SNF prior to returning there.     Follow Up Recommendations SNF    Equipment Recommendations  None recommended by PT    Recommendations for Other Services       Precautions / Restrictions Precautions Precautions: Fall Restrictions Weight Bearing Restrictions: No      Mobility  Bed Mobility Overal bed mobility: Needs Assistance Bed Mobility: Supine to Sit;Sit to Supine     Supine to sit: +2 for physical assistance;Mod assist Sit to supine: +2 for safety/equipment;Min assist   General bed mobility comments: Assist to bring legs off EOB, elevate trunk into sitting, and bring hips to EOB.  Transfers Overall transfer level: Needs assistance Equipment used: 2 person hand held assist Transfers: Sit to/from Stand Sit to Stand: +2 physical assistance;Max assist         General transfer comment: Only able to partially rise with +2 max A. Hips only cleared bed by ~4 inches.  Ambulation/Gait                Stairs            Wheelchair Mobility    Modified Rankin (Stroke Patients Only)       Balance Overall balance assessment: Needs assistance Sitting-balance  support: No upper extremity supported;Feet supported Sitting balance-Leahy Scale: Fair         Standing balance comment: Unable to stand                              Pertinent Vitals/Pain Pain Assessment: No/denies pain    Home Living Family/patient expects to be discharged to:: Private residence Living Arrangements: Other relatives (grandson) Available Help at Discharge: Family;Available PRN/intermittently Type of Home: House Home Access: Level entry     Home Layout: One level 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 Pt nonambulatory           Hand Dominance        Extremity/Trunk Assessment   Upper Extremity Assessment Upper Extremity Assessment: Generalized weakness    Lower Extremity Assessment Lower Extremity Assessment: RLE deficits/detail;LLE deficits/detail RLE Deficits / Details: grossly 2+/5 LLE Deficits / Details: grossly 2+/5       Communication      Cognition Arousal/Alertness: Awake/alert Behavior During Therapy: WFL for tasks assessed/performed Overall Cognitive Status: Within Functional Limits for tasks assessed                      General Comments      Exercises     Assessment/Plan    PT Assessment  Patient needs continued PT services  PT Problem List Decreased strength;Decreased activity tolerance;Decreased balance;Decreased mobility          PT Treatment Interventions DME instruction;Functional mobility training;Therapeutic activities;Therapeutic exercise;Balance training;Patient/family education    PT Goals (Current goals can be found in the Care Plan section)  Acute Rehab PT Goals Patient Stated Goal: return home PT Goal Formulation: With patient Time For Goal Achievement: 09/10/16 Potential to Achieve Goals: Good    Frequency  Min 3X/week   Barriers to discharge Decreased caregiver support Pt alone while grandson works    Co-evaluation               End of Journalist, newspaper During Treatment: Gait belt;Oxygen Activity Tolerance: Patient limited by fatigue Patient left: in bed;with call bell/phone within reach;with nursing/sitter in room;with family/visitor present Nurse Communication: Mobility status         Time: 1202-1223 PT Time Calculation (min) (ACUTE ONLY): 21 min   Charges:   PT Evaluation $PT Eval Moderate Complexity: 1 Procedure     PT G CodesAngelina Ok Maycok 2016-09-26, 2:17 PM [employer] PT [phone]

## 2016-08-28 LAB — GLUCOSE, CAPILLARY
GLUCOSE-CAPILLARY: 184 mg/dL — AB (ref 65–99)
GLUCOSE-CAPILLARY: 204 mg/dL — AB (ref 65–99)
Glucose-Capillary: 155 mg/dL — ABNORMAL HIGH (ref 65–99)
Glucose-Capillary: 141 mg/dL — ABNORMAL HIGH (ref 65–99)

## 2016-08-28 LAB — COMPREHENSIVE METABOLIC PANEL
ALT: 1150 U/L — ABNORMAL HIGH (ref 17–63)
ANION GAP: 11 (ref 5–15)
AST: 341 U/L — ABNORMAL HIGH (ref 15–41)
Albumin: 3.3 g/dL — ABNORMAL LOW (ref 3.5–5.0)
Alkaline Phosphatase: 200 U/L — ABNORMAL HIGH (ref 38–126)
BUN: 25 mg/dL — ABNORMAL HIGH (ref 6–20)
CHLORIDE: 88 mmol/L — AB (ref 101–111)
CO2: 28 mmol/L (ref 22–32)
Calcium: 8.9 mg/dL (ref 8.9–10.3)
Creatinine, Ser: 1.84 mg/dL — ABNORMAL HIGH (ref 0.61–1.24)
GFR, EST AFRICAN AMERICAN: 38 mL/min — AB (ref >60)
GFR, EST NON AFRICAN AMERICAN: 33 mL/min — AB (ref >60)
Glucose, Bld: 188 mg/dL — ABNORMAL HIGH (ref 65–99)
POTASSIUM: 4.7 mmol/L (ref 3.5–5.1)
SODIUM: 127 mmol/L — AB (ref 135–145)
Total Bilirubin: 1.6 mg/dL — ABNORMAL HIGH (ref 0.3–1.2)
Total Protein: 6.1 g/dL — ABNORMAL LOW (ref 6.5–8.1)

## 2016-08-28 LAB — CBC
HCT: 29.7 % — ABNORMAL LOW (ref 39.0–52.0)
Hemoglobin: 10.3 g/dL — ABNORMAL LOW (ref 13.0–17.0)
MCH: 27.7 pg (ref 26.0–34.0)
MCHC: 34.7 g/dL (ref 30.0–36.0)
MCV: 79.8 fL (ref 78.0–100.0)
PLATELETS: 122 10*3/uL — AB (ref 150–400)
RBC: 3.72 MIL/uL — AB (ref 4.22–5.81)
RDW: 13.6 % (ref 11.5–15.5)
WBC: 7 10*3/uL (ref 4.0–10.5)

## 2016-08-28 LAB — COOXEMETRY PANEL
CARBOXYHEMOGLOBIN: 1.4 % (ref 0.5–1.5)
METHEMOGLOBIN: 1 % (ref 0.0–1.5)
O2 SAT: 78.9 %
TOTAL HEMOGLOBIN: 10.3 g/dL — AB (ref 12.0–16.0)

## 2016-08-28 LAB — HEPARIN LEVEL (UNFRACTIONATED): Heparin Unfractionated: 0.38 IU/mL (ref 0.30–0.70)

## 2016-08-28 MED ORDER — MAGNESIUM SULFATE 2 GM/50ML IV SOLN
2.0000 g | Freq: Once | INTRAVENOUS | Status: AC
  Administered 2016-08-28: 2 g via INTRAVENOUS
  Filled 2016-08-28: qty 50

## 2016-08-28 MED ORDER — TORSEMIDE 20 MG PO TABS
40.0000 mg | ORAL_TABLET | Freq: Every day | ORAL | Status: DC
  Administered 2016-08-28: 40 mg via ORAL
  Filled 2016-08-28: qty 2

## 2016-08-28 MED ORDER — SODIUM CHLORIDE 0.9 % IV SOLN: INTRAVENOUS | Status: DC | PRN

## 2016-08-28 MED ORDER — POTASSIUM CHLORIDE CRYS ER 20 MEQ PO TBCR
40.0000 meq | EXTENDED_RELEASE_TABLET | Freq: Every day | ORAL | Status: DC
  Administered 2016-08-28 – 2016-09-02 (×6): 40 meq via ORAL
  Filled 2016-08-28 (×6): qty 2

## 2016-08-28 NOTE — Progress Notes (Signed)
Patient ID: [name], male   DOB: Jan 11, 1935, 82 y.o.   MRN: [id number]   SUBJECTIVE: Patient admitted with acute on chronic systolic CHF.  Developed cardiogenic shock on 1/9 with significant hypotension requiring initiation of norepinephrine.  Poor UOP 1/10 am with rising creatinine and low co-ox.  Milrinone increased to 0.25 and continued on low dose norepinephrine.  Improved co-ox with very good UOP with med adjustment.  Tolvaptan begun for hyponatremia.   This morning, he feels much better.  No more nausea after BM yesterday. Denies SOB. SBP 90s-100s on norepinephrine 5 mcg + milrinone 0.25.  Co-ox 79% this morning.  I/O negative and weight down 2 lbs.  Creatinine down to 1.84. Sodium up to 127. CVP now 6.   At baseline, he is in a scooter at home due to arthritis.  He was able to sit on the side of the bed yesterday but not much more.   He is remains in NSR today on amiodarone.      He had transient hematuria yesterday, has now resolved and remains on heparin.   LFTs elevated but trending down, abdominal US showed echogenic liver consistent with fatty liver disease versus hepatocellular disease.   Echo: 08/24/2016 EF 15%, moderate LVH, mild-moderate MR, cannot rule out thrombus.  ECHO: 08/26/2016: EF 10-15% No evidence of thrombus RV mildly dilated.   Scheduled Meds: . aspirin EC  81 mg Oral Daily  . digoxin  0.125 mg Oral Daily  . docusate sodium  100 mg Oral Daily  . [START ON 08/29/2016] Influenza vac split quadrivalent PF  0.5 mL Intramuscular Once  . insulin aspart  0-15 Units Subcutaneous TID WC  . magnesium sulfate 1 - 4 g bolus IVPB  2 g Intravenous Once  . pantoprazole  80 mg Oral Q1200  . polyethylene glycol  17 g Oral Daily  . potassium chloride  40 mEq Oral Daily  . sodium chloride flush  10-40 mL Intracatheter Q12H  . torsemide  40 mg Oral Daily   Continuous Infusions: . amiodarone 30 mg/hr (08/28/16 0800)  . heparin 1,200 Units/hr (08/28/16 0800)  . milrinone 0.25  mcg/kg/min (08/28/16 0800)  . norepinephrine (LEVOPHED) Adult infusion 4 mcg/min (08/28/16 0745)   PRN Meds:.sodium chloride, bisacodyl, LORazepam, promethazine, sodium chloride flush    Vitals:   08/28/16 0600 08/28/16 0730 08/28/16 0800 08/28/16 0809  BP: 104/60 101/70 98/79 98/79   Pulse: 96 100 100 (!) 101  Resp: 15 12 20  (!) 30  Temp:    97.8 F (36.6 C)  TempSrc:    Oral  SpO2: 98% 100% 99% 100%  Weight:      Height:        Intake/Output Summary (Last 24 hours) at 08/28/16 0843 Last data filed at 08/28/16 0800  Gross per 24 hour  Intake          2007.11 ml  Output             4665 ml  Net         -2657.89 ml    LABS: Basic Metabolic Panel:  Recent Labs  08/30/16 0407 08/28/16 0449  NA 127* 127*  K 3.9 4.7  CL 85* 88*  CO2 29 28  GLUCOSE 196* 188*  BUN 32* 25*  CREATININE 2.07* 1.84*  CALCIUM 8.8* 8.9  MG 1.6*  --    Liver Function Tests:  Recent Labs  08/27/16 0407 08/28/16 0449  AST 611* 341*  ALT 1,383* 1,150*  ALKPHOS 221* 200*  BILITOT 1.4* 1.6*  PROT 5.9* 6.1*  ALBUMIN 3.4* 3.3*   No results for input(s): LIPASE, AMYLASE in the last 72 hours. CBC:  Recent Labs  08/27/16 1428 08/28/16 0449  WBC 5.9 7.0  HGB 10.2* 10.3*  HCT 29.7* 29.7*  MCV 80.5 79.8  PLT 118* 122*   Cardiac Enzymes: No results for input(s): CKTOTAL, CKMB, CKMBINDEX, TROPONINI in the last 72 hours. BNP: Invalid input(s): POCBNP D-Dimer: No results for input(s): DDIMER in the last 72 hours. Hemoglobin A1C: No results for input(s): HGBA1C in the last 72 hours. Fasting Lipid Panel: No results for input(s): CHOL, HDL, LDLCALC, TRIG, CHOLHDL, LDLDIRECT in the last 72 hours. Thyroid Function Tests: No results for input(s): TSH, T4TOTAL, T3FREE, THYROIDAB in the last 72 hours.  Invalid input(s): FREET3 Anemia Panel: No results for input(s): VITAMINB12, FOLATE, FERRITIN, TIBC, IRON, RETICCTPCT in the last 72 hours.  RADIOLOGY: Dg Chest Port 1 View  Result  Date: 08/24/2016 CLINICAL DATA:  82 y/o  M; central line placement. EXAM: PORTABLE CHEST 1 VIEW COMPARISON:  08/23/2016 chest radiograph FINDINGS: Stable cardiomegaly. Stable configuration of pacemaker. Left central venous catheter tip projects over the mid SVC. Clear lungs. No pneumothorax or pleural effusion. Post median sternotomy changes. IMPRESSION: No acute cardiopulmonary process. Stable cardiomegaly. Left central venous catheter tip projects over mid SVC. Electronically Signed   By: Mitzi Hansen M.D.   On: 08/24/2016 18:55   Dg Chest Portable 1 View  Result Date: 08/23/2016 CLINICAL DATA:  Hypotension.  Dizziness when standing. EXAM: PORTABLE CHEST 1 VIEW COMPARISON:  One-view chest x-ray 03/01/2010 FINDINGS: Cardiac enlargement is stable. Chronic interstitial coarsening is present without superimposed edema. AICD lead is stable. No focal airspace disease is present. Pacing wires are in place. Visualized soft tissues and bony thorax are unremarkable. IMPRESSION: 1. No acute cardiopulmonary disease. 2. Cardiomegaly without failure. Electronically Signed   By: Marin Roberts M.D.   On: 08/23/2016 15:49   Dg Abd Portable 1v  Result Date: 08/27/2016 CLINICAL DATA:  Nausea and vomiting EXAM: PORTABLE ABDOMEN - 1 VIEW COMPARISON:  CT abdomen and pelvis March 01, 2010 FINDINGS: Stomach appears moderately distended with air. There is no small or large bowel dilatation or air-fluid level. No free air evident. There is lumbar levoscoliosis with extensive arthropathy. There are multiple foci of vascular calcification in the pelvis. There are surgical clips in the right upper quadrant region. IMPRESSION: Stomach distended with air. No small or large bowel dilatation to suggest bowel obstruction. No free air. Advanced arthropathy and scoliosis in the lumbar region. Multifocal iliac and common femoral artery atherosclerosis. Electronically Signed   By: Bretta Bang III M.D.   On: 08/27/2016  09:12   US Abdomen Limited Ruq  Result Date: 08/25/2016 CLINICAL DATA:  Elevated liver function tests. EXAM: US ABDOMEN LIMITED - RIGHT UPPER QUADRANT COMPARISON:  CT 03/01/2010. FINDINGS: Gallbladder: Cholecystectomy. Common bile duct: Diameter: 3.0 mm Liver: There is echogenic consistent with fatty infiltration and/or hepatocellular disease. No focal hepatic abnormality identified. Ascites and right pleural effusion noted. IMPRESSION: 1. Cholecystectomy.  No biliary distention. 2. Liver is echogenic consistent fatty infiltration and/or hepatocellular disease. No focal hepatic abnormality identified. 3. Ascites and right pleural effusion noted. Electronically Signed   By: Maisie Fus  Register   On: 08/25/2016 09:19    PHYSICAL EXAM CVP 6 General: NAD Neck: JVP 6-7 cm, no thyromegaly or thyroid nodule.  Lungs: Decreased in the bases.  CV: Lateral PMI.  Irregular S1/S2, no S3/S4, no murmur.  R  and LLE trace edema.    Abdomen: Soft, nontender, no hepatosplenomegaly, no distention.  Neurologic: Alert and oriented x 3.  Psych: Normal affect. Extremities: No clubbing or cyanosis. Warm   TELEMETRY: NSR  ASSESSMENT AND PLAN: 82 yo with history of CAD s/p CABG, ischemic cardiomyopathy, CKD stage III, HTN, DM was admitted with acute on chronic systolic CHF with cardiogenic shock (hypotensive/tachycardic).  1. Cardiogenic shock/acute on chronic systolic CHF: Echo showed EF down to 15% from 45-50% on last echo in 2013.  Ischemic cardiomyopathy.  Has ICD.  Marked hypotension on 1/9, start on norepinephrine and now on milrinone. Co-ox improved with increased milrinone, 78% today.  CVP 6.  - Continue milrinone 0.25.  Titrate off norepinephrine today, goal SBP 85 or above.  - Now on digoxin for inotropy and to help with rate control as long as creatinine remains stable.  - Start torsemide 40 daily today.  - Given age, unlikely to be candidate for advanced therapies.  - Given fall in EF, ideally would have  coronary angiography.  Have held off so far with elevated creatinine.  2. Hyponatremia: As low as 119.  Suspect hypervolemic hyponatremia in setting of CHF.  Na up to 127 today.  - Stop tolvaptan, will continue fluid restriction.  3. AKI on CKD stage III: Baseline creatinine had been around 1.4, worse recently.  Up to 2.3 since admission, 1.8 today.  Follow closely, suspect cardiorenal.  4. CAD: s/p CABG.  EF has fallen considerably compared to last echo in 2013.  No recent chest pain.  Troponin mildly and stably elevated at admission, suspect demand ischemia with volume overload rather than ACS.  Concerned about progression of CAD however.  No coronary angiography yet with no ACS and elevated creatinine.  - Continue ASA, holding statin for now with spike in LFTs (will restart as LFTs come down).  - If creatinine trends down further, may do coronary angiography this admission.  5. Atrial fibrillation: New this admission. He is currently back in NSR on amiodarone gtt.  - Continue heparin gtt. Hgb stable. Eventually will transition to DOAC.  - Continue IV amiodarone for now while on milrinone/norepinephrine, switch to po as we titrate these down.   6. Elevated LFTs: Marked rise in LFTs.  Abdominal US as above.  Suspect shock liver given cardiogenic shock with hypotension initially.   - LFTs trending down.  - Holding statin.  - Will need to use amiodarone for now for rate/rhythm control.  7. ?LV thrombus: Repeat ECHO did not show LV thrombus.  8. Work with PT again today.  Will need SNF at discharge.   35 minutes critical care time.   Marca Ancona 08/28/2016 8:43 AM

## 2016-08-28 NOTE — Plan of Care (Signed)
Problem: Activity: Goal: Risk for activity intolerance will decrease Outcome: Progressing Educated pt on the need to reposition self at least every two hours, and to start using arms and legs/exercise to maintain/increase strength.

## 2016-08-28 NOTE — Progress Notes (Signed)
ANTICOAGULATION CONSULT NOTE  Pharmacy Consult for Heparin Indication: atrial fibrillation  No Known Allergies  Patient Measurements: Height: 5\' 10"  (177.8 cm) Weight: 201 lb 11.5 oz (91.5 kg) IBW/kg (Calculated) : 73 Heparin Dosing Weight: 90 kg  Vital Signs: Temp: 98.2 F (36.8 C) (01/13 0300) Temp Source: Oral (01/13 0300) BP: 101/70 (01/13 0730) Pulse Rate: 100 (01/13 0730)  Labs:  Recent Labs  08/26/16 0500 08/26/16 1630 08/27/16 0407 08/27/16 1428 08/28/16 0449  HGB  --   --  10.4* 10.2* 10.3*  HCT  --   --  29.7* 29.7* 29.7*  PLT  --   --  109* 118* 122*  HEPARINUNFRC 0.68  --  0.63  --  0.38  CREATININE  --  2.12* 2.07*  --  1.84*    Estimated Creatinine Clearance: 37 mL/min (by C-G formula based on SCr of 1.84 mg/dL (H)).    Assessment: 82 yo M  presented to ED with hypotension and tachycardia. Found to be in afib and started on amio infusion with return of NSR. Pharmacy dosing heparin, heparin level remains therapeutic at 0.38 on heparin drip rate 1200 uts/hr.  CBC stable. S/p hematuria 1/12 now resolved.  Plan to decrease HL goal 0.3-0.5.   Goal of Therapy:  Heparin level 0.3-0.7 units/ml New goal 1/12 0.3-0.5 Monitor platelets by anticoagulation protocol: Yes   Plan:  Continue heparin gtt at 1200 units/hr Daily heparin level, CBC Monitor s/sx of bleeding  3/12 Pharm.D. CPP, BCPS Clinical Pharmacist [phone] 08/28/2016 8:01 AM

## 2016-08-29 LAB — COMPREHENSIVE METABOLIC PANEL
ALBUMIN: 3.3 g/dL — AB (ref 3.5–5.0)
ALK PHOS: 179 U/L — AB (ref 38–126)
ALT: 806 U/L — ABNORMAL HIGH (ref 17–63)
ANION GAP: 10 (ref 5–15)
AST: 171 U/L — ABNORMAL HIGH (ref 15–41)
BUN: 24 mg/dL — ABNORMAL HIGH (ref 6–20)
CHLORIDE: 89 mmol/L — AB (ref 101–111)
CO2: 26 mmol/L (ref 22–32)
Calcium: 8.7 mg/dL — ABNORMAL LOW (ref 8.9–10.3)
Creatinine, Ser: 1.83 mg/dL — ABNORMAL HIGH (ref 0.61–1.24)
GFR calc Af Amer: 39 mL/min — ABNORMAL LOW (ref >60)
GFR calc non Af Amer: 33 mL/min — ABNORMAL LOW (ref >60)
GLUCOSE: 155 mg/dL — AB (ref 65–99)
POTASSIUM: 4.4 mmol/L (ref 3.5–5.1)
SODIUM: 125 mmol/L — AB (ref 135–145)
Total Bilirubin: 1.2 mg/dL (ref 0.3–1.2)
Total Protein: 6 g/dL — ABNORMAL LOW (ref 6.5–8.1)

## 2016-08-29 LAB — IRON AND TIBC
Iron: 44 ug/dL — ABNORMAL LOW (ref 45–182)
SATURATION RATIOS: 13 % — AB (ref 17.9–39.5)
TIBC: 347 ug/dL (ref 250–450)
UIBC: 303 ug/dL

## 2016-08-29 LAB — HEPARIN LEVEL (UNFRACTIONATED): HEPARIN UNFRACTIONATED: 0.4 [IU]/mL (ref 0.30–0.70)

## 2016-08-29 LAB — APTT
APTT: 72 s — AB (ref 24–36)
APTT: 62 s — AB (ref 24–36)

## 2016-08-29 LAB — COOXEMETRY PANEL
CARBOXYHEMOGLOBIN: 1.5 % (ref 0.5–1.5)
METHEMOGLOBIN: 1 % (ref 0.0–1.5)
O2 SAT: 68.6 %
TOTAL HEMOGLOBIN: 9.6 g/dL — AB (ref 12.0–16.0)

## 2016-08-29 LAB — CBC
HCT: 28.2 % — ABNORMAL LOW (ref 39.0–52.0)
HEMOGLOBIN: 9.6 g/dL — AB (ref 13.0–17.0)
MCH: 27.8 pg (ref 26.0–34.0)
MCHC: 34 g/dL (ref 30.0–36.0)
MCV: 81.7 fL (ref 78.0–100.0)
PLATELETS: 92 10*3/uL — AB (ref 150–400)
RBC: 3.45 MIL/uL — ABNORMAL LOW (ref 4.22–5.81)
RDW: 14.2 % (ref 11.5–15.5)
WBC: 4.2 10*3/uL (ref 4.0–10.5)

## 2016-08-29 LAB — MAGNESIUM: Magnesium: 1.9 mg/dL (ref 1.7–2.4)

## 2016-08-29 LAB — GLUCOSE, CAPILLARY
GLUCOSE-CAPILLARY: 158 mg/dL — AB (ref 65–99)
Glucose-Capillary: 194 mg/dL — ABNORMAL HIGH (ref 65–99)
Glucose-Capillary: 168 mg/dL — ABNORMAL HIGH (ref 65–99)
Glucose-Capillary: 225 mg/dL — ABNORMAL HIGH (ref 65–99)

## 2016-08-29 LAB — FERRITIN: Ferritin: 373 ng/mL — ABNORMAL HIGH (ref 24–336)

## 2016-08-29 MED ORDER — FUROSEMIDE 10 MG/ML IJ SOLN
80.0000 mg | Freq: Two times a day (BID) | INTRAMUSCULAR | Status: DC
  Administered 2016-08-29 (×2): 80 mg via INTRAVENOUS
  Filled 2016-08-29 (×2): qty 8

## 2016-08-29 MED ORDER — SODIUM CHLORIDE 0.9 % IV SOLN
0.0500 mg/kg/h | INTRAVENOUS | Status: DC
  Administered 2016-08-29 – 2016-08-30 (×2): 0.05 mg/kg/h via INTRAVENOUS
  Filled 2016-08-29 (×2): qty 250

## 2016-08-29 MED ORDER — TOLVAPTAN 15 MG PO TABS
15.0000 mg | ORAL_TABLET | Freq: Once | ORAL | Status: AC
  Administered 2016-08-29: 15 mg via ORAL
  Filled 2016-08-29: qty 1

## 2016-08-29 MED ORDER — SPIRONOLACTONE 25 MG PO TABS
12.5000 mg | ORAL_TABLET | Freq: Every day | ORAL | Status: DC
  Administered 2016-08-29 – 2016-09-02 (×5): 12.5 mg via ORAL
  Filled 2016-08-29 (×5): qty 1

## 2016-08-29 NOTE — Progress Notes (Signed)
ANTICOAGULATION CONSULT NOTE  Pharmacy Consult for Bivalirudan Indication: atrial fibrillation  No Known Allergies  Patient Measurements: Height: 5\' 10"  (177.8 cm) Weight: 203 lb 14.8 oz (92.5 kg) IBW/kg (Calculated) : 73 Heparin Dosing Weight: 90 kg  Vital Signs: Temp: 98 F (36.7 C) (01/14 2000) Temp Source: Oral (01/14 2000) BP: 101/61 (01/14 2000) Pulse Rate: 87 (01/14 2000)  Labs:  Recent Labs  08/27/16 0407 08/27/16 1428 08/28/16 0449 08/29/16 0230 08/29/16 1300 08/29/16 1955  HGB 10.4* 10.2* 10.3* 9.6*  --   --   HCT 29.7* 29.7* 29.7* 28.2*  --   --   PLT 109* 118* 122* 92*  --   --   APTT  --   --   --   --  72* 62*  HEPARINUNFRC 0.63  --  0.38 0.40  --   --   CREATININE 2.07*  --  1.84* 1.83*  --   --     Estimated Creatinine Clearance: 37.4 mL/min (by C-G formula based on SCr of 1.83 mg/dL (H)).    Assessment: 82 yo M  presented to ED with hypotension and tachycardia. Found to be in afib and started on amio infusion with return of NSR. Pharmacy dosing heparin with plans to switch to DOAC when stable.  Heparin level remains therapeutic at 0.4 on heparin drip rate 1200 uts/hr.  CBC stable. S/p hematuria 1/12 now resolved.  Plan to decrease HL goal 0.3-0.5.  PLTC have trended down 180>90s   concern for HIT - labs drawn/sent, but also admitted with LFTs in 1000s this could be effect from that.   Will stop heparin and start bivalirudan until HIT panel back.  PTT now therapeutic x 2   Goal of Therapy:  APTT 50-85sec Heparin level 0.3-0.7 units/ml New goal 1/12 0.3-0.5 Monitor platelets by anticoagulation protocol: Yes   Plan:  Continue Bivalirudan 0.05 mg/kg/hr Daily aPTT, CBC Monitor s/sx of bleeding  Thank you 3/12, PharmD [phone]  08/29/2016 8:31 PM

## 2016-08-29 NOTE — Progress Notes (Addendum)
ANTICOAGULATION CONSULT NOTE  Pharmacy Consult for Heparin > bivalirudan Indication: atrial fibrillation  No Known Allergies  Patient Measurements: Height: 5\' 10"  (177.8 cm) Weight: 203 lb 14.8 oz (92.5 kg) IBW/kg (Calculated) : 73 Heparin Dosing Weight: 90 kg  Vital Signs: Temp: 97.6 F (36.4 C) (01/14 0300) Temp Source: Oral (01/14 0300) BP: 82/56 (01/14 0600) Pulse Rate: 90 (01/14 0600)  Labs:  Recent Labs  08/27/16 0407 08/27/16 1428 08/28/16 0449 08/29/16 0230  HGB 10.4* 10.2* 10.3* 9.6*  HCT 29.7* 29.7* 29.7* 28.2*  PLT 109* 118* 122* 92*  HEPARINUNFRC 0.63  --  0.38 0.40  CREATININE 2.07*  --  1.84* 1.83*    Estimated Creatinine Clearance: 37.4 mL/min (by C-G formula based on SCr of 1.83 mg/dL (H)).    Assessment: 82 yo M  presented to ED with hypotension and tachycardia. Found to be in afib and started on amio infusion with return of NSR. Pharmacy dosing heparin with plans to switch to DOAC when stable.  Heparin level remains therapeutic at 0.4 on heparin drip rate 1200 uts/hr.  CBC stable. S/p hematuria 1/12 now resolved.  Plan to decrease HL goal 0.3-0.5.  PLTC have trended down 180>90s   concern for HIT - labs drawn/sent, but also admitted with LFTs in 1000s this could be effect from that.   Will stop heparin and start bivalirudan until HIT panel back.   Goal of Therapy:  APTT 50-85sec Heparin level 0.3-0.7 units/ml New goal 1/12 0.3-0.5 Monitor platelets by anticoagulation protocol: Yes   Plan:  Stop heparin drip Begin Bivalirudan 0.05 mg/kg/hr Draw aPTT in 4hr after start  Daily aPTT, CBC Monitor s/sx of bleeding  3/12 Pharm.D. CPP, BCPS Clinical Pharmacist [phone] 08/29/2016 7:56 AM    PM Addendum 08/31/2016 0.05mg /kg/hr  Initial aPTT 72 sec at goal - no change  Will recheck this pm to ensure within range then daily  Bess Harvest Pharm.D. CPP, BCPS Clinical Pharmacist [phone] 08/29/2016 2:33 PM

## 2016-08-29 NOTE — Progress Notes (Addendum)
Patient ID: [name], male   DOB: 1935/05/06, 82 y.o.   MRN: [id number]   SUBJECTIVE: Patient admitted with acute on chronic systolic CHF.  Developed cardiogenic shock on 1/9 with significant hypotension requiring initiation of norepinephrine.  Poor UOP 1/10 am with rising creatinine and low co-ox.  Milrinone increased to 0.25 and continued on low dose norepinephrine.  Improved co-ox with very good UOP with med adjustment.  Tolvaptan begun for hyponatremia.   Stable today.  He is now off norepinephrine and remains on milrinone 0.25.  Transitioned to torsemide yesterday but CVP up to 12 today.  Co-ox 69%.    At baseline, he is in a scooter at home due to arthritis.  He was able to sit on the side of the bed yesterday but not much more.   Platelets have trended down.   He got tolvaptan for hyponatremia, now off.  Sodium is lower today.   He is remains in NSR today on amiodarone.      LFTs elevated but trending down, abdominal US showed echogenic liver consistent with fatty liver disease versus hepatocellular disease.   Echo: 08/24/2016 EF 15%, moderate LVH, mild-moderate MR, cannot rule out thrombus.  ECHO: 08/26/2016: EF 10-15% No evidence of thrombus RV mildly dilated.   Scheduled Meds: . aspirin EC  81 mg Oral Daily  . digoxin  0.125 mg Oral Daily  . docusate sodium  100 mg Oral Daily  . furosemide  80 mg Intravenous BID  . Influenza vac split quadrivalent PF  0.5 mL Intramuscular Once  . insulin aspart  0-15 Units Subcutaneous TID WC  . pantoprazole  80 mg Oral Q1200  . polyethylene glycol  17 g Oral Daily  . potassium chloride  40 mEq Oral Daily  . sodium chloride flush  10-40 mL Intracatheter Q12H   Continuous Infusions: . amiodarone 30 mg/hr (08/29/16 0600)  . milrinone 0.25 mcg/kg/min (08/29/16 0600)   PRN Meds:.sodium chloride, bisacodyl, LORazepam, promethazine, sodium chloride flush    Vitals:   08/29/16 0500 08/29/16 0600 08/29/16 0700 08/29/16 0800  BP: (!) 88/54  (!) 82/56 (!) 92/54 95/63  Pulse: 89 90 94 96  Resp: 19 15 18 13   Temp:   98.1 F (36.7 C)   TempSrc:      SpO2: 98% 99% 96% 96%  Weight:      Height:        Intake/Output Summary (Last 24 hours) at 08/29/16 0856 Last data filed at 08/29/16 0800  Gross per 24 hour  Intake          1951.77 ml  Output             2850 ml  Net          -898.23 ml    LABS: Basic Metabolic Panel:  Recent Labs  40/98/11 0407 08/28/16 0449 08/29/16 0230  NA 127* 127* 125*  K 3.9 4.7 4.4  CL 85* 88* 89*  CO2 29 28 26   GLUCOSE 196* 188* 155*  BUN 32* 25* 24*  CREATININE 2.07* 1.84* 1.83*  CALCIUM 8.8* 8.9 8.7*  MG 1.6*  --  1.9   Liver Function Tests:  Recent Labs  08/28/16 0449 08/29/16 0230  AST 341* 171*  ALT 1,150* 806*  ALKPHOS 200* 179*  BILITOT 1.6* 1.2  PROT 6.1* 6.0*  ALBUMIN 3.3* 3.3*   No results for input(s): LIPASE, AMYLASE in the last 72 hours. CBC:  Recent Labs  08/28/16 0449 08/29/16 0230  WBC 7.0 4.2  HGB 10.3* 9.6*  HCT 29.7* 28.2*  MCV 79.8 81.7  PLT 122* 92*   Cardiac Enzymes: No results for input(s): CKTOTAL, CKMB, CKMBINDEX, TROPONINI in the last 72 hours. BNP: Invalid input(s): POCBNP D-Dimer: No results for input(s): DDIMER in the last 72 hours. Hemoglobin A1C: No results for input(s): HGBA1C in the last 72 hours. Fasting Lipid Panel: No results for input(s): CHOL, HDL, LDLCALC, TRIG, CHOLHDL, LDLDIRECT in the last 72 hours. Thyroid Function Tests: No results for input(s): TSH, T4TOTAL, T3FREE, THYROIDAB in the last 72 hours.  Invalid input(s): FREET3 Anemia Panel: No results for input(s): VITAMINB12, FOLATE, FERRITIN, TIBC, IRON, RETICCTPCT in the last 72 hours.  RADIOLOGY: Dg Chest Port 1 View  Result Date: 08/24/2016 CLINICAL DATA:  82 y/o  M; central line placement. EXAM: PORTABLE CHEST 1 VIEW COMPARISON:  08/23/2016 chest radiograph FINDINGS: Stable cardiomegaly. Stable configuration of pacemaker. Left central venous catheter tip  projects over the mid SVC. Clear lungs. No pneumothorax or pleural effusion. Post median sternotomy changes. IMPRESSION: No acute cardiopulmonary process. Stable cardiomegaly. Left central venous catheter tip projects over mid SVC. Electronically Signed   By: Mitzi Hansen M.D.   On: 08/24/2016 18:55   Dg Chest Portable 1 View  Result Date: 08/23/2016 CLINICAL DATA:  Hypotension.  Dizziness when standing. EXAM: PORTABLE CHEST 1 VIEW COMPARISON:  One-view chest x-ray 03/01/2010 FINDINGS: Cardiac enlargement is stable. Chronic interstitial coarsening is present without superimposed edema. AICD lead is stable. No focal airspace disease is present. Pacing wires are in place. Visualized soft tissues and bony thorax are unremarkable. IMPRESSION: 1. No acute cardiopulmonary disease. 2. Cardiomegaly without failure. Electronically Signed   By: Marin Roberts M.D.   On: 08/23/2016 15:49   Dg Abd Portable 1v  Result Date: 08/27/2016 CLINICAL DATA:  Nausea and vomiting EXAM: PORTABLE ABDOMEN - 1 VIEW COMPARISON:  CT abdomen and pelvis March 01, 2010 FINDINGS: Stomach appears moderately distended with air. There is no small or large bowel dilatation or air-fluid level. No free air evident. There is lumbar levoscoliosis with extensive arthropathy. There are multiple foci of vascular calcification in the pelvis. There are surgical clips in the right upper quadrant region. IMPRESSION: Stomach distended with air. No small or large bowel dilatation to suggest bowel obstruction. No free air. Advanced arthropathy and scoliosis in the lumbar region. Multifocal iliac and common femoral artery atherosclerosis. Electronically Signed   By: Bretta Bang III M.D.   On: 08/27/2016 09:12   US Abdomen Limited Ruq  Result Date: 08/25/2016 CLINICAL DATA:  Elevated liver function tests. EXAM: US ABDOMEN LIMITED - RIGHT UPPER QUADRANT COMPARISON:  CT 03/01/2010. FINDINGS: Gallbladder: Cholecystectomy. Common bile  duct: Diameter: 3.0 mm Liver: There is echogenic consistent with fatty infiltration and/or hepatocellular disease. No focal hepatic abnormality identified. Ascites and right pleural effusion noted. IMPRESSION: 1. Cholecystectomy.  No biliary distention. 2. Liver is echogenic consistent fatty infiltration and/or hepatocellular disease. No focal hepatic abnormality identified. 3. Ascites and right pleural effusion noted. Electronically Signed   By: Maisie Fus  Register   On: 08/25/2016 09:19    PHYSICAL EXAM CVP 12 General: NAD Neck: JVP 12 cm, no thyromegaly or thyroid nodule.  Lungs: Decreased in the bases.  CV: Lateral PMI.  Irregular S1/S2, no S3/S4, no murmur.  1+ ankle edema bilaterally.    Abdomen: Soft, nontender, no hepatosplenomegaly, no distention.  Neurologic: Alert and oriented x 3.  Psych: Normal affect. Extremities: No clubbing or cyanosis. Warm   TELEMETRY: NSR  ASSESSMENT AND PLAN:  82 yo with history of CAD s/p CABG, ischemic cardiomyopathy, CKD stage III, HTN, DM was admitted with acute on chronic systolic CHF with cardiogenic shock (hypotensive/tachycardic).  1. Cardiogenic shock/acute on chronic systolic CHF: Echo showed EF down to 15% from 45-50% on last echo in 2013.  Ischemic cardiomyopathy.  Has ICD.  Marked hypotension on 1/9, start on norepinephrine and now on milrinone. Co-ox improved with increased milrinone.  Norepinephrine stopped on 1/13 and transitioned to torsemide.  Today, co-ox good at 69% but CVP up to 12 and looks volume overloaded again on exam.  - Stop torsemide, Lasix 80 mg IV bid today and will give a dose of tolvaptan.  - Continue milrinone 0.25.  Will not titrate down yet as needs further diuresis.  - Now on digoxin for inotropy and to help with rate control as long as creatinine remains stable.  - Given age, unlikely to be candidate for advanced therapies.  - Given fall in EF, ideally would have coronary angiography.  Have held off so far with elevated  creatinine.  2. Hyponatremia: As low as 119.  Suspect hypervolemic hyponatremia in setting of CHF.  Na down a bit to 125 today.  - Continue fluid restriction. - Will give a dose of tolvaptan 15 mg.  3. AKI on CKD stage III: Baseline creatinine had been around 1.4, worse recently.  Up to 2.3 since admission, 1.8 today.  Follow closely, suspect cardiorenal.  4. CAD: s/p CABG.  EF has fallen considerably compared to last echo in 2013.  No recent chest pain.  Troponin mildly and stably elevated at admission, suspect demand ischemia with volume overload rather than ACS.  Concerned about progression of CAD however.  No coronary angiography yet with no ACS and elevated creatinine.  - Continue ASA, holding statin for now with spike in LFTs (will restart as LFTs come down, possibly tomorrow if they continue to fall).  - If creatinine trends down further, may do coronary angiography this admission.  5. Atrial fibrillation: New this admission. He is currently back in NSR on amiodarone gtt. Platelets have been trending down.  - Stop heparin gtt, start bivalirudin.  Will send HIT antibody test.  - Will eventually transition to DOAC, not yet as may do cath this week.   - Continue IV amiodarone for now while on milrinone, switch to po when we titrate down.   6. Elevated LFTs: Marked rise in LFTs.  Abdominal US as above.  Suspect shock liver given cardiogenic shock with hypotension initially.   - LFTs trending down.  - Holding statin.  - Will need to use amiodarone for now for rate/rhythm control.  7. ?LV thrombus: Repeat ECHO did not show LV thrombus.  8. Thrombocytopenia: See above, needs workup for HIT.  9. Anemia: Relatively stable hemoglobin.  Will check Fe stores, IV Fe if low.  10. Work with PT again today, limited mobility at baseline.  Will need SNF at discharge.   35 minutes critical care time.   Marca Ancona 08/29/2016 8:56 AM

## 2016-08-30 LAB — CBC
HCT: 30.9 % — ABNORMAL LOW (ref 39.0–52.0)
HEMOGLOBIN: 10.6 g/dL — AB (ref 13.0–17.0)
MCH: 28.3 pg (ref 26.0–34.0)
MCHC: 34.3 g/dL (ref 30.0–36.0)
MCV: 82.4 fL (ref 78.0–100.0)
Platelets: 106 10*3/uL — ABNORMAL LOW (ref 150–400)
RBC: 3.75 MIL/uL — ABNORMAL LOW (ref 4.22–5.81)
RDW: 14.1 % (ref 11.5–15.5)
WBC: 4.7 10*3/uL (ref 4.0–10.5)

## 2016-08-30 LAB — COMPREHENSIVE METABOLIC PANEL
ALK PHOS: 178 U/L — AB (ref 38–126)
ALT: 659 U/L — AB (ref 17–63)
ANION GAP: 11 (ref 5–15)
AST: 92 U/L — ABNORMAL HIGH (ref 15–41)
Albumin: 3.3 g/dL — ABNORMAL LOW (ref 3.5–5.0)
BILIRUBIN TOTAL: 1.2 mg/dL (ref 0.3–1.2)
BUN: 21 mg/dL — ABNORMAL HIGH (ref 6–20)
CALCIUM: 9.2 mg/dL (ref 8.9–10.3)
CO2: 29 mmol/L (ref 22–32)
CREATININE: 1.78 mg/dL — AB (ref 0.61–1.24)
Chloride: 91 mmol/L — ABNORMAL LOW (ref 101–111)
GFR calc Af Amer: 40 mL/min — ABNORMAL LOW (ref >60)
GFR calc non Af Amer: 35 mL/min — ABNORMAL LOW (ref >60)
Glucose, Bld: 160 mg/dL — ABNORMAL HIGH (ref 65–99)
Potassium: 4 mmol/L (ref 3.5–5.1)
SODIUM: 131 mmol/L — AB (ref 135–145)
TOTAL PROTEIN: 6.5 g/dL (ref 6.5–8.1)

## 2016-08-30 LAB — COOXEMETRY PANEL
CARBOXYHEMOGLOBIN: 1.9 % — AB (ref 0.5–1.5)
Methemoglobin: 0.7 % (ref 0.0–1.5)
O2 SAT: 74.5 %
Total hemoglobin: 10.6 g/dL — ABNORMAL LOW (ref 12.0–16.0)

## 2016-08-30 LAB — GLUCOSE, CAPILLARY
GLUCOSE-CAPILLARY: 199 mg/dL — AB (ref 65–99)
Glucose-Capillary: 181 mg/dL — ABNORMAL HIGH (ref 65–99)
Glucose-Capillary: 203 mg/dL — ABNORMAL HIGH (ref 65–99)

## 2016-08-30 LAB — APTT: aPTT: 63 seconds — ABNORMAL HIGH (ref 24–36)

## 2016-08-30 MED ORDER — MENTHOL 3 MG MT LOZG
1.0000 | LOZENGE | OROMUCOSAL | Status: DC | PRN
  Filled 2016-08-30: qty 9

## 2016-08-30 MED ORDER — TORSEMIDE 20 MG PO TABS
40.0000 mg | ORAL_TABLET | Freq: Two times a day (BID) | ORAL | Status: DC
  Administered 2016-08-30 – 2016-09-01 (×5): 40 mg via ORAL
  Filled 2016-08-30 (×5): qty 2

## 2016-08-30 MED ORDER — GUAIFENESIN-DM 100-10 MG/5ML PO SYRP
5.0000 mL | ORAL_SOLUTION | ORAL | Status: DC | PRN
Start: 2016-08-30 — End: 2016-09-03
  Administered 2016-08-30 – 2016-09-02 (×9): 5 mL via ORAL
  Filled 2016-08-30 (×9): qty 5

## 2016-08-30 MED ORDER — SODIUM CHLORIDE 0.9 % IV SOLN
510.0000 mg | INTRAVENOUS | Status: DC
  Administered 2016-08-30: 510 mg via INTRAVENOUS
  Filled 2016-08-30: qty 17

## 2016-08-30 MED ORDER — BENZONATATE 100 MG PO CAPS
100.0000 mg | ORAL_CAPSULE | Freq: Three times a day (TID) | ORAL | Status: DC | PRN
  Administered 2016-08-31 – 2016-09-01 (×2): 100 mg via ORAL
  Filled 2016-08-30 (×3): qty 1

## 2016-08-30 MED ORDER — TORSEMIDE 20 MG PO TABS: 40.0000 mg | ORAL_TABLET | Freq: Every day | ORAL | Status: DC

## 2016-08-30 MED ORDER — MILRINONE LACTATE IN DEXTROSE 20-5 MG/100ML-% IV SOLN: 0.1250 ug/kg/min | INTRAVENOUS | Status: DC

## 2016-08-30 NOTE — Progress Notes (Signed)
Physical Therapy Treatment Patient Details Name: [name] MRN: [id number] DOB: 1934-09-09 Today's Date: 08/30/2016    History of Present Illness Pt adm with acute on chronic chf. Developed cardiogenic shock on 1/9 with significant hypotension. PMH - CABG, cardiomyopathy, DM, HTN, arthritis, ICD    PT Comments    Pt very motivated and able to work on standing tolerance and strengthening with repeated transfers coming to stand with Stedy.  Pt follows cueing well and VSS throughout session.  Pt ed on continuing UE and LE therex 3 more times during the day to increase activity throughout the day.  Continue to feel pt would benefit from SNF level of care for further therapies at D/C.  Will continue to follow.    Follow Up Recommendations  SNF     Equipment Recommendations  None recommended by PT    Recommendations for Other Services       Precautions / Restrictions Precautions Precautions: Fall Restrictions Weight Bearing Restrictions: No    Mobility  Bed Mobility               General bed mobility comments: pt sitting in recliner.    Transfers Overall transfer level: Needs assistance Equipment used: 2 person hand held assist Transfers: Sit to/from Stand Sit to Stand: Max assist;+2 physical assistance;Mod assist         General transfer comment: pt worked on coming to standing with the stedy and 2 person A.  pt stood x3 with longest standing being ~8mins.  pt able to progress to ModA x2 for coming to standing with 2nd and 3rd stands.    Ambulation/Gait                 Stairs            Wheelchair Mobility    Modified Rankin (Stroke Patients Only)       Balance Overall balance assessment: Needs assistance Sitting-balance support: Single extremity supported;Bilateral upper extremity supported;No upper extremity supported;Feet supported Sitting balance-Leahy Scale: Fair Sitting balance - Comments: Fatigeus quickly requiring UE support, but  initially able to sit without support.     Standing balance support: Bilateral upper extremity supported;During functional activity Standing balance-Leahy Scale: Poor Standing balance comment: pt able to stand in stedy with Bil UE support and work on more upright posture and weight shifting.                      Cognition Arousal/Alertness: Awake/alert Behavior During Therapy: WFL for tasks assessed/performed Overall Cognitive Status: Within Functional Limits for tasks assessed                      Exercises General Exercises - Upper Extremity Shoulder Flexion: AROM;Both;10 reps Elbow Flexion: AROM;Both;10 reps General Exercises - Lower Extremity Long Arc Quad: AROM;Both;10 reps Hip ABduction/ADduction: AROM;Both;10 reps    General Comments        Pertinent Vitals/Pain Pain Assessment: No/denies pain    Home Living                      Prior Function            PT Goals (current goals can now be found in the care plan section) Acute Rehab PT Goals Patient Stated Goal: return home PT Goal Formulation: With patient Time For Goal Achievement: 09/10/16 Potential to Achieve Goals: Good Progress towards PT goals: Progressing toward goals    Frequency    Min  3X/week      PT Plan Current plan remains appropriate    Co-evaluation             End of Session Equipment Utilized During Treatment: Gait belt Activity Tolerance: Patient tolerated treatment well Patient left: in chair;with call bell/phone within reach     Time: 0929-0958 PT Time Calculation (min) (ACUTE ONLY): 29 min  Charges:  $Therapeutic Exercise: 8-22 mins $Therapeutic Activity: 8-22 mins                    G CodesSunny Schlein, [place]  [phone] 08/30/2016, 10:16 AM

## 2016-08-30 NOTE — Progress Notes (Signed)
ANTICOAGULATION CONSULT NOTE  Pharmacy Consult for Bivalirudan Indication: atrial fibrillation  No Known Allergies  Patient Measurements: Height: 5\' 10"  (177.8 cm) Weight: 195 lb 1.7 oz (88.5 kg) IBW/kg (Calculated) : 73 Heparin Dosing Weight: 90 kg  Vital Signs: Temp: 97.4 F (36.3 C) (01/15 0734) Temp Source: Oral (01/15 0734) BP: 106/67 (01/15 0734) Pulse Rate: 88 (01/15 0734)  Labs:  Recent Labs  08/28/16 0449 08/29/16 0230 08/29/16 1300 08/29/16 1955 08/30/16 0348  HGB 10.3* 9.6*  --   --  10.6*  HCT 29.7* 28.2*  --   --  30.9*  PLT 122* 92*  --   --  106*  APTT  --   --  72* 62* 63*  HEPARINUNFRC 0.38 0.40  --   --   --   CREATININE 1.84* 1.83*  --   --  1.78*    Estimated Creatinine Clearance: 37.7 mL/min (by C-G formula based on SCr of 1.78 mg/dL (H)).    Assessment: 82 yo M  presented to ED with hypotension and tachycardia. Found to be in afib and started on amio infusion with return of NSR. Pharmacy dosing heparin with plans to switch to DOAC when stable.  Heparin level remains therapeutic at 0.4 on heparin drip rate 1200 uts/hr.  CBC stable. S/p hematuria 1/12 now resolved.    Heparin continues to be off and bival is running without issues due to concern for HIT, HIT ab pending. Aptt continues to be within goal range, no bleeding issues noted, plt up to 106 and h/h stable. No changes today, antibody results should be back soon.    Goal of Therapy:  APTT 50-85sec Monitor platelets by anticoagulation protocol: Yes   Plan:  Continue Bivalirudan 0.05 mg/kg/hr Daily aPTT, CBC Monitor s/sx of bleeding  Thank you 3/12 PharmD., BCPS Clinical Pharmacist Pager [phone] 08/30/2016 7:39 AM

## 2016-08-30 NOTE — Progress Notes (Signed)
Patient ID: [name], male   DOB: 1934/11/25, 82 y.o.   MRN: [id number]   SUBJECTIVE: Patient admitted with acute on chronic systolic CHF.  Developed cardiogenic shock on 1/9 with significant hypotension requiring initiation of norepinephrine.  Poor UOP 1/10 am with rising creatinine and low co-ox.  Milrinone increased to 0.25 and continued on low dose norepinephrine.  Improved co-ox with very good UOP with med adjustment.  Tolvaptan begun for hyponatremia.   CVP 12 yesterday so placed back on IV lasix and given dose of tolvapatan with Na 127.  Out 4.7 L and down 8 lbs.   CVP 4-5 this am after IV lasix and dose of tolvapatan. No complaints this am. Creatinine and Na stable to improved.   Remains on milrinone 0.25. Coox 74.5% this am.     Very limited mobility at baseline ( uses a scooter at home.)   Platelets stable.    He got tolvaptan for hyponatremia. Sodium up to 131 this am.    He is remains in NSR today on amiodarone.      LFTs elevated but trending down. ABD [place] with echogenic liver consistent with fatty liver disease versus hepatocellular disease.   Echo: 08/24/2016 EF 15%, moderate LVH, mild-moderate MR, cannot rule out thrombus.  ECHO: 08/26/2016: EF 10-15% No evidence of thrombus RV mildly dilated.   Scheduled Meds: . aspirin EC  81 mg Oral Daily  . digoxin  0.125 mg Oral Daily  . docusate sodium  100 mg Oral Daily  . furosemide  80 mg Intravenous BID  . Influenza vac split quadrivalent PF  0.5 mL Intramuscular Once  . insulin aspart  0-15 Units Subcutaneous TID WC  . pantoprazole  80 mg Oral Q1200  . polyethylene glycol  17 g Oral Daily  . potassium chloride  40 mEq Oral Daily  . sodium chloride flush  10-40 mL Intracatheter Q12H  . spironolactone  12.5 mg Oral Daily   Continuous Infusions: . amiodarone 30 mg/hr (08/30/16 0700)  . bivalirudin (ANGIOMAX) infusion 0.5 mg/mL (Non-ACS indications) 0.05 mg/kg/hr (08/30/16 0700)  . milrinone 0.25 mcg/kg/min (08/30/16  0700)   PRN Meds:.sodium chloride, bisacodyl, LORazepam, promethazine, sodium chloride flush    Vitals:   08/30/16 0400 08/30/16 0500 08/30/16 0535 08/30/16 0600  BP: 98/65   (!) 120/53  Pulse: 87 99 87 89  Resp: 17 20 13 17   Temp: 97.9 F (36.6 C)     TempSrc: Oral     SpO2: 97% 97% 94% 100%  Weight:   195 lb 1.7 oz (88.5 kg)   Height:   5\' 10"  (1.778 m)     Intake/Output Summary (Last 24 hours) at 08/30/16 0733 Last data filed at 08/30/16 0700  Gross per 24 hour  Intake          2056.16 ml  Output             6800 ml  Net         -4743.84 ml    LABS: Basic Metabolic Panel:  Recent Labs  09/01/16 0230 08/30/16 0348  NA 125* 131*  K 4.4 4.0  CL 89* 91*  CO2 26 29  GLUCOSE 155* 160*  BUN 24* 21*  CREATININE 1.83* 1.78*  CALCIUM 8.7* 9.2  MG 1.9  --    Liver Function Tests:  Recent Labs  08/29/16 0230 08/30/16 0348  AST 171* 92*  ALT 806* 659*  ALKPHOS 179* 178*  BILITOT 1.2 1.2  PROT 6.0* 6.5  ALBUMIN  3.3* 3.3*   No results for input(s): LIPASE, AMYLASE in the last 72 hours. CBC:  Recent Labs  08/29/16 0230 08/30/16 0348  WBC 4.2 4.7  HGB 9.6* 10.6*  HCT 28.2* 30.9*  MCV 81.7 82.4  PLT 92* 106*   Cardiac Enzymes: No results for input(s): CKTOTAL, CKMB, CKMBINDEX, TROPONINI in the last 72 hours. BNP: Invalid input(s): POCBNP D-Dimer: No results for input(s): DDIMER in the last 72 hours. Hemoglobin A1C: No results for input(s): HGBA1C in the last 72 hours. Fasting Lipid Panel: No results for input(s): CHOL, HDL, LDLCALC, TRIG, CHOLHDL, LDLDIRECT in the last 72 hours. Thyroid Function Tests: No results for input(s): TSH, T4TOTAL, T3FREE, THYROIDAB in the last 72 hours.  Invalid input(s): FREET3 Anemia Panel:  Recent Labs  08/29/16 0903  FERRITIN 373*  TIBC 347  IRON 44*    RADIOLOGY: Dg Chest Port 1 View  Result Date: 08/24/2016 CLINICAL DATA:  82 y/o  M; central line placement. EXAM: PORTABLE CHEST 1 VIEW COMPARISON:   08/23/2016 chest radiograph FINDINGS: Stable cardiomegaly. Stable configuration of pacemaker. Left central venous catheter tip projects over the mid SVC. Clear lungs. No pneumothorax or pleural effusion. Post median sternotomy changes. IMPRESSION: No acute cardiopulmonary process. Stable cardiomegaly. Left central venous catheter tip projects over mid SVC. Electronically Signed   By: Mitzi Hansen M.D.   On: 08/24/2016 18:55   Dg Chest Portable 1 View  Result Date: 08/23/2016 CLINICAL DATA:  Hypotension.  Dizziness when standing. EXAM: PORTABLE CHEST 1 VIEW COMPARISON:  One-view chest x-ray 03/01/2010 FINDINGS: Cardiac enlargement is stable. Chronic interstitial coarsening is present without superimposed edema. AICD lead is stable. No focal airspace disease is present. Pacing wires are in place. Visualized soft tissues and bony thorax are unremarkable. IMPRESSION: 1. No acute cardiopulmonary disease. 2. Cardiomegaly without failure. Electronically Signed   By: Marin Roberts M.D.   On: 08/23/2016 15:49   Dg Abd Portable 1v  Result Date: 08/27/2016 CLINICAL DATA:  Nausea and vomiting EXAM: PORTABLE ABDOMEN - 1 VIEW COMPARISON:  CT abdomen and pelvis March 01, 2010 FINDINGS: Stomach appears moderately distended with air. There is no small or large bowel dilatation or air-fluid level. No free air evident. There is lumbar levoscoliosis with extensive arthropathy. There are multiple foci of vascular calcification in the pelvis. There are surgical clips in the right upper quadrant region. IMPRESSION: Stomach distended with air. No small or large bowel dilatation to suggest bowel obstruction. No free air. Advanced arthropathy and scoliosis in the lumbar region. Multifocal iliac and common femoral artery atherosclerosis. Electronically Signed   By: Bretta Bang III M.D.   On: 08/27/2016 09:12   US Abdomen Limited Ruq  Result Date: 08/25/2016 CLINICAL DATA:  Elevated liver function tests.  EXAM: US ABDOMEN LIMITED - RIGHT UPPER QUADRANT COMPARISON:  CT 03/01/2010. FINDINGS: Gallbladder: Cholecystectomy. Common bile duct: Diameter: 3.0 mm Liver: There is echogenic consistent with fatty infiltration and/or hepatocellular disease. No focal hepatic abnormality identified. Ascites and right pleural effusion noted. IMPRESSION: 1. Cholecystectomy.  No biliary distention. 2. Liver is echogenic consistent fatty infiltration and/or hepatocellular disease. No focal hepatic abnormality identified. 3. Ascites and right pleural effusion noted. Electronically Signed   By: Maisie Fus  Register   On: 08/25/2016 09:19    PHYSICAL EXAM CVP 4-5 General: Elderly appearing. NAD Neck: JVP ~5 cm this am. No thyromegaly or thyroid nodule.  Lungs: Mildly diminished basilar sounds, otherwise stable.   CV: Lateral PMI.  Irregular S1/S2, no S3/S4, no murmur. Trace ankle  edema bilaterally.     Abdomen: Soft, NT, ND, no HSM. No bruits or masses. +BS  Neurologic: Alert and oriented x 3.  Psych: Normal affect. Extremities: No clubbing or cyanosis. Warm   TELEMETRY: Reviewed personally, NSR  ASSESSMENT AND PLAN: 82 yo with history of CAD s/p CABG, ischemic cardiomyopathy, CKD stage III, HTN, DM was admitted with acute on chronic systolic CHF with cardiogenic shock (hypotensive/tachycardic).  1. Cardiogenic shock/acute on chronic systolic CHF: Echo showed EF down to 15% from 45-50% on last echo in 2013.  Ischemic cardiomyopathy.  Has ICD.  Marked hypotension on 1/9, start on norepinephrine and now on milrinone. Co-ox improved with increased milrinone.  Norepinephrine stopped on 1/13.  - Coox stable today at 74.5% on milrinone 0.25. Decrease milrinone to 0.125 mcg and follow coox.  - CVP 4-5. Stop IV lasix.  Give torsemide 40 mg bid.  - Continue digoxin 0.125 mg daily. Check level in am.  - Given age, unlikely to be candidate for advanced therapies.  - Given fall in EF, ideally would have coronary angiography.  Have  held off so far with elevated creatinine.  2. Hyponatremia: As low as 119.  Suspect hypervolemic hyponatremia in setting of CHF.   - Continue fluid restriction.  - No further tolvaptan. Na 131 this am.  3. AKI on CKD stage III:  - Baseline creatinine 1.4.  1.78 this am.  - Continue to follow closely.  Likely cardiorenal component.  4. CAD: s/p CABG.  EF has fallen considerably compared to last echo in 2013.  No recent chest pain.  Troponin mildly and stably elevated at admission, suspect demand ischemia with volume overload rather than ACS.  Concerned about progression of CAD however.  No coronary angiography yet with no ACS and elevated creatinine.  - Continue ASA, holding statin for now with spike in LFTs (will restart as LFTs come down, possibly tomorrow if they continue to fall).  - If creatinine trends down further, may do coronary angiography this admission.  - Agree with current plan. 5. Atrial fibrillation, New.  - Currently in NSR on amio gtt.  - Platelets have been trending down, but back up slightly today. Continue bivalirudin. HIT antibody test pending.  - Will eventually transition to DOAC, not yet as may do cath this week.   - Continue IV amiodarone for now while on milrinone, switch to po when we titrate down.   6. Elevated LFTs: Marked rise in LFTs.  Abdominal US as above.  Suspect shock liver given cardiogenic shock with hypotension initially.   - LFTs trending down.  - Holding statin.  - Will need to use amiodarone for now for rate/rhythm control.  7. ?LV thrombus: - No thrombus on repeat echo.  8. Thrombocytopenia:  - Heparin stopped. ? HIT. HIT panel sent. Now on bival.  Platelets back up slightly.  9. Anemia: - Hgb stable. Fe stores low.  Will give feraheme.   10. Deconditioning - Limited mobility even at baseline. May need SNF at d/c.   Transition to po diuretics and decrease milrinone as above.   Graciella Freer, PA-C  08/30/2016 7:33 AM   Advanced  Heart Failure Team Pager [phone] (M-F; 7a - 4p)  Please contact [facility] for night-coverage after hours (4p -7a ) and weekends on [email or web address]  Patient seen with PA, agree with the above note.  Doing well today, CVP down to 5 with good co-ox.  Creatinine stable.  Stop IV diuretics today, torsemide 40 mg bid.  Decrease milrinone to 0.125, recheck co-ox.  Pending HIT antibody.  Mobilize with PT.   Marca Ancona 08/30/2016 8:05 AM

## 2016-08-31 LAB — CBC
HEMATOCRIT: 32 % — AB (ref 39.0–52.0)
HEMOGLOBIN: 10.7 g/dL — AB (ref 13.0–17.0)
MCH: 27.4 pg (ref 26.0–34.0)
MCHC: 33.4 g/dL (ref 30.0–36.0)
MCV: 82.1 fL (ref 78.0–100.0)
Platelets: 116 10*3/uL — ABNORMAL LOW (ref 150–400)
RBC: 3.9 MIL/uL — ABNORMAL LOW (ref 4.22–5.81)
RDW: 14.2 % (ref 11.5–15.5)
WBC: 5.1 10*3/uL (ref 4.0–10.5)

## 2016-08-31 LAB — COMPREHENSIVE METABOLIC PANEL
ALK PHOS: 163 U/L — AB (ref 38–126)
ALT: 501 U/L — ABNORMAL HIGH (ref 17–63)
AST: 59 U/L — AB (ref 15–41)
Albumin: 3.7 g/dL (ref 3.5–5.0)
Anion gap: 11 (ref 5–15)
BILIRUBIN TOTAL: 1.3 mg/dL — AB (ref 0.3–1.2)
BUN: 24 mg/dL — ABNORMAL HIGH (ref 6–20)
CALCIUM: 9.3 mg/dL (ref 8.9–10.3)
CO2: 29 mmol/L (ref 22–32)
Chloride: 90 mmol/L — ABNORMAL LOW (ref 101–111)
Creatinine, Ser: 1.63 mg/dL — ABNORMAL HIGH (ref 0.61–1.24)
GFR calc Af Amer: 45 mL/min — ABNORMAL LOW (ref >60)
GFR, EST NON AFRICAN AMERICAN: 38 mL/min — AB (ref >60)
Glucose, Bld: 170 mg/dL — ABNORMAL HIGH (ref 65–99)
POTASSIUM: 4.1 mmol/L (ref 3.5–5.1)
Sodium: 130 mmol/L — ABNORMAL LOW (ref 135–145)
TOTAL PROTEIN: 6.5 g/dL (ref 6.5–8.1)

## 2016-08-31 LAB — APTT: APTT: 55 s — AB (ref 24–36)

## 2016-08-31 LAB — GLUCOSE, CAPILLARY
GLUCOSE-CAPILLARY: 186 mg/dL — AB (ref 65–99)
Glucose-Capillary: 191 mg/dL — ABNORMAL HIGH (ref 65–99)
Glucose-Capillary: 246 mg/dL — ABNORMAL HIGH (ref 65–99)
Glucose-Capillary: 268 mg/dL — ABNORMAL HIGH (ref 65–99)

## 2016-08-31 LAB — COOXEMETRY PANEL
CARBOXYHEMOGLOBIN: 1.5 % (ref 0.5–1.5)
Methemoglobin: 1.3 % (ref 0.0–1.5)
O2 Saturation: 60.1 %
TOTAL HEMOGLOBIN: 10.9 g/dL — AB (ref 12.0–16.0)

## 2016-08-31 LAB — DIGOXIN LEVEL: Digoxin Level: 0.9 ng/mL (ref 0.8–2.0)

## 2016-08-31 LAB — HEPARIN INDUCED PLATELET AB (HIT ANTIBODY): Heparin Induced Plt Ab: 0.258 OD (ref 0.000–0.400)

## 2016-08-31 MED ORDER — SODIUM CHLORIDE 0.9 % IV SOLN: 250.0000 mL | INTRAVENOUS | Status: DC | PRN

## 2016-08-31 MED ORDER — SODIUM CHLORIDE 0.9 % IV SOLN
INTRAVENOUS | Status: DC
  Administered 2016-09-01: 01:00:00 via INTRAVENOUS

## 2016-08-31 MED ORDER — ALTEPLASE 2 MG IJ SOLR
2.0000 mg | Freq: Once | INTRAMUSCULAR | Status: AC
  Administered 2016-08-31: 2 mg
  Filled 2016-08-31: qty 2

## 2016-08-31 MED ORDER — SODIUM CHLORIDE 0.9% FLUSH: 3.0000 mL | INTRAVENOUS | Status: DC | PRN

## 2016-08-31 MED ORDER — SODIUM CHLORIDE 0.9 % IV SOLN
0.0600 mg/kg/h | INTRAVENOUS | Status: DC
  Administered 2016-08-31 – 2016-09-01 (×2): 0.055 mg/kg/h via INTRAVENOUS
  Filled 2016-08-31 (×2): qty 250

## 2016-08-31 MED ORDER — AMIODARONE HCL 200 MG PO TABS
400.0000 mg | ORAL_TABLET | Freq: Two times a day (BID) | ORAL | Status: DC
  Administered 2016-08-31 – 2016-09-02 (×5): 400 mg via ORAL
  Filled 2016-08-31 (×5): qty 2

## 2016-08-31 MED ORDER — ATORVASTATIN CALCIUM 10 MG PO TABS
10.0000 mg | ORAL_TABLET | Freq: Every day | ORAL | Status: DC
  Administered 2016-08-31 – 2016-09-02 (×3): 10 mg via ORAL
  Filled 2016-08-31 (×3): qty 1

## 2016-08-31 MED ORDER — SODIUM CHLORIDE 0.9% FLUSH
3.0000 mL | Freq: Two times a day (BID) | INTRAVENOUS | Status: DC
  Administered 2016-09-01: 3 mL via INTRAVENOUS

## 2016-08-31 NOTE — Progress Notes (Signed)
Patient ID: [name], male   DOB: Jan 13, 1935, 82 y.o.   MRN: [id number]   SUBJECTIVE: Patient admitted with acute on chronic systolic CHF.  Developed cardiogenic shock on 1/9 with significant hypotension requiring initiation of norepinephrine.  Poor UOP 1/10 am with rising creatinine and low co-ox.  Milrinone increased to 0.25 and continued on low dose norepinephrine.  Improved co-ox with very good UOP with med adjustment.  Tolvaptan begun for hyponatremia.   CVP 6-7 this am this morning on torsemide 40 mg bid and milrinone 0.125.   Very limited mobility at baseline ( uses a scooter at home.)   Platelets rising now.  Sodium stable.    He is remains in NSR today on amiodarone gtt.   LFTs elevated but trending down. ABD [place] with echogenic liver consistent with fatty liver disease versus hepatocellular disease.   Echo: 08/24/2016 EF 15%, moderate LVH, mild-moderate MR, cannot rule out thrombus.  ECHO: 08/26/2016: EF 10-15% No evidence of thrombus RV mildly dilated.   Scheduled Meds: . aspirin EC  81 mg Oral Daily  . digoxin  0.125 mg Oral Daily  . docusate sodium  100 mg Oral Daily  . ferumoxytol  510 mg Intravenous Weekly  . Influenza vac split quadrivalent PF  0.5 mL Intramuscular Once  . insulin aspart  0-15 Units Subcutaneous TID WC  . pantoprazole  80 mg Oral Q1200  . polyethylene glycol  17 g Oral Daily  . potassium chloride  40 mEq Oral Daily  . sodium chloride flush  10-40 mL Intracatheter Q12H  . spironolactone  12.5 mg Oral Daily  . torsemide  40 mg Oral BID   Continuous Infusions: . amiodarone 30 mg/hr (08/31/16 0600)  . bivalirudin (ANGIOMAX) infusion 0.5 mg/mL (Non-ACS indications) 0.05 mg/kg/hr (08/31/16 0600)   PRN Meds:.sodium chloride, benzonatate, bisacodyl, guaiFENesin-dextromethorphan, LORazepam, menthol-cetylpyridinium, promethazine, sodium chloride flush    Vitals:   08/31/16 0300 08/31/16 0312 08/31/16 0400 08/31/16 0600  BP: (!) 106/92  110/68 99/66    Pulse: 91  95 85  Resp: 17  19 18   Temp:   97.9 F (36.6 C)   TempSrc:   Oral   SpO2: 98%  97% 96%  Weight:  197 lb 5 oz (89.5 kg)    Height:        Intake/Output Summary (Last 24 hours) at 08/31/16 0743 Last data filed at 08/31/16 0600  Gross per 24 hour  Intake           1474.7 ml  Output             2875 ml  Net          -1400.3 ml    LABS: Basic Metabolic Panel:  Recent Labs  09/02/16 0230 08/30/16 0348 08/31/16 0245  NA 125* 131* 130*  K 4.4 4.0 4.1  CL 89* 91* 90*  CO2 26 29 29   GLUCOSE 155* 160* 170*  BUN 24* 21* 24*  CREATININE 1.83* 1.78* 1.63*  CALCIUM 8.7* 9.2 9.3  MG 1.9  --   --    Liver Function Tests:  Recent Labs  08/30/16 0348 08/31/16 0245  AST 92* 59*  ALT 659* 501*  ALKPHOS 178* 163*  BILITOT 1.2 1.3*  PROT 6.5 6.5  ALBUMIN 3.3* 3.7   No results for input(s): LIPASE, AMYLASE in the last 72 hours. CBC:  Recent Labs  08/30/16 0348 08/31/16 0245  WBC 4.7 5.1  HGB 10.6* 10.7*  HCT 30.9* 32.0*  MCV 82.4 82.1  PLT 106* 116*   Cardiac Enzymes: No results for input(s): CKTOTAL, CKMB, CKMBINDEX, TROPONINI in the last 72 hours. BNP: Invalid input(s): POCBNP D-Dimer: No results for input(s): DDIMER in the last 72 hours. Hemoglobin A1C: No results for input(s): HGBA1C in the last 72 hours. Fasting Lipid Panel: No results for input(s): CHOL, HDL, LDLCALC, TRIG, CHOLHDL, LDLDIRECT in the last 72 hours. Thyroid Function Tests: No results for input(s): TSH, T4TOTAL, T3FREE, THYROIDAB in the last 72 hours.  Invalid input(s): FREET3 Anemia Panel:  Recent Labs  08/29/16 0903  FERRITIN 373*  TIBC 347  IRON 44*    RADIOLOGY: Dg Chest Port 1 View  Result Date: 08/24/2016 CLINICAL DATA:  82 y/o  M; central line placement. EXAM: PORTABLE CHEST 1 VIEW COMPARISON:  08/23/2016 chest radiograph FINDINGS: Stable cardiomegaly. Stable configuration of pacemaker. Left central venous catheter tip projects over the mid SVC. Clear lungs. No  pneumothorax or pleural effusion. Post median sternotomy changes. IMPRESSION: No acute cardiopulmonary process. Stable cardiomegaly. Left central venous catheter tip projects over mid SVC. Electronically Signed   By: Mitzi Hansen M.D.   On: 08/24/2016 18:55   Dg Chest Portable 1 View  Result Date: 08/23/2016 CLINICAL DATA:  Hypotension.  Dizziness when standing. EXAM: PORTABLE CHEST 1 VIEW COMPARISON:  One-view chest x-ray 03/01/2010 FINDINGS: Cardiac enlargement is stable. Chronic interstitial coarsening is present without superimposed edema. AICD lead is stable. No focal airspace disease is present. Pacing wires are in place. Visualized soft tissues and bony thorax are unremarkable. IMPRESSION: 1. No acute cardiopulmonary disease. 2. Cardiomegaly without failure. Electronically Signed   By: Marin Roberts M.D.   On: 08/23/2016 15:49   Dg Abd Portable 1v  Result Date: 08/27/2016 CLINICAL DATA:  Nausea and vomiting EXAM: PORTABLE ABDOMEN - 1 VIEW COMPARISON:  CT abdomen and pelvis March 01, 2010 FINDINGS: Stomach appears moderately distended with air. There is no small or large bowel dilatation or air-fluid level. No free air evident. There is lumbar levoscoliosis with extensive arthropathy. There are multiple foci of vascular calcification in the pelvis. There are surgical clips in the right upper quadrant region. IMPRESSION: Stomach distended with air. No small or large bowel dilatation to suggest bowel obstruction. No free air. Advanced arthropathy and scoliosis in the lumbar region. Multifocal iliac and common femoral artery atherosclerosis. Electronically Signed   By: Bretta Bang III M.D.   On: 08/27/2016 09:12   US Abdomen Limited Ruq  Result Date: 08/25/2016 CLINICAL DATA:  Elevated liver function tests. EXAM: US ABDOMEN LIMITED - RIGHT UPPER QUADRANT COMPARISON:  CT 03/01/2010. FINDINGS: Gallbladder: Cholecystectomy. Common bile duct: Diameter: 3.0 mm Liver: There is  echogenic consistent with fatty infiltration and/or hepatocellular disease. No focal hepatic abnormality identified. Ascites and right pleural effusion noted. IMPRESSION: 1. Cholecystectomy.  No biliary distention. 2. Liver is echogenic consistent fatty infiltration and/or hepatocellular disease. No focal hepatic abnormality identified. 3. Ascites and right pleural effusion noted. Electronically Signed   By: Maisie Fus  Register   On: 08/25/2016 09:19    PHYSICAL EXAM CVP 6-7 General: Elderly appearing. NAD Neck: JVP not elevated. No thyromegaly or thyroid nodule.  Lungs: Mildly diminished basilar sounds, otherwise stable.   CV: Lateral PMI.  Irregular S1/S2, no S3/S4, no murmur. 1+ ankle edema bilaterally.     Abdomen: Soft, NT, ND, no HSM. No bruits or masses. +BS  Neurologic: Alert and oriented x 3.  Psych: Normal affect. Extremities: No clubbing or cyanosis. Warm   TELEMETRY: Reviewed personally, NSR  ASSESSMENT AND  PLAN: 82 yo with history of CAD s/p CABG, ischemic cardiomyopathy, CKD stage III, HTN, DM was admitted with acute on chronic systolic CHF with cardiogenic shock (hypotensive/tachycardic).  1. Cardiogenic shock/acute on chronic systolic CHF: Echo showed EF down to 15% from 45-50% on last echo in 2013.  Ischemic cardiomyopathy.  Has ICD.  Marked hypotension on 1/9, start on norepinephrine and now on milrinone. Co-ox improved with increased milrinone.  Norepinephrine stopped on 1/13.  - Stop milrinone today and check co-ox.   - Continue torsemide 40 bid.  - Continue digoxin 0.125 mg daily. Level upper limit of acceptable.  Will need to follow closely.   - Continue spironolactone 12.5 daily.  - Given age, unlikely to be candidate for advanced therapies.  - Given fall in EF, ideally would have coronary angiography.  Will plan for tomorrow if creatinine remains stable.  2. Hyponatremia: As low as 119.  Suspect hypervolemic hyponatremia in setting of CHF.   - Continue fluid  restriction.  3. AKI on CKD stage III: Continue to follow closely.  Likely cardiorenal component. Creatinine has improved, probably near his baseline now.  4. CAD: s/p CABG.  EF has fallen considerably compared to last echo in 2013.  No recent chest pain.  Troponin mildly and stably elevated at admission, suspect demand ischemia with volume overload rather than ACS.  Concerned about progression of CAD however.  No coronary angiography yet with no ACS and elevated creatinine.  - Continue ASA, can restart statin.  - If creatinine stable tomorrow, will plan coronary angiography.  5. Atrial fibrillation: New.  Currently in NSR on amio gtt.  - Platelets have been trending down, but back up today. Continue bivalirudin. HIT antibody test pending.  - Will eventually transition to DOAC, not yet as may do cath this week.   - Transition to po amiodarone today.  6. Elevated LFTs: Marked rise in LFTs.  Abdominal US as above.  Suspect shock liver given cardiogenic shock with hypotension initially.   - LFTs trending down.  - Can restart statin today.   - Will need to use amiodarone for now for rate/rhythm control.  7. ?LV thrombus: No thrombus on repeat echo.  8. Thrombocytopenia: Heparin stopped. ? HIT. HIT panel sent. Now on bival.  Platelets back up slightly.  9. Anemia: Hgb stable. Fe stores low.  Got feraheme.   10. Deconditioning: Limited mobility even at baseline. SNF at d/c.   Marca Ancona, MD  08/31/2016 7:43 AM   Advanced Heart Failure Team Pager [phone] (M-F; 7a - 4p)  Please contact [facility] for night-coverage after hours (4p -7a ) and weekends on [email or web address]

## 2016-08-31 NOTE — Progress Notes (Signed)
ANTICOAGULATION CONSULT NOTE - Follow Up Consult  Pharmacy Consult for Bivalirudin Indication: atrial fibrillation, r/o HIT  No Known Allergies  Patient Measurements: Height: 5\' 10"  (177.8 cm) Weight: 197 lb 5 oz (89.5 kg) IBW/kg (Calculated) : 73   Vital Signs: Temp: 98.7 F (37.1 C) (01/16 1100) Temp Source: Oral (01/16 0700) BP: 110/70 (01/16 1000) Pulse Rate: 99 (01/16 1000)  Labs:  Recent Labs  08/29/16 0230  08/29/16 1955 08/30/16 0348 08/31/16 0245  HGB 9.6*  --   --  10.6* 10.7*  HCT 28.2*  --   --  30.9* 32.0*  PLT 92*  --   --  106* 116*  APTT  --   < > 62* 63* 55*  HEPARINUNFRC 0.40  --   --   --   --   CREATININE 1.83*  --   --  1.78* 1.63*  < > = values in this interval not displayed.  Estimated Creatinine Clearance: 41.4 mL/min (by C-G formula based on SCr of 1.63 mg/dL (H)).  Medications: Bivalirudin @ 0.05mg /kg/hr  Assessment: 81yom initially started on heparin for afib, switched to bivalirudin on 1/14 with concern for HIT. HIT panel in process. APTT remains therapeutic at 55 seconds but trending down. Platelets improving to 116. Noted plan for cath tomorrow if sCr stable and plan for DOAC after all procedures complete.  Goal of Therapy:  APTT 50-85sec Monitor platelets by anticoagulation protocol: Yes   Plan:  1) Increase bivalirudin slightly to 0.055mg /kg/hr 2) Daily aPTT 3) Follow up HIT panel  2/14, PharmD, BCPS 08/31/2016 11:54 AM

## 2016-08-31 NOTE — Progress Notes (Signed)
IV Team was unable to aspirate or flush the blue port.   I was unable to work TPA  Into the blue port.   Dead end cap applied and taped closed.   Primary RN made aware that MD needed to be notified since this clot increases chance of developing a CLABSI.    Distal (brown port) has TPA instilled per policy to dwell.

## 2016-09-01 ENCOUNTER — Encounter (HOSPITAL_COMMUNITY): Payer: Self-pay | Admitting: Cardiology

## 2016-09-01 ENCOUNTER — Encounter (HOSPITAL_COMMUNITY)
Admission: EM | Disposition: A | Discharge status: Skilled Nursing Facility | Payer: Self-pay | Source: Home / Self Care | Attending: Cardiovascular Disease

## 2016-09-01 DIAGNOSIS — R570 Cardiogenic shock: Secondary | ICD-10-CM

## 2016-09-01 DIAGNOSIS — I2510 Atherosclerotic heart disease of native coronary artery without angina pectoris: Secondary | ICD-10-CM

## 2016-09-01 HISTORY — PX: CARDIAC CATHETERIZATION: SHX172

## 2016-09-01 LAB — POCT I-STAT 3, VENOUS BLOOD GAS (G3P V)
ACID-BASE EXCESS: 5 mmol/L — AB (ref 0.0–2.0)
Acid-Base Excess: 3 mmol/L — ABNORMAL HIGH (ref 0.0–2.0)
Bicarbonate: 27.9 mmol/L (ref 20.0–28.0)
Bicarbonate: 30 mmol/L — ABNORMAL HIGH (ref 20.0–28.0)
O2 SAT: 73 %
O2 Saturation: 65 %
PCO2 VEN: 43.8 mmHg — AB (ref 44.0–60.0)
PH VEN: 7.407 (ref 7.250–7.430)
PO2 VEN: 34 mmHg (ref 32.0–45.0)
TCO2: 29 mmol/L (ref 0–100)
TCO2: 31 mmol/L (ref 0–100)
pCO2, Ven: 47.7 mmHg (ref 44.0–60.0)
pH, Ven: 7.413 (ref 7.250–7.430)
pO2, Ven: 39 mmHg (ref 32.0–45.0)

## 2016-09-01 LAB — COMPREHENSIVE METABOLIC PANEL
ALBUMIN: 3.5 g/dL (ref 3.5–5.0)
ALT: 341 U/L — AB (ref 17–63)
AST: 40 U/L (ref 15–41)
Alkaline Phosphatase: 155 U/L — ABNORMAL HIGH (ref 38–126)
Anion gap: 11 (ref 5–15)
BUN: 24 mg/dL — ABNORMAL HIGH (ref 6–20)
CHLORIDE: 91 mmol/L — AB (ref 101–111)
CO2: 28 mmol/L (ref 22–32)
CREATININE: 1.71 mg/dL — AB (ref 0.61–1.24)
Calcium: 9.1 mg/dL (ref 8.9–10.3)
GFR calc non Af Amer: 36 mL/min — ABNORMAL LOW (ref >60)
GFR, EST AFRICAN AMERICAN: 42 mL/min — AB (ref >60)
GLUCOSE: 179 mg/dL — AB (ref 65–99)
Potassium: 4.1 mmol/L (ref 3.5–5.1)
SODIUM: 130 mmol/L — AB (ref 135–145)
Total Bilirubin: 1.2 mg/dL (ref 0.3–1.2)
Total Protein: 6.7 g/dL (ref 6.5–8.1)

## 2016-09-01 LAB — GLUCOSE, CAPILLARY
GLUCOSE-CAPILLARY: 181 mg/dL — AB (ref 65–99)
GLUCOSE-CAPILLARY: 218 mg/dL — AB (ref 65–99)
GLUCOSE-CAPILLARY: 206 mg/dL — AB (ref 65–99)
Glucose-Capillary: 191 mg/dL — ABNORMAL HIGH (ref 65–99)
Glucose-Capillary: 216 mg/dL — ABNORMAL HIGH (ref 65–99)

## 2016-09-01 LAB — POCT I-STAT 3, ART BLOOD GAS (G3+)
Acid-Base Excess: 3 mmol/L — ABNORMAL HIGH (ref 0.0–2.0)
Bicarbonate: 26.8 mmol/L (ref 20.0–28.0)
O2 Saturation: 98 %
PH ART: 7.441 (ref 7.350–7.450)
TCO2: 28 mmol/L (ref 0–100)
pCO2 arterial: 39.3 mmHg (ref 32.0–48.0)
pO2, Arterial: 97 mmHg (ref 83.0–108.0)

## 2016-09-01 LAB — COOXEMETRY PANEL
Carboxyhemoglobin: 1.5 % (ref 0.5–1.5)
METHEMOGLOBIN: 1.2 % (ref 0.0–1.5)
O2 Saturation: 60.9 %
TOTAL HEMOGLOBIN: 11.2 g/dL — AB (ref 12.0–16.0)

## 2016-09-01 LAB — CBC
HCT: 31.9 % — ABNORMAL LOW (ref 39.0–52.0)
Hemoglobin: 10.8 g/dL — ABNORMAL LOW (ref 13.0–17.0)
MCH: 28.1 pg (ref 26.0–34.0)
MCHC: 33.9 g/dL (ref 30.0–36.0)
MCV: 82.9 fL (ref 78.0–100.0)
PLATELETS: 126 10*3/uL — AB (ref 150–400)
RBC: 3.85 MIL/uL — AB (ref 4.22–5.81)
RDW: 14.6 % (ref 11.5–15.5)
WBC: 5 10*3/uL (ref 4.0–10.5)

## 2016-09-01 LAB — PROTIME-INR
INR: 1.6
PROTHROMBIN TIME: 19.2 s — AB (ref 11.4–15.2)

## 2016-09-01 LAB — APTT: aPTT: 53 seconds — ABNORMAL HIGH (ref 24–36)

## 2016-09-01 LAB — POCT ACTIVATED CLOTTING TIME: ACTIVATED CLOTTING TIME: 136 s

## 2016-09-01 SURGERY — RIGHT/LEFT HEART CATH AND CORONARY/GRAFT ANGIOGRAPHY

## 2016-09-01 MED ORDER — ACETAMINOPHEN 325 MG PO TABS: 650.0000 mg | ORAL_TABLET | ORAL | Status: DC | PRN

## 2016-09-01 MED ORDER — IOPAMIDOL (ISOVUE-370) INJECTION 76%
INTRAVENOUS | Status: AC
  Filled 2016-09-01: qty 125

## 2016-09-01 MED ORDER — LIDOCAINE HCL (PF) 1 % IJ SOLN
INTRAMUSCULAR | Status: AC
  Filled 2016-09-01: qty 30

## 2016-09-01 MED ORDER — TORSEMIDE 20 MG PO TABS: 40.0000 mg | ORAL_TABLET | Freq: Every day | ORAL | Status: DC

## 2016-09-01 MED ORDER — SODIUM CHLORIDE 0.9% FLUSH
3.0000 mL | Freq: Two times a day (BID) | INTRAVENOUS | Status: DC
  Administered 2016-09-01 – 2016-09-02 (×2): 3 mL via INTRAVENOUS

## 2016-09-01 MED ORDER — FENTANYL CITRATE (PF) 100 MCG/2ML IJ SOLN
INTRAMUSCULAR | Status: AC
  Filled 2016-09-01: qty 2

## 2016-09-01 MED ORDER — SODIUM CHLORIDE 0.9 % IV SOLN: INTRAVENOUS | Status: AC

## 2016-09-01 MED ORDER — MIDAZOLAM HCL 2 MG/2ML IJ SOLN
INTRAMUSCULAR | Status: DC | PRN
  Administered 2016-09-01: 1 mg via INTRAVENOUS

## 2016-09-01 MED ORDER — MIDAZOLAM HCL 2 MG/2ML IJ SOLN
INTRAMUSCULAR | Status: AC
  Filled 2016-09-01: qty 2

## 2016-09-01 MED ORDER — IOPAMIDOL (ISOVUE-370) INJECTION 76%
INTRAVENOUS | Status: DC | PRN
  Administered 2016-09-01: 30 mL via INTRA_ARTERIAL

## 2016-09-01 MED ORDER — FENTANYL CITRATE (PF) 100 MCG/2ML IJ SOLN
INTRAMUSCULAR | Status: DC | PRN
  Administered 2016-09-01: 25 ug via INTRAVENOUS

## 2016-09-01 MED ORDER — SODIUM CHLORIDE 0.9% FLUSH: 3.0000 mL | INTRAVENOUS | Status: DC | PRN

## 2016-09-01 MED ORDER — APIXABAN 5 MG PO TABS
5.0000 mg | ORAL_TABLET | Freq: Two times a day (BID) | ORAL | Status: DC
  Administered 2016-09-01 – 2016-09-02 (×2): 5 mg via ORAL
  Filled 2016-09-01 (×3): qty 1

## 2016-09-01 MED ORDER — SODIUM CHLORIDE 0.9 % IV SOLN: 250.0000 mL | INTRAVENOUS | Status: DC | PRN

## 2016-09-01 MED ORDER — ONDANSETRON HCL 4 MG/2ML IJ SOLN: 4.0000 mg | Freq: Four times a day (QID) | INTRAMUSCULAR | Status: DC | PRN

## 2016-09-01 MED ORDER — LIDOCAINE HCL (PF) 1 % IJ SOLN
INTRAMUSCULAR | Status: DC | PRN
  Administered 2016-09-01: 30 mL via SUBCUTANEOUS

## 2016-09-01 SURGICAL SUPPLY — 9 items
CATH INFINITI 5FR MULTPACK ANG (CATHETERS) ×2 IMPLANT
CATH SWAN GANZ 7F STRAIGHT (CATHETERS) ×2 IMPLANT
KIT HEART LEFT (KITS) ×3 IMPLANT
KIT HEART RIGHT NAMIC (KITS) ×2 IMPLANT
PACK CARDIAC CATHETERIZATION (CUSTOM PROCEDURE TRAY) ×3 IMPLANT
SHEATH PINNACLE 5F 10CM (SHEATH) ×2 IMPLANT
SHEATH PINNACLE 7F 10CM (SHEATH) ×2 IMPLANT
TRANSDUCER W/STOPCOCK (MISCELLANEOUS) ×3 IMPLANT
WIRE EMERALD 3MM-J .035X150CM (WIRE) ×2 IMPLANT

## 2016-09-01 NOTE — Progress Notes (Signed)
ANTICOAGULATION CONSULT NOTE - Follow Up Consult  Pharmacy Consult for Bivalirudin Indication: atrial fibrillation, r/o HIT  No Known Allergies  Patient Measurements: Height: 5\' 10"  (177.8 cm) Weight: 192 lb 14.4 oz (87.5 kg) IBW/kg (Calculated) : 73   Vital Signs: Temp: 98 F (36.7 C) (01/17 0400) Temp Source: Oral (01/17 0400) BP: 108/68 (01/17 0700) Pulse Rate: 77 (01/17 0700)  Labs:  Recent Labs  08/30/16 0348 08/31/16 0245 09/01/16 0413  HGB 10.6* 10.7* 10.8*  HCT 30.9* 32.0* 31.9*  PLT 106* 116* 126*  APTT 63* 55* 53*  LABPROT  --   --  19.2*  INR  --   --  1.60  CREATININE 1.78* 1.63* 1.71*    Estimated Creatinine Clearance: 36.2 mL/min (by C-G formula based on SCr of 1.71 mg/dL (H)).  Medications: Bivalirudin @ 0.055mg /kg/hr  Assessment: 81yom initially started on heparin for afib, switched to bivalirudin on 1/14 with concern for HIT. HIT panel negative. APTT remains therapeutic at 53 seconds. Platelets improving to 126. Plan for cath today and then will switch to eliquis after cath per Dr. 2/14.  Goal of Therapy:  APTT 50-85sec Monitor platelets by anticoagulation protocol: Yes   Plan:  1) Increase bivalirudin to 0.06mg /kg/hr prior to cath 2) Follow up after cath for eliquis initiation  Shirlee Latch, PharmD, BCPS 09/01/2016 8:33 AM

## 2016-09-01 NOTE — Progress Notes (Signed)
Patient ID: [name], male   DOB: [birth year], 82 y.o.   MRN: [id number]   SUBJECTIVE: Patient admitted with acute on chronic systolic CHF.  Developed cardiogenic shock on 1/9 with significant hypotension requiring initiation of norepinephrine.  Poor UOP 1/10 am with rising creatinine and low co-ox.  Milrinone increased to 0.25 and continued on low dose norepinephrine.  Improved co-ox with very good UOP with med adjustment.  Tolvaptan begun for hyponatremia.   CVP 5 this am this morning on torsemide 40 mg bid, co-ox 61% off milrinone.   Very limited mobility at baseline ( uses a scooter at home.)   Platelets rising now.  HIT negative.  Sodium stable.    He is remains in NSR today on amiodarone po.   LFTs elevated but trending down. ABD [place] with echogenic liver consistent with fatty liver disease versus hepatocellular disease.   Echo: 08/24/2016 EF 15%, moderate LVH, mild-moderate MR, cannot rule out thrombus.  ECHO: 08/26/2016: EF 10-15% No evidence of thrombus RV mildly dilated.   Scheduled Meds: . amiodarone  400 mg Oral BID  . aspirin EC  81 mg Oral Daily  . atorvastatin  10 mg Oral q1800  . digoxin  0.125 mg Oral Daily  . docusate sodium  100 mg Oral Daily  . ferumoxytol  510 mg Intravenous Weekly  . insulin aspart  0-15 Units Subcutaneous TID WC  . pantoprazole  80 mg Oral Q1200  . polyethylene glycol  17 g Oral Daily  . potassium chloride  40 mEq Oral Daily  . sodium chloride flush  10-40 mL Intracatheter Q12H  . sodium chloride flush  3 mL Intravenous Q12H  . spironolactone  12.5 mg Oral Daily  . [START ON 09/02/2016] torsemide  40 mg Oral Daily   Continuous Infusions: . sodium chloride 10 mL/hr at 09/01/16 0035   PRN Meds:.sodium chloride, sodium chloride, benzonatate, bisacodyl, guaiFENesin-dextromethorphan, LORazepam, menthol-cetylpyridinium, promethazine, sodium chloride flush, sodium chloride flush    Vitals:   09/01/16 0430 09/01/16 0500 09/01/16 0600 09/01/16 0700   BP:  126/84 97/61 108/68  Pulse:  76 78 77  Resp:  19 13 16   Temp:      TempSrc:      SpO2:  99% 93% 96%  Weight: 192 lb 14.4 oz (87.5 kg)     Height:        Intake/Output Summary (Last 24 hours) at 09/01/16 0811 Last data filed at 09/01/16 0600  Gross per 24 hour  Intake           671.22 ml  Output             3175 ml  Net         -2503.78 ml    LABS: Basic Metabolic Panel:  Recent Labs  69/62/95 0245 09/01/16 0413  NA 130* 130*  K 4.1 4.1  CL 90* 91*  CO2 29 28  GLUCOSE 170* 179*  BUN 24* 24*  CREATININE 1.63* 1.71*  CALCIUM 9.3 9.1   Liver Function Tests:  Recent Labs  08/31/16 0245 09/01/16 0413  AST 59* 40  ALT 501* 341*  ALKPHOS 163* 155*  BILITOT 1.3* 1.2  PROT 6.5 6.7  ALBUMIN 3.7 3.5   No results for input(s): LIPASE, AMYLASE in the last 72 hours. CBC:  Recent Labs  08/31/16 0245 09/01/16 0413  WBC 5.1 5.0  HGB 10.7* 10.8*  HCT 32.0* 31.9*  MCV 82.1 82.9  PLT 116* 126*   Cardiac Enzymes: No results for  input(s): CKTOTAL, CKMB, CKMBINDEX, TROPONINI in the last 72 hours. BNP: Invalid input(s): POCBNP D-Dimer: No results for input(s): DDIMER in the last 72 hours. Hemoglobin A1C: No results for input(s): HGBA1C in the last 72 hours. Fasting Lipid Panel: No results for input(s): CHOL, HDL, LDLCALC, TRIG, CHOLHDL, LDLDIRECT in the last 72 hours. Thyroid Function Tests: No results for input(s): TSH, T4TOTAL, T3FREE, THYROIDAB in the last 72 hours.  Invalid input(s): FREET3 Anemia Panel:  Recent Labs  08/29/16 0903  FERRITIN 373*  TIBC 347  IRON 44*    RADIOLOGY: Dg Chest Port 1 View  Result Date: 08/24/2016 CLINICAL DATA:  82 y/o  M; central line placement. EXAM: PORTABLE CHEST 1 VIEW COMPARISON:  08/23/2016 chest radiograph FINDINGS: Stable cardiomegaly. Stable configuration of pacemaker. Left central venous catheter tip projects over the mid SVC. Clear lungs. No pneumothorax or pleural effusion. Post median sternotomy  changes. IMPRESSION: No acute cardiopulmonary process. Stable cardiomegaly. Left central venous catheter tip projects over mid SVC. Electronically Signed   By: Mitzi Hansen M.D.   On: 08/24/2016 18:55   Dg Chest Portable 1 View  Result Date: 08/23/2016 CLINICAL DATA:  Hypotension.  Dizziness when standing. EXAM: PORTABLE CHEST 1 VIEW COMPARISON:  One-view chest x-ray 03/01/2010 FINDINGS: Cardiac enlargement is stable. Chronic interstitial coarsening is present without superimposed edema. AICD lead is stable. No focal airspace disease is present. Pacing wires are in place. Visualized soft tissues and bony thorax are unremarkable. IMPRESSION: 1. No acute cardiopulmonary disease. 2. Cardiomegaly without failure. Electronically Signed   By: Marin Roberts M.D.   On: 08/23/2016 15:49   Dg Abd Portable 1v  Result Date: 08/27/2016 CLINICAL DATA:  Nausea and vomiting EXAM: PORTABLE ABDOMEN - 1 VIEW COMPARISON:  CT abdomen and pelvis March 01, 2010 FINDINGS: Stomach appears moderately distended with air. There is no small or large bowel dilatation or air-fluid level. No free air evident. There is lumbar levoscoliosis with extensive arthropathy. There are multiple foci of vascular calcification in the pelvis. There are surgical clips in the right upper quadrant region. IMPRESSION: Stomach distended with air. No small or large bowel dilatation to suggest bowel obstruction. No free air. Advanced arthropathy and scoliosis in the lumbar region. Multifocal iliac and common femoral artery atherosclerosis. Electronically Signed   By: Bretta Bang III M.D.   On: 08/27/2016 09:12   US Abdomen Limited Ruq  Result Date: 08/25/2016 CLINICAL DATA:  Elevated liver function tests. EXAM: US ABDOMEN LIMITED - RIGHT UPPER QUADRANT COMPARISON:  CT 03/01/2010. FINDINGS: Gallbladder: Cholecystectomy. Common bile duct: Diameter: 3.0 mm Liver: There is echogenic consistent with fatty infiltration and/or  hepatocellular disease. No focal hepatic abnormality identified. Ascites and right pleural effusion noted. IMPRESSION: 1. Cholecystectomy.  No biliary distention. 2. Liver is echogenic consistent fatty infiltration and/or hepatocellular disease. No focal hepatic abnormality identified. 3. Ascites and right pleural effusion noted. Electronically Signed   By: Maisie Fus  Register   On: 08/25/2016 09:19    PHYSICAL EXAM CVP 6-7 General: Elderly appearing. NAD Neck: JVP not elevated. No thyromegaly or thyroid nodule.  Lungs: Mildly diminished basilar sounds, otherwise stable.   CV: Lateral PMI.  Irregular S1/S2, no S3/S4, no murmur. 1+ ankle edema bilaterally.     Abdomen: Soft, NT, ND, no HSM. No bruits or masses. +BS  Neurologic: Alert and oriented x 3.  Psych: Normal affect. Extremities: No clubbing or cyanosis. Warm   TELEMETRY: Reviewed personally, NSR  ASSESSMENT AND PLAN: 82 yo with history of CAD s/p CABG, ischemic  cardiomyopathy, CKD stage III, HTN, DM was admitted with acute on chronic systolic CHF with cardiogenic shock (hypotensive/tachycardic).  1. Cardiogenic shock/acute on chronic systolic CHF: Echo showed EF down to 15% from 45-50% on last echo in 2013.  Ischemic cardiomyopathy.  Has ICD.  Marked hypotension on 1/9, start on norepinephrine and now on milrinone. Co-ox improved with increased milrinone.  Norepinephrine stopped on 1/13. Milrinone stopped 1/16, co-ox 61% today. CVP 5.  - CVP now 5, decrease torsemide to once daily (may need more long-term but will follow for now).  - Continue digoxin 0.125 mg daily. Level upper limit of acceptable.  Will need to follow closely (repeat tomorrow).    - Continue spironolactone 12.5 daily.  - Given age, unlikely to be candidate for advanced therapies.  - Given fall in EF, will do coronary angiography today now that BUN/creatinine have stabilized.  We discussed risks/benefits of procedure today and he agrees to proceed.  2. Hyponatremia: As  low as 119.  Suspect hypervolemic hyponatremia in setting of CHF.   - Continue fluid restriction.  3. AKI on CKD stage III: Continue to follow closely.  Likely cardiorenal component. Creatinine has improved, probably near his baseline now.  4. CAD: s/p CABG.  EF has fallen considerably compared to last echo in 2013.  No recent chest pain.  Troponin mildly and stably elevated at admission, suspect demand ischemia with volume overload rather than ACS.  Concerned about progression of CAD however.  No coronary angiography yet with no ACS and elevated creatinine.  - Continue ASA, can restart statin.  - If creatinine stable tomorrow, will plan coronary angiography.  5. Atrial fibrillation: New.  Now in NSR on po amiodarone.  After cath, will start Eliquis.  6. Elevated LFTs: Marked rise in LFTs.  Abdominal US as above.  Suspect shock liver given cardiogenic shock with hypotension initially.   - LFTs trending down.  - Statin restarted.  7. ?LV thrombus: No thrombus on repeat echo.  8. Thrombocytopenia: Heparin stopped/bivalirudin started. HIT negative.    9. Anemia: Hgb stable. Fe stores low.  Got feraheme.   10. Deconditioning: Limited mobility even at baseline. SNF at d/c.   Marca Ancona, MD  09/01/2016 8:11 AM   Advanced Heart Failure Team Pager [phone] (M-F; 7a - 4p)  Please contact [facility] for night-coverage after hours (4p -7a ) and weekends on [email or web address]

## 2016-09-01 NOTE — Progress Notes (Signed)
PT Cancellation Note  Patient Details Name: [name] MRN: [id number] DOB: April 02, 1935   Cancelled Treatment:    Reason Eval/Treat Not Completed: Patient at procedure or test/unavailable. Pt off of the floor to the cath lab. PT will continue to f/u with pt as appropriate.   Alessandra Bevels Casin Federici 09/01/2016, 9:32 AM

## 2016-09-01 NOTE — Progress Notes (Signed)
Site area: right groin Site Prior to Removal:  Level 0 Pressure Applied For:  20 minutes Manual:   yes Patient Status During Pull:  stable Post Pull Site:  Level  0 Post Pull Instructions Given:  yes Post Pull Pulses Present: yes Dressing Applied:  yes Bedrest begins @ 1035 Comments:

## 2016-09-02 ENCOUNTER — Encounter: Payer: Medicare Other | Admitting: *Deleted

## 2016-09-02 DIAGNOSIS — E119 Type 2 diabetes mellitus without complications: Secondary | ICD-10-CM

## 2016-09-02 LAB — COOXEMETRY PANEL
CARBOXYHEMOGLOBIN: 1.7 % — AB (ref 0.5–1.5)
METHEMOGLOBIN: 1.2 % (ref 0.0–1.5)
O2 SAT: 53.5 %
Total hemoglobin: 10.6 g/dL — ABNORMAL LOW (ref 12.0–16.0)

## 2016-09-02 LAB — BASIC METABOLIC PANEL
ANION GAP: 10 (ref 5–15)
BUN: 28 mg/dL — AB (ref 6–20)
CHLORIDE: 90 mmol/L — AB (ref 101–111)
CO2: 28 mmol/L (ref 22–32)
Calcium: 9.2 mg/dL (ref 8.9–10.3)
Creatinine, Ser: 1.7 mg/dL — ABNORMAL HIGH (ref 0.61–1.24)
GFR calc Af Amer: 42 mL/min — ABNORMAL LOW (ref >60)
GFR calc non Af Amer: 37 mL/min — ABNORMAL LOW (ref >60)
GLUCOSE: 192 mg/dL — AB (ref 65–99)
POTASSIUM: 4.5 mmol/L (ref 3.5–5.1)
Sodium: 128 mmol/L — ABNORMAL LOW (ref 135–145)

## 2016-09-02 LAB — CBC
HCT: 30.4 % — ABNORMAL LOW (ref 39.0–52.0)
HEMOGLOBIN: 10.2 g/dL — AB (ref 13.0–17.0)
MCH: 28 pg (ref 26.0–34.0)
MCHC: 33.6 g/dL (ref 30.0–36.0)
MCV: 83.5 fL (ref 78.0–100.0)
Platelets: 124 10*3/uL — ABNORMAL LOW (ref 150–400)
RBC: 3.64 MIL/uL — AB (ref 4.22–5.81)
RDW: 14.2 % (ref 11.5–15.5)
WBC: 4.9 10*3/uL (ref 4.0–10.5)

## 2016-09-02 LAB — GLUCOSE, CAPILLARY
GLUCOSE-CAPILLARY: 282 mg/dL — AB (ref 65–99)
GLUCOSE-CAPILLARY: 304 mg/dL — AB (ref 65–99)

## 2016-09-02 LAB — APTT: APTT: 34 s (ref 24–36)

## 2016-09-02 MED ORDER — APIXABAN 5 MG PO TABS: 5.0000 mg | ORAL_TABLET | Freq: Two times a day (BID) | ORAL | 6 refills | Status: DC

## 2016-09-02 MED ORDER — HYDROCORTISONE 1 % EX CREA: TOPICAL_CREAM | CUTANEOUS | 0 refills | Status: DC | PRN

## 2016-09-02 MED ORDER — POTASSIUM CHLORIDE CRYS ER 20 MEQ PO TBCR: 40.0000 meq | EXTENDED_RELEASE_TABLET | Freq: Every day | ORAL | 6 refills | Status: DC

## 2016-09-02 MED ORDER — AMIODARONE HCL 400 MG PO TABS: 400.0000 mg | ORAL_TABLET | Freq: Two times a day (BID) | ORAL | 6 refills | Status: DC

## 2016-09-02 MED ORDER — MENTHOL 3 MG MT LOZG: 1.0000 | LOZENGE | OROMUCOSAL | 12 refills | Status: DC | PRN

## 2016-09-02 MED ORDER — TORSEMIDE 20 MG PO TABS
40.0000 mg | ORAL_TABLET | Freq: Two times a day (BID) | ORAL | Status: DC
  Administered 2016-09-02 (×2): 40 mg via ORAL
  Filled 2016-09-02 (×2): qty 2

## 2016-09-02 MED ORDER — INSULIN ASPART 100 UNIT/ML ~~LOC~~ SOLN: 0.0000 [IU] | Freq: Three times a day (TID) | SUBCUTANEOUS | 11 refills | Status: DC

## 2016-09-02 MED ORDER — GUAIFENESIN-DM 100-10 MG/5ML PO SYRP: 5.0000 mL | ORAL_SOLUTION | ORAL | 0 refills | Status: DC | PRN

## 2016-09-02 MED ORDER — HYDROCORTISONE 1 % EX CREA
TOPICAL_CREAM | CUTANEOUS | Status: DC | PRN
  Filled 2016-09-02: qty 28

## 2016-09-02 MED ORDER — LORAZEPAM 0.5 MG PO TABS: 0.5000 mg | ORAL_TABLET | Freq: Four times a day (QID) | ORAL | 0 refills | Status: DC | PRN

## 2016-09-02 MED ORDER — POLYETHYLENE GLYCOL 3350 17 G PO PACK: 17.0000 g | PACK | Freq: Every day | ORAL | 0 refills | Status: DC

## 2016-09-02 MED ORDER — DIGOXIN 125 MCG PO TABS: 0.1250 mg | ORAL_TABLET | Freq: Every day | ORAL | 6 refills | Status: DC

## 2016-09-02 MED ORDER — DOCUSATE SODIUM 100 MG PO CAPS: 100.0000 mg | ORAL_CAPSULE | Freq: Every day | ORAL | 0 refills | Status: DC

## 2016-09-02 MED ORDER — BISACODYL 5 MG PO TBEC: 5.0000 mg | DELAYED_RELEASE_TABLET | Freq: Every day | ORAL | 0 refills | Status: DC | PRN

## 2016-09-02 MED ORDER — TORSEMIDE 20 MG PO TABS: 40.0000 mg | ORAL_TABLET | Freq: Two times a day (BID) | ORAL | 6 refills | Status: DC

## 2016-09-02 MED ORDER — BENZONATATE 100 MG PO CAPS: 100.0000 mg | ORAL_CAPSULE | Freq: Three times a day (TID) | ORAL | 0 refills | Status: DC | PRN

## 2016-09-02 NOTE — Clinical Social Work Placement (Signed)
   CLINICAL SOCIAL WORK PLACEMENT  NOTE  Date:  09/02/2016  Patient Details  Name: DEWARREN LEDBETTER MRN: [id number] Date of Birth: November 28, 1934  Clinical Social Work is seeking post-discharge placement for this patient at the Skilled  Nursing Facility level of care (*CSW will initial, date and re-position this form in  chart as items are completed):  Yes   Patient/family provided with [place] Clinical Social Work Department's list of facilities offering this level of care within the geographic area requested by the patient (or if unable, by the patient's family).  Yes   Patient/family informed of their freedom to choose among providers that offer the needed level of care, that participate in Medicare, Medicaid or managed care program needed by the patient, have an available bed and are willing to accept the patient.  Yes   Patient/family informed of [facility]'s ownership interest in [facility] and [facility], as well as of the fact that they are under no obligation to receive care at these facilities.  PASRR submitted to EDS on 09/02/16     PASRR number received on 09/02/16     Existing PASRR number confirmed on       FL2 transmitted to all facilities in geographic area requested by pt/family on 09/02/16     FL2 transmitted to all facilities within larger geographic area on       Patient informed that his/her managed care company has contracts with or will negotiate with certain facilities, including the following:            Patient/family informed of bed offers received.  Patient chooses bed at       Physician recommends and patient chooses bed at      Patient to be transferred to   on  .  Patient to be transferred to facility by       Patient family notified on   of transfer.  Name of family member notified:        PHYSICIAN Please sign FL2     Additional Comment:    _______________________________________________ Raye Sorrow, LCSW 09/02/2016, 11:02  AM

## 2016-09-02 NOTE — Progress Notes (Signed)
Physical Therapy Treatment Patient Details Name: [name] MRN: [id number] DOB: 1935/05/14 Today's Date: 09-30-16    History of Present Illness Pt adm with acute on chronic chf. Developed cardiogenic shock on 1/9 with significant hypotension. PMH - CABG, cardiomyopathy, DM, HTN, arthritis, ICD    PT Comments    Pt continues to make steady progress. Continue to feel pt will do well at SNF and eventually be able to return home.  Follow Up Recommendations  SNF     Equipment Recommendations  None recommended by PT    Recommendations for Other Services       Precautions / Restrictions Precautions Precautions: Fall Restrictions Weight Bearing Restrictions: No    Mobility  Bed Mobility Overal bed mobility: Needs Assistance Bed Mobility: Supine to Sit     Supine to sit: Mod assist     General bed mobility comments: Assist to elevate trunk into sitting  Transfers Overall transfer level: Needs assistance Equipment used: Ambulation equipment used Transfers: Sit to/from Stand Sit to Stand: +2 physical assistance;Mod assist         General transfer comment: Assist to bring hips and trunk up. Pt tends to rise with hips coming up but trunk collapsing forward onto support of forearms on Stedy. Verbal/tactile cues and manual faciliation to stand more erect. Used Stedy to pivot to chair. Stood x 2 for 1-2 minutes.   Ambulation/Gait                 Stairs            Wheelchair Mobility    Modified Rankin (Stroke Patients Only)       Balance Overall balance assessment: Needs assistance Sitting-balance support: No upper extremity supported;Feet supported Sitting balance-Leahy Scale: Fair     Standing balance support: Bilateral upper extremity supported;During functional activity Standing balance-Leahy Scale: Poor Standing balance comment: Static standing in Stedy with mod to min A for static standing.                    Cognition  Arousal/Alertness: Awake/alert Behavior During Therapy: WFL for tasks assessed/performed Overall Cognitive Status: Within Functional Limits for tasks assessed                      Exercises General Exercises - Lower Extremity Long Arc Quad: AROM;Both;10 reps;Seated Hip Flexion/Marching: AROM;Strengthening;Both;10 reps;Seated    General Comments        Pertinent Vitals/Pain Pain Assessment: No/denies pain    Home Living                      Prior Function            PT Goals (current goals can now be found in the care plan section) Progress towards PT goals: Progressing toward goals    Frequency    Min 3X/week      PT Plan Current plan remains appropriate    Co-evaluation             End of Session Equipment Utilized During Treatment: Gait belt Activity Tolerance: Patient tolerated treatment well Patient left: in chair;with call bell/phone within reach     Time: 4332-9518 PT Time Calculation (min) (ACUTE ONLY): 21 min  Charges:  $Therapeutic Exercise: 8-22 mins                    G Codes:      Angelina Ok [facility] 2016/09/30, 1:32 PM [employer] PT  [phone]   

## 2016-09-02 NOTE — Progress Notes (Signed)
Pt transferred to SNF. Discharge instructions explained and given to pt. PIV removed at 2000. Clothes, cellphone and personal bag sent with pt.

## 2016-09-02 NOTE — Discharge Instructions (Addendum)
Heart Healthy low salt diabetic diet  Weigh daily Call Heart failure clinicif weight climbs more than 3 pounds in a day or 5 pounds in a week. No salt to very little salt in your diet.  No more than 2000 mg in a day. Call if increased shortness of breath or increased swelling.  Needs BMP and CBC and Dig level 09/03/16.    Call [facility] at [phone] if any bleeding, swelling or drainage at cath site.  May shower, no tub baths for 48 hours for groin sticks. No lifting over 5 pounds for 3 days.      Information on my medicine - ELIQUIS (apixaban)  This medication education was reviewed with me or my healthcare representative as part of my discharge preparation.  The pharmacist that spoke with me during my hospital stay was:  Fredrik Rigger, [facility]  Why was Eliquis prescribed for you? Eliquis was prescribed for you to reduce the risk of a blood clot forming that can cause a stroke if you have a medical condition called atrial fibrillation (a type of irregular heartbeat).  What do You need to know about Eliquis ? Take your Eliquis TWICE DAILY - one tablet in the morning and one tablet in the evening with or without food. If you have difficulty swallowing the tablet whole please discuss with your pharmacist how to take the medication safely.  Take Eliquis exactly as prescribed by your doctor and DO NOT stop taking Eliquis without talking to the doctor who prescribed the medication.  Stopping may increase your risk of developing a stroke.  Refill your prescription before you run out.  After discharge, you should have regular check-up appointments with your healthcare provider that is prescribing your Eliquis.  In the future your dose may need to be changed if your kidney function or weight changes by a significant amount or as you get older.  What do you do if you miss a dose? If you miss a dose, take it as soon as you remember on the same day and resume  taking twice daily.  Do not take more than one dose of ELIQUIS at the same time to make up a missed dose.  Important Safety Information A possible side effect of Eliquis is bleeding. You should call your healthcare provider right away if you experience any of the following: ? Bleeding from an injury or your nose that does not stop. ? Unusual colored urine (red or dark brown) or unusual colored stools (red or black). ? Unusual bruising for unknown reasons. ? A serious fall or if you hit your head (even if there is no bleeding).  Some medicines may interact with Eliquis and might increase your risk of bleeding or clotting while on Eliquis. To help avoid this, consult your healthcare provider or pharmacist prior to using any new prescription or non-prescription medications, including herbals, vitamins, non-steroidal anti-inflammatory drugs (NSAIDs) and supplements.  This website has more information on Eliquis (apixaban): http://www.eliquis.com/eliquis/home

## 2016-09-02 NOTE — Discharge Summary (Signed)
Discharge Summary    Patient ID: [name],  MRN: [id number], DOB/AGE: 12-24-1934 82 y.o.  Admit date: 08/23/2016 Discharge date: 09/02/2016  Primary Care Provider: Lucila Maine Primary Cardiologist: Dr. [name]  CHF:  [name]   Discharge Diagnoses    Principal Problem:   Hypotension Active Problems:   Cardiomyopathy, ischemic   Cardiogenic shock (HCC)   Chronic systolic heart failure (HCC)   Hyperlipidemia LDL goal <70   Hx of CABG   Rheumatoid arthritis (HCC)   Ventricular tachycardia-treated the ATP   Single implantable cardioverter-defibrillator BSX    Acute on chronic combined systolic and diastolic congestive heart failure (HCC)   Acute renal failure (HCC)   Atrial fibrillation with rapid ventricular response (HCC)   Diabetes (HCC)   Allergies No Known Allergies  Diagnostic Studies/Procedures   LIMITED ECHO 1/11/8 Study Conclusions  - Left ventricle: The cavity size was moderately dilated. Wall   thickness was increased in a pattern of mild LVH. Systolic   function was severely reduced. The estimated ejection fraction   was in the range of 10% to 15%. Severe diffuse hypokinesis with   no identifiable regional variations. Acoustic contrast   opacification revealed no evidence ofthrombus. - Aortic valve: Right coronary cusp mobility was mildly restricted. - Mitral valve: Calcified annulus. - Left atrium: The atrium was severely dilated. - Right ventricle: The cavity size was mildly dilated. Systolic   function was mildly to moderately reduced. - Right atrium: The atrium was severely dilated. - Pericardium, extracardiac: A trivial pericardial effusion was   identified.     Echo 08/24/16 Study Conclusions  - Left ventricle: The cavity size was moderately dilated. There was   moderate concentric hypertrophy. Systolic function was severely   reduced. The estimated ejection fraction was 15%. There is   akinesis of the apicalinferoseptal  myocardium. There is severe   hypokinesis of the entirelateral myocardium. There is akinesis of   the entireinferolateral and inferior myocardium. There was a   reduced contribution of atrial contraction to ventricular   filling, due to increased ventricular diastolic pressure or   atrial contractile dysfunction. Doppler parameters are consistent   with a reversible restrictive pattern, indicative of decreased   left ventricular diastolic compliance and/or increased left   atrial pressure (grade 3 diastolic dysfunction). Doppler   parameters are consistent with high ventricular filling pressure. - Aortic valve: Trileaflet; moderately thickened, mildly calcified   leaflets. There was trivial regurgitation. - Mitral valve: Calcified annulus. There was mild to moderate   regurgitation. - Left atrium: The atrium was mildly dilated. - Tricuspid valve: There was moderate regurgitation. - Pulmonary arteries: PA peak pressure: 38 mm Hg (S). - Recommendations: Recommend limited study with defninty contrast   to completely rule out apical mural thrombus.  Impressions:  - COmpared to study of 2013, the LV function has significantly   declined with new wall motion abnormalities. The right   ventricular systolic pressure was increased consistent with mild   pulmonary hypertension.   RHC/LHC (09/01/16) Coronary Findings   Dominance: Right  Left Main  50% ostial stenosis.  Left Anterior Descending  Totally occluded proximal LAD after D1. Medium-sized D1 patent with 40-50% stenosis proximally. LIMA-distal LAD patent. Proximal to the LIMA touchdown there are serial 80% stenoses in the mid LAD.  Left Circumflex  LCx occluded proximally. OMs occluded proximally. Sequential SVG-OM1 and OM2 patent with good flow in target vessels.  Right Coronary Artery  RCA occluded at the ostium.  SVG-PDA patent, backfills only to distal RCA.  Right Heart   Right Heart Pressures RHC Procedural  Findings: Hemodynamics (mmHg) RA mean 5 RV 40/8 PA 40/18 mean 27 PCWP mean 19 LV 88/24 AO 86/52  Oxygen saturations: PA 69% AO 98%  Cardiac Output (Fick) 6.43  Cardiac Index (Fick) 3.12  Cardiac Output (Thermo) 3.86 Cardiac Index (Thermo) 1.9      _____________   History of Present Illness     89M with CAD s/p CABG, hypertension, hyperlipidemia and chronic systolic and diastolic heart failure (LVEF 45-50%) came to ER with hypotension in the setting of over diuresis and taking lasix and torsemide at the same time.  He is also tachycardic with a lot of PACs and PVCs.  His rhythm is challenging to assess but does not appear to be atrial fibrillation.  We will give another bolus of saline, as his heart rate has decreased from the 140s on presentation to the 110s-120s with 1L.  He will need gentle hydration as he has struggled with volume overload as an outpatient.  No nodal agents for now as his tachycardia is compensatory in the setting of intravascular volume depletion.  Home BP meds and diuretics were held. Supplemented potassium given his frequent PVCs and PACs.  Pt admitted 08/23/16 after presenting.   Hospital Course     Consultants: HF [name]      By the 9th pt was not making urine and was hypotensive and felt to be in cardiogenic shock.  He was transferred to ICU.  Pt is full Code. IV milrinone was started.  Pt then needed norepinephrine for BP.  meds were adjusted with [name]  Got dose of Samscra UOP improved an dco-ox up to 68%, CVP to 8-9.   Also with transaminitis due to shock liver.  Acute renal failure on chronic as well. Initial HR wandering pacemaker, MAT.  Oral amiodarone was started for episode of a fib.    Echo 15% which had declined from 45-50% in 2013.  PA pk pressure 38 mmHg.    He slowly improved and in atrial fib with mild RVR on IV heparin and amiodarone changed to IV.  Limited echo with definity with no evidence of thrombus RV mildly dilated.  He  did develop hyponatremia and Tolvaptan was given and of prior to discharge.  He did have Rt and LHC see above.   Today he was seen by [name] and found stable for discharge.  Plans to go to SNF.     1. Cardiogenic shock/acute on chronic systolic CHF: Echo showed EF down to 15% from 45-50% on last echo in 2013.  Ischemic cardiomyopathy.  Has ICD.  Marked hypotension on 1/9, start on norepinephrine and was on milrinone. Co-ox improved with increased milrinone.  Norepinephrine stopped on 1/13. Milrinone stopped 1/16, co-ox 54% today. CVP 10.  RHC with good filling pressures and reasonable cardiac output.  - CVP now 10, increase torsemide back to 40 mg bid.   - Continue digoxin 0.125 mg daily. Level upper limit of acceptable.  Will need to follow closely (repeat tomorrow).    - Continue spironolactone 12.5 daily.  - Given age, unlikely to be candidate for advanced therapies.  -milrinone stopped 09/01/16  2. Hyponatremia: As low as 119.  Suspect hypervolemic hyponatremia in setting of CHF.  Now off tolvaptan.  - Continue fluid restriction, Na a little lower today. At D/C Na 128   3. AKI on CKD stage III: Continue to follow  closely.  Likely cardiorenal component. Creatinine has improved, probably near his baseline now.   4. CAD: s/p CABG.  EF has fallen considerably compared to last echo in 2013.  No recent chest pain.  Troponin mildly and stably elevated at admission, suspect demand ischemia with volume overload rather than ACS.  Coronary angiography done yesterday with 35 cc contrast => grafts remain patent, no targets for revascularization.  - Continue ASA and statin.   5. Atrial fibrillation: New.  Now in NSR on po amiodarone.  He is now on Eliquis. CHA2DS2VASc of 5.    6. Elevated LFTs: Marked rise in LFTs.  Abdominal US as above.  Suspect shock liver given cardiogenic shock with hypotension initially.   - LFTs trending down.  - Statin restarted.   7. ?LV thrombus: No thrombus on repeat  echo.   8. Thrombocytopenia: HIT negative.      9. Anemia: Hgb stable. Fe stores low.  Got feraheme 2 doses..    10. Deconditioning: Limited mobility even at baseline. SNF at d/c.  11. Diabetes, on lantus.  Held glucovance due to elevated Cr.  _____________  Discharge Vitals Blood pressure 96/77, pulse 77, temperature 98.4 F (36.9 C), temperature source Oral, resp. rate 18, height 5\' 10"  (1.778 m), weight 196 lb 3.4 oz (89 kg), SpO2 98 %.   CVP at discharge 10  Filed Weights   08/31/16 0312 09/01/16 0430 09/02/16 0400  Weight: 197 lb 5 oz (89.5 kg) 192 lb 14.4 oz (87.5 kg) 196 lb 3.4 oz (89 kg)    Labs & Radiologic Studies    CBC  Recent Labs  09/01/16 0413 09/02/16 0457  WBC 5.0 4.9  HGB 10.8* 10.2*  HCT 31.9* 30.4*  MCV 82.9 83.5  PLT 126* 124*   Basic Metabolic Panel  Recent Labs  09/01/16 0413 09/02/16 0457  NA 130* 128*  K 4.1 4.5  CL 91* 90*  CO2 28 28  GLUCOSE 179* 192*  BUN 24* 28*  CREATININE 1.71* 1.70*  CALCIUM 9.1 9.2   Liver Function Tests  Recent Labs  08/31/16 0245 09/01/16 0413  AST 59* 40  ALT 501* 341*  ALKPHOS 163* 155*  BILITOT 1.3* 1.2  PROT 6.5 6.7  ALBUMIN 3.7 3.5   No results for input(s): LIPASE, AMYLASE in the last 72 hours. Cardiac Enzymes Troponin 0.53; 0.70; 0.46   BNP 961  Mg+ 1.8  Total hgb 10.6 02 sat 53.5 Carboxyhemoglobin 1.7 Methemoglobin 1.2  D-Dimer No results for input(s): DDIMER in the last 72 hours. Hemoglobin A1C No results for input(s): HGBA1C in the last 72 hours. Fasting Lipid Panel No results for input(s): CHOL, HDL, LDLCALC, TRIG, CHOLHDL, LDLDIRECT in the last 72 hours. Thyroid Function Tests No results for input(s): TSH, T4TOTAL, T3FREE, THYROIDAB in the last 72 hours.  Invalid input(s): FREET3 _____________  Dg Chest Port 1 View  Result Date: 08/24/2016 CLINICAL DATA:  82 y/o  M; central line placement. EXAM: PORTABLE CHEST 1 VIEW COMPARISON:  08/23/2016 chest radiograph  FINDINGS: Stable cardiomegaly. Stable configuration of pacemaker. Left central venous catheter tip projects over the mid SVC. Clear lungs. No pneumothorax or pleural effusion. Post median sternotomy changes. IMPRESSION: No acute cardiopulmonary process. Stable cardiomegaly. Left central venous catheter tip projects over mid SVC. Electronically Signed   By: Mitzi Hansen M.D.   On: 08/24/2016 18:55   Dg Chest Portable 1 View  Result Date: 08/23/2016 CLINICAL DATA:  Hypotension.  Dizziness when standing. EXAM: PORTABLE CHEST 1 VIEW COMPARISON:  One-view chest x-ray 03/01/2010 FINDINGS: Cardiac enlargement is stable. Chronic interstitial coarsening is present without superimposed edema. AICD lead is stable. No focal airspace disease is present. Pacing wires are in place. Visualized soft tissues and bony thorax are unremarkable. IMPRESSION: 1. No acute cardiopulmonary disease. 2. Cardiomegaly without failure. Electronically Signed   By: Marin Roberts M.D.   On: 08/23/2016 15:49   Dg Abd Portable 1v  Result Date: 08/27/2016 CLINICAL DATA:  Nausea and vomiting EXAM: PORTABLE ABDOMEN - 1 VIEW COMPARISON:  CT abdomen and pelvis March 01, 2010 FINDINGS: Stomach appears moderately distended with air. There is no small or large bowel dilatation or air-fluid level. No free air evident. There is lumbar levoscoliosis with extensive arthropathy. There are multiple foci of vascular calcification in the pelvis. There are surgical clips in the right upper quadrant region. IMPRESSION: Stomach distended with air. No small or large bowel dilatation to suggest bowel obstruction. No free air. Advanced arthropathy and scoliosis in the lumbar region. Multifocal iliac and common femoral artery atherosclerosis. Electronically Signed   By: Bretta Bang III M.D.   On: 08/27/2016 09:12   US Abdomen Limited Ruq  Result Date: 08/25/2016 CLINICAL DATA:  Elevated liver function tests. EXAM: US ABDOMEN LIMITED - RIGHT  UPPER QUADRANT COMPARISON:  CT 03/01/2010. FINDINGS: Gallbladder: Cholecystectomy. Common bile duct: Diameter: 3.0 mm Liver: There is echogenic consistent with fatty infiltration and/or hepatocellular disease. No focal hepatic abnormality identified. Ascites and right pleural effusion noted. IMPRESSION: 1. Cholecystectomy.  No biliary distention. 2. Liver is echogenic consistent fatty infiltration and/or hepatocellular disease. No focal hepatic abnormality identified. 3. Ascites and right pleural effusion noted. Electronically Signed   By: Maisie Fus  Register   On: 08/25/2016 09:19   Disposition   Pt is being discharged home today in good condition.  Follow-up Plans & Appointments  Heart Healthy low salt diabetic diet  Weigh daily Call Heart failure clinicif weight climbs more than 3 pounds in a day or 5 pounds in a week. No salt to very little salt in your diet.  No more than 2000 mg in a day. Call if increased shortness of breath or increased swelling.  Needs BMP and CBC and Dig level 09/03/16.    Call [facility] at [phone] if any bleeding, swelling or drainage at cath site.  May shower, no tub baths for 48 hours for groin sticks. No lifting over 5 pounds for 3 days.       Contact information for follow-up providers    [facility] Follow up.   Specialty:  Home Health Services Why:  Home Health RN Contact information: [address] [place] [address] [phone]        Marca Ancona, MD Follow up.   Specialty:  Cardiology Why:  the office will call with date and time Contact information: [address] [place] [address] [phone]            Contact information for after-discharge care    Destination    [facility] Follow up.   Specialty:  Skilled Nursing Facility Contact information: [address] [place] [address] [phone]                    Discharge Medications   Current Discharge Medication List    START taking these medications   Details  amiodarone (PACERONE) 400 MG tablet Take 1 tablet (400 mg total) by mouth 2 (  two) times daily. Qty: 60 tablet, Refills: 6    apixaban (ELIQUIS) 5 MG TABS tablet Take 1 tablet (5 mg total) by mouth 2 (two) times daily. Qty: 60 tablet, Refills: 6    benzonatate (TESSALON) 100 MG capsule Take 1 capsule (100 mg total) by mouth 3 (three) times daily as needed for cough. Qty: 20 capsule, Refills: 0    bisacodyl (DULCOLAX) 5 MG EC tablet Take 1 tablet (5 mg total) by mouth daily as needed for moderate constipation. Qty: 30 tablet, Refills: 0    digoxin (LANOXIN) 0.125 MG tablet Take 1 tablet (0.125 mg total) by mouth daily. Qty: 30 tablet, Refills: 6    docusate sodium (COLACE) 100 MG capsule Take 1 capsule (100 mg total) by mouth daily. Qty: 10 capsule, Refills: 0    guaiFENesin-dextromethorphan (ROBITUSSIN DM) 100-10 MG/5ML syrup Take 5 mLs by mouth every 4 (four) hours as needed for cough. Qty: 118 mL, Refills: 0    hydrocortisone cream 1 % Apply topically as needed for itching. Qty: 30 g, Refills: 0    insulin aspart (NOVOLOG) 100 UNIT/ML injection Inject 0-15 Units into the skin 3 (three) times daily with meals. Qty: 10 mL, Refills: 11    LORazepam (ATIVAN) 0.5 MG tablet Take 1 tablet (0.5 mg total) by mouth every 6 (six) hours as needed for anxiety or sleep. Qty: 30 tablet, Refills: 0    menthol-cetylpyridinium (CEPACOL) 3 MG lozenge Take 1 lozenge (3 mg total) by mouth as needed for sore throat. Qty: 100 tablet, Refills: 12    polyethylene glycol (MIRALAX / GLYCOLAX) packet Take 17 g by mouth daily. Qty: 14 each, Refills: 0    potassium chloride SA (K-DUR,KLOR-CON) 20 MEQ tablet Take 2 tablets (40 mEq total) by mouth daily. Qty: 30 tablet, Refills: 6      CONTINUE these medications which have CHANGED   Details  torsemide (DEMADEX) 20 MG tablet Take 2 tablets  (40 mg total) by mouth 2 (two) times daily. Qty: 60 tablet, Refills: 6      CONTINUE these medications which have NOT CHANGED   Details  ACCU-CHEK AVIVA PLUS test strip CHECK BLOOD SUGAR TWICE A DAY Refills: 1    atorvastatin (LIPITOR) 10 MG tablet Take 10 mg by mouth daily.      esomeprazole (NEXIUM) 40 MG capsule Take 40 mg by mouth daily.      insulin glargine (LANTUS) 100 UNIT/ML injection Inject 10-40 Units into the skin at bedtime as needed. Sliding scale.  If cbg= 110: none, cbg=200 take 25-30 units, cbg over 250 take 40 units    spironolactone (ALDACTONE) 25 MG tablet TAKE ONE-HALF (1/2) TABLET DAILY Qty: 45 tablet, Refills: 10      STOP taking these medications     aspirin EC 81 MG tablet      carvedilol (COREG) 3.125 MG tablet      furosemide (LASIX) 20 MG tablet      glyBURIDE-metformin (GLUCOVANCE) 5-500 MG per tablet      lisinopril (PRINIVIL,ZESTRIL) 2.5 MG tablet             Outstanding Labs/Studies   NEEDS BMP, DIG LEVEL AND CBC 09/03/16  Send to [name] with heart failure.   Duration of Discharge Encounter   Greater than 30 minutes including physician time.  Signed, Nada Boozer NP 09/02/2016, 4:55 PM

## 2016-09-02 NOTE — Progress Notes (Signed)
Patient ID: [name], male   DOB: 1935/04/29, 82 y.o.   MRN: [id number]   SUBJECTIVE: Patient admitted with acute on chronic systolic CHF.  Developed cardiogenic shock on 1/9 with significant hypotension requiring initiation of norepinephrine.  Poor UOP 1/10 am with rising creatinine and low co-ox.  Milrinone increased to 0.25 and continued on low dose norepinephrine.  Improved co-ox with very good UOP with med adjustment.  Tolvaptan begun for hyponatremia, now off.   CVP 10 this am, only got torsemide once yesterday.  Co-ox 54%.  No dyspnea or chest pain, very eager to leave hospital.   Very limited mobility at baseline ( uses a scooter at home.)   Platelets rising now.  HIT negative.    He is remains in NSR today on amiodarone po.   LFTs elevated but trending down. ABD [place] with echogenic liver consistent with fatty liver disease versus hepatocellular disease.   Echo: 08/24/2016 EF 15%, moderate LVH, mild-moderate MR, cannot rule out thrombus.  ECHO: 08/26/2016: EF 10-15% No evidence of thrombus RV mildly dilated.    RHC/LHC (09/01/16) Coronary Findings   Dominance: Right  Left Main  50% ostial stenosis.  Left Anterior Descending  Totally occluded proximal LAD after D1. Medium-sized D1 patent with 40-50% stenosis proximally. LIMA-distal LAD patent. Proximal to the LIMA touchdown there are serial 80% stenoses in the mid LAD.  Left Circumflex  LCx occluded proximally. OMs occluded proximally. Sequential SVG-OM1 and OM2 patent with good flow in target vessels.  Right Coronary Artery  RCA occluded at the ostium. SVG-PDA patent, backfills only to distal RCA.  Right Heart   Right Heart Pressures RHC Procedural Findings: Hemodynamics (mmHg) RA mean 5 RV 40/8 PA 40/18 mean 27 PCWP mean 19 LV 88/24 AO 86/52  Oxygen saturations: PA 69% AO 98%  Cardiac Output (Fick) 6.43  Cardiac Index (Fick) 3.12  Cardiac Output (Thermo) 3.86 Cardiac Index (Thermo) 1.9      Scheduled  Meds: . amiodarone  400 mg Oral BID  . apixaban  5 mg Oral BID  . atorvastatin  10 mg Oral q1800  . digoxin  0.125 mg Oral Daily  . docusate sodium  100 mg Oral Daily  . ferumoxytol  510 mg Intravenous Weekly  . insulin aspart  0-15 Units Subcutaneous TID WC  . pantoprazole  80 mg Oral Q1200  . polyethylene glycol  17 g Oral Daily  . potassium chloride  40 mEq Oral Daily  . sodium chloride flush  10-40 mL Intracatheter Q12H  . sodium chloride flush  3 mL Intravenous Q12H  . spironolactone  12.5 mg Oral Daily  . torsemide  40 mg Oral BID   Continuous Infusions:  PRN Meds:.sodium chloride, sodium chloride, acetaminophen, benzonatate, bisacodyl, guaiFENesin-dextromethorphan, hydrocortisone cream, LORazepam, menthol-cetylpyridinium, ondansetron (ZOFRAN) IV, promethazine, sodium chloride flush, sodium chloride flush    Vitals:   09/02/16 0300 09/02/16 0400 09/02/16 0500 09/02/16 0600  BP: (!) 86/60 (!) 100/58 101/73 117/72  Pulse: 76 77 82 78  Resp: 17 (!) 9 (!) 21 16  Temp:  98.4 F (36.9 C)    TempSrc:      SpO2: 98% 96% 90% 94%  Weight:  196 lb 3.4 oz (89 kg)    Height:        Intake/Output Summary (Last 24 hours) at 09/02/16 0739 Last data filed at 09/02/16 0600  Gross per 24 hour  Intake          1616.26 ml  Output  2631 ml  Net         -1014.74 ml    LABS: Basic Metabolic Panel:  Recent Labs  65/03/54 0413 09/02/16 0457  NA 130* 128*  K 4.1 4.5  CL 91* 90*  CO2 28 28  GLUCOSE 179* 192*  BUN 24* 28*  CREATININE 1.71* 1.70*  CALCIUM 9.1 9.2   Liver Function Tests:  Recent Labs  08/31/16 0245 09/01/16 0413  AST 59* 40  ALT 501* 341*  ALKPHOS 163* 155*  BILITOT 1.3* 1.2  PROT 6.5 6.7  ALBUMIN 3.7 3.5   No results for input(s): LIPASE, AMYLASE in the last 72 hours. CBC:  Recent Labs  09/01/16 0413 09/02/16 0457  WBC 5.0 4.9  HGB 10.8* 10.2*  HCT 31.9* 30.4*  MCV 82.9 83.5  PLT 126* 124*   Cardiac Enzymes: No results for  input(s): CKTOTAL, CKMB, CKMBINDEX, TROPONINI in the last 72 hours. BNP: Invalid input(s): POCBNP D-Dimer: No results for input(s): DDIMER in the last 72 hours. Hemoglobin A1C: No results for input(s): HGBA1C in the last 72 hours. Fasting Lipid Panel: No results for input(s): CHOL, HDL, LDLCALC, TRIG, CHOLHDL, LDLDIRECT in the last 72 hours. Thyroid Function Tests: No results for input(s): TSH, T4TOTAL, T3FREE, THYROIDAB in the last 72 hours.  Invalid input(s): FREET3 Anemia Panel: No results for input(s): VITAMINB12, FOLATE, FERRITIN, TIBC, IRON, RETICCTPCT in the last 72 hours.  RADIOLOGY: Dg Chest Port 1 View  Result Date: 08/24/2016 CLINICAL DATA:  82 y/o  M; central line placement. EXAM: PORTABLE CHEST 1 VIEW COMPARISON:  08/23/2016 chest radiograph FINDINGS: Stable cardiomegaly. Stable configuration of pacemaker. Left central venous catheter tip projects over the mid SVC. Clear lungs. No pneumothorax or pleural effusion. Post median sternotomy changes. IMPRESSION: No acute cardiopulmonary process. Stable cardiomegaly. Left central venous catheter tip projects over mid SVC. Electronically Signed   By: Mitzi Hansen M.D.   On: 08/24/2016 18:55   Dg Chest Portable 1 View  Result Date: 08/23/2016 CLINICAL DATA:  Hypotension.  Dizziness when standing. EXAM: PORTABLE CHEST 1 VIEW COMPARISON:  One-view chest x-ray 03/01/2010 FINDINGS: Cardiac enlargement is stable. Chronic interstitial coarsening is present without superimposed edema. AICD lead is stable. No focal airspace disease is present. Pacing wires are in place. Visualized soft tissues and bony thorax are unremarkable. IMPRESSION: 1. No acute cardiopulmonary disease. 2. Cardiomegaly without failure. Electronically Signed   By: Marin Roberts M.D.   On: 08/23/2016 15:49   Dg Abd Portable 1v  Result Date: 08/27/2016 CLINICAL DATA:  Nausea and vomiting EXAM: PORTABLE ABDOMEN - 1 VIEW COMPARISON:  CT abdomen and pelvis  March 01, 2010 FINDINGS: Stomach appears moderately distended with air. There is no small or large bowel dilatation or air-fluid level. No free air evident. There is lumbar levoscoliosis with extensive arthropathy. There are multiple foci of vascular calcification in the pelvis. There are surgical clips in the right upper quadrant region. IMPRESSION: Stomach distended with air. No small or large bowel dilatation to suggest bowel obstruction. No free air. Advanced arthropathy and scoliosis in the lumbar region. Multifocal iliac and common femoral artery atherosclerosis. Electronically Signed   By: Bretta Bang III M.D.   On: 08/27/2016 09:12   US Abdomen Limited Ruq  Result Date: 08/25/2016 CLINICAL DATA:  Elevated liver function tests. EXAM: US ABDOMEN LIMITED - RIGHT UPPER QUADRANT COMPARISON:  CT 03/01/2010. FINDINGS: Gallbladder: Cholecystectomy. Common bile duct: Diameter: 3.0 mm Liver: There is echogenic consistent with fatty infiltration and/or hepatocellular disease. No focal hepatic  abnormality identified. Ascites and right pleural effusion noted. IMPRESSION: 1. Cholecystectomy.  No biliary distention. 2. Liver is echogenic consistent fatty infiltration and/or hepatocellular disease. No focal hepatic abnormality identified. 3. Ascites and right pleural effusion noted. Electronically Signed   By: Maisie Fus  Register   On: 08/25/2016 09:19    PHYSICAL EXAM CVP 10 General: Elderly appearing. NAD Neck: JVP 8-9 cm. No thyromegaly or thyroid nodule.  Lungs: Mildly diminished basilar sounds, otherwise stable.   CV: Lateral PMI.  Regular S1/S2, no S3/S4, no murmur. Trace ankle edema.     Abdomen: Soft, NT, ND, no HSM. No bruits or masses. +BS  Neurologic: Alert and oriented x 3.  Psych: Normal affect. Extremities: No clubbing or cyanosis. Warm   TELEMETRY: Reviewed personally, NSR  ASSESSMENT AND PLAN: 82 yo with history of CAD s/p CABG, ischemic cardiomyopathy, CKD stage III, HTN, DM was  admitted with acute on chronic systolic CHF with cardiogenic shock (hypotensive/tachycardic).  1. Cardiogenic shock/acute on chronic systolic CHF: Echo showed EF down to 15% from 45-50% on last echo in 2013.  Ischemic cardiomyopathy.  Has ICD.  Marked hypotension on 1/9, start on norepinephrine and now on milrinone. Co-ox improved with increased milrinone.  Norepinephrine stopped on 1/13. Milrinone stopped 1/16, co-ox 54% today. CVP 10.  RHC yesterday with good filling pressures and reasonable cardiac output.  - CVP now 10, increase torsemide back to 40 mg bid.   - Continue digoxin 0.125 mg daily. Level upper limit of acceptable.  Will need to follow closely (repeat tomorrow).    - Continue spironolactone 12.5 daily.  - Given age, unlikely to be candidate for advanced therapies.  2. Hyponatremia: As low as 119.  Suspect hypervolemic hyponatremia in setting of CHF.  Now off tolvaptan.  - Continue fluid restriction, Na a little lower today.  3. AKI on CKD stage III: Continue to follow closely.  Likely cardiorenal component. Creatinine has improved, probably near his baseline now.  4. CAD: s/p CABG.  EF has fallen considerably compared to last echo in 2013.  No recent chest pain.  Troponin mildly and stably elevated at admission, suspect demand ischemia with volume overload rather than ACS.  Coronary angiography done yesterday with 35 cc contrast => grafts remain patent, no targets for revascularization.  - Continue ASA and statin.  5. Atrial fibrillation: New.  Now in NSR on po amiodarone.  He is now on Eliquis.  6. Elevated LFTs: Marked rise in LFTs.  Abdominal US as above.  Suspect shock liver given cardiogenic shock with hypotension initially.   - LFTs trending down.  - Statin restarted.  7. ?LV thrombus: No thrombus on repeat echo.  8. Thrombocytopenia: HIT negative.     9. Anemia: Hgb stable. Fe stores low.  Got feraheme.   10. Deconditioning: Limited mobility even at baseline. SNF at d/c.  I  think that he is ready for SNF, just waiting for placement at this point.  Will transfer to stepdown.    Marca Ancona, MD  09/02/2016 7:39 AM   Advanced Heart Failure Team Pager [phone] (M-F; 7a - 4p)  Please contact [facility] for night-coverage after hours (4p -7a ) and weekends on [email or web address]

## 2016-09-02 NOTE — [place] FL2 (Signed)
[place] MEDICAID FL2 LEVEL OF CARE SCREENING TOOL     IDENTIFICATION  Patient Name: [name] Birthdate: 11-Nov-1934 Sex: male Admission Date (Current Location): 08/23/2016  [facility] and [place] Number:  Producer, television/film/video and Address:  [facility] [place], [address] [place], [place] [address]      Provider Number: [id number]  Attending Physician Name and Address:  Chilton Si, MD  Relative Name and Phone Number:       Current Level of Care: Hospital Recommended Level of Care: Skilled Nursing Facility Prior Approval Number:    Date Approved/Denied:   PASRR Number:   [id number]   Discharge Plan: SNF    Current Diagnoses: Patient Active Problem List   Diagnosis Date Noted  . Atrial fibrillation with rapid ventricular response (HCC)   . Cardiogenic shock (HCC)   . Acute renal failure (HCC)   . Hypotension 08/23/2016  . Acute on chronic combined systolic and diastolic congestive heart failure (HCC) 08/05/2016  . Chronic renal failure in pediatric patient, stage 3 (moderate) 08/05/2016  . Single implantable cardioverter-defibrillator BSX    . Ventricular tachycardia-treated the ATP 01/29/2011  . Hx of CABG 04/17/2009  . Cardiomyopathy, ischemic 04/17/2009  . Chronic systolic heart failure (HCC) 04/17/2009  . Insulin dependent diabetes mellitus (HCC) 04/12/2009  . HYPERLIPIDEMIA 04/12/2009  . HYPERTENSION 04/12/2009  . PUD 04/12/2009  . Rheumatoid arthritis (HCC) 04/12/2009    Orientation RESPIRATION BLADDER Height & Weight     Self, Time, Situation, Place  Normal Continent Weight: 196 lb 3.4 oz (89 kg) Height:  5\' 10"  (177.8 cm)  BEHAVIORAL SYMPTOMS/MOOD NEUROLOGICAL BOWEL NUTRITION STATUS      Continent Diet (See DC summary)  AMBULATORY STATUS COMMUNICATION OF NEEDS Skin   Extensive Assist Verbally Normal                       Personal Care Assistance Level of Assistance  Bathing, Feeding, Dressing Bathing Assistance:  Limited assistance Feeding assistance: Limited assistance Dressing Assistance: Limited assistance     Functional Limitations Info  Sight, Hearing, Speech Sight Info: Adequate Hearing Info: Adequate Speech Info: Adequate    SPECIAL CARE FACTORS FREQUENCY  PT (By licensed PT), OT (By licensed OT)     PT Frequency: 5x OT Frequency: 5x            Contractures Contractures Info: Not present    Additional Factors Info  Code Status, Allergies Code Status Info: Full Code Allergies Info: NKA           Current Medications (09/02/2016):  This is the current hospital active medication list Current Facility-Administered Medications  Medication Dose Route Frequency Provider Last Rate Last Dose  . 0.9 %  sodium chloride infusion   Intravenous PRN 09/04/2016, MD   Stopped at 08/28/16 [phone]  . 0.9 %  sodium chloride infusion  250 mL Intravenous PRN 1324, MD      . acetaminophen (TYLENOL) tablet 650 mg  650 mg Oral Q4H PRN Laurey Morale, MD      . amiodarone (PACERONE) tablet 400 mg  400 mg Oral BID Laurey Morale, MD   400 mg at 09/02/16 1001  . apixaban (ELIQUIS) tablet 5 mg  5 mg Oral BID 09/04/16, MD   5 mg at 09/02/16 1001  . atorvastatin (LIPITOR) tablet 10 mg  10 mg Oral q1800 09/04/16, MD   10 mg at 09/01/16 1713  .  benzonatate (TESSALON) capsule 100 mg  100 mg Oral TID PRN Graciella Freer, PA-C   100 mg at 09/01/16 1347  . bisacodyl (DULCOLAX) EC tablet 5 mg  5 mg Oral Daily PRN Sherald Hess, NP   5 mg at 08/26/16 1735  . digoxin (LANOXIN) tablet 0.125 mg  0.125 mg Oral Daily Laurey Morale, MD   0.125 mg at 09/02/16 1002  . docusate sodium (COLACE) capsule 100 mg  100 mg Oral Daily Bhavinkumar Bhagat, PA   100 mg at 09/02/16 1001  . ferumoxytol (FERAHEME) 510 mg in sodium chloride 0.9 % 100 mL IVPB  510 mg Intravenous Weekly Graciella Freer, PA-C   510 mg at 08/30/16 7001  . guaiFENesin-dextromethorphan (ROBITUSSIN DM) 100-10 MG/5ML  syrup 5 mL  5 mL Oral Q4H PRN Chilton Si, MD   5 mL at 09/01/16 2222  . hydrocortisone cream 1 %   Topical PRN Laurey Morale, MD      . insulin aspart (novoLOG) injection 0-15 Units  0-15 Units Subcutaneous TID WC Brittainy Sherlynn Carbon, PA-C   5 Units at 09/01/16 1713  . LORazepam (ATIVAN) tablet 0.5 mg  0.5 mg Oral Q6H PRN Chilton Si, MD   0.5 mg at 09/01/16 2222  . menthol-cetylpyridinium (CEPACOL) lozenge 3 mg  1 lozenge Oral PRN Mariam Dollar Tillery, PA-C      . ondansetron [facility]) injection 4 mg  4 mg Intravenous Q6H PRN Laurey Morale, MD      . pantoprazole (PROTONIX) EC tablet 80 mg  80 mg Oral Q1200 Brittainy Sherlynn Carbon, PA-C   80 mg at 09/01/16 1204  . polyethylene glycol (MIRALAX / GLYCOLAX) packet 17 g  17 g Oral Daily Amy D Clegg, NP   17 g at 09/01/16 1000  . potassium chloride SA (K-DUR,KLOR-CON) CR tablet 40 mEq  40 mEq Oral Daily Laurey Morale, MD   40 mEq at 09/02/16 1001  . promethazine (PHENERGAN) injection 6.25 mg  6.25 mg Intravenous Q6H PRN Little Ishikawa, NP   6.25 mg at 08/27/16 0620  . sodium chloride flush (NS) 0.9 % injection 10-40 mL  10-40 mL Intracatheter Q12H Rahul P Desai, PA-C   30 mL at 09/02/16 1000  . sodium chloride flush (NS) 0.9 % injection 10-40 mL  10-40 mL Intracatheter PRN Rahul P Desai, PA-C      . sodium chloride flush (NS) 0.9 % injection 3 mL  3 mL Intravenous Q12H Laurey Morale, MD   3 mL at 09/02/16 1000  . sodium chloride flush (NS) 0.9 % injection 3 mL  3 mL Intravenous PRN Laurey Morale, MD      . spironolactone (ALDACTONE) tablet 12.5 mg  12.5 mg Oral Daily Laurey Morale, MD   12.5 mg at 09/02/16 1001  . torsemide (DEMADEX) tablet 40 mg  40 mg Oral BID Laurey Morale, MD   40 mg at 09/02/16 0900     Discharge Medications: Please see discharge summary for a list of discharge medications.  Relevant Imaging Results:  Relevant Lab Results:   Additional Information SSN:  [id number]  Raye Sorrow, [place]

## 2016-09-02 NOTE — Progress Notes (Addendum)
LCSW followed up with patient regarding bed offers.  Patient accepted bed to [facility] in [place]. Patient called his son to notify him of offer and plan.  Call placed to SNF in order to coordinate admission.  Admissions will call back and see if it is possible due to low staff at SNF and admissions working from home. Geraldine Contras will call LCSW back once spoken to nurse liasion.  (Call returned to LCSW regarding bed status and ability to move patient today.  Page sent to MD and awaiting call back if we will move today or in the morning, bed will be available in the AM. ) RN aware of potential DC or DC in AM.  Patient will be going to rehab hall 200 wing at SNF.   Report:  [phone]  Patient to be discharged today.  LCSW will send clinicals to facility via HUB. Patient will transport by EMS.  RN and patient aware of current barriers and once call back, will coordinate disposition.  Deretha Emory, MSW Clinical Social Work: Optician, dispensing Coverage for :  [phone]

## 2016-09-02 NOTE — Progress Notes (Signed)
Spoke w sw to alert them that pt will be ready for snf whenever bed available. Will cont to follow.

## 2016-09-02 NOTE — Clinical Social Work Note (Signed)
Clinical Social Work Assessment  Patient Details  Name: [name] MRN: [id number] Date of Birth: 01/18/1935  Date of referral:  09/02/16               Reason for consult:  Facility Placement                Permission sought to share information with:  Case Manager, Customer service manager, Family Supports Permission granted to share information::  Yes, Verbal Permission Granted  Name::        Agency::  SNF HUB  Relationship::  Family notified of plan, by patient  Contact Information:     Housing/Transportation Living arrangements for the past 2 months:  [place] of Information:  Patient, Medical Team, Case Manager Patient Interpreter Needed:  None Criminal Activity/Legal Involvement Pertinent to Current Situation/Hospitalization:  No - Comment as needed Significant Relationships:  Adult Children, Other Family Members Lives with:  Self Do you feel safe going back to the place where you live?  No Need for family participation in patient care:  No (Coment)  Care giving concerns:  Patient reports he does not feel safe to go home from hospital at this time and recommended SNF at DC.  Reports this will be his first admission to rehab and he would like to go close to home.  Prior to admission he was living in home, doing ADLs and needs independently.  Hoping to get back to his baseline after rehab. This will be for short term rehabilitation.    Social Worker assessment / plan:  LCSW met with patient at bedside. Explained role and reason for consult. Patient agreeable to placement post hospitalization. Hoping for [place]: [name].  Permission given to complete SNF work up.  Work up completed. MD please sign FL2. Call placed to SNF to view referral. Awaiting review and acceptance.  Will follow up with bed offers and assist with disposition.  Plan: SNF at DC.  Employment status:  Retired Nurse, adult PT Recommendations:  [place] / Referral to community resources:  [place]  Patient/Family's Response to care:  Agreeable to plan  Patient/Family's Understanding of and Emotional Response to Diagnosis, Current Treatment, and Prognosis:  Patient verbalizes understanding of recommendations and wishes for SNF.  Aware his current function level has regressed from admission.  Understanding of benefit for SNF.  Emotional Assessment Appearance:  Appears stated age Attitude/Demeanor/Rapport:    Affect (typically observed):  Accepting, Adaptable Orientation:  Oriented to Self, Oriented to Place, Oriented to  Time, Oriented to Situation Alcohol / Substance use:  Not Applicable Psych involvement (Current and /or in the community):  No (Comment)  Discharge Needs  Concerns to be addressed:  No discharge needs identified Readmission within the last 30 days:  No Current discharge risk:  None Barriers to Discharge:  No Barriers Identified, Continued Medical Work up   [place], LCSW 09/02/2016, 11:04 AM

## 2016-09-02 NOTE — Progress Notes (Signed)
  1649 - Report called to facility, [facility], report given to [facility] at [place].   1730 - Maureen Ralphs RN called back to state paperwork had not been received.  31 - Jody with SW made aware going to call facility.  1800 - Paperwork faxed per SW, Jody.   1810 - [place] called to verify paperwork had been received. Paperwork had not been received. Advised to fax papers to desk was given desk fax number as office is locked after hours where other fax machine is located. [phone]   1812- Augusto Gamble SW called made aware of paperwork not received by [facility]. Jody SW resending  paperwork to fax number given.  13 -  Verified with [facility] paperwork has been received.

## 2016-09-03 ENCOUNTER — Encounter: Payer: Self-pay | Admitting: Cardiology

## 2016-09-09 ENCOUNTER — Telehealth: Payer: Self-pay | Admitting: Cardiology

## 2016-09-09 NOTE — Telephone Encounter (Signed)
Raynelle Fanning from the [facility] ph[facility] is calling to report a possible drug interaction (lanoxin with amiodarone.) Please call [phone] opt 2 twice. Thanks.

## 2016-09-09 NOTE — Telephone Encounter (Signed)
Meds were prescribed at his hospital discharge.   Any concern for adverse interaction beyond potentiative effects of digoxin on amiodarone?

## 2016-09-09 NOTE — Telephone Encounter (Signed)
Called & advised local pharmacy [place] to fill. Called patient who voiced understanding but states he is still admitted at [facility] in [place]. Notes also he has appt at HF clinic in AM.  I called there w patient's permission to make sure labwork instructions had been received from hospital. They hadn't, but I spoke w Aisha LPN who provided fax number so that I could send patient's hospital discharge. Voiced that she could get labs drawn today to send w patient for his HF clinic visit tomorrow. Confirmed appt info for her benefit so that she can notify facility transportation. Aware results of labwork including dig, BMET, CBC to go to [name]. Aware to call back if I can help with anything further.

## 2016-09-09 NOTE — Telephone Encounter (Signed)
F/u Message  Raynelle Fanning returning RN call to discuss dosage on pt amiodarone. Please call back to discuss

## 2016-09-09 NOTE — Telephone Encounter (Signed)
Ok to fill both.  Please call patient to see if he got digoxin level drawn.  Should have been done day or two after hospital d/c

## 2016-09-10 ENCOUNTER — Encounter (HOSPITAL_COMMUNITY): Payer: Self-pay

## 2016-09-10 ENCOUNTER — Ambulatory Visit (HOSPITAL_COMMUNITY)
Admission: RE | Admit: 2016-09-10 | Discharge: 2016-09-10 | Disposition: A | Discharge status: Home / Self Care | Payer: No Typology Code available for payment source | Source: Ambulatory Visit | Attending: Cardiology | Admitting: Cardiology

## 2016-09-10 ENCOUNTER — Ambulatory Visit: Payer: Medicare Other | Admitting: Cardiology

## 2016-09-10 VITALS — BP 110/78 | HR 87 | Wt 192.2 lb

## 2016-09-10 DIAGNOSIS — Z8711 Personal history of peptic ulcer disease: Secondary | ICD-10-CM | POA: Diagnosis not present

## 2016-09-10 DIAGNOSIS — I2510 Atherosclerotic heart disease of native coronary artery without angina pectoris: Secondary | ICD-10-CM | POA: Diagnosis not present

## 2016-09-10 DIAGNOSIS — Z95810 Presence of automatic (implantable) cardiac defibrillator: Secondary | ICD-10-CM | POA: Diagnosis not present

## 2016-09-10 DIAGNOSIS — I4891 Unspecified atrial fibrillation: Secondary | ICD-10-CM | POA: Diagnosis not present

## 2016-09-10 DIAGNOSIS — D649 Anemia, unspecified: Secondary | ICD-10-CM | POA: Diagnosis not present

## 2016-09-10 DIAGNOSIS — E785 Hyperlipidemia, unspecified: Secondary | ICD-10-CM | POA: Insufficient documentation

## 2016-09-10 DIAGNOSIS — Z7901 Long term (current) use of anticoagulants: Secondary | ICD-10-CM | POA: Insufficient documentation

## 2016-09-10 DIAGNOSIS — Z794 Long term (current) use of insulin: Secondary | ICD-10-CM | POA: Diagnosis not present

## 2016-09-10 DIAGNOSIS — I5022 Chronic systolic (congestive) heart failure: Secondary | ICD-10-CM | POA: Diagnosis not present

## 2016-09-10 DIAGNOSIS — R5381 Other malaise: Secondary | ICD-10-CM

## 2016-09-10 DIAGNOSIS — I480 Paroxysmal atrial fibrillation: Secondary | ICD-10-CM

## 2016-09-10 DIAGNOSIS — M069 Rheumatoid arthritis, unspecified: Secondary | ICD-10-CM | POA: Diagnosis not present

## 2016-09-10 DIAGNOSIS — N183 Chronic kidney disease, stage 3 unspecified: Secondary | ICD-10-CM | POA: Insufficient documentation

## 2016-09-10 DIAGNOSIS — I5042 Chronic combined systolic (congestive) and diastolic (congestive) heart failure: Secondary | ICD-10-CM | POA: Insufficient documentation

## 2016-09-10 DIAGNOSIS — E1122 Type 2 diabetes mellitus with diabetic chronic kidney disease: Secondary | ICD-10-CM | POA: Diagnosis not present

## 2016-09-10 DIAGNOSIS — Z951 Presence of aortocoronary bypass graft: Secondary | ICD-10-CM | POA: Diagnosis not present

## 2016-09-10 DIAGNOSIS — I130 Hypertensive heart and chronic kidney disease with heart failure and stage 1 through stage 4 chronic kidney disease, or unspecified chronic kidney disease: Secondary | ICD-10-CM | POA: Diagnosis not present

## 2016-09-10 DIAGNOSIS — I255 Ischemic cardiomyopathy: Secondary | ICD-10-CM | POA: Insufficient documentation

## 2016-09-10 DIAGNOSIS — Z9889 Other specified postprocedural states: Secondary | ICD-10-CM | POA: Diagnosis not present

## 2016-09-10 DIAGNOSIS — E871 Hypo-osmolality and hyponatremia: Secondary | ICD-10-CM | POA: Diagnosis not present

## 2016-09-10 MED ORDER — LOSARTAN POTASSIUM 25 MG PO TABS: 12.5000 mg | ORAL_TABLET | Freq: Every day | ORAL | 3 refills | Status: DC

## 2016-09-10 MED ORDER — TORSEMIDE 20 MG PO TABS: 40.0000 mg | ORAL_TABLET | Freq: Two times a day (BID) | ORAL | Status: DC

## 2016-09-10 NOTE — Progress Notes (Signed)
Advanced Heart Failure Clinic Note   Primary Care: Lucila Maine MD Primary Cardiologist: Dr. [name] Primary HF: [name]   HPI:  [name] is a 82 y.o. male with CAD s/p CABG, hypertension, hyperlipidemia and chronic systolic and diastolic heart failure (LVEF 45-50%)  Admitted earlier this month with A/C systolic HF complicated by cardiogenic shock.  Required pressor support. Developed transaminitis 2/2 shock liver. Initial HR up with wandering pacemaker/MAT and then oral amiodarone started for episode of AFib.  Amio changed to IV with RVR. Tolvaptan given for hyponatremia.  L/RHC as below.   Limited Echo 08/26/16 LVEF 10-15%, Severe LAE, RV reduced, Severe RAE, Trivial pericardial perfusion.   He presents today for post hospital follow up. At [facility] in [place]. Progressing with PT, but still having trouble standing for long periods of time. Denies SOB changing clothes or bathing.  Sometimes gets winded with standing.  Denies orthopnea or PND. Deneis lightheadedness or dizziness. Denies peripheral edema. Only taking torsemide 20 mg daily by notes, despite having 40 mg BID ordered on discharge. Says he is still having excellent urine output.   Labs 09/07/16 Hgb 11.8, Digoxin 1.7 (dig decreased), Na 132, K 436, Creatinine 1.32  RHC/LHC (09/01/16) Coronary Findings  Dominance: Right Left Main: 50% ostial stenosis LAD: Totally occluded proximal LAD after D1. Medium-sized D1 patent with 40-50% stenosis proximally. LIMA-distal LAD patent. Proximal to the LIMA touchdown there are serial 80% stenoses in the mid LAD. Left Cx: LCx occluded proximally. OMs occluded proximally. Sequential SVG-OM1 and OM2 patent with good flow in target vessels. RCA: RCA occluded at the ostium. SVG-PDA patent, backfills only to distal RCA.  Right Heart  RHC Procedural Findings: Hemodynamics (mmHg) RA mean 5 RV 40/8 PA 40/18 mean 27 PCWP mean 19 LV 88/24 AO 86/52 Oxygen saturations: PA  69% AO 98% Cardiac Output (Fick) 6.43  Cardiac Index (Fick) 3.12 Cardiac Output (Thermo) 3.86 Cardiac Index (Thermo) 1.9    Past Medical History:  Diagnosis Date  . Congestive heart failure, unspecified   . Coronary atherosclerosis of unspecified type of vessel, native or graft   . ICD (implantable cardiac defibrillator), single BSX    DOI 2008  . Ischemic cardiomyopathy   . Other and unspecified hyperlipidemia   . Peptic ulcer, unspecified site, unspecified as acute or chronic, without mention of hemorrhage, perforation, or obstruction   . Rheumatoid arthritis(714.0)   . Type II or unspecified type diabetes mellitus without mention of complication, not stated as uncontrolled   . Unspecified essential hypertension   . Ventricular tachycardia (HCC)    Rx via ICD 5 /2012    Current Outpatient Prescriptions  Medication Sig Dispense Refill  . ACCU-CHEK AVIVA PLUS test strip CHECK BLOOD SUGAR TWICE A DAY  1  . amiodarone (PACERONE) 400 MG tablet Take 1 tablet (400 mg total) by mouth 2 (two) times daily. 60 tablet 6  . apixaban (ELIQUIS) 5 MG TABS tablet Take 1 tablet (5 mg total) by mouth 2 (two) times daily. 60 tablet 6  . atorvastatin (LIPITOR) 10 MG tablet Take 10 mg by mouth daily.      . benzonatate (TESSALON) 100 MG capsule Take 1 capsule (100 mg total) by mouth 3 (three) times daily as needed for cough. 20 capsule 0  . bisacodyl (DULCOLAX) 5 MG EC tablet Take 1 tablet (5 mg total) by mouth daily as needed for moderate constipation. 30 tablet 0  . digoxin (LANOXIN) 0.125 MG tablet Take 1 tablet (0.125 mg total)  by mouth daily. 30 tablet 6  . docusate sodium (COLACE) 100 MG capsule Take 1 capsule (100 mg total) by mouth daily. 10 capsule 0  . esomeprazole (NEXIUM) 40 MG capsule Take 40 mg by mouth daily.      Marland Kitchen guaiFENesin-dextromethorphan (ROBITUSSIN DM) 100-10 MG/5ML syrup Take 5 mLs by mouth every 4 (four) hours as needed for cough. 118 mL 0  . hydrocortisone cream 1 % Apply  topically as needed for itching. 30 g 0  . insulin aspart (NOVOLOG) 100 UNIT/ML injection Inject 0-15 Units into the skin 3 (three) times daily with meals. 10 mL 11  . insulin glargine (LANTUS) 100 UNIT/ML injection Inject 10-40 Units into the skin at bedtime as needed. Sliding scale.  If cbg= 110: none, cbg=200 take 25-30 units, cbg over 250 take 40 units    . LORazepam (ATIVAN) 0.5 MG tablet Take 1 tablet (0.5 mg total) by mouth every 6 (six) hours as needed for anxiety or sleep. 30 tablet 0  . menthol-cetylpyridinium (CEPACOL) 3 MG lozenge Take 1 lozenge (3 mg total) by mouth as needed for sore throat. 100 tablet 12  . polyethylene glycol (MIRALAX / GLYCOLAX) packet Take 17 g by mouth daily. 14 each 0  . potassium chloride SA (K-DUR,KLOR-CON) 20 MEQ tablet Take 2 tablets (40 mEq total) by mouth daily. 30 tablet 6  . spironolactone (ALDACTONE) 25 MG tablet Take 25 mg by mouth daily.    Marland Kitchen torsemide (DEMADEX) 20 MG tablet Take 20 mg by mouth daily.     No current facility-administered medications for this encounter.     No Known Allergies    Social History   Social History  . Marital status: Married    Spouse name: N/A  . Number of children: N/A  . Years of education: N/A   Occupational History  . Not on file.   Social History Main Topics  . Smoking status: Never Smoker  . Smokeless tobacco: Never Used  . Alcohol use No  . Drug use: No  . Sexual activity: Not on file   Other Topics Concern  . Not on file   Social History Narrative   Married. Has 1 surviving child and 4 surviving grandchildren.       Family History  Problem Relation Age of Onset  . Coronary artery disease Sister   . Coronary artery disease Brother     Vitals:   09/10/16 1123  BP: 110/78  Pulse: 87  SpO2: 99%  Weight: 192 lb 3.2 oz (87.2 kg)   Wt Readings from Last 3 Encounters:  09/10/16 192 lb 3.2 oz (87.2 kg)  09/02/16 196 lb 3.4 oz (89 kg)  08/05/16 211 lb 12.8 oz (96.1 kg)    PHYSICAL  EXAM: General:  Well appearing. No respiratory difficulty HEENT: normal Neck: supple. no JVD. Carotids 2+ bilat; no bruits. No lymphadenopathy or thyromegaly appreciated. Cor: PMI nondisplaced. Regular rate & rhythm. No rubs, gallops or murmurs. Lungs: clear Abdomen: soft, nontender, nondistended. No hepatosplenomegaly. No bruits or masses. Good bowel sounds. Extremities: no cyanosis, clubbing, rash, edema Neuro: alert & oriented x 3, cranial nerves grossly intact. moves all 4 extremities w/o difficulty. Affect pleasant.  ECG: Sinus rhythm with PACs 82 bpm  ASSESSMENT & PLAN:  1. Chronic systolic CHF: Limited Echo 08/26/16 LVEF 10-15%, Severe LAE, RV reduced, Severe RAE, Trivial pericardial perfusion.  - EF down from 45-50% in 2013.  2/2 ICM s/p ICD placement.  - Only on torsemide 20 mg daily, when discharged  was ordered 40 mg BID. Volume status looks OK, but suspect will re-accumulate. Will put back on previous chronic dose of 40 mg BID. Recheck BMET next week at facility.  - Continue digoxin 0.0625 mg daily.  Decreased 09/07/16 with elevated Dig level at 1.7. Have instructed facility to recheck level next week.  - Add Losartan 12.5 mg qhs. Follow BMET closely next week.  - Continue spironolactone 25 mg daily.  2. Hyponatremia:  - Improved on labs 09/07/16. Likely hypervolemic hyponatremia in setting of CHF.  - Continue fluid restriction.  3. CKD stage III:  - Stable on recent labs.  Check next week with addition of losartan and increased torsemide.  4. CAD: s/p CABG.  EF has fallen considerably compared to last echo in 2013.   - No recent chest pain.   - Coronary angiography this previous admission with patent graft and no targets for revascularization.   - Continue ASA and statin.  5. Atrial fibrillation:  - Remains in NSR by EKG - Needs to decrease amiodarone to 200 mg BID. (Have spoken with SNF RN directly to issue this order) - Continue Eliquis 5 mg BID.   6. ?LV thrombus: No  thrombus on repeat echo.  - He is on eliquis for Afib as above.  7. Anemia:  - Received IV iron during recent admission.  Further per PCP unless acute problems arise.  8. Deconditioning:  - Continue PT at SNF.   Meds as above. Labs next week. Follow up 3-4 weeks with MD.   Graciella Freer, PA-C 09/10/16   Total time spent > 25 minutes. Over half that spent discussing the above.

## 2016-09-10 NOTE — Patient Instructions (Addendum)
START Losartan 12.5 mg once daily.  INCREASE Torsemide to 40 mg twice daily.  Repeat BMET and Digoxin level in 1 week.  Follow up 3-4 weeks with [name].  Do the following things EVERYDAY: 1) Weigh yourself in the morning before breakfast. Write it down and keep it in a log. 2) Take your medicines as prescribed 3) Eat low salt foods-Limit salt (sodium) to 2000 mg per day.  4) Stay as active as you can everyday 5) Limit all fluids for the day to less than 2 liters

## 2016-09-17 NOTE — Progress Notes (Signed)
HPI: FU coronary disease, status post coronary artery bypassing graft, ischemic cardiomyopathy, hypertension, diabetes, and hyperlipidemia. He also has had a previous defibrillator placed. Patient recently admitted for heart failure/cardiogenic shock. He was noted to have MAT and atrial fibrillation and started on amiodarone. Echocardiogram showed ejection fraction 15%, grade 3 DD, mild to moderate MR, mild LAE; moderate TR. Cardiac catheterization showed a 50% left main, occluded LAD, occluded circumflex and occluded right coronary artery. LIMA to LAD patent. Sequential saphenous vein graft to the first and second marginal patent. Saphenous vein graft to the PDA patent. Pulmonary capillary wedge pressure 19. Treated medically including course of milrinone. Course complicated by transaminitis and renal insuff. Since last seen, his dyspnea is much improved. He denies orthopnea, PND, pedal edema, chest pain or syncope.  Current Outpatient Prescriptions  Medication Sig Dispense Refill  . ACCU-CHEK AVIVA PLUS test strip CHECK BLOOD SUGAR TWICE A DAY  1  . amiodarone (PACERONE) 400 MG tablet Take 1 tablet (400 mg total) by mouth 2 (two) times daily. 60 tablet 6  . apixaban (ELIQUIS) 5 MG TABS tablet Take 1 tablet (5 mg total) by mouth 2 (two) times daily. 60 tablet 6  . atorvastatin (LIPITOR) 10 MG tablet Take 10 mg by mouth daily.      . benzonatate (TESSALON) 100 MG capsule Take 1 capsule (100 mg total) by mouth 3 (three) times daily as needed for cough. 20 capsule 0  . bisacodyl (DULCOLAX) 5 MG EC tablet Take 1 tablet (5 mg total) by mouth daily as needed for moderate constipation. 30 tablet 0  . digoxin (LANOXIN) 0.125 MG tablet Take 1 tablet (0.125 mg total) by mouth daily. 30 tablet 6  . docusate sodium (COLACE) 100 MG capsule Take 1 capsule (100 mg total) by mouth daily. 10 capsule 0  . esomeprazole (NEXIUM) 40 MG capsule Take 40 mg by mouth daily.      Marland Kitchen guaiFENesin-dextromethorphan  (ROBITUSSIN DM) 100-10 MG/5ML syrup Take 5 mLs by mouth every 4 (four) hours as needed for cough. 118 mL 0  . hydrocortisone cream 1 % Apply topically as needed for itching. 30 g 0  . insulin aspart (NOVOLOG) 100 UNIT/ML injection Inject 0-15 Units into the skin 3 (three) times daily with meals. 10 mL 11  . insulin glargine (LANTUS) 100 UNIT/ML injection Inject 10-40 Units into the skin at bedtime as needed. Sliding scale.  If cbg= 110: none, cbg=200 take 25-30 units, cbg over 250 take 40 units    . LORazepam (ATIVAN) 0.5 MG tablet Take 1 tablet (0.5 mg total) by mouth every 6 (six) hours as needed for anxiety or sleep. 30 tablet 0  . losartan (COZAAR) 25 MG tablet Take 0.5 tablets (12.5 mg total) by mouth daily. 90 tablet 3  . menthol-cetylpyridinium (CEPACOL) 3 MG lozenge Take 1 lozenge (3 mg total) by mouth as needed for sore throat. 100 tablet 12  . polyethylene glycol (MIRALAX / GLYCOLAX) packet Take 17 g by mouth daily. 14 each 0  . potassium chloride SA (K-DUR,KLOR-CON) 20 MEQ tablet Take 2 tablets (40 mEq total) by mouth daily. 30 tablet 6  . spironolactone (ALDACTONE) 25 MG tablet Take 25 mg by mouth daily.    Marland Kitchen torsemide (DEMADEX) 20 MG tablet Take 2 tablets (40 mg total) by mouth 2 (two) times daily. 125 tablet    No current facility-administered medications for this visit.      Past Medical History:  Diagnosis Date  . Congestive heart  failure, unspecified   . Coronary atherosclerosis of unspecified type of vessel, native or graft   . ICD (implantable cardiac defibrillator), single BSX    DOI 2008  . Ischemic cardiomyopathy   . Other and unspecified hyperlipidemia   . Peptic ulcer, unspecified site, unspecified as acute or chronic, without mention of hemorrhage, perforation, or obstruction   . Rheumatoid arthritis(714.0)   . Type II or unspecified type diabetes mellitus without mention of complication, not stated as uncontrolled   . Unspecified essential hypertension   .  Ventricular tachycardia (HCC)    Rx via ICD 5 /2012    Past Surgical History:  Procedure Laterality Date  . CARDIAC CATHETERIZATION N/A 09/01/2016   Procedure: Right/Left Heart Cath and Coronary/Graft Angiography;  Surgeon: Laurey Morale, MD;  Location: [facility] INVASIVE CV LAB;  Service: Cardiovascular;  Laterality: N/A;  . CARDIAC DEFIBRILLATOR PLACEMENT     status post implantable  . coronary artey bypass graft     status post  . decomppression (other)     left median nerve  . IMPLANTABLE CARDIOVERTER DEFIBRILLATOR (ICD) GENERATOR CHANGE N/A 01/25/2014   Procedure: ICD GENERATOR CHANGE;  Surgeon: Duke Salvia, MD;  Location: [facility] CATH LAB;  Service: Cardiovascular;  Laterality: N/A;    Social History   Social History  . Marital status: Married    Spouse name: N/A  . Number of children: N/A  . Years of education: N/A   Occupational History  . Not on file.   Social History Main Topics  . Smoking status: Never Smoker  . Smokeless tobacco: Never Used  . Alcohol use No  . Drug use: No  . Sexual activity: Not on file   Other Topics Concern  . Not on file   Social History Narrative   Married. Has 1 surviving child and 4 surviving grandchildren.     Family History  Problem Relation Age of Onset  . Coronary artery disease Sister   . Coronary artery disease Brother     ROS: Leg weakness but no fevers or chills, productive cough, hemoptysis, dysphasia, odynophagia, melena, hematochezia, dysuria, hematuria, rash, seizure activity, orthopnea, PND, pedal edema, claudication. Remaining systems are negative.  Physical Exam: Well-developed well-nourished in no acute distress.  Skin is warm and dry. Ecchymosis noted. HEENT is normal.  Neck is supple.  Chest is clear to auscultation with normal expansion.  Cardiovascular exam is regular rate and rhythm.  Abdominal exam nontender or distended. No masses palpated. Extremities show no edema. neuro grossly intact   A/P  1  chronic systolic congestive heart failure-patient's volume status is much better. We will continue present dose of diuretics. He is following a low sodium diet and fluid restriction. Check potassium and renal function. He is unable to weigh daily as he cannot stand long enough.  2 ischemic cardiomyopathy-continue ARB. Blood pressure will not allow addition of beta blocker at this point. Continue digoxin.  3 coronary artery disease-continue statin. No aspirin given need for anticoagulation.  4 paroxysmal atrial fibrillation-decrease amiodarone to 200 mg daily. Continue apixaban.   5 chronic stage III kidney disease-check potassium and renal function.  6 prior ICD-monitored by electrophysiology.  Olga Millers, MD

## 2016-09-20 ENCOUNTER — Telehealth: Payer: Self-pay | Admitting: Cardiology

## 2016-09-20 NOTE — Telephone Encounter (Signed)
Spoke to patient, he is having occasional spots of blood when blowing nose. Gave advisement for saline nasal spray, keep mucous membranes moist, avoid blowing nose forcefully. Pt to follow guidance. Aware drier weather can make bleeds more possible. Aware to go to urgent care for unresolved epistaxis >30-40 mins.  Will follow up w Dr. [name] Friday. Aware to call if new concerns/questions in interim.

## 2016-09-20 NOTE — Telephone Encounter (Signed)
Mr. Kotowski is calling because his father is stating that when he blows his nose he gets a little bit of blood that comes out and when he coughs  A little blood comes out . He is on Eliquis . Please call

## 2016-09-20 NOTE — Telephone Encounter (Signed)
Mr.Caffee now sees Dr. [name]

## 2016-09-24 ENCOUNTER — Ambulatory Visit (INDEPENDENT_AMBULATORY_CARE_PROVIDER_SITE_OTHER): Payer: Medicare Other | Admitting: Cardiology

## 2016-09-24 ENCOUNTER — Encounter: Payer: Self-pay | Admitting: Cardiology

## 2016-09-24 VITALS — BP 89/50 | HR 88 | Ht 70.0 in

## 2016-09-24 DIAGNOSIS — I255 Ischemic cardiomyopathy: Secondary | ICD-10-CM

## 2016-09-24 DIAGNOSIS — I5022 Chronic systolic (congestive) heart failure: Secondary | ICD-10-CM

## 2016-09-24 MED ORDER — AMIODARONE HCL 200 MG PO TABS: 200.0000 mg | ORAL_TABLET | Freq: Every day | ORAL | Status: DC

## 2016-09-24 NOTE — Patient Instructions (Signed)
Medication Instructions:   DECREASE AMIODARONE TO 200 MG ONCE DAILY= 1/2 OF THE 400 MG TABLET ONCE DAILY  Labwork:  Your physician recommends that you HAVE LAB WORK TODAY  Follow-Up:  Your physician recommends that you schedule a follow-up appointment in: 3 MONTHS WITH DR [name]

## 2016-09-27 ENCOUNTER — Other Ambulatory Visit (HOSPITAL_COMMUNITY): Payer: Self-pay | Admitting: Cardiology

## 2016-09-27 DIAGNOSIS — I255 Ischemic cardiomyopathy: Secondary | ICD-10-CM

## 2016-09-27 MED ORDER — AMIODARONE HCL 200 MG PO TABS: 200.0000 mg | ORAL_TABLET | Freq: Every day | ORAL | 3 refills | Status: DC

## 2016-09-27 MED ORDER — LOSARTAN POTASSIUM 25 MG PO TABS: 12.5000 mg | ORAL_TABLET | Freq: Every day | ORAL | 3 refills | Status: DC

## 2016-09-27 MED ORDER — DIGOXIN 125 MCG PO TABS: 0.1250 mg | ORAL_TABLET | Freq: Every day | ORAL | 3 refills | Status: DC

## 2016-09-28 ENCOUNTER — Telehealth: Payer: Self-pay | Admitting: Cardiology

## 2016-09-28 ENCOUNTER — Other Ambulatory Visit (HOSPITAL_COMMUNITY): Payer: Self-pay | Admitting: *Deleted

## 2016-09-28 NOTE — Telephone Encounter (Signed)
Please call asap,she is at pt's home. She wants to know about pt getting lab work.

## 2016-09-28 NOTE — Telephone Encounter (Signed)
Nira Conn [facility] calling - aware pt needed lab orders. Gave verbal for BMET w result to Dr. [name], she voiced understanding and thanks. Aware to call if she needs further information such as fax #, etc.

## 2016-10-07 ENCOUNTER — Encounter: Payer: Self-pay | Admitting: Cardiology

## 2016-10-08 ENCOUNTER — Encounter (HOSPITAL_COMMUNITY): Payer: Self-pay

## 2016-10-08 ENCOUNTER — Ambulatory Visit (HOSPITAL_COMMUNITY)
Admission: RE | Admit: 2016-10-08 | Discharge: 2016-10-08 | Disposition: A | Discharge status: Home / Self Care | Payer: Medicare Other | Source: Ambulatory Visit | Attending: Cardiology | Admitting: Cardiology

## 2016-10-08 VITALS — BP 116/68 | HR 92 | Wt 195.0 lb

## 2016-10-08 DIAGNOSIS — Z8711 Personal history of peptic ulcer disease: Secondary | ICD-10-CM | POA: Diagnosis not present

## 2016-10-08 DIAGNOSIS — I5042 Chronic combined systolic (congestive) and diastolic (congestive) heart failure: Secondary | ICD-10-CM | POA: Insufficient documentation

## 2016-10-08 DIAGNOSIS — Z794 Long term (current) use of insulin: Secondary | ICD-10-CM | POA: Insufficient documentation

## 2016-10-08 DIAGNOSIS — I5022 Chronic systolic (congestive) heart failure: Secondary | ICD-10-CM

## 2016-10-08 DIAGNOSIS — E785 Hyperlipidemia, unspecified: Secondary | ICD-10-CM | POA: Insufficient documentation

## 2016-10-08 DIAGNOSIS — I255 Ischemic cardiomyopathy: Secondary | ICD-10-CM | POA: Diagnosis not present

## 2016-10-08 DIAGNOSIS — Z951 Presence of aortocoronary bypass graft: Secondary | ICD-10-CM

## 2016-10-08 DIAGNOSIS — E1122 Type 2 diabetes mellitus with diabetic chronic kidney disease: Secondary | ICD-10-CM | POA: Insufficient documentation

## 2016-10-08 DIAGNOSIS — N183 Chronic kidney disease, stage 3 unspecified: Secondary | ICD-10-CM

## 2016-10-08 DIAGNOSIS — Z7901 Long term (current) use of anticoagulants: Secondary | ICD-10-CM | POA: Diagnosis not present

## 2016-10-08 DIAGNOSIS — Z95810 Presence of automatic (implantable) cardiac defibrillator: Secondary | ICD-10-CM | POA: Insufficient documentation

## 2016-10-08 DIAGNOSIS — I130 Hypertensive heart and chronic kidney disease with heart failure and stage 1 through stage 4 chronic kidney disease, or unspecified chronic kidney disease: Secondary | ICD-10-CM | POA: Insufficient documentation

## 2016-10-08 DIAGNOSIS — I480 Paroxysmal atrial fibrillation: Secondary | ICD-10-CM

## 2016-10-08 DIAGNOSIS — M069 Rheumatoid arthritis, unspecified: Secondary | ICD-10-CM | POA: Insufficient documentation

## 2016-10-08 DIAGNOSIS — I4891 Unspecified atrial fibrillation: Secondary | ICD-10-CM | POA: Insufficient documentation

## 2016-10-08 DIAGNOSIS — I2510 Atherosclerotic heart disease of native coronary artery without angina pectoris: Secondary | ICD-10-CM | POA: Diagnosis present

## 2016-10-08 LAB — CBC
HEMATOCRIT: 33.5 % — AB (ref 39.0–52.0)
Hemoglobin: 11.4 g/dL — ABNORMAL LOW (ref 13.0–17.0)
MCH: 28 pg (ref 26.0–34.0)
MCHC: 34 g/dL (ref 30.0–36.0)
MCV: 82.3 fL (ref 78.0–100.0)
Platelets: 146 10*3/uL — ABNORMAL LOW (ref 150–400)
RBC: 4.07 MIL/uL — ABNORMAL LOW (ref 4.22–5.81)
RDW: 14 % (ref 11.5–15.5)
WBC: 6.9 10*3/uL (ref 4.0–10.5)

## 2016-10-08 LAB — COMPREHENSIVE METABOLIC PANEL
ALBUMIN: 4.1 g/dL (ref 3.5–5.0)
ALK PHOS: 78 U/L (ref 38–126)
ALT: 21 U/L (ref 17–63)
ANION GAP: 11 (ref 5–15)
AST: 25 U/L (ref 15–41)
BUN: 31 mg/dL — AB (ref 6–20)
CALCIUM: 9.4 mg/dL (ref 8.9–10.3)
CO2: 25 mmol/L (ref 22–32)
Chloride: 93 mmol/L — ABNORMAL LOW (ref 101–111)
Creatinine, Ser: 1.69 mg/dL — ABNORMAL HIGH (ref 0.61–1.24)
GFR calc Af Amer: 43 mL/min — ABNORMAL LOW (ref >60)
GFR calc non Af Amer: 37 mL/min — ABNORMAL LOW (ref >60)
GLUCOSE: 109 mg/dL — AB (ref 65–99)
Potassium: 4.4 mmol/L (ref 3.5–5.1)
SODIUM: 129 mmol/L — AB (ref 135–145)
Total Bilirubin: 0.7 mg/dL (ref 0.3–1.2)
Total Protein: 7.1 g/dL (ref 6.5–8.1)

## 2016-10-08 LAB — TSH: TSH: 11.55 u[IU]/mL — ABNORMAL HIGH (ref 0.350–4.500)

## 2016-10-08 LAB — DIGOXIN LEVEL: Digoxin Level: 1.2 ng/mL (ref 0.8–2.0)

## 2016-10-08 MED ORDER — CARVEDILOL 3.125 MG PO TABS: 3.1250 mg | ORAL_TABLET | Freq: Two times a day (BID) | ORAL | 3 refills | Status: DC

## 2016-10-08 NOTE — Patient Instructions (Signed)
Labs today (will call for abnormal results, otherwise no news is good news)  START taking Coreg 3.125 mg (1 Tablet) Two Times Daily  Follow up in 1 Month

## 2016-10-10 NOTE — Progress Notes (Signed)
Advanced Heart Failure Clinic Note   Primary Care: Lucila Maine MD Primary Cardiologist: Dr. [name] HF: [name]   HPI:  [name] is a 82 y.o. male with CAD s/p CABG, hypertension, hyperlipidemia and chronic systolic and diastolic heart failure.  Admitted in 1/18 with acute on chornic systolic HF complicated by cardiogenic shock.  Required support with both milrinone and norepinephrine. Developed transaminitis 2/2 shock liver.  He also developed atrial fibrillation with RVR requiring amiodarone to control.  Tolvaptan given for hyponatremia.  L/RHC as below.  Echo 08/26/16 LVEF 10-15%, Severe LAE, RV reduced, Severe RAE, Trivial pericardial perfusion. He was diuresed and titrated off milrinone/norepinephrine.    He is now home from SNF.  At baseline even prior to last hospitalization, he was limited to a motorized scooter due to severe knee arthritis.  He is working with PT.  Does very little walking.  No exertional dyspnea but minimally active.  No orthopnea/PND. No chest pain.  No tachypalpitations.  He is in NSR today.  No BRBPR/melena.   ECG: NSR, IVCD QRS 134 msec  Labs 1/18 Hgb 11.8 => 10.2, digoxin 1.7 (dig decreased), Na 132, K 4.5, Creatinine 1.32 => 1.7  RHC/LHC (09/01/16) Coronary Findings  Dominance: Right Left Main: 50% ostial stenosis LAD: Totally occluded proximal LAD after D1. Medium-sized D1 patent with 40-50% stenosis proximally. LIMA-distal LAD patent. Proximal to the LIMA touchdown there are serial 80% stenoses in the mid LAD. Left Cx: LCx occluded proximally. OMs occluded proximally. Sequential SVG-OM1 and OM2 patent with good flow in target vessels. RCA: RCA occluded at the ostium. SVG-PDA patent, backfills only to distal RCA.  Right Heart  RHC Procedural Findings: Hemodynamics (mmHg) RA mean 5 RV 40/8 PA 40/18 mean 27 PCWP mean 19 LV 88/24 AO 86/52 Oxygen saturations: PA 69% AO 98% Cardiac Output (Fick) 6.43  Cardiac Index (Fick) 3.12 Cardiac  Output (Thermo) 3.86 Cardiac Index (Thermo) 1.9    Past Medical History:  Diagnosis Date  . Congestive heart failure, unspecified   . Coronary atherosclerosis of unspecified type of vessel, native or graft   . ICD (implantable cardiac defibrillator), single BSX    DOI 2008  . Ischemic cardiomyopathy   . Other and unspecified hyperlipidemia   . Peptic ulcer, unspecified site, unspecified as acute or chronic, without mention of hemorrhage, perforation, or obstruction   . Rheumatoid arthritis(714.0)   . Type II or unspecified type diabetes mellitus without mention of complication, not stated as uncontrolled   . Unspecified essential hypertension   . Ventricular tachycardia (HCC)    Rx via ICD 5 /2012    Current Outpatient Prescriptions  Medication Sig Dispense Refill  . ACCU-CHEK AVIVA PLUS test strip CHECK BLOOD SUGAR TWICE A DAY  1  . amiodarone (PACERONE) 200 MG tablet Take 1 tablet (200 mg total) by mouth daily. 90 tablet 3  . apixaban (ELIQUIS) 5 MG TABS tablet Take 1 tablet (5 mg total) by mouth 2 (two) times daily. 60 tablet 6  . atorvastatin (LIPITOR) 10 MG tablet Take 10 mg by mouth daily.      . benzonatate (TESSALON) 100 MG capsule Take 1 capsule (100 mg total) by mouth 3 (three) times daily as needed for cough. 20 capsule 0  . bisacodyl (DULCOLAX) 5 MG EC tablet Take 1 tablet (5 mg total) by mouth daily as needed for moderate constipation. 30 tablet 0  . digoxin (LANOXIN) 0.125 MG tablet Take 1 tablet (0.125 mg total) by mouth daily. 90 tablet 3  .  docusate sodium (COLACE) 100 MG capsule Take 1 capsule (100 mg total) by mouth daily. 10 capsule 0  . esomeprazole (NEXIUM) 40 MG capsule Take 40 mg by mouth daily.      Marland Kitchen guaiFENesin-dextromethorphan (ROBITUSSIN DM) 100-10 MG/5ML syrup Take 5 mLs by mouth every 4 (four) hours as needed for cough. 118 mL 0  . hydrocortisone cream 1 % Apply topically as needed for itching. 30 g 0  . insulin aspart (NOVOLOG) 100 UNIT/ML injection  Inject 0-15 Units into the skin 3 (three) times daily with meals. 10 mL 11  . insulin glargine (LANTUS) 100 UNIT/ML injection Inject 10-40 Units into the skin at bedtime as needed. Sliding scale.  If cbg= 110: none, cbg=200 take 25-30 units, cbg over 250 take 40 units    . LORazepam (ATIVAN) 0.5 MG tablet Take 1 tablet (0.5 mg total) by mouth every 6 (six) hours as needed for anxiety or sleep. 30 tablet 0  . losartan (COZAAR) 25 MG tablet Take 0.5 tablets (12.5 mg total) by mouth daily. 45 tablet 3  . menthol-cetylpyridinium (CEPACOL) 3 MG lozenge Take 1 lozenge (3 mg total) by mouth as needed for sore throat. 100 tablet 12  . polyethylene glycol (MIRALAX / GLYCOLAX) packet Take 17 g by mouth daily. 14 each 0  . potassium chloride SA (K-DUR,KLOR-CON) 20 MEQ tablet Take 2 tablets (40 mEq total) by mouth daily. 30 tablet 6  . spironolactone (ALDACTONE) 25 MG tablet Take 25 mg by mouth daily.    Marland Kitchen torsemide (DEMADEX) 20 MG tablet Take 2 tablets (40 mg total) by mouth 2 (two) times daily. 125 tablet   . carvedilol (COREG) 3.125 MG tablet Take 1 tablet (3.125 mg total) by mouth 2 (two) times daily with a meal. 180 tablet 3   No current facility-administered medications for this encounter.     No Known Allergies    Social History   Social History  . Marital status: Married    Spouse name: N/A  . Number of children: N/A  . Years of education: N/A   Occupational History  . Not on file.   Social History Main Topics  . Smoking status: Never Smoker  . Smokeless tobacco: Never Used  . Alcohol use No  . Drug use: No  . Sexual activity: Not on file   Other Topics Concern  . Not on file   Social History Narrative   Married. Has 1 surviving child and 4 surviving grandchildren.       Family History  Problem Relation Age of Onset  . Coronary artery disease Sister   . Coronary artery disease Brother     Vitals:   10/08/16 1140  BP: 116/68  Pulse: 92  SpO2: 95%  Weight: 195 lb (88.5  kg)   Wt Readings from Last 3 Encounters:  10/08/16 195 lb (88.5 kg)  09/10/16 192 lb 3.2 oz (87.2 kg)  09/02/16 196 lb 3.4 oz (89 kg)    PHYSICAL EXAM: General:  Well appearing. No respiratory difficulty HEENT: normal Neck: supple. no JVD. Carotids 2+ bilat; no bruits. No lymphadenopathy or thyromegaly appreciated. Cor: PMI nondisplaced. Regular rate & rhythm. No rubs, gallops or murmurs. Lungs: clear Abdomen: soft, nontender, nondistended. No hepatosplenomegaly. No bruits or masses. Good bowel sounds. Extremities: no cyanosis, clubbing, rash.  Trace ankle edema.  Neuro: alert & oriented x 3, cranial nerves grossly intact. moves all 4 extremities w/o difficulty. Affect pleasant.  ASSESSMENT & PLAN:  1. Chronic systolic CHF:  Ischemic cardiomyopathy.  Echo 08/26/16 with LVEF 10-15%, severe biatrial enlargement, decreased RV systolic function.  EF down from 45-50% in 2013.  [employer] ICD.  Admitted in 1/18 with cardiogenic shock, recovered.  Doing ok but minimally active due to knee arthritis. He does not look volume overloaded.  - Continue torsemide 40 mg bid.  BMET today.  - Continue losartan 12.5 mg daily and spironolactone 25 mg daily.  - Continue digoxin 0.125 daily and check level.   - Start Coreg 3.125 mg bid.  2. CKD stage III: BMET today.  3. CAD: s/p CABG.  Coronary angiography 1/18 with patent graft and no targets for revascularization.  No recent chest pain.   - Continue ASA and statin.  4. Atrial fibrillation:  NSR today.  No tachypalpitations.  - Continue amiodarone 200 mg daily.  LFTs/TSH today.  He will need a regular lung exam.  - Continue Eliquis 5 mg BID.   5. Osteoarthritis: Knee OA significantly limits activity.   Followup in 1 month.   Marca Ancona, MD 10/10/16

## 2016-10-15 ENCOUNTER — Other Ambulatory Visit: Payer: Self-pay | Admitting: Cardiology

## 2016-10-16 LAB — T4, FREE: Free T4: 1.31 ng/dL (ref 0.82–1.77)

## 2016-10-16 LAB — T3, FREE: T3, Free: 2.1 pg/mL (ref 2.0–4.4)

## 2016-10-19 ENCOUNTER — Ambulatory Visit (INDEPENDENT_AMBULATORY_CARE_PROVIDER_SITE_OTHER): Payer: Medicare Other | Admitting: *Deleted

## 2016-10-19 DIAGNOSIS — I255 Ischemic cardiomyopathy: Secondary | ICD-10-CM | POA: Diagnosis not present

## 2016-10-19 NOTE — Progress Notes (Signed)
Remote ICD transmission.   

## 2016-10-20 ENCOUNTER — Encounter: Payer: Self-pay | Admitting: Cardiology

## 2016-10-20 LAB — CUP PACEART REMOTE DEVICE CHECK
Battery Remaining Longevity: 126 mo
Battery Remaining Percentage: 100 %
Date Time Interrogation Session: 20180306134800
HIGH POWER IMPEDANCE MEASURED VALUE: 45 Ohm
Implantable Lead Location: 753860
Implantable Lead Model: 158
Lead Channel Setting Pacing Amplitude: 2.4 V
Lead Channel Setting Pacing Pulse Width: 0.4 ms
Lead Channel Setting Sensing Sensitivity: 0.5 mV
MDC IDC LEAD IMPLANT DT: 20080415
MDC IDC LEAD SERIAL: 176041
MDC IDC MSMT LEADCHNL RV IMPEDANCE VALUE: 442 Ohm
MDC IDC MSMT LEADCHNL RV PACING THRESHOLD AMPLITUDE: 0.9 V
MDC IDC MSMT LEADCHNL RV PACING THRESHOLD PULSEWIDTH: 0.4 ms
MDC IDC PG IMPLANT DT: 20150612
MDC IDC PG SERIAL: 126688
MDC IDC STAT BRADY RV PERCENT PACED: 0 %

## 2016-10-25 ENCOUNTER — Other Ambulatory Visit (HOSPITAL_COMMUNITY): Payer: Self-pay | Admitting: Cardiology

## 2016-10-25 MED ORDER — APIXABAN 5 MG PO TABS: 5.0000 mg | ORAL_TABLET | Freq: Two times a day (BID) | ORAL | 3 refills | Status: DC

## 2016-11-12 ENCOUNTER — Telehealth (HOSPITAL_COMMUNITY): Payer: Self-pay | Admitting: *Deleted

## 2016-11-12 ENCOUNTER — Encounter (HOSPITAL_COMMUNITY): Payer: Self-pay

## 2016-11-12 ENCOUNTER — Ambulatory Visit (HOSPITAL_COMMUNITY)
Admission: RE | Admit: 2016-11-12 | Discharge: 2016-11-12 | Disposition: A | Discharge status: Home / Self Care | Payer: Medicare Other | Source: Ambulatory Visit | Attending: Cardiology | Admitting: Cardiology

## 2016-11-12 VITALS — BP 109/61 | HR 84 | Wt 211.2 lb

## 2016-11-12 DIAGNOSIS — I255 Ischemic cardiomyopathy: Secondary | ICD-10-CM | POA: Insufficient documentation

## 2016-11-12 DIAGNOSIS — I2510 Atherosclerotic heart disease of native coronary artery without angina pectoris: Secondary | ICD-10-CM | POA: Diagnosis not present

## 2016-11-12 DIAGNOSIS — N183 Chronic kidney disease, stage 3 unspecified: Secondary | ICD-10-CM

## 2016-11-12 DIAGNOSIS — Z951 Presence of aortocoronary bypass graft: Secondary | ICD-10-CM

## 2016-11-12 DIAGNOSIS — M069 Rheumatoid arthritis, unspecified: Secondary | ICD-10-CM | POA: Insufficient documentation

## 2016-11-12 DIAGNOSIS — I5022 Chronic systolic (congestive) heart failure: Secondary | ICD-10-CM | POA: Diagnosis not present

## 2016-11-12 DIAGNOSIS — Z794 Long term (current) use of insulin: Secondary | ICD-10-CM | POA: Insufficient documentation

## 2016-11-12 DIAGNOSIS — I130 Hypertensive heart and chronic kidney disease with heart failure and stage 1 through stage 4 chronic kidney disease, or unspecified chronic kidney disease: Secondary | ICD-10-CM | POA: Diagnosis not present

## 2016-11-12 DIAGNOSIS — I4891 Unspecified atrial fibrillation: Secondary | ICD-10-CM | POA: Diagnosis not present

## 2016-11-12 DIAGNOSIS — Z7901 Long term (current) use of anticoagulants: Secondary | ICD-10-CM | POA: Insufficient documentation

## 2016-11-12 DIAGNOSIS — R5381 Other malaise: Secondary | ICD-10-CM | POA: Diagnosis not present

## 2016-11-12 DIAGNOSIS — Z95810 Presence of automatic (implantable) cardiac defibrillator: Secondary | ICD-10-CM | POA: Insufficient documentation

## 2016-11-12 DIAGNOSIS — E785 Hyperlipidemia, unspecified: Secondary | ICD-10-CM | POA: Diagnosis not present

## 2016-11-12 DIAGNOSIS — I5042 Chronic combined systolic (congestive) and diastolic (congestive) heart failure: Secondary | ICD-10-CM | POA: Diagnosis not present

## 2016-11-12 DIAGNOSIS — I480 Paroxysmal atrial fibrillation: Secondary | ICD-10-CM | POA: Diagnosis not present

## 2016-11-12 DIAGNOSIS — Z8711 Personal history of peptic ulcer disease: Secondary | ICD-10-CM | POA: Insufficient documentation

## 2016-11-12 DIAGNOSIS — E1122 Type 2 diabetes mellitus with diabetic chronic kidney disease: Secondary | ICD-10-CM | POA: Insufficient documentation

## 2016-11-12 LAB — COMPREHENSIVE METABOLIC PANEL
ALBUMIN: 3.6 g/dL (ref 3.5–5.0)
ALT: 26 U/L (ref 17–63)
ANION GAP: 10 (ref 5–15)
AST: 22 U/L (ref 15–41)
Alkaline Phosphatase: 69 U/L (ref 38–126)
BUN: 34 mg/dL — AB (ref 6–20)
CHLORIDE: 99 mmol/L — AB (ref 101–111)
CO2: 25 mmol/L (ref 22–32)
Calcium: 9 mg/dL (ref 8.9–10.3)
Creatinine, Ser: 1.98 mg/dL — ABNORMAL HIGH (ref 0.61–1.24)
GFR calc Af Amer: 35 mL/min — ABNORMAL LOW (ref >60)
GFR calc non Af Amer: 30 mL/min — ABNORMAL LOW (ref >60)
GLUCOSE: 118 mg/dL — AB (ref 65–99)
POTASSIUM: 4 mmol/L (ref 3.5–5.1)
SODIUM: 134 mmol/L — AB (ref 135–145)
Total Bilirubin: 1 mg/dL (ref 0.3–1.2)
Total Protein: 7 g/dL (ref 6.5–8.1)

## 2016-11-12 LAB — BRAIN NATRIURETIC PEPTIDE: B Natriuretic Peptide: 744.4 pg/mL — ABNORMAL HIGH (ref 0.0–100.0)

## 2016-11-12 MED ORDER — TORSEMIDE 20 MG PO TABS: 60.0000 mg | ORAL_TABLET | Freq: Two times a day (BID) | ORAL | 3 refills | Status: DC

## 2016-11-12 MED ORDER — LOSARTAN POTASSIUM 25 MG PO TABS: 12.5000 mg | ORAL_TABLET | Freq: Every day | ORAL | 3 refills | Status: DC

## 2016-11-12 MED ORDER — LOSARTAN POTASSIUM 25 MG PO TABS: 12.5000 mg | ORAL_TABLET | Freq: Two times a day (BID) | ORAL | 3 refills | Status: DC

## 2016-11-12 NOTE — Telephone Encounter (Signed)
Notes recorded by Modesta Messing, CMA on 11/12/2016 at 3:29 PM EDT Pt aware and agreeable. Labs added to 4/6 appt ------  Notes recorded by Laurey Morale, MD on 11/12/2016 at 1:23 PM EDT Call him and tell him not to increase losartan as creatinine is higher than in the past. Keep losartan at 12.5 mg daily. Increase torsemide as planned today. BMET next week.    Ref Range & Units 12:12 36mo ago 9mo ago   Sodium 135 - 145 mmol/L 134   129   128     Potassium 3.5 - 5.1 mmol/L 4.0  4.4  4.5    Chloride 101 - 111 mmol/L 99   93   90     CO2 22 - 32 mmol/L 25  25  28     Glucose, Bld 65 - 99 mg/dL   559   741     BUN 6 - 20 mg/dL 34   31   28     Creatinine, Ser 0.61 - 1.24 mg/dL 638   4.53   6.46     Calcium 8.9 - 10.3 mg/dL 9.0  9.4  9.2    Total Protein 6.5 - 8.1 g/dL 7.0  7.1     Albumin 3.5 - 5.0 g/dL 3.6  4.1     AST 15 - 41 U/L 22  25     ALT 17 - 63 U/L 26  21     Alkaline Phosphatase 38 - 126 U/L 69  78     Total Bilirubin 0.3 - 1.2 mg/dL 1.0  0.7     GFR calc non Af Amer >60 mL/min 30   37   37     GFR calc Af Amer >60 mL/min 35   43CM   42CM    Comments: (NOTE)

## 2016-11-12 NOTE — Telephone Encounter (Signed)
-----   Message from Laurey Morale, MD sent at 11/12/2016  1:23 PM EDT ----- Call him and tell him not to increase losartan as creatinine is higher than in the past.  Keep losartan at 12.5 mg daily.  Increase torsemide as planned today.  BMET next week.

## 2016-11-12 NOTE — Patient Instructions (Signed)
Routine lab work today. Will notify you of abnormal results, otherwise no news is good news!  EKG today.  INCREASE Losartan to 12.5 mg (1/2 tablet) TWICE daily.  INCREASE Torsemide to 80 mg (4 tabs) twice daily for TWO DAYS, then to 60 mg (3 tabs) twice daily every day.  Follow up next Friday with Otilio Saber PA-C with repeat lab work.  Do the following things EVERYDAY: 1) Weigh yourself in the morning before breakfast. Write it down and keep it in a log. 2) Take your medicines as prescribed 3) Eat low salt foods-Limit salt (sodium) to 2000 mg per day.  4) Stay as active as you can everyday 5) Limit all fluids for the day to less than 2 liters

## 2016-11-13 NOTE — Progress Notes (Signed)
Advanced Heart Failure Clinic Note   Primary Care: Lucila Maine MD Primary Cardiologist: Dr. [name] HF: [name]   HPI:  [name] is a 82 y.o. male with CAD s/p CABG, hypertension, hyperlipidemia and chronic systolic and diastolic heart failure.  Admitted in 1/18 with acute on chornic systolic HF complicated by cardiogenic shock.  Required support with both milrinone and norepinephrine. Developed transaminitis 2/2 shock liver.  He also developed atrial fibrillation with RVR requiring amiodarone to control.  Tolvaptan given for hyponatremia.  L/RHC as below.  Echo 08/26/16 LVEF 10-15%, Severe LAE, RV reduced, Severe RAE, Trivial pericardial perfusion. He was diuresed and titrated off milrinone/norepinephrine.    At baseline even prior to last hospitalization, he was limited to a motorized scooter due to severe knee arthritis.  Does very little walking.  He has noted more orthopnea and now sleeps on 2 pillows or sometimes in his recliner.  He is short of breath with transfers from his scooter. No chest pain.  No tachypalpitations.  He is in NSR today.  No BRBPR/melena.  Weight is up 16 lbs. No lightheadedness.   Significant dietary indiscretion, likes to eat pickles.   ECG (personally reviewed): NSR, IVCD QRS 136 msec  Labs 1/18: Hgb 11.8 => 10.2, digoxin 1.7 (dig decreased), Na 132, K 4.5, Creatinine 1.32 => 1.7 Labs 2/18: hgb 11.4, digoxin 1.2, TSH 11 but free T4 and free T3 normal, LFTs normal, K 4.4, creatinine 1.69  RHC/LHC (09/01/16) Coronary Findings  Dominance: Right Left Main: 50% ostial stenosis LAD: Totally occluded proximal LAD after D1. Medium-sized D1 patent with 40-50% stenosis proximally. LIMA-distal LAD patent. Proximal to the LIMA touchdown there are serial 80% stenoses in the mid LAD. Left Cx: LCx occluded proximally. OMs occluded proximally. Sequential SVG-OM1 and OM2 patent with good flow in target vessels. RCA: RCA occluded at the ostium. SVG-PDA patent,  backfills only to distal RCA.  Right Heart  RHC Procedural Findings: Hemodynamics (mmHg) RA mean 5 RV 40/8 PA 40/18 mean 27 PCWP mean 19 LV 88/24 AO 86/52 Oxygen saturations: PA 69% AO 98% Cardiac Output (Fick) 6.43  Cardiac Index (Fick) 3.12 Cardiac Output (Thermo) 3.86 Cardiac Index (Thermo) 1.9    Past Medical History:  Diagnosis Date  . Congestive heart failure, unspecified   . Coronary atherosclerosis of unspecified type of vessel, native or graft   . ICD (implantable cardiac defibrillator), single BSX    DOI 2008  . Ischemic cardiomyopathy   . Other and unspecified hyperlipidemia   . Peptic ulcer, unspecified site, unspecified as acute or chronic, without mention of hemorrhage, perforation, or obstruction   . Rheumatoid arthritis(714.0)   . Type II or unspecified type diabetes mellitus without mention of complication, not stated as uncontrolled   . Unspecified essential hypertension   . Ventricular tachycardia (HCC)    Rx via ICD 5 /2012    Current Outpatient Prescriptions  Medication Sig Dispense Refill  . ACCU-CHEK AVIVA PLUS test strip CHECK BLOOD SUGAR TWICE A DAY  1  . amiodarone (PACERONE) 200 MG tablet Take 1 tablet (200 mg total) by mouth daily. 90 tablet 3  . apixaban (ELIQUIS) 5 MG TABS tablet Take 1 tablet (5 mg total) by mouth 2 (two) times daily. 180 tablet 3  . atorvastatin (LIPITOR) 10 MG tablet Take 10 mg by mouth daily.      . bisacodyl (DULCOLAX) 5 MG EC tablet Take 1 tablet (5 mg total) by mouth daily as needed for moderate constipation. 30 tablet 0  .  carvedilol (COREG) 3.125 MG tablet Take 1 tablet (3.125 mg total) by mouth 2 (two) times daily with a meal. 180 tablet 3  . digoxin (LANOXIN) 0.125 MG tablet Take 1 tablet (0.125 mg total) by mouth daily. 90 tablet 3  . docusate sodium (COLACE) 100 MG capsule Take 1 capsule (100 mg total) by mouth daily. 10 capsule 0  . esomeprazole (NEXIUM) 40 MG capsule Take 40 mg by mouth daily.      .  hydrocortisone cream 1 % Apply topically as needed for itching. 30 g 0  . insulin aspart (NOVOLOG) 100 UNIT/ML injection Inject 0-15 Units into the skin 3 (three) times daily with meals. 10 mL 11  . insulin glargine (LANTUS) 100 UNIT/ML injection Inject 10-40 Units into the skin at bedtime as needed. Sliding scale.  If cbg= 110: none, cbg=200 take 25-30 units, cbg over 250 take 40 units    . LORazepam (ATIVAN) 0.5 MG tablet Take 1 tablet (0.5 mg total) by mouth every 6 (six) hours as needed for anxiety or sleep. 30 tablet 0  . menthol-cetylpyridinium (CEPACOL) 3 MG lozenge Take 1 lozenge (3 mg total) by mouth as needed for sore throat. 100 tablet 12  . polyethylene glycol (MIRALAX / GLYCOLAX) packet Take 17 g by mouth daily. 14 each 0  . potassium chloride SA (K-DUR,KLOR-CON) 20 MEQ tablet Take 2 tablets (40 mEq total) by mouth daily. 30 tablet 6  . spironolactone (ALDACTONE) 25 MG tablet Take 25 mg by mouth daily.    Marland Kitchen torsemide (DEMADEX) 20 MG tablet Take 3 tablets (60 mg total) by mouth 2 (two) times daily. 180 tablet 3  . benzonatate (TESSALON) 100 MG capsule Take 1 capsule (100 mg total) by mouth 3 (three) times daily as needed for cough. (Patient not taking: Reported on 11/12/2016) 20 capsule 0  . guaiFENesin-dextromethorphan (ROBITUSSIN DM) 100-10 MG/5ML syrup Take 5 mLs by mouth every 4 (four) hours as needed for cough. (Patient not taking: Reported on 11/12/2016) 118 mL 0  . losartan (COZAAR) 25 MG tablet Take 0.5 tablets (12.5 mg total) by mouth daily. 90 tablet 3   No current facility-administered medications for this encounter.     No Known Allergies    Social History   Social History  . Marital status: Married    Spouse name: N/A  . Number of children: N/A  . Years of education: N/A   Occupational History  . Not on file.   Social History Main Topics  . Smoking status: Never Smoker  . Smokeless tobacco: Never Used  . Alcohol use No  . Drug use: No  . Sexual activity: Not  on file   Other Topics Concern  . Not on file   Social History Narrative   Married. Has 1 surviving child and 4 surviving grandchildren.       Family History  Problem Relation Age of Onset  . Coronary artery disease Sister   . Coronary artery disease Brother     Vitals:   11/12/16 1112  BP: 109/61  Pulse: 84  SpO2: 100%  Weight: 211 lb 4 oz (95.8 kg)   Wt Readings from Last 3 Encounters:  11/12/16 211 lb 4 oz (95.8 kg)  10/08/16 195 lb (88.5 kg)  09/10/16 192 lb 3.2 oz (87.2 kg)    PHYSICAL EXAM: General:  Well appearing. No respiratory difficulty HEENT: normal Neck: supple. JVP 10-12. Carotids 2+ bilat; no bruits. No lymphadenopathy or thyromegaly appreciated. Cor: PMI nondisplaced. Regular rate & rhythm. No  rubs, gallops or murmurs. Lungs: clear to auscultation bilaterally.  Abdomen: soft, nontender, nondistended. No hepatosplenomegaly. No bruits or masses. Good bowel sounds. Extremities: no cyanosis, clubbing, rash.  1+ edema to knees bilaterally.  Neuro: alert & oriented x 3, cranial nerves grossly intact. moves all 4 extremities w/o difficulty. Affect pleasant.  ASSESSMENT & PLAN:  1. Chronic systolic CHF:  Ischemic cardiomyopathy.  Echo 08/26/16 with LVEF 10-15%, severe biatrial enlargement, decreased RV systolic function.  EF down from 45-50% in 2013.  [employer] ICD.  Admitted in 1/18 with cardiogenic shock, recovered.  He is minimally active at baseline due to severe arthritis.  Now with NYHA class III symptoms and significant weight gain.  He is volume overloaded on exam.  - Increase torsemide to 80 mg bid x 1 day then 60 mg bid after that.  BMET today and repeat in 1 week.   - Continue losartan 12.5 mg daily and spironolactone 25 mg daily.  - Continue digoxin 0.125 daily and check level.   - Continue Coreg 3.125 mg bid.  2. CKD stage III: BMET today.  3. CAD: s/p CABG.  Coronary angiography 1/18 with patent graft and no targets for revascularization.   No recent chest pain.   - Continue ASA and statin.  4. Atrial fibrillation:  NSR today.  No tachypalpitations.  - Continue amiodarone 200 mg daily.  LFTs today.  Recent TSH was elevated but free T3 and free T4 normal.  He will need a regular eye exam.  - Continue Eliquis 5 mg BID.   5. Osteoarthritis: Knee OA significantly limits activity.   Followup in 1 week with PA/NP.   Marca Ancona, MD 11/13/16

## 2016-11-19 ENCOUNTER — Other Ambulatory Visit (HOSPITAL_COMMUNITY): Payer: Self-pay

## 2016-11-19 ENCOUNTER — Encounter (HOSPITAL_COMMUNITY): Payer: Self-pay

## 2016-11-19 ENCOUNTER — Ambulatory Visit (HOSPITAL_COMMUNITY)
Admission: RE | Admit: 2016-11-19 | Discharge: 2016-11-19 | Disposition: A | Discharge status: Home / Self Care | Payer: Medicare Other | Source: Ambulatory Visit | Attending: Internal Medicine | Admitting: Internal Medicine

## 2016-11-19 VITALS — BP 106/62 | HR 74 | Wt 206.5 lb

## 2016-11-19 DIAGNOSIS — I480 Paroxysmal atrial fibrillation: Secondary | ICD-10-CM | POA: Diagnosis not present

## 2016-11-19 DIAGNOSIS — I5022 Chronic systolic (congestive) heart failure: Secondary | ICD-10-CM

## 2016-11-19 DIAGNOSIS — Z8711 Personal history of peptic ulcer disease: Secondary | ICD-10-CM | POA: Diagnosis not present

## 2016-11-19 DIAGNOSIS — Z7901 Long term (current) use of anticoagulants: Secondary | ICD-10-CM | POA: Diagnosis not present

## 2016-11-19 DIAGNOSIS — I5042 Chronic combined systolic (congestive) and diastolic (congestive) heart failure: Secondary | ICD-10-CM | POA: Diagnosis not present

## 2016-11-19 DIAGNOSIS — Z951 Presence of aortocoronary bypass graft: Secondary | ICD-10-CM | POA: Diagnosis not present

## 2016-11-19 DIAGNOSIS — E785 Hyperlipidemia, unspecified: Secondary | ICD-10-CM | POA: Insufficient documentation

## 2016-11-19 DIAGNOSIS — I130 Hypertensive heart and chronic kidney disease with heart failure and stage 1 through stage 4 chronic kidney disease, or unspecified chronic kidney disease: Secondary | ICD-10-CM | POA: Diagnosis not present

## 2016-11-19 DIAGNOSIS — M1990 Unspecified osteoarthritis, unspecified site: Secondary | ICD-10-CM | POA: Diagnosis not present

## 2016-11-19 DIAGNOSIS — Z794 Long term (current) use of insulin: Secondary | ICD-10-CM | POA: Diagnosis not present

## 2016-11-19 DIAGNOSIS — I2510 Atherosclerotic heart disease of native coronary artery without angina pectoris: Secondary | ICD-10-CM | POA: Insufficient documentation

## 2016-11-19 DIAGNOSIS — I255 Ischemic cardiomyopathy: Secondary | ICD-10-CM

## 2016-11-19 DIAGNOSIS — E871 Hypo-osmolality and hyponatremia: Secondary | ICD-10-CM | POA: Insufficient documentation

## 2016-11-19 DIAGNOSIS — E1122 Type 2 diabetes mellitus with diabetic chronic kidney disease: Secondary | ICD-10-CM | POA: Insufficient documentation

## 2016-11-19 DIAGNOSIS — I10 Essential (primary) hypertension: Secondary | ICD-10-CM | POA: Diagnosis not present

## 2016-11-19 DIAGNOSIS — M1710 Unilateral primary osteoarthritis, unspecified knee: Secondary | ICD-10-CM | POA: Diagnosis not present

## 2016-11-19 DIAGNOSIS — M069 Rheumatoid arthritis, unspecified: Secondary | ICD-10-CM | POA: Insufficient documentation

## 2016-11-19 DIAGNOSIS — I4891 Unspecified atrial fibrillation: Secondary | ICD-10-CM | POA: Diagnosis not present

## 2016-11-19 DIAGNOSIS — N183 Chronic kidney disease, stage 3 (moderate): Secondary | ICD-10-CM | POA: Insufficient documentation

## 2016-11-19 DIAGNOSIS — Z95810 Presence of automatic (implantable) cardiac defibrillator: Secondary | ICD-10-CM | POA: Diagnosis not present

## 2016-11-19 LAB — DIGOXIN LEVEL: DIGOXIN LVL: 0.6 ng/mL — AB (ref 0.8–2.0)

## 2016-11-19 LAB — BASIC METABOLIC PANEL
Anion gap: 13 (ref 5–15)
BUN: 40 mg/dL — AB (ref 6–20)
CALCIUM: 9.4 mg/dL (ref 8.9–10.3)
CO2: 27 mmol/L (ref 22–32)
Chloride: 93 mmol/L — ABNORMAL LOW (ref 101–111)
Creatinine, Ser: 1.82 mg/dL — ABNORMAL HIGH (ref 0.61–1.24)
GFR calc Af Amer: 39 mL/min — ABNORMAL LOW (ref >60)
GFR calc non Af Amer: 34 mL/min — ABNORMAL LOW (ref >60)
Glucose, Bld: 107 mg/dL — ABNORMAL HIGH (ref 65–99)
Potassium: 4 mmol/L (ref 3.5–5.1)
SODIUM: 133 mmol/L — AB (ref 135–145)

## 2016-11-19 LAB — BRAIN NATRIURETIC PEPTIDE: B NATRIURETIC PEPTIDE 5: 778.1 pg/mL — AB (ref 0.0–100.0)

## 2016-11-19 MED ORDER — TORSEMIDE 20 MG PO TABS: 60.0000 mg | ORAL_TABLET | Freq: Two times a day (BID) | ORAL | 3 refills | Status: DC

## 2016-11-19 MED ORDER — LOSARTAN POTASSIUM 25 MG PO TABS: 12.5000 mg | ORAL_TABLET | Freq: Every day | ORAL | 3 refills | Status: DC

## 2016-11-19 NOTE — Patient Instructions (Signed)
Refills sent for [facility]semide [facility].  Routine lab work today. Will notify you of abnormal results, otherwise no news is good news!  Today: Take Torsemide 80 mg (4 tabs) twice daily. Then reduce back to normal dose tomorrow: 60 mg (3 tabs) twice daily.  Follow up with Otilio Saber PA-C Friday April 20th at 9:30 am. [place] Code: 6000  Do the following things EVERYDAY: 1) Weigh yourself in the morning before breakfast. Write it down and keep it in a log. 2) Take your medicines as prescribed 3) Eat low salt foods-Limit salt (sodium) to 2000 mg per day.  4) Stay as active as you can everyday 5) Limit all fluids for the day to less than 2 liters

## 2016-11-19 NOTE — Progress Notes (Signed)
Advanced Heart Failure Clinic Note   Primary Care: Raymond Maine MD Primary Cardiologist: Dr. [name] HF: [name]   HPI:  [name] is a 82 y.o. male with CAD s/p CABG, hypertension, hyperlipidemia and chronic systolic and diastolic heart failure.  Admitted in 1/18 with acute on chornic systolic HF complicated by cardiogenic shock.  Required support with both milrinone and norepinephrine. Developed transaminitis 2/2 shock liver.  He also developed atrial fibrillation with RVR requiring amiodarone to control.  Tolvaptan given for hyponatremia.  L/RHC as below.  Echo 08/26/16 LVEF 10-15%, Severe LAE, RV reduced, Severe RAE, Trivial pericardial perfusion. He was diuresed and titrated off milrinone/norepinephrine.    At baseline even prior to last hospitalization, he was limited to a motorized scooter due to severe knee arthritis.  Does very little walking.  He has noted more orthopnea and now sleeps on 2 pillows or sometimes in his recliner.  He is short of breath with transfers from his scooter. No chest pain.  No tachypalpitations.  He is in NSR today.  No BRBPR/melena.  Weight is up 16 lbs. No lightheadedness.   Significant dietary indiscretion, likes to eat pickles.   [name] presents today for a 1 week follow up. At last visit torsemide increased for a day with volume overloaded. Not SOB with transfers any longer, mostly limited from pain in legs. Still sleeps on 2 pillows but less orthopneic.  NO CP, tachypalpitation. No BRBPR/Melena. Weight down 5 lbs from last week. Weight at home 202 lbs. Does still have some bendopnea, but otherwise no SOB changing clothes or bathing.   ECG (personally reviewed): NSR, IVCD QRS 136 msec  Labs 1/18: Hgb 11.8 => 10.2, digoxin 1.7 (dig decreased), Na 132, K 4.5, Creatinine 1.32 => 1.7 Labs 2/18: hgb 11.4, digoxin 1.2, TSH 11 but free T4 and free T3 normal, LFTs normal, K 4.4, creatinine 1.69  RHC/LHC (09/01/16) Coronary Findings  Dominance:  Right Left Main: 50% ostial stenosis LAD: Totally occluded proximal LAD after D1. Medium-sized D1 patent with 40-50% stenosis proximally. LIMA-distal LAD patent. Proximal to the LIMA touchdown there are serial 80% stenoses in the mid LAD. Left Cx: LCx occluded proximally. OMs occluded proximally. Sequential SVG-OM1 and OM2 patent with good flow in target vessels. RCA: RCA occluded at the ostium. SVG-PDA patent, backfills only to distal RCA.  Right Heart  RHC Procedural Findings: Hemodynamics (mmHg) RA mean 5 RV 40/8 PA 40/18 mean 27 PCWP mean 19 LV 88/24 AO 86/52 Oxygen saturations: PA 69% AO 98% Cardiac Output (Fick) 6.43  Cardiac Index (Fick) 3.12 Cardiac Output (Thermo) 3.86 Cardiac Index (Thermo) 1.9    Past Medical History:  Diagnosis Date  . Congestive heart failure, unspecified   . Coronary atherosclerosis of unspecified type of vessel, native or graft   . ICD (implantable cardiac defibrillator), single BSX    DOI 2008  . Ischemic cardiomyopathy   . Other and unspecified hyperlipidemia   . Peptic ulcer, unspecified site, unspecified as acute or chronic, without mention of hemorrhage, perforation, or obstruction   . Rheumatoid arthritis(714.0)   . Type II or unspecified type diabetes mellitus without mention of complication, not stated as uncontrolled   . Unspecified essential hypertension   . Ventricular tachycardia (HCC)    Rx via ICD 5 /2012    Current Outpatient Prescriptions  Medication Sig Dispense Refill  . ACCU-CHEK AVIVA PLUS test strip CHECK BLOOD SUGAR TWICE A DAY  1  . amiodarone (PACERONE) 200 MG tablet Take 1 tablet (  200 mg total) by mouth daily. 90 tablet 3  . apixaban (ELIQUIS) 5 MG TABS tablet Take 1 tablet (5 mg total) by mouth 2 (two) times daily. 180 tablet 3  . atorvastatin (LIPITOR) 10 MG tablet Take 10 mg by mouth daily.      . benzonatate (TESSALON) 100 MG capsule Take 1 capsule (100 mg total) by mouth 3 (three) times daily as needed for  cough. 20 capsule 0  . bisacodyl (DULCOLAX) 5 MG EC tablet Take 1 tablet (5 mg total) by mouth daily as needed for moderate constipation. 30 tablet 0  . carvedilol (COREG) 3.125 MG tablet Take 1 tablet (3.125 mg total) by mouth 2 (two) times daily with a meal. 180 tablet 3  . digoxin (LANOXIN) 0.125 MG tablet Take 1 tablet (0.125 mg total) by mouth daily. 90 tablet 3  . docusate sodium (COLACE) 100 MG capsule Take 1 capsule (100 mg total) by mouth daily. 10 capsule 0  . esomeprazole (NEXIUM) 40 MG capsule Take 40 mg by mouth daily.      Marland Kitchen guaiFENesin-dextromethorphan (ROBITUSSIN DM) 100-10 MG/5ML syrup Take 5 mLs by mouth every 4 (four) hours as needed for cough. 118 mL 0  . hydrocortisone cream 1 % Apply topically as needed for itching. 30 g 0  . insulin aspart (NOVOLOG) 100 UNIT/ML injection Inject 0-15 Units into the skin 3 (three) times daily with meals. 10 mL 11  . insulin glargine (LANTUS) 100 UNIT/ML injection Inject 10-40 Units into the skin at bedtime as needed. Sliding scale.  If cbg= 110: none, cbg=200 take 25-30 units, cbg over 250 take 40 units    . LORazepam (ATIVAN) 0.5 MG tablet Take 1 tablet (0.5 mg total) by mouth every 6 (six) hours as needed for anxiety or sleep. 30 tablet 0  . losartan (COZAAR) 25 MG tablet Take 0.5 tablets (12.5 mg total) by mouth daily. 90 tablet 3  . menthol-cetylpyridinium (CEPACOL) 3 MG lozenge Take 1 lozenge (3 mg total) by mouth as needed for sore throat. 100 tablet 12  . polyethylene glycol (MIRALAX / GLYCOLAX) packet Take 17 g by mouth daily. 14 each 0  . potassium chloride SA (K-DUR,KLOR-CON) 20 MEQ tablet Take 2 tablets (40 mEq total) by mouth daily. 30 tablet 6  . spironolactone (ALDACTONE) 25 MG tablet Take 25 mg by mouth daily.    Marland Kitchen torsemide (DEMADEX) 20 MG tablet Take 3 tablets (60 mg total) by mouth 2 (two) times daily. 180 tablet 3   No current facility-administered medications for this encounter.     No Known Allergies    Social History    Social History  . Marital status: Married    Spouse name: N/A  . Number of children: N/A  . Years of education: N/A   Occupational History  . Not on file.   Social History Main Topics  . Smoking status: Never Smoker  . Smokeless tobacco: Never Used  . Alcohol use No  . Drug use: No  . Sexual activity: Not on file   Other Topics Concern  . Not on file   Social History Narrative   Married. Has 1 surviving child and 4 surviving grandchildren.       Family History  Problem Relation Age of Onset  . Coronary artery disease Sister   . Coronary artery disease Brother     Vitals:   11/19/16 0841  BP: 106/62  Pulse: 74  SpO2: 99%  Weight: 206 lb 8 oz (93.7 kg)  Wt Readings from Last 3 Encounters:  11/19/16 206 lb 8 oz (93.7 kg)  11/12/16 211 lb 4 oz (95.8 kg)  10/08/16 195 lb (88.5 kg)    PHYSICAL EXAM:  General: Elderly caucasian male in NAD.  HEENT: Normal Neck: Supple. JVP 8-9 cm with mild HJR. Carotids 2+ bilat; no bruits. No thyromegaly or nodule noted.   Cor: PMI nondisplaced. RRR, No M/G/R noted Lungs: CTAB, normal effort. Abdomen: Soft, non-tender, mildly distended, no HSM. No bruits or masses. +BS  Extremities: No cyanosis, clubbing, rash, Trace to 1+ edema 2/3 way to knees.  Neuro: alert & orientedx3, cranial nerves grossly intact. moves all 4 extremities w/o difficulty. Affect pleasant   ASSESSMENT & PLAN:  1. Chronic systolic CHF:  Ischemic cardiomyopathy.  Echo 08/26/16 with LVEF 10-15%, severe biatrial enlargement, decreased RV systolic function.  EF down from 45-50% in 2013.  [employer] ICD.  Admitted in 1/18 with cardiogenic shock, recovered.  He is minimally active at baseline due to severe arthritis.  -  NYHA class III symtpoms.  - Volume remains mildly elevated. ReDS vest 39%.   - Take torsemide 80 mg BID today and then 60 mg daily after that. Can repeat as needed.  BMET today.  - Continue losartan 12.5 mg daily and spironolactone 25  mg daily.  - Continue digoxin 0.125 daily. Check level today.  - Continue Coreg 3.125 mg bid.  - Reinforced fluid restriction to < 2 L daily, sodium restriction to less than 2000 mg daily, and the importance of daily weights.   2. CKD stage III:  - BMET today.   3. CAD: s/p CABG.  Coronary angiography 1/18 with patent graft and no targets for revascularization.  - Denies any chest pain. Continue ASA and statin.    4. Atrial fibrillation:   - NSR by exam today. No palpitations.  - Continue amiodarone 200 mg daily. Recent TSH was elevated but free T3 and free T4 normal.   - Needs regular eye exam.  - Continue Eliquis 5 mg BID.  Denies bleeding.  5. Osteoarthritis:  - Knee OA significantly limits activity. No change.   Torsemide and labs as above. Follow up 2 weeks.   Graciella Freer, PA-C 11/19/16   Greater than 50% of the 25 minute visit was spent in counseling/coordination of care regarding disease state education, fluid/salt restriction, and sliding scale diuretics.

## 2016-12-03 ENCOUNTER — Encounter (HOSPITAL_COMMUNITY): Payer: Self-pay

## 2016-12-03 ENCOUNTER — Ambulatory Visit (HOSPITAL_COMMUNITY)
Admission: RE | Admit: 2016-12-03 | Discharge: 2016-12-03 | Disposition: A | Discharge status: Home / Self Care | Payer: Medicare Other | Source: Ambulatory Visit | Attending: Cardiology | Admitting: Cardiology

## 2016-12-03 VITALS — BP 110/62 | HR 83 | Wt 197.5 lb

## 2016-12-03 DIAGNOSIS — Z794 Long term (current) use of insulin: Secondary | ICD-10-CM | POA: Insufficient documentation

## 2016-12-03 DIAGNOSIS — M069 Rheumatoid arthritis, unspecified: Secondary | ICD-10-CM | POA: Diagnosis not present

## 2016-12-03 DIAGNOSIS — I255 Ischemic cardiomyopathy: Secondary | ICD-10-CM | POA: Insufficient documentation

## 2016-12-03 DIAGNOSIS — I5042 Chronic combined systolic (congestive) and diastolic (congestive) heart failure: Secondary | ICD-10-CM | POA: Diagnosis not present

## 2016-12-03 DIAGNOSIS — M1990 Unspecified osteoarthritis, unspecified site: Secondary | ICD-10-CM | POA: Diagnosis not present

## 2016-12-03 DIAGNOSIS — Z8711 Personal history of peptic ulcer disease: Secondary | ICD-10-CM | POA: Insufficient documentation

## 2016-12-03 DIAGNOSIS — N183 Chronic kidney disease, stage 3 unspecified: Secondary | ICD-10-CM

## 2016-12-03 DIAGNOSIS — Z7901 Long term (current) use of anticoagulants: Secondary | ICD-10-CM | POA: Insufficient documentation

## 2016-12-03 DIAGNOSIS — I4891 Unspecified atrial fibrillation: Secondary | ICD-10-CM | POA: Insufficient documentation

## 2016-12-03 DIAGNOSIS — Z95810 Presence of automatic (implantable) cardiac defibrillator: Secondary | ICD-10-CM | POA: Diagnosis not present

## 2016-12-03 DIAGNOSIS — I480 Paroxysmal atrial fibrillation: Secondary | ICD-10-CM | POA: Diagnosis not present

## 2016-12-03 DIAGNOSIS — I130 Hypertensive heart and chronic kidney disease with heart failure and stage 1 through stage 4 chronic kidney disease, or unspecified chronic kidney disease: Secondary | ICD-10-CM | POA: Insufficient documentation

## 2016-12-03 DIAGNOSIS — I10 Essential (primary) hypertension: Secondary | ICD-10-CM | POA: Diagnosis not present

## 2016-12-03 DIAGNOSIS — I2510 Atherosclerotic heart disease of native coronary artery without angina pectoris: Secondary | ICD-10-CM | POA: Insufficient documentation

## 2016-12-03 DIAGNOSIS — I5022 Chronic systolic (congestive) heart failure: Secondary | ICD-10-CM

## 2016-12-03 DIAGNOSIS — Z951 Presence of aortocoronary bypass graft: Secondary | ICD-10-CM | POA: Insufficient documentation

## 2016-12-03 DIAGNOSIS — E1122 Type 2 diabetes mellitus with diabetic chronic kidney disease: Secondary | ICD-10-CM | POA: Insufficient documentation

## 2016-12-03 DIAGNOSIS — M1710 Unilateral primary osteoarthritis, unspecified knee: Secondary | ICD-10-CM | POA: Insufficient documentation

## 2016-12-03 DIAGNOSIS — E785 Hyperlipidemia, unspecified: Secondary | ICD-10-CM | POA: Insufficient documentation

## 2016-12-03 LAB — BASIC METABOLIC PANEL
Anion gap: 11 (ref 5–15)
BUN: 38 mg/dL — AB (ref 6–20)
CHLORIDE: 91 mmol/L — AB (ref 101–111)
CO2: 28 mmol/L (ref 22–32)
CREATININE: 2.11 mg/dL — AB (ref 0.61–1.24)
Calcium: 9.5 mg/dL (ref 8.9–10.3)
GFR calc Af Amer: 33 mL/min — ABNORMAL LOW (ref >60)
GFR calc non Af Amer: 28 mL/min — ABNORMAL LOW (ref >60)
GLUCOSE: 70 mg/dL (ref 65–99)
POTASSIUM: 4.1 mmol/L (ref 3.5–5.1)
SODIUM: 130 mmol/L — AB (ref 135–145)

## 2016-12-03 LAB — CBC
HEMATOCRIT: 31.6 % — AB (ref 39.0–52.0)
HEMOGLOBIN: 10.6 g/dL — AB (ref 13.0–17.0)
MCH: 28.7 pg (ref 26.0–34.0)
MCHC: 33.5 g/dL (ref 30.0–36.0)
MCV: 85.6 fL (ref 78.0–100.0)
Platelets: 238 10*3/uL (ref 150–400)
RBC: 3.69 MIL/uL — ABNORMAL LOW (ref 4.22–5.81)
RDW: 14.3 % (ref 11.5–15.5)
WBC: 6.2 10*3/uL (ref 4.0–10.5)

## 2016-12-03 LAB — BRAIN NATRIURETIC PEPTIDE: B Natriuretic Peptide: 568.5 pg/mL — ABNORMAL HIGH (ref 0.0–100.0)

## 2016-12-03 MED ORDER — CARVEDILOL 6.25 MG PO TABS: 6.2500 mg | ORAL_TABLET | Freq: Two times a day (BID) | ORAL | 3 refills | Status: DC

## 2016-12-03 NOTE — Progress Notes (Signed)
Advanced Heart Failure Clinic Note   Primary Care: Lucila Maine MD Primary Cardiologist: Dr. [name] HF: [name]   HPI:  [name] is a 82 y.o. male with CAD s/p CABG, hypertension, hyperlipidemia and chronic systolic and diastolic heart failure.  Admitted in 1/18 with acute on chornic systolic HF complicated by cardiogenic shock.  Required support with both milrinone and norepinephrine. Developed transaminitis 2/2 shock liver.  He also developed atrial fibrillation with RVR requiring amiodarone to control.  Tolvaptan given for hyponatremia.  L/RHC as below.  Echo 08/26/16 LVEF 10-15%, Severe LAE, RV reduced, Severe RAE, Trivial pericardial perfusion. He was diuresed and titrated off milrinone/norepinephrine.    At baseline even prior to last hospitalization, he was limited to a motorized scooter due to severe knee arthritis.  Does very little walking.  He has noted more orthopnea and now sleeps on 2 pillows or sometimes in his recliner.  He is short of breath with transfers from his scooter. No chest pain.  No tachypalpitations.  He is in NSR today.  No BRBPR/melena.  Weight is up 16 lbs. No lightheadedness.   Significant dietary indiscretion, likes to eat pickles.   Pt presents today for regular follow up. Last visit torsemide increased. Weight down 9 lbs from last visit. Weight at home 193. Breathing is fine when his fluid is down.  Has a cold for about 2 weeks.  No fevers or chills. No N/V, diarrhea, or near-syncope.  Denies CP. Uses a motorized chair to get around, even in the house.  Mostly limited by his severe arthritis. No SOB with transfers, or ADLs.  ECG (personally reviewed): NSR, IVCD QRS 136 msec  Labs 1/18: Hgb 11.8 => 10.2, digoxin 1.7 (dig decreased), Na 132, K 4.5, Creatinine 1.32 => 1.7 Labs 2/18: hgb 11.4, digoxin 1.2, TSH 11 but free T4 and free T3 normal, LFTs normal, K 4.4, creatinine 1.69  RHC/LHC (09/01/16) Coronary Findings  Dominance: Right Left Main: 50%  ostial stenosis LAD: Totally occluded proximal LAD after D1. Medium-sized D1 patent with 40-50% stenosis proximally. LIMA-distal LAD patent. Proximal to the LIMA touchdown there are serial 80% stenoses in the mid LAD. Left Cx: LCx occluded proximally. OMs occluded proximally. Sequential SVG-OM1 and OM2 patent with good flow in target vessels. RCA: RCA occluded at the ostium. SVG-PDA patent, backfills only to distal RCA.  Right Heart  RHC Procedural Findings: Hemodynamics (mmHg) RA mean 5 RV 40/8 PA 40/18 mean 27 PCWP mean 19 LV 88/24 AO 86/52 Oxygen saturations: PA 69% AO 98% Cardiac Output (Fick) 6.43  Cardiac Index (Fick) 3.12 Cardiac Output (Thermo) 3.86 Cardiac Index (Thermo) 1.9    Past Medical History:  Diagnosis Date  . Congestive heart failure, unspecified   . Coronary atherosclerosis of unspecified type of vessel, native or graft   . ICD (implantable cardiac defibrillator), single BSX    DOI 2008  . Ischemic cardiomyopathy   . Other and unspecified hyperlipidemia   . Peptic ulcer, unspecified site, unspecified as acute or chronic, without mention of hemorrhage, perforation, or obstruction   . Rheumatoid arthritis(714.0)   . Type II or unspecified type diabetes mellitus without mention of complication, not stated as uncontrolled   . Unspecified essential hypertension   . Ventricular tachycardia (HCC)    Rx via ICD 5 /2012    Current Outpatient Prescriptions  Medication Sig Dispense Refill  . ACCU-CHEK AVIVA PLUS test strip CHECK BLOOD SUGAR TWICE A DAY  1  . amiodarone (PACERONE) 200 MG tablet Take  1 tablet (200 mg total) by mouth daily. 90 tablet 3  . apixaban (ELIQUIS) 5 MG TABS tablet Take 1 tablet (5 mg total) by mouth 2 (two) times daily. 180 tablet 3  . atorvastatin (LIPITOR) 10 MG tablet Take 10 mg by mouth daily.      . benzonatate (TESSALON) 100 MG capsule Take 1 capsule (100 mg total) by mouth 3 (three) times daily as needed for cough. 20 capsule 0  .  bisacodyl (DULCOLAX) 5 MG EC tablet Take 1 tablet (5 mg total) by mouth daily as needed for moderate constipation. 30 tablet 0  . carvedilol (COREG) 3.125 MG tablet Take 1 tablet (3.125 mg total) by mouth 2 (two) times daily with a meal. 180 tablet 3  . cefdinir (OMNICEF) 300 MG capsule Take 300 mg by mouth 2 (two) times daily.    . digoxin (LANOXIN) 0.125 MG tablet Take 1 tablet (0.125 mg total) by mouth daily. 90 tablet 3  . docusate sodium (COLACE) 100 MG capsule Take 1 capsule (100 mg total) by mouth daily. 10 capsule 0  . esomeprazole (NEXIUM) 40 MG capsule Take 40 mg by mouth daily.      Marland Kitchen guaiFENesin-dextromethorphan (ROBITUSSIN DM) 100-10 MG/5ML syrup Take 5 mLs by mouth every 4 (four) hours as needed for cough. 118 mL 0  . hydrocortisone cream 1 % Apply topically as needed for itching. 30 g 0  . insulin aspart (NOVOLOG) 100 UNIT/ML injection Inject 0-15 Units into the skin 3 (three) times daily with meals. 10 mL 11  . insulin glargine (LANTUS) 100 UNIT/ML injection Inject 10-40 Units into the skin at bedtime as needed. Sliding scale.  If cbg= 110: none, cbg=200 take 25-30 units, cbg over 250 take 40 units    . ipratropium (ATROVENT) 0.02 % nebulizer solution Take 0.5 mg by nebulization 4 (four) times daily.    Marland Kitchen LORazepam (ATIVAN) 0.5 MG tablet Take 1 tablet (0.5 mg total) by mouth every 6 (six) hours as needed for anxiety or sleep. 30 tablet 0  . losartan (COZAAR) 25 MG tablet Take 0.5 tablets (12.5 mg total) by mouth daily. 45 tablet 3  . menthol-cetylpyridinium (CEPACOL) 3 MG lozenge Take 1 lozenge (3 mg total) by mouth as needed for sore throat. 100 tablet 12  . polyethylene glycol (MIRALAX / GLYCOLAX) packet Take 17 g by mouth daily. 14 each 0  . potassium chloride SA (K-DUR,KLOR-CON) 20 MEQ tablet Take 2 tablets (40 mEq total) by mouth daily. 30 tablet 6  . spironolactone (ALDACTONE) 25 MG tablet Take 25 mg by mouth daily.    Marland Kitchen torsemide (DEMADEX) 20 MG tablet Take 3 tablets (60 mg  total) by mouth 2 (two) times daily. 540 tablet 3   No current facility-administered medications for this encounter.     No Known Allergies    Social History   Social History  . Marital status: Married    Spouse name: N/A  . Number of children: N/A  . Years of education: N/A   Occupational History  . Not on file.   Social History Main Topics  . Smoking status: Never Smoker  . Smokeless tobacco: Never Used  . Alcohol use No  . Drug use: No  . Sexual activity: Not on file   Other Topics Concern  . Not on file   Social History Narrative   Married. Has 1 surviving child and 4 surviving grandchildren.       Family History  Problem Relation Age of Onset  .  Coronary artery disease Sister   . Coronary artery disease Brother     Vitals:   12/03/16 0919  BP: 110/62  Pulse: 83  SpO2: 99%  Weight: 197 lb 8 oz (89.6 kg)   Wt Readings from Last 3 Encounters:  12/03/16 197 lb 8 oz (89.6 kg)  11/19/16 206 lb 8 oz (93.7 kg)  11/12/16 211 lb 4 oz (95.8 kg)    PHYSICAL EXAM:  General: Elderly but well appearing. No resp difficulty. HEENT: normal Neck: supple. JVD 7-8. Carotids 2+ bilat; no bruits. No thyromegaly or nodule noted. Cor: PMI nondisplaced. RRR, No M/G/R noted Lungs: CTAB, normal effort. Abdomen: soft, non-tender, distended, no HSM. No bruits or masses. +BS  Extremities: no cyanosis, clubbing, rash, Trace ankle edema.  Neuro: alert & orientedx3, cranial nerves grossly intact. moves all 4 extremities w/o difficulty. Affect pleasant   ASSESSMENT & PLAN:  1. Chronic systolic CHF:  Ischemic cardiomyopathy.  Echo 08/26/16 with LVEF 10-15%, severe biatrial enlargement, decreased RV systolic function.  EF down from 45-50% in 2013.  [employer] ICD.  Admitted in 1/18 with cardiogenic shock, recovered.  He is minimally active at baseline due to severe arthritis.  -  NYHA class III symptoms but difficult to assess due to his symtpoms.  - Volume improved on  exam.  - Continue torsemide 60 mg BID.   - Continue losartan 12.5 mg daily and spironolactone 25 mg daily.  - Continue digoxin 0.125 daily. Level stable at lat visit.   - Increase coreg to 6.25 mg BID.   - Reinforced fluid restriction to < 2 L daily, sodium restriction to less than 2000 mg daily, and the importance of daily weights.   2. CKD stage III:  - BMET today.    3. CAD: s/p CABG.  Coronary angiography 1/18 with patent graft and no targets for revascularization.  - Denies any chest pain. Continue ASA and statin.   No change.  4. Atrial fibrillation:   - NSR by exam today. No palpitations.   - Continue amiodarone 200 mg daily. Recent TSH was elevated but free T3 and free T4 normal.   - Needs regular eye exam.   - Continue Eliquis 5 mg BID.  NO bleeding.  5. Osteoarthritis:  - Knee OA significantly limits activity. No change.    Meds as above. Labs today. RTC 4-6 weeks   Luane School 12/03/16   Greater than 50% of the 25 minute visit was spent in counseling/coordination of care regarding disease state education, fluid/diet restriction, and discussion of medical regimen with on site Pharm-D.

## 2016-12-03 NOTE — Patient Instructions (Signed)
INCREASE Carvedilol to 6.25mg  twice daily.  Routine lab work today. Will notify you of abnormal results  Follow up in 4-6 weeks

## 2016-12-31 ENCOUNTER — Encounter (HOSPITAL_COMMUNITY): Payer: Self-pay

## 2016-12-31 ENCOUNTER — Ambulatory Visit (HOSPITAL_COMMUNITY)
Admission: RE | Admit: 2016-12-31 | Discharge: 2016-12-31 | Disposition: A | Discharge status: Home / Self Care | Payer: Medicare Other | Source: Ambulatory Visit | Attending: Cardiology | Admitting: Cardiology

## 2016-12-31 VITALS — BP 83/42 | HR 72 | Wt 195.4 lb

## 2016-12-31 DIAGNOSIS — I2510 Atherosclerotic heart disease of native coronary artery without angina pectoris: Secondary | ICD-10-CM | POA: Diagnosis not present

## 2016-12-31 DIAGNOSIS — M179 Osteoarthritis of knee, unspecified: Secondary | ICD-10-CM | POA: Insufficient documentation

## 2016-12-31 DIAGNOSIS — Z794 Long term (current) use of insulin: Secondary | ICD-10-CM | POA: Insufficient documentation

## 2016-12-31 DIAGNOSIS — N183 Chronic kidney disease, stage 3 unspecified: Secondary | ICD-10-CM

## 2016-12-31 DIAGNOSIS — I480 Paroxysmal atrial fibrillation: Secondary | ICD-10-CM | POA: Diagnosis not present

## 2016-12-31 DIAGNOSIS — M1990 Unspecified osteoarthritis, unspecified site: Secondary | ICD-10-CM

## 2016-12-31 DIAGNOSIS — M069 Rheumatoid arthritis, unspecified: Secondary | ICD-10-CM | POA: Diagnosis not present

## 2016-12-31 DIAGNOSIS — Z951 Presence of aortocoronary bypass graft: Secondary | ICD-10-CM | POA: Insufficient documentation

## 2016-12-31 DIAGNOSIS — I959 Hypotension, unspecified: Secondary | ICD-10-CM | POA: Insufficient documentation

## 2016-12-31 DIAGNOSIS — I130 Hypertensive heart and chronic kidney disease with heart failure and stage 1 through stage 4 chronic kidney disease, or unspecified chronic kidney disease: Secondary | ICD-10-CM | POA: Insufficient documentation

## 2016-12-31 DIAGNOSIS — E1122 Type 2 diabetes mellitus with diabetic chronic kidney disease: Secondary | ICD-10-CM | POA: Diagnosis not present

## 2016-12-31 DIAGNOSIS — I4891 Unspecified atrial fibrillation: Secondary | ICD-10-CM | POA: Diagnosis not present

## 2016-12-31 DIAGNOSIS — Z8711 Personal history of peptic ulcer disease: Secondary | ICD-10-CM | POA: Diagnosis not present

## 2016-12-31 DIAGNOSIS — Z7901 Long term (current) use of anticoagulants: Secondary | ICD-10-CM | POA: Diagnosis not present

## 2016-12-31 DIAGNOSIS — I952 Hypotension due to drugs: Secondary | ICD-10-CM

## 2016-12-31 DIAGNOSIS — E785 Hyperlipidemia, unspecified: Secondary | ICD-10-CM | POA: Insufficient documentation

## 2016-12-31 DIAGNOSIS — Z95810 Presence of automatic (implantable) cardiac defibrillator: Secondary | ICD-10-CM | POA: Insufficient documentation

## 2016-12-31 DIAGNOSIS — Z79899 Other long term (current) drug therapy: Secondary | ICD-10-CM | POA: Diagnosis not present

## 2016-12-31 DIAGNOSIS — I255 Ischemic cardiomyopathy: Secondary | ICD-10-CM | POA: Diagnosis not present

## 2016-12-31 DIAGNOSIS — I5022 Chronic systolic (congestive) heart failure: Secondary | ICD-10-CM | POA: Diagnosis not present

## 2016-12-31 DIAGNOSIS — Z8249 Family history of ischemic heart disease and other diseases of the circulatory system: Secondary | ICD-10-CM | POA: Diagnosis not present

## 2016-12-31 LAB — BASIC METABOLIC PANEL
Anion gap: 13 (ref 5–15)
BUN: 36 mg/dL — AB (ref 6–20)
CALCIUM: 9 mg/dL (ref 8.9–10.3)
CHLORIDE: 94 mmol/L — AB (ref 101–111)
CO2: 25 mmol/L (ref 22–32)
CREATININE: 2.09 mg/dL — AB (ref 0.61–1.24)
GFR calc non Af Amer: 28 mL/min — ABNORMAL LOW (ref >60)
GFR, EST AFRICAN AMERICAN: 33 mL/min — AB (ref >60)
GLUCOSE: 123 mg/dL — AB (ref 65–99)
Potassium: 4 mmol/L (ref 3.5–5.1)
Sodium: 132 mmol/L — ABNORMAL LOW (ref 135–145)

## 2016-12-31 LAB — BRAIN NATRIURETIC PEPTIDE: B NATRIURETIC PEPTIDE 5: 408.2 pg/mL — AB (ref 0.0–100.0)

## 2016-12-31 MED ORDER — CARVEDILOL 3.125 MG PO TABS: 3.1250 mg | ORAL_TABLET | Freq: Two times a day (BID) | ORAL | 3 refills | Status: DC

## 2016-12-31 NOTE — Progress Notes (Signed)
Advanced Heart Failure Clinic Note   Primary Care: Lucila Maine MD Primary Cardiologist: Dr. [name] HF: [name]   HPI:  [name] is a 82 y.o. male with CAD s/p CABG, hypertension, hyperlipidemia and chronic systolic and diastolic heart failure.  Admitted in 1/18 with acute on chornic systolic HF complicated by cardiogenic shock.  Required support with both milrinone and norepinephrine. Developed transaminitis 2/2 shock liver.  He also developed atrial fibrillation with RVR requiring amiodarone to control.  Tolvaptan given for hyponatremia.  L/RHC as below.  Echo 08/26/16 LVEF 10-15%, Severe LAE, RV reduced, Severe RAE, Trivial pericardial perfusion. He was diuresed and titrated off milrinone/norepinephrine.    At baseline even prior to last hospitalization, he was limited to a motorized scooter due to severe knee arthritis.  Does very little walking.  He has noted more orthopnea and now sleeps on 2 pillows or sometimes in his recliner.  He is short of breath with transfers from his scooter. No chest pain.  No tachypalpitations.  He is in NSR today.  No BRBPR/melena.  Weight is up 16 lbs. No lightheadedness.   Significant dietary indiscretion, likes to eat pickles.   He presents today for regular follow up. Last visit carvedilol increased. Weight down 2 more lbs since last visit.  Weight at home ~ 192-195. Mostly just transfers from chair at home.  SOB showering. Uses a motorized wheel chair. Denies CP or palpitations.  Mild orthopnea at times, but not often.  Only when his fluid is up.  No SOB with ADLs, but as above not very mobile. Mostly limited by arthritis. Just got over bronchitis. Had ABX.  Denies N/V, fever, chills, or diarrhea.   ECG (personally reviewed): NSR, IVCD QRS 136 msec  Labs 1/18: Hgb 11.8 => 10.2, digoxin 1.7 (dig decreased), Na 132, K 4.5, Creatinine 1.32 => 1.7 Labs 2/18: hgb 11.4, digoxin 1.2, TSH 11 but free T4 and free T3 normal, LFTs normal, K 4.4, creatinine  1.69  RHC/LHC (09/01/16) Coronary Findings  Dominance: Right Left Main: 50% ostial stenosis LAD: Totally occluded proximal LAD after D1. Medium-sized D1 patent with 40-50% stenosis proximally. LIMA-distal LAD patent. Proximal to the LIMA touchdown there are serial 80% stenoses in the mid LAD. Left Cx: LCx occluded proximally. OMs occluded proximally. Sequential SVG-OM1 and OM2 patent with good flow in target vessels. RCA: RCA occluded at the ostium. SVG-PDA patent, backfills only to distal RCA.  Right Heart  RHC Procedural Findings: Hemodynamics (mmHg) RA mean 5 RV 40/8 PA 40/18 mean 27 PCWP mean 19 LV 88/24 AO 86/52 Oxygen saturations: PA 69% AO 98% Cardiac Output (Fick) 6.43  Cardiac Index (Fick) 3.12 Cardiac Output (Thermo) 3.86 Cardiac Index (Thermo) 1.9    Past Medical History:  Diagnosis Date  . Congestive heart failure, unspecified   . Coronary atherosclerosis of unspecified type of vessel, native or graft   . ICD (implantable cardiac defibrillator), single BSX    DOI 2008  . Ischemic cardiomyopathy   . Other and unspecified hyperlipidemia   . Peptic ulcer, unspecified site, unspecified as acute or chronic, without mention of hemorrhage, perforation, or obstruction   . Rheumatoid arthritis(714.0)   . Type II or unspecified type diabetes mellitus without mention of complication, not stated as uncontrolled   . Unspecified essential hypertension   . Ventricular tachycardia (HCC)    Rx via ICD 5 /2012    Current Outpatient Prescriptions  Medication Sig Dispense Refill  . ACCU-CHEK AVIVA PLUS test strip CHECK BLOOD SUGAR  TWICE A DAY  1  . amiodarone (PACERONE) 200 MG tablet Take 1 tablet (200 mg total) by mouth daily. 90 tablet 3  . apixaban (ELIQUIS) 5 MG TABS tablet Take 1 tablet (5 mg total) by mouth 2 (two) times daily. 180 tablet 3  . atorvastatin (LIPITOR) 10 MG tablet Take 10 mg by mouth daily.      . benzonatate (TESSALON) 100 MG capsule Take 1 capsule  (100 mg total) by mouth 3 (three) times daily as needed for cough. 20 capsule 0  . bisacodyl (DULCOLAX) 5 MG EC tablet Take 1 tablet (5 mg total) by mouth daily as needed for moderate constipation. 30 tablet 0  . carvedilol (COREG) 6.25 MG tablet Take 1 tablet (6.25 mg total) by mouth 2 (two) times daily with a meal. 180 tablet 3  . digoxin (LANOXIN) 0.125 MG tablet Take 1 tablet (0.125 mg total) by mouth daily. 90 tablet 3  . docusate sodium (COLACE) 100 MG capsule Take 1 capsule (100 mg total) by mouth daily. 10 capsule 0  . esomeprazole (NEXIUM) 40 MG capsule Take 40 mg by mouth daily.      Marland Kitchen guaiFENesin-dextromethorphan (ROBITUSSIN DM) 100-10 MG/5ML syrup Take 5 mLs by mouth every 4 (four) hours as needed for cough. 118 mL 0  . hydrocortisone cream 1 % Apply topically as needed for itching. 30 g 0  . insulin aspart (NOVOLOG) 100 UNIT/ML injection Inject 0-15 Units into the skin 3 (three) times daily with meals. 10 mL 11  . insulin glargine (LANTUS) 100 UNIT/ML injection Inject 10-40 Units into the skin at bedtime as needed. Sliding scale.  If cbg= 110: none, cbg=200 take 25-30 units, cbg over 250 take 40 units    . ipratropium (ATROVENT) 0.02 % nebulizer solution Take 0.5 mg by nebulization 4 (four) times daily.    Marland Kitchen LORazepam (ATIVAN) 0.5 MG tablet Take 1 tablet (0.5 mg total) by mouth every 6 (six) hours as needed for anxiety or sleep. 30 tablet 0  . losartan (COZAAR) 25 MG tablet Take 0.5 tablets (12.5 mg total) by mouth daily. 45 tablet 3  . menthol-cetylpyridinium (CEPACOL) 3 MG lozenge Take 1 lozenge (3 mg total) by mouth as needed for sore throat. 100 tablet 12  . polyethylene glycol (MIRALAX / GLYCOLAX) packet Take 17 g by mouth daily. 14 each 0  . potassium chloride SA (K-DUR,KLOR-CON) 20 MEQ tablet Take 2 tablets (40 mEq total) by mouth daily. 30 tablet 6  . spironolactone (ALDACTONE) 25 MG tablet Take 25 mg by mouth daily.    Marland Kitchen torsemide (DEMADEX) 20 MG tablet Take 3 tablets (60 mg  total) by mouth 2 (two) times daily. 540 tablet 3   No current facility-administered medications for this encounter.     No Known Allergies    Social History   Social History  . Marital status: Married    Spouse name: N/A  . Number of children: N/A  . Years of education: N/A   Occupational History  . Not on file.   Social History Main Topics  . Smoking status: Never Smoker  . Smokeless tobacco: Never Used  . Alcohol use No  . Drug use: No  . Sexual activity: Not on file   Other Topics Concern  . Not on file   Social History Narrative   Married. Has 1 surviving child and 4 surviving grandchildren.       Family History  Problem Relation Age of Onset  . Coronary artery disease Sister   .  Coronary artery disease Brother     Vitals:   12/31/16 1005  BP: (!) 83/42  Pulse: 72  SpO2: 100%  Weight: 195 lb 6 oz (88.6 kg)   Recheck BP 86/50  Wt Readings from Last 3 Encounters:  12/31/16 195 lb 6 oz (88.6 kg)  12/03/16 197 lb 8 oz (89.6 kg)  11/19/16 206 lb 8 oz (93.7 kg)    PHYSICAL EXAM:  General: Elderly appearing. No resp difficulty. In [facility] son present. HEENT: normal Neck: supple. JVP not elevated. Carotids 2+ bilat; no bruits. No thyromegaly or nodule noted. Cor: PMI nondisplaced. RRR, No M/G/R noted Lungs: CTAB, normal effort. Abdomen: soft, non-tender, distended, no HSM. No bruits or masses. +BS  Extremities: no cyanosis, clubbing, rash, R and LLE no edema.  Neuro: alert & orientedx3, cranial nerves grossly intact. moves all 4 extremities w/o difficulty. Affect pleasant   ASSESSMENT & PLAN:  1. Chronic systolic CHF:  Ischemic cardiomyopathy.  Echo 08/26/16 with LVEF 10-15%, severe biatrial enlargement, decreased RV systolic function.  EF down from 45-50% in 2013.  [employer] ICD.  Admitted in 1/18 with cardiogenic shock, recovered.  He is minimally active at baseline due to severe arthritis.  -  NYHA class III symptoms at least, but difficult to  assess fully to do immobility.  - Volume stable on exam.  - Continue torsemide 60 mg BID.  BMET and BNP today.  - Continue losartan 12.5 mg daily and spironolactone 25 mg daily.  - Continue digoxin 0.125 daily. Level stable at 11/19/2016    - Decrease coreg back to 3.25 mg BID with soft pressures.  - Reinforced fluid restriction to < 2 L daily, sodium restriction to less than 2000 mg daily, and the importance of daily weights.   2. CKD stage III:  - BMET today.     3. CAD: s/p CABG.  Coronary angiography 1/18 with patent graft and no targets for revascularization.  - Denies any chest pain. Continue ASA and statin.   No change.  4. Atrial fibrillation:   - NSR by exam today. No palpitations.   - Continue amiodarone 200 mg daily. Recent TSH was elevated but free T3 and free T4 normal.   - Needs regular eye exam.   - Continue Eliquis 5 mg BID.  Denies bleeding. No change.   5. Osteoarthritis:  - Knee OA significantly limits activity. Needs to see PCP.   6. Hypotension - Decrease coreg as above  Graciella Freer, PA-C 12/31/16   Greater than 50% of the 25 minute visit was spent in counseling/coordination of care regarding disease state education, sliding scale diuretics, signs of hypotension, and salt/fluid restriction.

## 2016-12-31 NOTE — Patient Instructions (Signed)
DECREASE Carvedilol (Coreg0 to 3.125 mg twice daily Can half your current 6.25 mg tablets (Take 1/2 tab twice daily). New Rx has been sent to [facility] for 3.125 mg tablets (Take 1 tab twice daily).  Routine lab work today. Will notify you of abnormal results, otherwise no news is good news!  Follow up 3 months with [name]. We will call you closer to this time, or you may call our office to schedule 1 month before you are due to be seen.  Do the following things EVERYDAY: 1) Weigh yourself in the morning before breakfast. Write it down and keep it in a log. 2) Take your medicines as prescribed 3) Eat low salt foods-Limit salt (sodium) to 2000 mg per day.  4) Stay as active as you can everyday 5) Limit all fluids for the day to less than 2 liters

## 2017-01-06 ENCOUNTER — Encounter: Payer: Self-pay | Admitting: Nurse Practitioner

## 2017-01-07 ENCOUNTER — Ambulatory Visit: Payer: Medicare Other | Admitting: Cardiology

## 2017-01-18 ENCOUNTER — Ambulatory Visit (INDEPENDENT_AMBULATORY_CARE_PROVIDER_SITE_OTHER): Payer: Medicare Other | Admitting: *Deleted

## 2017-01-18 DIAGNOSIS — I255 Ischemic cardiomyopathy: Secondary | ICD-10-CM | POA: Diagnosis not present

## 2017-01-18 NOTE — Progress Notes (Signed)
Remote ICD transmission.   

## 2017-01-25 ENCOUNTER — Encounter: Payer: Self-pay | Admitting: Cardiology

## 2017-01-27 LAB — CUP PACEART REMOTE DEVICE CHECK
Battery Remaining Longevity: 132 mo
Battery Remaining Percentage: 100 %
Brady Statistic RV Percent Paced: 0 %
HIGH POWER IMPEDANCE MEASURED VALUE: 49 Ohm
Implantable Lead Location: 753860
Implantable Lead Model: 158
Lead Channel Pacing Threshold Amplitude: 0.9 V
Lead Channel Setting Pacing Pulse Width: 0.4 ms
Lead Channel Setting Sensing Sensitivity: 0.5 mV
MDC IDC LEAD IMPLANT DT: 20080415
MDC IDC LEAD SERIAL: 176041
MDC IDC MSMT LEADCHNL RV IMPEDANCE VALUE: 456 Ohm
MDC IDC MSMT LEADCHNL RV PACING THRESHOLD PULSEWIDTH: 0.4 ms
MDC IDC PG IMPLANT DT: 20150612
MDC IDC PG SERIAL: 126688
MDC IDC SESS DTM: 20180605053200
MDC IDC SET LEADCHNL RV PACING AMPLITUDE: 2.4 V

## 2017-02-25 ENCOUNTER — Ambulatory Visit: Payer: Medicare Other | Admitting: Nurse Practitioner

## 2017-03-11 ENCOUNTER — Ambulatory Visit: Payer: Medicare Other | Admitting: Physician Assistant

## 2017-03-22 ENCOUNTER — Ambulatory Visit: Payer: Medicare Other | Admitting: Physician Assistant

## 2017-03-25 ENCOUNTER — Ambulatory Visit (INDEPENDENT_AMBULATORY_CARE_PROVIDER_SITE_OTHER): Payer: Medicare Other | Admitting: Physician Assistant

## 2017-03-25 ENCOUNTER — Encounter: Payer: Self-pay | Admitting: Physician Assistant

## 2017-03-25 VITALS — BP 110/62 | HR 64 | Ht 70.0 in | Wt 197.0 lb

## 2017-03-25 DIAGNOSIS — I480 Paroxysmal atrial fibrillation: Secondary | ICD-10-CM

## 2017-03-25 DIAGNOSIS — I2510 Atherosclerotic heart disease of native coronary artery without angina pectoris: Secondary | ICD-10-CM

## 2017-03-25 DIAGNOSIS — I5022 Chronic systolic (congestive) heart failure: Secondary | ICD-10-CM

## 2017-03-25 DIAGNOSIS — Z95810 Presence of automatic (implantable) cardiac defibrillator: Secondary | ICD-10-CM

## 2017-03-25 NOTE — Progress Notes (Signed)
Cardiology Office Note Date:  03/25/2017  Patient ID:  [name], [name] 25, 1936, MRN [id number] PCP:  [name], MD  Cardiologist:  Dr. [name] CHF: [name] Electrophysiologist: Dr. Graciela Husbands   Chief Complaint: annual device visit  History of Present Illness: [name] is a 82 y.o. male with history of CAD (CABG in 2000), ICM w/ICD, HTN, DM, CRI (III), HLD, chronic CHF (systolic), PAFib, comes in today to be seen for Dr. Graciela Husbands, last seen by him in July 2017 at that time was doing well, no device reprogramming or therapy changes.  She follows regularly with [name] and Dr. Iline Oven team.   The patient comes today accompanied by his son.  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  He tells me he feels well, denies any kind of CP, no rest SOB, has trouble falling asleep but once he does he sleeps well, denies symptoms of orthopnea or PND.  He does not feel like he is any more/less swollen then baseline, his son thinks he Middleton be and has taken away his pickles.  He denies any bleeding or signs of bleeding.  Mentions prior to his PMD decreasing his Eliquis dose he would get a nose bleed on/off but hasn't since.  His weight today is a stated weight that he did at home 2 days ago and reports that his weight as tome (weighing 1-2 x week, being hard for him to stand on the scale) is generally 195-197lbs   Device information: BSCCi single chamber ICD, implanted 2008, gen change 2015 + hx of VT treated with shock 2012 in [place] while working in his house  Past Medical History:  Diagnosis Date  . Congestive heart failure, unspecified   . Coronary atherosclerosis of unspecified type of vessel, native or graft   . ICD (implantable cardiac defibrillator), single BSX    DOI 2008  . Ischemic cardiomyopathy   . Other and unspecified hyperlipidemia   . Peptic ulcer, unspecified site, unspecified as acute or chronic, without mention of  hemorrhage, perforation, or obstruction   . Rheumatoid arthritis(714.0)   . Type II or unspecified type diabetes mellitus without mention of complication, not stated as uncontrolled   . Unspecified essential hypertension   . Ventricular tachycardia (HCC)    Rx via ICD 5 /2012    Past Surgical History:  Procedure Laterality Date  . CARDIAC CATHETERIZATION N/A 09/01/2016   Procedure: Right/Left Heart Cath and Coronary/Graft Angiography;  Surgeon: Laurey Morale, MD;  Location: [facility] INVASIVE CV LAB;  Service: Cardiovascular;  Laterality: N/A;  . CARDIAC DEFIBRILLATOR PLACEMENT     status post implantable  . coronary artey bypass graft     status post  . decomppression (other)     left median nerve  . IMPLANTABLE CARDIOVERTER DEFIBRILLATOR (ICD) GENERATOR CHANGE N/A 01/25/2014   Procedure: ICD GENERATOR CHANGE;  Surgeon: Duke Salvia, MD;  Location: [facility] CATH LAB;  Service: Cardiovascular;  Laterality: N/A;    Current Outpatient Prescriptions  Medication Sig Dispense Refill  . ACCU-CHEK AVIVA PLUS test strip CHECK BLOOD SUGAR TWICE A DAY  1  . amiodarone (PACERONE) 200 MG tablet Take 1 tablet (200 mg total) by mouth daily. 90 tablet 3  . apixaban (ELIQUIS) 5 MG TABS tablet Take 0.5 mg by mouth 2 (two) times daily.    Marland Kitchen atorvastatin (LIPITOR) 10 MG tablet Take 10 mg by mouth daily.      . carvedilol (COREG)  3.125 MG tablet Take 1 tablet (3.125 mg total) by mouth 2 (two) times daily with a meal. 90 tablet 3  . digoxin (LANOXIN) 0.125 MG tablet Take 0.5 mg by mouth daily.    Marland Kitchen docusate sodium (COLACE) 100 MG capsule Take 100 mg by mouth as needed for mild constipation.    Marland Kitchen esomeprazole (NEXIUM) 40 MG capsule Take 40 mg by mouth daily.      Marland Kitchen glyBURIDE-metformin (GLUCOVANCE) 5-500 MG tablet Take 1 tablet by mouth daily.    . insulin glargine (LANTUS) 100 UNIT/ML injection Inject 10-40 Units into the skin at bedtime as needed. Sliding scale.  If cbg= 110: none, cbg=200 take 25-30 units, cbg  over 250 take 40 units    . losartan (COZAAR) 25 MG tablet Take 0.5 tablets (12.5 mg total) by mouth daily. 45 tablet 3  . polyethylene glycol (MIRALAX / GLYCOLAX) packet Take 17 g by mouth as needed.    . potassium chloride SA (K-DUR,KLOR-CON) 20 MEQ tablet Take 2 tablets (40 mEq total) by mouth daily. 30 tablet 6  . spironolactone (ALDACTONE) 25 MG tablet Take 25 mg by mouth daily.    Marland Kitchen torsemide (DEMADEX) 20 MG tablet Take 3 tablets (60 mg total) by mouth 2 (two) times daily. 540 tablet 3   No current facility-administered medications for this visit.     Allergies:   Patient has no known allergies.   Social History:  The patient  reports that he has never smoked. He has never used smokeless tobacco. He reports that he does not drink alcohol or use drugs.   Family History:  The patient's family history includes Coronary artery disease in his brother and sister.  ROS:  Please see the history of present illness.  All other systems are reviewed and otherwise negative.   PHYSICAL EXAM:  VS:  BP 110/62   Pulse 64   Ht 5\' 10"  (1.778 m)   Wt 197 lb (89.4 kg) Comment: Pt unable to weight. Pt says this is what he weighs  SpO2 99%   BMI 28.27 kg/m  BMI: Body mass index is 28.27 kg/m. Well nourished, well developed, in no acute distress  HEENT: normocephalic, atraumatic  Neck: no JVD, carotid bruits or masses Cardiac:  RRR; 1/6SM, no rubs, or gallops Lungs:  CTA b/l, no wheezing, rhonchi or rales  Abd: soft, nontender MS: no deformity or atrophy Ext: trace-1+ edema  Skin: warm and dry, no rash Neuro:  No gross deficits appreciated Psych: euthymic mood, full affect  ICD site is stable, no tethering or discomfort   EKG:  Done  11/12/16 is SR, 76bpm, IVCD, PR , QRS , QTc ICD interrogation done today by industry and reviewed by myself: battery and lead measurements are good, no VT   RHC/LHC (09/01/16) Coronary Findings  Dominance: Right Left Main: 50% ostial  stenosis LAD: Totally occluded proximal LAD after D1. Medium-sized D1 patent with 40-50% stenosis proximally. LIMA-distal LAD patent. Proximal to the LIMA touchdown there are serial 80% stenoses in the mid LAD. Left Cx: LCx occluded proximally. OMs occluded proximally. Sequential SVG-OM1 and OM2 patent with good flow in target vessels. RCA: RCA occluded at the ostium. SVG-PDA patent, backfills only to distal RCA.  Right Heart  RHC Procedural Findings: Hemodynamics (mmHg) RA mean 5 RV 40/8 PA 40/18 mean 27 PCWP mean 19 LV 88/24 AO 86/52 Oxygen saturations: PA 69% AO 98% Cardiac Output (Fick) 6.43  Cardiac Index (Fick) 3.12 Cardiac Output (Thermo) 3.86 Cardiac Index (Thermo)  1.9  Recent Labs: 08/29/2016: Magnesium 1.9 10/08/2016: TSH 11.550 11/12/2016: ALT 26 12/03/2016: Hemoglobin 10.6; Platelets 238 12/31/2016: B Natriuretic Peptide 408.2; BUN 36; Creatinine, Ser 2.09; Potassium 4.0; Sodium 132  08/25/2016: Cholesterol 71; HDL 33; LDL Cholesterol 32; Total CHOL/HDL Ratio 2.2; Triglycerides 28; VLDL 6   CrCl cannot be calculated (Patient's most recent lab result is older than the maximum 21 days allowed.).   Wt Readings from Last 3 Encounters:  03/25/17 197 lb (89.4 kg)  12/31/16 195 lb 6 oz (88.6 kg)  12/03/16 197 lb 8 oz (89.6 kg)     Other studies reviewed: Additional studies/records reviewed today include: summarized above  ASSESSMENT AND PLAN:  1. ICD     Stable/intact function  2. Chronic CHf (systolic), ICM     He has some edema, the patient denies symptoms,  He is pretty liberal with sodium, lengthy discussion today regarding this     C/w [name]     On BB, ARB, lanoxin, diuretic tx     April dig level 0.6  3. CAD     No anginal symptoms     C/w Dr. [name]     On BB, statin, no ASA with a/c     03/10/17 HDL 26, LDL 42, Trigs 148  4. Paroxysmal Afib     CHA2DS2Vasc is at least 4, on Eliquis (appropriately dosed at 2.5mg  given age and creat)      Amiodarone labs up to date, monitored by CHF team     Labs from PMD dayed 03/10/17 Creat 1.84, LFTs wnl, H/H 9.3/26.4     Pt denies overt or signs of bleeding, PMD note reports anemia w/u planned   Disposition: F/u with CHF team and Dr. [name] as scheduled  Current medicines are reviewed at length with the patient today.  The patient did not have any concerns regarding medicines.  Judith Blonder, PA-C 03/25/2017 5:33 PM     [facility] [address] [place] [address] [phone] (office)  [phone] (fax)

## 2017-03-25 NOTE — Patient Instructions (Addendum)
Medication Instructions:   Your physician recommends that you continue on your current medications as directed. Please refer to the Current Medication list given to you today.   If you need a refill on your cardiac medications before your next appointment, please call your pharmacy.  Labwork: NONE ORDERED  TODAY    Testing/Procedures:  NONE ORDERED  TODAY     Follow-Up: Your physician wants you to follow-up in: ONE YEAR WITH Graciela Husbands  You will receive a reminder letter in the mail two months in advance. If you don't receive a letter, please call our office to schedule the follow-up appointment.  Remote monitoring is used to monitor your Pacemaker of ICD from home. This monitoring reduces the number of office visits required to check your device to one time per year. It allows [place] to keep an eye on the functioning of your device to ensure it is working properly. You are scheduled for a device check from home on . AS SCHEDULED You may send your transmission at any time that day. If you have a wireless device, the transmission will be sent automatically. After your physician reviews your transmission, you will receive a postcard with your next transmission date. ]    Any Other Special Instructions Will Be Listed Below (If Applicable).

## 2017-04-01 ENCOUNTER — Emergency Department (HOSPITAL_COMMUNITY): Payer: Medicare Other

## 2017-04-01 ENCOUNTER — Encounter (HOSPITAL_COMMUNITY): Payer: Self-pay

## 2017-04-01 DIAGNOSIS — E871 Hypo-osmolality and hyponatremia: Secondary | ICD-10-CM | POA: Diagnosis not present

## 2017-04-01 DIAGNOSIS — I255 Ischemic cardiomyopathy: Secondary | ICD-10-CM | POA: Insufficient documentation

## 2017-04-01 DIAGNOSIS — Z794 Long term (current) use of insulin: Secondary | ICD-10-CM | POA: Insufficient documentation

## 2017-04-01 DIAGNOSIS — E1122 Type 2 diabetes mellitus with diabetic chronic kidney disease: Secondary | ICD-10-CM | POA: Diagnosis not present

## 2017-04-01 DIAGNOSIS — I480 Paroxysmal atrial fibrillation: Secondary | ICD-10-CM | POA: Insufficient documentation

## 2017-04-01 DIAGNOSIS — Z993 Dependence on wheelchair: Secondary | ICD-10-CM | POA: Diagnosis not present

## 2017-04-01 DIAGNOSIS — I340 Nonrheumatic mitral (valve) insufficiency: Secondary | ICD-10-CM | POA: Diagnosis not present

## 2017-04-01 DIAGNOSIS — Z951 Presence of aortocoronary bypass graft: Secondary | ICD-10-CM | POA: Insufficient documentation

## 2017-04-01 DIAGNOSIS — I5023 Acute on chronic systolic (congestive) heart failure: Secondary | ICD-10-CM | POA: Diagnosis not present

## 2017-04-01 DIAGNOSIS — Z79899 Other long term (current) drug therapy: Secondary | ICD-10-CM | POA: Insufficient documentation

## 2017-04-01 DIAGNOSIS — I130 Hypertensive heart and chronic kidney disease with heart failure and stage 1 through stage 4 chronic kidney disease, or unspecified chronic kidney disease: Principal | ICD-10-CM | POA: Insufficient documentation

## 2017-04-01 DIAGNOSIS — Z95810 Presence of automatic (implantable) cardiac defibrillator: Secondary | ICD-10-CM | POA: Diagnosis not present

## 2017-04-01 DIAGNOSIS — E785 Hyperlipidemia, unspecified: Secondary | ICD-10-CM | POA: Insufficient documentation

## 2017-04-01 DIAGNOSIS — D649 Anemia, unspecified: Secondary | ICD-10-CM | POA: Diagnosis not present

## 2017-04-01 DIAGNOSIS — Z7901 Long term (current) use of anticoagulants: Secondary | ICD-10-CM | POA: Diagnosis not present

## 2017-04-01 DIAGNOSIS — R0602 Shortness of breath: Secondary | ICD-10-CM | POA: Diagnosis present

## 2017-04-01 DIAGNOSIS — I2510 Atherosclerotic heart disease of native coronary artery without angina pectoris: Secondary | ICD-10-CM | POA: Diagnosis not present

## 2017-04-01 DIAGNOSIS — N184 Chronic kidney disease, stage 4 (severe): Secondary | ICD-10-CM | POA: Diagnosis not present

## 2017-04-01 LAB — BASIC METABOLIC PANEL
Anion gap: 12 (ref 5–15)
BUN: 38 mg/dL — AB (ref 6–20)
CALCIUM: 8.8 mg/dL — AB (ref 8.9–10.3)
CHLORIDE: 94 mmol/L — AB (ref 101–111)
CO2: 25 mmol/L (ref 22–32)
CREATININE: 2.12 mg/dL — AB (ref 0.61–1.24)
GFR calc non Af Amer: 28 mL/min — ABNORMAL LOW (ref >60)
GFR, EST AFRICAN AMERICAN: 32 mL/min — AB (ref >60)
Glucose, Bld: 67 mg/dL (ref 65–99)
POTASSIUM: 4.2 mmol/L (ref 3.5–5.1)
SODIUM: 131 mmol/L — AB (ref 135–145)

## 2017-04-01 LAB — CBC
HCT: 27.1 % — ABNORMAL LOW (ref 39.0–52.0)
HEMOGLOBIN: 9 g/dL — AB (ref 13.0–17.0)
MCH: 28.7 pg (ref 26.0–34.0)
MCHC: 33.2 g/dL (ref 30.0–36.0)
MCV: 86.3 fL (ref 78.0–100.0)
PLATELETS: 193 10*3/uL (ref 150–400)
RBC: 3.14 MIL/uL — AB (ref 4.22–5.81)
RDW: 14.6 % (ref 11.5–15.5)
WBC: 5.2 10*3/uL (ref 4.0–10.5)

## 2017-04-01 NOTE — ED Triage Notes (Signed)
Pt states that today he became SOB today, pt has a hx of HF and has been taking his fluid pill. Yesterday and today he took an extra. No distress noted during triage.

## 2017-04-02 ENCOUNTER — Inpatient Hospital Stay (HOSPITAL_BASED_OUTPATIENT_CLINIC_OR_DEPARTMENT_OTHER): Payer: Medicare Other

## 2017-04-02 ENCOUNTER — Encounter (HOSPITAL_COMMUNITY): Payer: Self-pay | Admitting: Family Medicine

## 2017-04-02 ENCOUNTER — Observation Stay (HOSPITAL_COMMUNITY)
Admission: EM | Admit: 2017-04-02 | Discharge: 2017-04-03 | Disposition: A | Discharge status: Home / Self Care | Payer: Medicare Other | Attending: Family Medicine | Admitting: Family Medicine

## 2017-04-02 ENCOUNTER — Inpatient Hospital Stay (HOSPITAL_COMMUNITY): Payer: Medicare Other

## 2017-04-02 DIAGNOSIS — E119 Type 2 diabetes mellitus without complications: Secondary | ICD-10-CM | POA: Diagnosis not present

## 2017-04-02 DIAGNOSIS — I10 Essential (primary) hypertension: Secondary | ICD-10-CM | POA: Diagnosis not present

## 2017-04-02 DIAGNOSIS — M069 Rheumatoid arthritis, unspecified: Secondary | ICD-10-CM | POA: Diagnosis present

## 2017-04-02 DIAGNOSIS — I480 Paroxysmal atrial fibrillation: Secondary | ICD-10-CM | POA: Diagnosis not present

## 2017-04-02 DIAGNOSIS — I340 Nonrheumatic mitral (valve) insufficiency: Secondary | ICD-10-CM

## 2017-04-02 DIAGNOSIS — I5023 Acute on chronic systolic (congestive) heart failure: Secondary | ICD-10-CM | POA: Diagnosis not present

## 2017-04-02 DIAGNOSIS — N183 Chronic kidney disease, stage 3 unspecified: Secondary | ICD-10-CM | POA: Diagnosis present

## 2017-04-02 DIAGNOSIS — IMO0001 Reserved for inherently not codable concepts without codable children: Secondary | ICD-10-CM

## 2017-04-02 DIAGNOSIS — D649 Anemia, unspecified: Secondary | ICD-10-CM | POA: Diagnosis present

## 2017-04-02 DIAGNOSIS — I5041 Acute combined systolic (congestive) and diastolic (congestive) heart failure: Secondary | ICD-10-CM

## 2017-04-02 DIAGNOSIS — E871 Hypo-osmolality and hyponatremia: Secondary | ICD-10-CM | POA: Diagnosis not present

## 2017-04-02 DIAGNOSIS — E1122 Type 2 diabetes mellitus with diabetic chronic kidney disease: Secondary | ICD-10-CM | POA: Diagnosis not present

## 2017-04-02 DIAGNOSIS — Z794 Long term (current) use of insulin: Secondary | ICD-10-CM

## 2017-04-02 DIAGNOSIS — I130 Hypertensive heart and chronic kidney disease with heart failure and stage 1 through stage 4 chronic kidney disease, or unspecified chronic kidney disease: Secondary | ICD-10-CM | POA: Diagnosis not present

## 2017-04-02 LAB — HEPATIC FUNCTION PANEL
ALBUMIN: 3.8 g/dL (ref 3.5–5.0)
ALT: 30 U/L (ref 17–63)
AST: 26 U/L (ref 15–41)
Alkaline Phosphatase: 98 U/L (ref 38–126)
Bilirubin, Direct: 0.2 mg/dL (ref 0.1–0.5)
Indirect Bilirubin: 0.7 mg/dL (ref 0.3–0.9)
TOTAL PROTEIN: 8 g/dL (ref 6.5–8.1)
Total Bilirubin: 0.9 mg/dL (ref 0.3–1.2)

## 2017-04-02 LAB — URINALYSIS, ROUTINE W REFLEX MICROSCOPIC
Bilirubin Urine: NEGATIVE
GLUCOSE, UA: NEGATIVE mg/dL
Hgb urine dipstick: NEGATIVE
KETONES UR: NEGATIVE mg/dL
LEUKOCYTES UA: NEGATIVE
Nitrite: NEGATIVE
PH: 7 (ref 5.0–8.0)
PROTEIN: NEGATIVE mg/dL
Specific Gravity, Urine: 1.005 (ref 1.005–1.030)

## 2017-04-02 LAB — TROPONIN I
TROPONIN I: 0.05 ng/mL — AB (ref <0.03)
Troponin I: 0.04 ng/mL (ref <0.03)
Troponin I: 0.05 ng/mL (ref <0.03)
Troponin I: 0.05 ng/mL (ref <0.03)

## 2017-04-02 LAB — ECHOCARDIOGRAM COMPLETE
HEIGHTINCHES: 70 in
WEIGHTICAEL: 3089.6 [oz_av]

## 2017-04-02 LAB — BRAIN NATRIURETIC PEPTIDE: B NATRIURETIC PEPTIDE 5: 739.7 pg/mL — AB (ref 0.0–100.0)

## 2017-04-02 LAB — GLUCOSE, CAPILLARY
GLUCOSE-CAPILLARY: 219 mg/dL — AB (ref 65–99)
Glucose-Capillary: 142 mg/dL — ABNORMAL HIGH (ref 65–99)
Glucose-Capillary: 138 mg/dL — ABNORMAL HIGH (ref 65–99)

## 2017-04-02 LAB — HEMOGLOBIN A1C
HEMOGLOBIN A1C: 7.1 % — AB (ref 4.8–5.6)
MEAN PLASMA GLUCOSE: 157.07 mg/dL

## 2017-04-02 LAB — DIGOXIN LEVEL: Digoxin Level: 0.9 ng/mL (ref 0.8–2.0)

## 2017-04-02 MED ORDER — POLYETHYLENE GLYCOL 3350 17 G PO PACK: 17.0000 g | PACK | Freq: Every day | ORAL | Status: DC | PRN

## 2017-04-02 MED ORDER — SPIRONOLACTONE 25 MG PO TABS
25.0000 mg | ORAL_TABLET | Freq: Every day | ORAL | Status: DC
  Administered 2017-04-02 – 2017-04-03 (×2): 25 mg via ORAL
  Filled 2017-04-02 (×2): qty 1

## 2017-04-02 MED ORDER — AMIODARONE HCL 200 MG PO TABS
200.0000 mg | ORAL_TABLET | Freq: Every day | ORAL | Status: DC
  Administered 2017-04-02 – 2017-04-03 (×2): 200 mg via ORAL
  Filled 2017-04-02 (×2): qty 1

## 2017-04-02 MED ORDER — FUROSEMIDE 10 MG/ML IJ SOLN: 40.0000 mg | Freq: Once | INTRAMUSCULAR | Status: DC

## 2017-04-02 MED ORDER — SODIUM CHLORIDE 0.9% FLUSH: 3.0000 mL | INTRAVENOUS | Status: DC | PRN

## 2017-04-02 MED ORDER — DOCUSATE SODIUM 100 MG PO CAPS: 100.0000 mg | ORAL_CAPSULE | Freq: Every day | ORAL | Status: DC | PRN

## 2017-04-02 MED ORDER — LOSARTAN POTASSIUM 25 MG PO TABS
12.5000 mg | ORAL_TABLET | Freq: Every day | ORAL | Status: DC
  Administered 2017-04-02 – 2017-04-03 (×2): 12.5 mg via ORAL
  Filled 2017-04-02 (×2): qty 1

## 2017-04-02 MED ORDER — ONDANSETRON HCL 4 MG/2ML IJ SOLN: 4.0000 mg | Freq: Four times a day (QID) | INTRAMUSCULAR | Status: DC | PRN

## 2017-04-02 MED ORDER — INSULIN ASPART 100 UNIT/ML ~~LOC~~ SOLN
0.0000 [IU] | Freq: Three times a day (TID) | SUBCUTANEOUS | Status: DC
  Administered 2017-04-02 (×2): 2 [IU] via SUBCUTANEOUS
  Administered 2017-04-03: 3 [IU] via SUBCUTANEOUS
  Administered 2017-04-03: 5 [IU] via SUBCUTANEOUS

## 2017-04-02 MED ORDER — SODIUM CHLORIDE 0.9% FLUSH
3.0000 mL | Freq: Two times a day (BID) | INTRAVENOUS | Status: DC
  Administered 2017-04-02 – 2017-04-03 (×3): 3 mL via INTRAVENOUS

## 2017-04-02 MED ORDER — SODIUM CHLORIDE 0.9 % IV SOLN: 250.0000 mL | INTRAVENOUS | Status: DC | PRN

## 2017-04-02 MED ORDER — POTASSIUM CHLORIDE CRYS ER 20 MEQ PO TBCR
40.0000 meq | EXTENDED_RELEASE_TABLET | Freq: Every day | ORAL | Status: DC
  Administered 2017-04-02 – 2017-04-03 (×2): 40 meq via ORAL
  Filled 2017-04-02 (×2): qty 2

## 2017-04-02 MED ORDER — PANTOPRAZOLE SODIUM 40 MG PO TBEC
40.0000 mg | DELAYED_RELEASE_TABLET | Freq: Every day | ORAL | Status: DC
  Administered 2017-04-02 – 2017-04-03 (×2): 40 mg via ORAL
  Filled 2017-04-02 (×2): qty 1

## 2017-04-02 MED ORDER — ACETAMINOPHEN 325 MG PO TABS: 650.0000 mg | ORAL_TABLET | ORAL | Status: DC | PRN

## 2017-04-02 MED ORDER — ALPRAZOLAM 0.25 MG PO TABS: 0.2500 mg | ORAL_TABLET | Freq: Two times a day (BID) | ORAL | Status: DC | PRN

## 2017-04-02 MED ORDER — ATORVASTATIN CALCIUM 10 MG PO TABS
10.0000 mg | ORAL_TABLET | Freq: Every day | ORAL | Status: DC
  Administered 2017-04-02 – 2017-04-03 (×2): 10 mg via ORAL
  Filled 2017-04-02 (×3): qty 1

## 2017-04-02 MED ORDER — PERFLUTREN LIPID MICROSPHERE
1.0000 mL | INTRAVENOUS | Status: AC | PRN
  Administered 2017-04-02: 1 mL via INTRAVENOUS
  Filled 2017-04-02: qty 10

## 2017-04-02 MED ORDER — CARVEDILOL 3.125 MG PO TABS
3.1250 mg | ORAL_TABLET | Freq: Two times a day (BID) | ORAL | Status: DC
  Administered 2017-04-03: 3.125 mg via ORAL
  Filled 2017-04-02 (×4): qty 1

## 2017-04-02 MED ORDER — APIXABAN 5 MG PO TABS
5.0000 mg | ORAL_TABLET | Freq: Two times a day (BID) | ORAL | Status: DC
  Administered 2017-04-02: 5 mg via ORAL
  Filled 2017-04-02 (×2): qty 1

## 2017-04-02 MED ORDER — APIXABAN 2.5 MG PO TABS
2.5000 mg | ORAL_TABLET | Freq: Two times a day (BID) | ORAL | Status: DC
  Administered 2017-04-02 – 2017-04-03 (×2): 2.5 mg via ORAL
  Filled 2017-04-02 (×2): qty 1

## 2017-04-02 MED ORDER — INSULIN ASPART 100 UNIT/ML ~~LOC~~ SOLN
0.0000 [IU] | Freq: Every day | SUBCUTANEOUS | Status: DC
  Administered 2017-04-02: 2 [IU] via SUBCUTANEOUS

## 2017-04-02 MED ORDER — FUROSEMIDE 10 MG/ML IJ SOLN
60.0000 mg | Freq: Once | INTRAMUSCULAR | Status: AC
  Administered 2017-04-02: 60 mg via INTRAVENOUS
  Filled 2017-04-02: qty 6

## 2017-04-02 MED ORDER — DIGOXIN 125 MCG PO TABS
0.0625 mg | ORAL_TABLET | Freq: Every day | ORAL | Status: DC
  Administered 2017-04-02 – 2017-04-03 (×2): 0.0625 mg via ORAL
  Filled 2017-04-02 (×2): qty 1

## 2017-04-02 MED ORDER — FUROSEMIDE 10 MG/ML IJ SOLN
60.0000 mg | Freq: Two times a day (BID) | INTRAMUSCULAR | Status: DC
  Administered 2017-04-02 – 2017-04-03 (×2): 60 mg via INTRAVENOUS
  Filled 2017-04-02 (×2): qty 6

## 2017-04-02 NOTE — H&P (Signed)
History and Physical    [name] JGO:[id number] DOB: 19-Jul-1935 DOA: 04/02/2017  PCP: [name], MD   Patient coming from: Home  Chief Complaint: SOB, increased leg swelling  HPI: [name] is a 82 y.o. male with medical history significant for coronary artery disease, paroxysmal atrial fibrillation on Eliquis, chronic kidney disease stage III, insulin-dependent diabetes mellitus, and chronic systolic CHF with EF 10-15%, now presenting to the emergency department with shortness of breath and increased leg swelling. Patient reports that he had been in his usual state until yesterday when he noted shortness of breath, worse with exertion. He reports sleeping in a recliner the night before due to orthopnea. He also noted some increased lower extremity edema, and also reports feeling some swelling around his lower abdomen. He denies any significant cough, but notes that the dyspnea is worsening and now present with any minimal exertion. No recent fevers or chills. He denies melena or hematochezia. He reports taking an extra dose of his diuretic yesterday with no appreciable effect.   ED Course: Upon arrival to the ED, patient is found to be afebrile, saturating well on room air, and with vitals otherwise stable. EKG features a sinus rhythm with first grieving no block and nonspecific interventricular conduction delay. Chest x-ray demonstrates moderate pulmonary edema with possible small pleural effusions consistent with acute CHF. Chemistry panel reveals a sodium of 131, BUN 38, and creatinine of 2.12, similar to priors. CBC is notable for a normocytic anemia with hemoglobin of 9.0, down from 10.6 in April 2018. Troponin is slightly elevated to 0.04 and BNP is elevated to 740, higher than priors. Urinalysis is unremarkable. Patient was treated with 40 mg IV Lasix in the ED, remained hemodynamically stable and in no apparent respiratory distress while at rest, and he will be admitted to the  telemetry unit for ongoing evaluation and management of acute on chronic systolic CHF.  Review of Systems:  All other systems reviewed and apart from HPI, are negative.  Past Medical History:  Diagnosis Date  . Congestive heart failure, unspecified   . Coronary atherosclerosis of unspecified type of vessel, native or graft   . ICD (implantable cardiac defibrillator), single BSX    DOI 2008  . Ischemic cardiomyopathy   . Other and unspecified hyperlipidemia   . Peptic ulcer, unspecified site, unspecified as acute or chronic, without mention of hemorrhage, perforation, or obstruction   . Rheumatoid arthritis(714.0)   . Type II or unspecified type diabetes mellitus without mention of complication, not stated as uncontrolled   . Unspecified essential hypertension   . Ventricular tachycardia (HCC)    Rx via ICD 5 /2012    Past Surgical History:  Procedure Laterality Date  . CARDIAC CATHETERIZATION N/A 09/01/2016   Procedure: Right/Left Heart Cath and Coronary/Graft Angiography;  Surgeon: Laurey Morale, MD;  Location: [facility] INVASIVE CV LAB;  Service: Cardiovascular;  Laterality: N/A;  . CARDIAC DEFIBRILLATOR PLACEMENT     status post implantable  . coronary artey bypass graft     status post  . decomppression (other)     left median nerve  . IMPLANTABLE CARDIOVERTER DEFIBRILLATOR (ICD) GENERATOR CHANGE N/A 01/25/2014   Procedure: ICD GENERATOR CHANGE;  Surgeon: Duke Salvia, MD;  Location: [facility] CATH LAB;  Service: Cardiovascular;  Laterality: N/A;     reports that he has never smoked. He has never used smokeless tobacco. He reports that he does not drink alcohol or use drugs.  No Known Allergies  Family History  Problem Relation Age of Onset  . Coronary artery disease Sister   . Coronary artery disease Brother      Prior to Admission medications   Medication Sig Start Date End Date Taking? Authorizing Provider  amiodarone (PACERONE) 200 MG tablet Take 1 tablet (200 mg total) by  mouth daily. 09/27/16  Yes Bensimhon, Bevelyn Buckles, MD  apixaban (ELIQUIS) 5 MG TABS tablet Take 0.5 mg by mouth 2 (two) times daily.   Yes [provider]  atorvastatin (LIPITOR) 10 MG tablet Take 10 mg by mouth daily.     Yes [provider]  carvedilol (COREG) 3.125 MG tablet Take 1 tablet (3.125 mg total) by mouth 2 (two) times daily with a meal. 12/31/16 12/26/17 Yes Tillery, Mariam Dollar, PA-C  digoxin (LANOXIN) 0.125 MG tablet Take 0.5 mg by mouth daily.   Yes [provider]  docusate sodium (COLACE) 100 MG capsule Take 100 mg by mouth as needed for mild constipation.   Yes [provider]  esomeprazole (NEXIUM) 40 MG capsule Take 40 mg by mouth daily.     Yes [provider]  glyBURIDE-metformin (GLUCOVANCE) 5-500 MG tablet Take 1 tablet by mouth daily. 03/03/17  Yes [provider]  insulin glargine (LANTUS) 100 UNIT/ML injection Inject 10-40 Units into the skin 2 (two) times daily as needed (high blood sugar). Sliding scale.  If cbg= 110: none, cbg=200 take 25-30 units, cbg over 250 take 40 units   Yes [provider]  losartan (COZAAR) 25 MG tablet Take 0.5 tablets (12.5 mg total) by mouth daily. 11/19/16  Yes Graciella Freer, PA-C  polyethylene glycol [facility] / GLYCOLAX) packet Take 17 g by mouth daily as needed for mild constipation.    Yes [provider]  potassium chloride SA (K-DUR,KLOR-CON) 20 MEQ tablet Take 2 tablets (40 mEq total) by mouth daily. 09/03/16  Yes Leone Brand, NP  spironolactone (ALDACTONE) 25 MG tablet Take 25 mg by mouth daily.   Yes [provider]  torsemide (DEMADEX) 20 MG tablet Take 3 tablets (60 mg total) by mouth 2 (two) times daily. Patient taking differently: Take 80 mg by mouth 2 (two) times daily.  11/19/16  Yes Graciella Freer, PA-C    Physical Exam: Vitals:   04/02/17 0315 04/02/17 0330 04/02/17 0345 04/02/17 0400  BP: 99/72 109/65 107/63 105/62  Pulse: 75  70 73 65  Resp: 18 13 16 15   Temp:      SpO2: 99% 100% 97% 98%  Weight:      Height:          Constitutional: NAD, calm, appears uncomfortable Eyes: PERTLA, lids and conjunctivae normal ENMT: Mucous membranes are moist. Posterior pharynx clear of any exudate or lesions.   Neck: normal, supple, no masses, no thyromegaly Respiratory: Dyspneic with speech, crackles bilaterally, no wheezing. No accessory muscle use.  Cardiovascular: S1 & S2 heard, regular rate and rhythm, no significant murmur. 2+ pretibial edema bilaterally.  Abdomen: No distension, no tenderness, no masses palpated. Bowel sounds active.  Musculoskeletal: no clubbing / cyanosis. No joint deformity upper and lower extremities.    Skin: no significant rashes, lesions, ulcers. Warm, dry, well-perfused. Neurologic: CN 2-12 grossly intact. Sensation intact, DTR normal. Strength 5/5 in all 4 limbs.  Psychiatric: Alert and oriented x 3. Pleasant and cooperative.     Labs on Admission: I have personally reviewed following labs and imaging studies  CBC:  Recent Labs Lab 04/01/17 2017  WBC 5.2  HGB 9.0*  HCT 27.1*  MCV 86.3  PLT 193   Basic Metabolic Panel:  Recent Labs Lab 04/01/17 2017  NA 131*  K 4.2  CL 94*  CO2 25  GLUCOSE 67  BUN 38*  CREATININE 2.12*  CALCIUM 8.8*   GFR: Estimated Creatinine Clearance: 31.3 mL/min (A) (by C-G formula based on SCr of 2.12 mg/dL (H)). Liver Function Tests:  Recent Labs Lab 04/02/17 0313  AST 26  ALT 30  ALKPHOS 98  BILITOT 0.9  PROT 8.0  ALBUMIN 3.8   No results for input(s): LIPASE, AMYLASE in the last 168 hours. No results for input(s): AMMONIA in the last 168 hours. Coagulation Profile: No results for input(s): INR, PROTIME in the last 168 hours. Cardiac Enzymes:  Recent Labs Lab 04/02/17 0313  TROPONINI 0.04*   BNP (last 3 results) No results for input(s): PROBNP in the last 8760 hours. HbA1C: No results for input(s): HGBA1C in the last 72  hours. CBG: No results for input(s): GLUCAP in the last 168 hours. Lipid Profile: No results for input(s): CHOL, HDL, LDLCALC, TRIG, CHOLHDL, LDLDIRECT in the last 72 hours. Thyroid Function Tests: No results for input(s): TSH, T4TOTAL, FREET4, T3FREE, THYROIDAB in the last 72 hours. Anemia Panel: No results for input(s): VITAMINB12, FOLATE, FERRITIN, TIBC, IRON, RETICCTPCT in the last 72 hours. Urine analysis:    Component Value Date/Time   COLORURINE STRAW (A) 04/02/2017 0340   APPEARANCEUR CLEAR 04/02/2017 0340   LABSPEC 1.005 04/02/2017 0340   PHURINE 7.0 04/02/2017 0340   GLUCOSEU NEGATIVE 04/02/2017 0340   HGBUR NEGATIVE 04/02/2017 0340   BILIRUBINUR NEGATIVE 04/02/2017 0340   KETONESUR NEGATIVE 04/02/2017 0340   PROTEINUR NEGATIVE 04/02/2017 0340   NITRITE NEGATIVE 04/02/2017 0340   LEUKOCYTESUR NEGATIVE 04/02/2017 0340   Sepsis Labs: @LABRCNTIP (procalcitonin:4,lacticidven:4) )No results found for this or any previous visit (from the past 240 hour(s)).   Radiological Exams on Admission: Dg Chest 2 View  Result Date: 04/01/2017 CLINICAL DATA:  Shortness of breath.  Fluid retention. EXAM: CHEST  2 VIEW COMPARISON:  Radiograph 08/24/2016 FINDINGS: Left-sided pacemaker remains in place. Patient is post median sternotomy and CABG. Stable cardiomegaly. Atherosclerosis of the aortic arch. There is moderate central pulmonary edema. Possible trace pleural effusions. No confluent airspace disease. No pneumothorax. No acute osseous abnormality per IMPRESSION: Moderate pulmonary edema and possible small pleural effusions consistent with CHF/fluid overload. Cardiomegaly is stable. Electronically Signed   By: Rubye Oaks M.D.   On: 04/01/2017 20:36    EKG: Independently reviewed. Sinus rhythm with 1st degree AV block and non-specific IVCD.   Assessment/Plan  1. Acute on chronic systolic CHF  - Pt presents with 1-2 days of progressive SOB, peripheral edema, orthopnea  - Noted to  have pulmonary edema on CXR and labored breathing with minimal exertion  - Wt appears to be stable  - Echo from January 2018 with EF 10-15%, mild LVH, severe diffuse HK, severe LAE, and mild TR  - He is managed at home with Torsemide 80 mg BID, losartan, digoxin  - He was treated with 40 mg IV Lasix in ED, plan to add another 20 mg IV now  - Plan to SLIV, fluid-restrict diet, follow daily wts and I/O's, continue diuresis with Lasix 60 mg IV q12h with adjustments per response  - Continue digoxin, losartan, and cardiac monitoring; follow daily chem panels, update echocardiogram   2. Paroxysmal atrial fibrillation  - In a sinus rhythm on admission  - CHADS-VASc is 72 (age x2,  CHF, DM, CAD)  - Continue Eliquis, digoxin, amiodarone    3. CKD stage III-IV - SCr is 2.12 on admission - Stable relative to priors  - Follow daily chem panel during diuresis    4. Insulin-dependent DM  - No A1c on file  - Managed at home with glyburide, metformin, and Lantus 10-40 units BID per sliding-scale  - Serum glucose is only 67 on arrival  - Plan to check CBG with meals and qHS, start a moderate-intensity SSI with Novolog, and adjust prn   5. Hyponatremia  - Serum sodium is 131 on admission, similar to priors  - Likely secondary to CHF  - Follow daily chem panel during diuresis    6. Anemia  - Hgb is 9.0 on admission, down from 10.6 in April 2018  - Pt denies melena or hematochezia  - Possibly dilutional in setting of hypervolemia  - Plan to check anemia panel, FOBT    DVT prophylaxis: Eliquis  Code Status: Full  Family Communication: Discussed with patient Disposition Plan: Admit to telemetry Consults called: None Admission status: Inpatient    Briscoe Deutscher, MD [facility] Pager [phone]  If 7PM-7AM, please contact night-coverage [email or web address] Password [facility]  04/02/2017, 4:51 AM

## 2017-04-02 NOTE — ED Notes (Signed)
Admitting Opyd, MD paged re: trop [phone]

## 2017-04-02 NOTE — Progress Notes (Signed)
*  PRELIMINARY RESULTS* Echocardiogram 2D Echocardiogram has been performed.  Jeryl Columbia 04/02/2017, 4:10 PM

## 2017-04-02 NOTE — ED Provider Notes (Signed)
[facility]-EMERGENCY DEPT Provider Note   CSN: [id number] Arrival date & time: 04/01/17  1959   By signing my name below, I, Clarisse Gouge, attest that this documentation has been prepared under the direction and in the presence of Loren Racer, MD. Electronically signed, Clarisse Gouge, ED Scribe. 04/02/17. 2:53 AM.   History   Chief Complaint Chief Complaint  Patient presents with  . Shortness of Breath   The history is provided by the patient, a relative and medical records. No language interpreter was used.    [name] is a 82 y.o. male with h/o T2DM, CHF, peptic ulcer, coronary atherosclerosis, rheumatoid arthritis and HTN presenting to the Emergency Department concerning 24-36 hours of SOB. Some leg swelling, abdominal distension associated. Pt taking a fluid pill for h/o heart failure; his son states he took an extra pill the the night before yesterday. He states he is wheelchair bound and he does not wear compression stockings. Pt followed by Dr. Lucila Maine of [place] for primary care. Son states pt also receives regular care at the CHF clinic in [facility]. No fatigue, pain, chest pain, SOB, appetite change, dark/ bloody stool or any other complaints noted at this time.   Past Medical History:  Diagnosis Date  . Congestive heart failure, unspecified   . Coronary atherosclerosis of unspecified type of vessel, native or graft   . ICD (implantable cardiac defibrillator), single BSX    DOI 2008  . Ischemic cardiomyopathy   . Other and unspecified hyperlipidemia   . Peptic ulcer, unspecified site, unspecified as acute or chronic, without mention of hemorrhage, perforation, or obstruction   . Rheumatoid arthritis(714.0)   . Type II or unspecified type diabetes mellitus without mention of complication, not stated as uncontrolled   . Unspecified essential hypertension   . Ventricular tachycardia (HCC)    Rx via ICD 5 /2012    Patient Active Problem List   Diagnosis Date  Noted  . Hyponatremia 04/02/2017  . Normocytic anemia 04/02/2017  . OA (osteoarthritis) 11/19/2016  . Paroxysmal atrial fibrillation (HCC) 09/10/2016  . CKD (chronic kidney disease), stage III 09/10/2016  . Physical deconditioning 09/10/2016  . Single implantable cardioverter-defibrillator BSX    . Ventricular tachycardia-treated the ATP 01/29/2011  . Hx of CABG 04/17/2009  . Cardiomyopathy, ischemic 04/17/2009  . Acute on chronic systolic CHF (congestive heart failure) (HCC) 04/17/2009  . Insulin dependent diabetes mellitus (HCC) 04/12/2009  . Hyperlipidemia LDL goal <70 04/12/2009  . Essential hypertension 04/12/2009  . PUD 04/12/2009  . Rheumatoid arthritis (HCC) 04/12/2009    Past Surgical History:  Procedure Laterality Date  . CARDIAC CATHETERIZATION N/A 09/01/2016   Procedure: Right/Left Heart Cath and Coronary/Graft Angiography;  Surgeon: Laurey Morale, MD;  Location: [facility] INVASIVE CV LAB;  Service: Cardiovascular;  Laterality: N/A;  . CARDIAC DEFIBRILLATOR PLACEMENT     status post implantable  . coronary artey bypass graft     status post  . decomppression (other)     left median nerve  . IMPLANTABLE CARDIOVERTER DEFIBRILLATOR (ICD) GENERATOR CHANGE N/A 01/25/2014   Procedure: ICD GENERATOR CHANGE;  Surgeon: Duke Salvia, MD;  Location: [facility] CATH LAB;  Service: Cardiovascular;  Laterality: N/A;       Home Medications    Prior to Admission medications   Medication Sig Start Date End Date Taking? Authorizing Provider  amiodarone (PACERONE) 200 MG tablet Take 1 tablet (200 mg total) by mouth daily. 09/27/16  Yes Bensimhon, Bevelyn Buckles, MD  atorvastatin (LIPITOR) 10  MG tablet Take 10 mg by mouth daily.     Yes [provider]  carvedilol (COREG) 3.125 MG tablet Take 1 tablet (3.125 mg total) by mouth 2 (two) times daily with a meal. 12/31/16 12/26/17 Yes Tillery, Mariam Dollar, PA-C  digoxin (LANOXIN) 0.125 MG tablet Take 0.0625 mg by mouth daily.    Yes [provider]  docusate sodium (COLACE) 100 MG capsule Take 100 mg by mouth as needed for mild constipation.   Yes [provider]  esomeprazole (NEXIUM) 40 MG capsule Take 40 mg by mouth daily.     Yes [provider]  insulin glargine (LANTUS) 100 UNIT/ML injection Inject 10-40 Units into the skin 2 (two) times daily as needed (high blood sugar). Sliding scale.  If cbg= 110: none, cbg=200 take 25-30 units, cbg over 250 take 40 units   Yes [provider]  losartan (COZAAR) 25 MG tablet Take 0.5 tablets (12.5 mg total) by mouth daily. 11/19/16  Yes Graciella Freer, PA-C  polyethylene glycol [facility] / GLYCOLAX) packet Take 17 g by mouth daily as needed for mild constipation.    Yes [provider]  potassium chloride SA (K-DUR,KLOR-CON) 20 MEQ tablet Take 2 tablets (40 mEq total) by mouth daily. 09/03/16  Yes Leone Brand, NP  spironolactone (ALDACTONE) 25 MG tablet Take 25 mg by mouth daily.   Yes [provider]  apixaban (ELIQUIS) 2.5 MG TABS tablet Take 1 tablet (2.5 mg total) by mouth 2 (two) times daily. 04/03/17 07/02/17  Cleora Fleet, MD  torsemide (DEMADEX) 20 MG tablet Take 100 mg in morning and 80 mg in the evening 04/03/17   Cleora Fleet, MD    Family History Family History  Problem Relation Age of Onset  . Coronary artery disease Sister   . Coronary artery disease Brother     Social History Social History  Substance Use Topics  . Smoking status: Never Smoker  . Smokeless tobacco: Never Used  . Alcohol use No     Allergies   Patient has no known allergies.   Review of Systems Review of Systems  Constitutional: Negative for chills and fever.  HENT: Negative for facial swelling.   Eyes: Negative for visual disturbance.  Respiratory: Positive for shortness of breath. Negative for cough and chest tightness.   Cardiovascular: Positive for leg swelling. Negative for chest pain and palpitations.    Gastrointestinal: Positive for abdominal distention. Negative for abdominal pain, diarrhea, nausea and vomiting.  Musculoskeletal: Negative for back pain, myalgias, neck pain and neck stiffness.  Skin: Negative for rash and wound.  Neurological: Negative for dizziness, weakness, light-headedness, numbness and headaches.  All other systems reviewed and are negative.    Physical Exam Updated Vital Signs BP 93/60   Pulse 70   Temp 97.9 F (36.6 C)   Resp 20   Ht 5\' 10"  (1.778 m)   Wt 197 lb (89.4 kg)   SpO2 99%   BMI 28.27 kg/m   Physical Exam  Constitutional: He is oriented to person, place, and time. He appears well-developed and well-nourished. No distress.  HENT:  Head: Normocephalic and atraumatic.  Mouth/Throat: Oropharynx is clear and moist. No oropharyngeal exudate.  Eyes: Pupils are equal, round, and reactive to light. EOM are normal.  Neck: Normal range of motion. Neck supple. No JVD present.  Cardiovascular: Normal rate and regular rhythm.  Exam reveals no gallop and no friction rub.   No murmur heard. Pulmonary/Chest: Effort normal. He  has rales.  Crackles in bilateral bases  Abdominal: Soft. Bowel sounds are normal. There is no tenderness. There is no rebound and no guarding.  Musculoskeletal: Normal range of motion. He exhibits no edema or tenderness.  3+ pitting lower extremity edema bilaterally.  Neurological: He is alert and oriented to person, place, and time.  5/5 motor in all extremities. Sensation fully intact.  Skin: Skin is warm and dry. Capillary refill takes less than 2 seconds. No rash noted. No erythema.  Psychiatric: He has a normal mood and affect. His behavior is normal.  Nursing note and vitals reviewed.    ED Treatments / Results  DIAGNOSTIC STUDIES: Oxygen Saturation is 99% on RA, NL by my interpretation.    COORDINATION OF CARE: 2:49 AM-Discussed next steps with pt. Pt verbalized understanding and is agreeable with the plan. Will  perform hemoccult test and prepare pt for admission.   Labs (all labs ordered are listed, but only abnormal results are displayed) Labs Reviewed  CBC - Abnormal; Notable for the following:       Result Value   RBC 3.14 (*)    Hemoglobin 9.0 (*)    HCT 27.1 (*)    All other components within normal limits  BASIC METABOLIC PANEL - Abnormal; Notable for the following:    Sodium 131 (*)    Chloride 94 (*)    BUN 38 (*)    Creatinine, Ser 2.12 (*)    Calcium 8.8 (*)    GFR calc non Af Amer 28 (*)    GFR calc Af Amer 32 (*)    All other components within normal limits  BRAIN NATRIURETIC PEPTIDE - Abnormal; Notable for the following:    B Natriuretic Peptide 739.7 (*)    All other components within normal limits  TROPONIN I - Abnormal; Notable for the following:    Troponin I 0.04 (*)    All other components within normal limits  URINALYSIS, ROUTINE W REFLEX MICROSCOPIC - Abnormal; Notable for the following:    Color, Urine STRAW (*)    All other components within normal limits  HEMOGLOBIN A1C - Abnormal; Notable for the following:    Hgb A1c MFr Bld 7.1 (*)    All other components within normal limits  TROPONIN I - Abnormal; Notable for the following:    Troponin I 0.05 (*)    All other components within normal limits  TROPONIN I - Abnormal; Notable for the following:    Troponin I 0.05 (*)    All other components within normal limits  TROPONIN I - Abnormal; Notable for the following:    Troponin I 0.05 (*)    All other components within normal limits  GLUCOSE, CAPILLARY - Abnormal; Notable for the following:    Glucose-Capillary 142 (*)    All other components within normal limits  GLUCOSE, CAPILLARY - Abnormal; Notable for the following:    Glucose-Capillary 138 (*)    All other components within normal limits  BASIC METABOLIC PANEL - Abnormal; Notable for the following:    Sodium 129 (*)    Chloride 94 (*)    Glucose, Bld 222 (*)    BUN 38 (*)    Creatinine, Ser 2.14  (*)    Calcium 8.7 (*)    GFR calc non Af Amer 27 (*)    GFR calc Af Amer 32 (*)    All other components within normal limits  CBC - Abnormal; Notable for the following:    WBC  3.9 (*)    RBC 3.06 (*)    Hemoglobin 8.5 (*)    HCT 26.3 (*)    All other components within normal limits  GLUCOSE, CAPILLARY - Abnormal; Notable for the following:    Glucose-Capillary 219 (*)    All other components within normal limits  GLUCOSE, CAPILLARY - Abnormal; Notable for the following:    Glucose-Capillary 234 (*)    All other components within normal limits  GLUCOSE, CAPILLARY - Abnormal; Notable for the following:    Glucose-Capillary 191 (*)    All other components within normal limits  DIGOXIN LEVEL  HEPATIC FUNCTION PANEL    EKG  EKG Interpretation  Date/Time:  Friday April 01 2017 20:17:11 EDT Ventricular Rate:  71 PR Interval:  228 QRS Duration: 142 QT Interval:  454 QTC Calculation: 493 R Axis:   165 Text Interpretation:  Sinus rhythm with 1st degree A-V block with occasional Premature ventricular complexes Non-specific intra-ventricular conduction block Cannot rule out Anterior infarct , age undetermined Abnormal ECG No significant change since last tracing Confirmed by Doug Sou [phone]) on 04/02/2017 3:24:27 PM       Radiology No results found.  Procedures Procedures (including critical care time)  Medications Ordered in ED Medications  perflutren lipid microspheres (DEFINITY) IV suspension (1 mL Intravenous Given 04/02/17 1556)  furosemide (LASIX) injection 60 mg (60 mg Intravenous Given 04/02/17 0515)     Initial Impression / Assessment and Plan / ED Course  I have reviewed the triage vital signs and the nursing notes.  Pertinent labs & imaging results that were available during my care of the patient were reviewed by me and considered in my medical decision making (see chart for details).    Evidence of fluid overload. Given IV Lasix. Will need general  diuresis given her borderline blood pressures. Discussed with hospitalist and will admit.  Final Clinical Impressions(s) / ED Diagnoses   Final diagnoses:  Acute combined systolic and diastolic congestive heart failure [facility])    New Prescriptions Discharge Medication List as of 04/03/2017 11:25 AM    I personally performed the services described in this documentation, which was scribed in my presence. The recorded information has been reviewed and is accurate.      Loren Racer, MD 04/04/17 1520

## 2017-04-02 NOTE — ED Notes (Signed)
Sent label to add on A1c

## 2017-04-02 NOTE — ED Notes (Signed)
Laural Benes, MD aware of trop 0.05

## 2017-04-02 NOTE — Progress Notes (Signed)
04/02/2017  2:09 AM  04/02/2017 2:35 PM  [name] was seen and examined.  The H&P by the admitting provider, orders, imaging was reviewed.  Please see new orders.  Will continue to follow.   Maryln Manuel, MD [facility]

## 2017-04-03 DIAGNOSIS — I5023 Acute on chronic systolic (congestive) heart failure: Secondary | ICD-10-CM | POA: Diagnosis not present

## 2017-04-03 DIAGNOSIS — I10 Essential (primary) hypertension: Secondary | ICD-10-CM

## 2017-04-03 DIAGNOSIS — Z794 Long term (current) use of insulin: Secondary | ICD-10-CM | POA: Diagnosis not present

## 2017-04-03 DIAGNOSIS — E871 Hypo-osmolality and hyponatremia: Secondary | ICD-10-CM

## 2017-04-03 DIAGNOSIS — D649 Anemia, unspecified: Secondary | ICD-10-CM

## 2017-04-03 DIAGNOSIS — N183 Chronic kidney disease, stage 3 (moderate): Secondary | ICD-10-CM | POA: Diagnosis not present

## 2017-04-03 DIAGNOSIS — I480 Paroxysmal atrial fibrillation: Secondary | ICD-10-CM | POA: Diagnosis not present

## 2017-04-03 DIAGNOSIS — E119 Type 2 diabetes mellitus without complications: Secondary | ICD-10-CM

## 2017-04-03 LAB — CBC
HEMATOCRIT: 26.3 % — AB (ref 39.0–52.0)
HEMOGLOBIN: 8.5 g/dL — AB (ref 13.0–17.0)
MCH: 27.8 pg (ref 26.0–34.0)
MCHC: 32.3 g/dL (ref 30.0–36.0)
MCV: 85.9 fL (ref 78.0–100.0)
Platelets: 179 10*3/uL (ref 150–400)
RBC: 3.06 MIL/uL — ABNORMAL LOW (ref 4.22–5.81)
RDW: 14.4 % (ref 11.5–15.5)
WBC: 3.9 10*3/uL — AB (ref 4.0–10.5)

## 2017-04-03 LAB — BASIC METABOLIC PANEL
ANION GAP: 7 (ref 5–15)
BUN: 38 mg/dL — ABNORMAL HIGH (ref 6–20)
CALCIUM: 8.7 mg/dL — AB (ref 8.9–10.3)
CO2: 28 mmol/L (ref 22–32)
Chloride: 94 mmol/L — ABNORMAL LOW (ref 101–111)
Creatinine, Ser: 2.14 mg/dL — ABNORMAL HIGH (ref 0.61–1.24)
GFR calc Af Amer: 32 mL/min — ABNORMAL LOW (ref >60)
GFR, EST NON AFRICAN AMERICAN: 27 mL/min — AB (ref >60)
Glucose, Bld: 222 mg/dL — ABNORMAL HIGH (ref 65–99)
POTASSIUM: 4.3 mmol/L (ref 3.5–5.1)
SODIUM: 129 mmol/L — AB (ref 135–145)

## 2017-04-03 LAB — GLUCOSE, CAPILLARY
GLUCOSE-CAPILLARY: 234 mg/dL — AB (ref 65–99)
Glucose-Capillary: 191 mg/dL — ABNORMAL HIGH (ref 65–99)

## 2017-04-03 MED ORDER — APIXABAN 2.5 MG PO TABS: 2.5000 mg | ORAL_TABLET | Freq: Two times a day (BID) | ORAL | 0 refills | Status: DC

## 2017-04-03 MED ORDER — INSULIN ASPART 100 UNIT/ML ~~LOC~~ SOLN
3.0000 [IU] | Freq: Three times a day (TID) | SUBCUTANEOUS | Status: DC
  Administered 2017-04-03 (×2): 3 [IU] via SUBCUTANEOUS

## 2017-04-03 MED ORDER — TORSEMIDE 20 MG PO TABS: 80.0000 mg | ORAL_TABLET | Freq: Two times a day (BID) | ORAL | Status: DC

## 2017-04-03 MED ORDER — TORSEMIDE 20 MG PO TABS
ORAL_TABLET | ORAL | 3 refills | Status: DC
Start: 2017-04-03 — End: 2017-04-22

## 2017-04-03 MED ORDER — INSULIN GLARGINE 100 UNIT/ML ~~LOC~~ SOLN
12.0000 [IU] | SUBCUTANEOUS | Status: DC
  Administered 2017-04-03: 12 [IU] via SUBCUTANEOUS
  Filled 2017-04-03: qty 0.12

## 2017-04-03 NOTE — Care Management Obs Status (Signed)
MEDICARE OBSERVATION STATUS NOTIFICATION   Patient Details  Name: [name] MRN: [id number] Date of Birth: 03/06/1935   Medicare Observation Status Notification Given:  Yes    CrutchfieldDerrill Memo, RN 04/03/2017, 12:27 PM

## 2017-04-03 NOTE — Care Management CC44 (Signed)
Condition Code 44 Documentation Completed  Patient Details  Name: [name] MRN: [id number] Date of Birth: November 13, 1934   Condition Code 44 given:  Yes Patient signature on Condition Code 44 notice:  Yes Documentation of 2 MD's agreement:  Yes Code 44 added to claim:  Yes    Yvone Neu, RN 04/03/2017, 12:27 PM

## 2017-04-03 NOTE — Discharge Summary (Addendum)
Physician Discharge Summary  [name] ZDG:[id number] DOB: 29-May-1935 DOA: 04/02/2017  PCP: [name], MD Cardiologist: Dr. [name]   Admit date: 04/02/2017 Discharge date: 04/03/2017  Admitted From: HOME  Disposition: HOME   Recommendations for Outpatient Follow-up:  1. Follow up with PCP in 1 weeks 2. Please obtain BMP/CBC in one week 3. Follow up with Dr. [name] in 2 weeks (cardiology) 4. I discontinued metformin due to CKD 5. I reduced dose of eliquis to 2.5 mg BID based on pharmD recommendations due to renal function 6. Consider outpatient nephrology referral  Discharge Condition: STABLE   CODE STATUS: FULL    Brief Hospitalization Summary: Please see all hospital notes, images, labs for full details of the hospitalization.  HPI: [name] is a 82 y.o. male with medical history significant for coronary artery disease, paroxysmal atrial fibrillation on Eliquis, chronic kidney disease stage III, insulin-dependent diabetes mellitus, and chronic systolic CHF with EF 10-15%, now presenting to the emergency department with shortness of breath and increased leg swelling. Patient reports that he had been in his usual state until yesterday when he noted shortness of breath, worse with exertion. He reports sleeping in a recliner the night before due to orthopnea. He also noted some increased lower extremity edema, and also reports feeling some swelling around his lower abdomen. He denies any significant cough, but notes that the dyspnea is worsening and now present with any minimal exertion. No recent fevers or chills. He denies melena or hematochezia. He reports taking an extra dose of his diuretic yesterday with no appreciable effect.   ED Course: Upon arrival to the ED, patient is found to be afebrile, saturating well on room air, and with vitals otherwise stable. EKG features a sinus rhythm with first grieving no block and nonspecific interventricular conduction delay. Chest  x-ray demonstrates moderate pulmonary edema with possible small pleural effusions consistent with acute CHF. Chemistry panel reveals a sodium of 131, BUN 38, and creatinine of 2.12, similar to priors. CBC is notable for a normocytic anemia with hemoglobin of 9.0, down from 10.6 in April 2018. Troponin is slightly elevated to 0.04 and BNP is elevated to 740, higher than priors. Urinalysis is unremarkable. Patient was treated with 40 mg IV Lasix in the ED, remained hemodynamically stable and in no apparent respiratory distress while at rest, and he will be admitted to the telemetry unit for ongoing evaluation and management of acute on chronic systolic CHF.  Assessment/Plan  1. Acute on chronic systolic CHF  - Pt presents with 1-2 days of progressive SOB, peripheral edema, orthopnea  - Noted to have pulmonary edema on CXR and labored breathing with minimal exertion  - Wt appears to be stable  - Echo from January 2018 with EF 10-15%, mild LVH, severe diffuse HK, severe LAE, and mild TR  - He was diuresed with IV lasix and lost nearly 3 Liters and felt much better and asked to be discharged home. - He says he will follow up with his PCP and cardiologist.  - Will increase his diuretic dose to torsemide 100 mg in morning and 80 mg in the evening.   ECHO 04/02/17 ------------------------------------------------------------------- History:   PMH:  Ischemic Cardiomyopathy.  Atrial fibrillation. Risk factors:  Hypertension. Diabetes mellitus. Dyslipidemia.  ------------------------------------------------------------------- Study Conclusions  - Left ventricle: Wall thickness was increased in a pattern of  moderate LVH. Systolic function was moderately reduced. The  estimated ejection fraction was in the range of 35% to 40%.  Dyskinesis  of the mid-apicalanteroseptal and apical myocardium. - Mitral valve: There was mild to moderate regurgitation. - Left atrium: The atrium was mildly dilated. - Right  ventricle: Systolic function was mildly reduced. - Right atrium: The atrium was mildly dilated.   2. Paroxysmal atrial fibrillation (STABLE) - In a sinus rhythm on admission  - CHADS-VASc is 74 (age x2, CHF, DM, CAD)  - Continue Eliquis, digoxin, amiodarone    3. CKD stage IV - SCr is 2.12 on admission - Stable relative to priors  - Follow daily chem panel during diuresis    4. Insulin-dependent DM  - No A1c on file  - Managed at home with glyburide, metformin, and Lantus 10-40 units BID per sliding-scale  - Serum glucose is only 67 on arrival  - Discontinued glucovance at discharge given CKD   5. Hyponatremia - Chronic - stable  - Serum sodium is 131 on admission, similar to priors  - Repeat BMP with PCP in 1 week.    6. Anemia  - Hgb is 9.0 on admission, down from 10.6 in April 2018  - Pt denies melena or hematochezia  - Possibly dilutional in setting of hypervolemia  - Repeat CBC in 1 week with PCP    DVT prophylaxis: Eliquis  Code Status: Full  Family Communication: Discussed with patient Disposition Plan: Admit to telemetry Admission status: Inpatient    Discharge Diagnoses:  Principal Problem:   Acute on chronic systolic CHF (congestive heart failure) (HCC) Active Problems:   Insulin dependent diabetes mellitus (HCC)   Essential hypertension   Rheumatoid arthritis (HCC)   Paroxysmal atrial fibrillation (HCC)   CKD (chronic kidney disease), stage III   Hyponatremia   Normocytic anemia  Discharge Instructions: Discharge Instructions    (HEART FAILURE PATIENTS) Call MD:  Anytime you have any of the following symptoms: 1) 3 pound weight gain in 24 hours or 5 pounds in 1 week 2) shortness of breath, with or without a dry hacking cough 3) swelling in the hands, feet or stomach 4) if you have to sleep on extra pillows at night in order to breathe.    Complete by:  As directed    Call MD for:  difficulty breathing, headache or visual disturbances    Complete  by:  As directed    Call MD for:  extreme fatigue    Complete by:  As directed    Call MD for:  persistant dizziness or light-headedness    Complete by:  As directed    Call MD for:  persistant nausea and vomiting    Complete by:  As directed    Call MD for:  severe uncontrolled pain    Complete by:  As directed    Call MD for:  temperature >100.4    Complete by:  As directed    Diet - low sodium heart healthy    Complete by:  As directed    Increase activity slowly    Complete by:  As directed      Allergies as of 04/03/2017   No Known Allergies     Medication List    STOP taking these medications   glyBURIDE-metformin 5-500 MG tablet Commonly known as:  GLUCOVANCE     TAKE these medications   amiodarone 200 MG tablet Commonly known as:  PACERONE Take 1 tablet (200 mg total) by mouth daily.   apixaban 2.5 MG Tabs tablet Commonly known as:  ELIQUIS Take 1 tablet (2.5 mg total) by mouth 2 (two)  times daily. What changed:  medication strength  how much to take   carvedilol 3.125 MG tablet Commonly known as:  COREG Take 1 tablet (3.125 mg total) by mouth 2 (two) times daily with a meal.   digoxin 0.125 MG tablet Commonly known as:  LANOXIN Take 0.0625 mg by mouth daily.   docusate sodium 100 MG capsule Commonly known as:  COLACE Take 100 mg by mouth as needed for mild constipation.   LANTUS 100 UNIT/ML injection Generic drug:  insulin glargine Inject 10-40 Units into the skin 2 (two) times daily as needed (high blood sugar). Sliding scale.  If cbg= 110: none, cbg=200 take 25-30 units, cbg over 250 take 40 units   LIPITOR 10 MG tablet Generic drug:  atorvastatin Take 10 mg by mouth daily.   losartan 25 MG tablet Commonly known as:  COZAAR Take 0.5 tablets (12.5 mg total) by mouth daily.   NEXIUM 40 MG capsule Generic drug:  esomeprazole Take 40 mg by mouth daily.   polyethylene glycol packet Commonly known as:  MIRALAX / GLYCOLAX Take 17 g by mouth  daily as needed for mild constipation.   potassium chloride SA 20 MEQ tablet Commonly known as:  K-DUR,KLOR-CON Take 2 tablets (40 mEq total) by mouth daily.   spironolactone 25 MG tablet Commonly known as:  ALDACTONE Take 25 mg by mouth daily.   torsemide 20 MG tablet Commonly known as:  DEMADEX Take 100 mg in morning and 80 mg in the evening What changed:  how much to take  how to take this  when to take this  additional instructions      Follow-up Information    [name], MD. Schedule an appointment as soon as possible for a visit in 1 week(s).   Specialty:  Family Medicine Contact information: [address] [place] [address] [phone]        Lewayne Bunting, MD. Schedule an appointment as soon as possible for a visit in 2 week(s).   Specialty:  Cardiology Why:  Hospital Follow Up  Contact information: [address] [place] [address] [phone]          No Known Allergies Current Discharge Medication List    CONTINUE these medications which have CHANGED   Details  apixaban (ELIQUIS) 2.5 MG TABS tablet Take 1 tablet (2.5 mg total) by mouth 2 (two) times daily. Qty: 180 tablet, Refills: 0    torsemide (DEMADEX) 20 MG tablet Take 100 mg in morning and 80 mg in the evening Qty: 540 tablet, Refills: 3      CONTINUE these medications which have NOT CHANGED   Details  amiodarone (PACERONE) 200 MG tablet Take 1 tablet (200 mg total) by mouth daily. Qty: 90 tablet, Refills: 3   Associated Diagnoses: Ischemic cardiomyopathy    atorvastatin (LIPITOR) 10 MG tablet Take 10 mg by mouth daily.      carvedilol (COREG) 3.125 MG tablet Take 1 tablet (3.125 mg total) by mouth 2 (two) times daily with a meal. Qty: 90 tablet, Refills: 3    digoxin (LANOXIN) 0.125 MG tablet Take 0.0625 mg by mouth daily.     docusate sodium (COLACE) 100 MG capsule Take 100 mg by mouth as needed for mild constipation.    esomeprazole  (NEXIUM) 40 MG capsule Take 40 mg by mouth daily.      insulin glargine (LANTUS) 100 UNIT/ML injection Inject 10-40 Units into the skin 2 (two) times daily as needed (high  blood sugar). Sliding scale.  If cbg= 110: none, cbg=200 take 25-30 units, cbg over 250 take 40 units    losartan (COZAAR) 25 MG tablet Take 0.5 tablets (12.5 mg total) by mouth daily. Qty: 45 tablet, Refills: 3    polyethylene glycol (MIRALAX / GLYCOLAX) packet Take 17 g by mouth daily as needed for mild constipation.     potassium chloride SA (K-DUR,KLOR-CON) 20 MEQ tablet Take 2 tablets (40 mEq total) by mouth daily. Qty: 30 tablet, Refills: 6    spironolactone (ALDACTONE) 25 MG tablet Take 25 mg by mouth daily.      STOP taking these medications     glyBURIDE-metformin (GLUCOVANCE) 5-500 MG tablet         Procedures/Studies: Dg Chest 2 View  Result Date: 04/01/2017 CLINICAL DATA:  Shortness of breath.  Fluid retention. EXAM: CHEST  2 VIEW COMPARISON:  Radiograph 08/24/2016 FINDINGS: Left-sided pacemaker remains in place. Patient is post median sternotomy and CABG. Stable cardiomegaly. Atherosclerosis of the aortic arch. There is moderate central pulmonary edema. Possible trace pleural effusions. No confluent airspace disease. No pneumothorax. No acute osseous abnormality per IMPRESSION: Moderate pulmonary edema and possible small pleural effusions consistent with CHF/fluid overload. Cardiomegaly is stable. Electronically Signed   By: Rubye Oaks M.D.   On: 04/01/2017 20:36      Subjective: Pt report that he is feeling much better and asking to go home.     Discharge Exam: Vitals:   04/03/17 0448 04/03/17 0818  BP: (!) 95/56 (!) 100/54  Pulse: 68   Resp: 18   Temp: 98 F (36.7 C)   SpO2: 98%    Vitals:   04/02/17 2135 04/03/17 0448 04/03/17 0457 04/03/17 0818  BP: (!) 95/51 (!) 95/56  (!) 100/54  Pulse: 65 68    Resp:  18    Temp: 97.8 F (36.6 C) 98 F (36.7 C)    TempSrc: Oral Oral     SpO2: 100% 98%    Weight:   86.2 kg (190 lb 1.6 oz)   Height:       General: Pt is alert, awake, not in acute distress Cardiovascular: normal S1/S2 +, no rubs, no gallops Respiratory: CTA bilaterally, no wheezing, no rhonchi Abdominal: Soft, NT, ND, bowel sounds + Extremities: trace pretibial edema   The results of significant diagnostics from this hospitalization (including imaging, microbiology, ancillary and laboratory) are listed below for reference.     Microbiology: No results found for this or any previous visit (from the past 240 hour(s)).   Labs: BNP (last 3 results)  Recent Labs  12/03/16 0951 12/31/16 1040 04/02/17 0313  BNP 568.5* 408.2* 739.7*   Basic Metabolic Panel:  Recent Labs Lab 04/01/17 2017 04/03/17 0429  NA 131* 129*  K 4.2 4.3  CL 94* 94*  CO2 25 28  GLUCOSE 67 222*  BUN 38* 38*  CREATININE 2.12* 2.14*  CALCIUM 8.8* 8.7*   Liver Function Tests:  Recent Labs Lab 04/02/17 0313  AST 26  ALT 30  ALKPHOS 98  BILITOT 0.9  PROT 8.0  ALBUMIN 3.8   No results for input(s): LIPASE, AMYLASE in the last 168 hours. No results for input(s): AMMONIA in the last 168 hours. CBC:  Recent Labs Lab 04/01/17 2017 04/03/17 0429  WBC 5.2 3.9*  HGB 9.0* 8.5*  HCT 27.1* 26.3*  MCV 86.3 85.9  PLT 193 179   Cardiac Enzymes:  Recent Labs Lab 04/02/17 0313 04/02/17 0608 04/02/17 1256 04/02/17 1759  TROPONINI  0.04* 0.05* 0.05* 0.05*   BNP: Invalid input(s): POCBNP CBG:  Recent Labs Lab 04/02/17 1143 04/02/17 1641 04/02/17 2141 04/03/17 0701  GLUCAP 142* 138* 219* 234*   D-Dimer No results for input(s): DDIMER in the last 72 hours. Hgb A1c  Recent Labs  04/02/17 0453  HGBA1C 7.1*   Lipid Profile No results for input(s): CHOL, HDL, LDLCALC, TRIG, CHOLHDL, LDLDIRECT in the last 72 hours. Thyroid function studies No results for input(s): TSH, T4TOTAL, T3FREE, THYROIDAB in the last 72 hours.  Invalid input(s): FREET3 Anemia  work up No results for input(s): VITAMINB12, FOLATE, FERRITIN, TIBC, IRON, RETICCTPCT in the last 72 hours. Urinalysis    Component Value Date/Time   COLORURINE STRAW (A) 04/02/2017 0340   APPEARANCEUR CLEAR 04/02/2017 0340   LABSPEC 1.005 04/02/2017 0340   PHURINE 7.0 04/02/2017 0340   GLUCOSEU NEGATIVE 04/02/2017 0340   HGBUR NEGATIVE 04/02/2017 0340   BILIRUBINUR NEGATIVE 04/02/2017 0340   KETONESUR NEGATIVE 04/02/2017 0340   PROTEINUR NEGATIVE 04/02/2017 0340   NITRITE NEGATIVE 04/02/2017 0340   LEUKOCYTESUR NEGATIVE 04/02/2017 0340   Sepsis Labs Invalid input(s): PROCALCITONIN,  WBC,  LACTICIDVEN Microbiology No results found for this or any previous visit (from the past 240 hour(s)).  Time coordinating discharge: 31 minutes  SIGNED:  Standley Dakins, MD  [facility] 04/03/2017, 11:12 AM Pager [phone]  If 7PM-7AM, please contact night-coverage [email or web address] Password [email or web address]

## 2017-04-12 NOTE — Progress Notes (Signed)
HPI: FU coronary disease, status post coronary artery bypassing graft, ischemic cardiomyopathy, hypertension, diabetes, and hyperlipidemia. He also has had a previous defibrillator placed. Patient previously admitted 1/18 for heart failure/cardiogenic shock. He was noted to have MAT and atrial fibrillation and started on amiodarone. Echocardiogram showed ejection fraction 15%, grade 3 DD, mild to moderate MR, mild LAE; moderate TR. Cardiac catheterization showed a 50% left main, occluded LAD, occluded circumflex and occluded right coronary artery. LIMA to LAD patent. Sequential saphenous vein graft to the first and second marginal patent. Saphenous vein graft to the PDA patent. Pulmonary capillary wedge pressure 19. Treated medically including course of milrinone. Course complicated by transaminitis and renal insuff. Echocardiogram repeated August 2018 and showed ejection fraction 35-40%, mild biatrial enlargement and mildly reduced RV function. Patient recently admitted with congestive heart failure and improved with diuresis. Since last seen, he denies dyspnea, chest pain, palpitations or syncope. He has had increased pedal edema.  Current Outpatient Prescriptions  Medication Sig Dispense Refill  . amiodarone (PACERONE) 200 MG tablet Take 1 tablet (200 mg total) by mouth daily. 90 tablet 3  . apixaban (ELIQUIS) 2.5 MG TABS tablet Take 1 tablet (2.5 mg total) by mouth 2 (two) times daily. 180 tablet 0  . atorvastatin (LIPITOR) 10 MG tablet Take 10 mg by mouth daily.      . carvedilol (COREG) 3.125 MG tablet Take 1 tablet (3.125 mg total) by mouth 2 (two) times daily with a meal. 90 tablet 3  . digoxin (LANOXIN) 0.125 MG tablet Take 0.0625 mg by mouth daily.     Marland Kitchen docusate sodium (COLACE) 100 MG capsule Take 100 mg by mouth as needed for mild constipation.    Marland Kitchen esomeprazole (NEXIUM) 40 MG capsule Take 40 mg by mouth daily.      . insulin glargine (LANTUS) 100 UNIT/ML injection Inject 10-40 Units  into the skin 2 (two) times daily as needed (high blood sugar). Sliding scale.  If cbg= 110: none, cbg=200 take 25-30 units, cbg over 250 take 40 units    . losartan (COZAAR) 25 MG tablet Take 0.5 tablets (12.5 mg total) by mouth daily. 45 tablet 3  . polyethylene glycol (MIRALAX / GLYCOLAX) packet Take 17 g by mouth daily as needed for mild constipation.     . potassium chloride SA (K-DUR,KLOR-CON) 20 MEQ tablet Take 2 tablets (40 mEq total) by mouth daily. 30 tablet 6  . spironolactone (ALDACTONE) 25 MG tablet Take 25 mg by mouth daily.    Marland Kitchen torsemide (DEMADEX) 20 MG tablet Take 100 mg in morning and 80 mg in the evening 540 tablet 3   No current facility-administered medications for this visit.      Past Medical History:  Diagnosis Date  . Congestive heart failure, unspecified   . Coronary atherosclerosis of unspecified type of vessel, native or graft   . ICD (implantable cardiac defibrillator), single BSX    DOI 2008  . Ischemic cardiomyopathy   . Other and unspecified hyperlipidemia   . Peptic ulcer, unspecified site, unspecified as acute or chronic, without mention of hemorrhage, perforation, or obstruction   . Rheumatoid arthritis(714.0)   . Type II or unspecified type diabetes mellitus without mention of complication, not stated as uncontrolled   . Unspecified essential hypertension   . Ventricular tachycardia (HCC)    Rx via ICD 5 /2012    Past Surgical History:  Procedure Laterality Date  . CARDIAC CATHETERIZATION N/A 09/01/2016   Procedure: Right/Left Heart  Cath and Coronary/Graft Angiography;  Surgeon: Laurey Morale, MD;  Location: [facility] INVASIVE CV LAB;  Service: Cardiovascular;  Laterality: N/A;  . CARDIAC DEFIBRILLATOR PLACEMENT     status post implantable  . coronary artey bypass graft     status post  . decomppression (other)     left median nerve  . IMPLANTABLE CARDIOVERTER DEFIBRILLATOR (ICD) GENERATOR CHANGE N/A 01/25/2014   Procedure: ICD GENERATOR CHANGE;   Surgeon: Duke Salvia, MD;  Location: [facility] CATH LAB;  Service: Cardiovascular;  Laterality: N/A;    Social History   Social History  . Marital status: Married    Spouse name: N/A  . Number of children: N/A  . Years of education: N/A   Occupational History  . Not on file.   Social History Main Topics  . Smoking status: Never Smoker  . Smokeless tobacco: Never Used  . Alcohol use No  . Drug use: No  . Sexual activity: Not on file   Other Topics Concern  . Not on file   Social History Narrative   Married. Has 1 surviving child and 4 surviving grandchildren.     Family History  Problem Relation Age of Onset  . Coronary artery disease Sister   . Coronary artery disease Brother     ROS: no fevers or chills, productive cough, hemoptysis, dysphasia, odynophagia, melena, hematochezia, dysuria, hematuria, rash, seizure activity, orthopnea, PND, pedal edema, claudication. Remaining systems are negative.  Physical Exam: Well-developed well-nourished in no acute distress.  Skin is warm and dry.  HEENT is normal.  Neck is supple.  Chest is clear to auscultation with normal expansion.  Cardiovascular exam is regular rate and rhythm.  Abdominal exam nontender or distended. No masses palpated. Extremities show 1-2+ edema. neuro grossly intact  Electrocardiogram 03/1717 personally reviewed. Sinus rhythm with occasional PVC, first-degree AV block, IVCD and poor R-wave progression.  A/P  1 Chronic systolic congestive heart failure-patient volume overloaded today. Not compliant with low Na diet and I explained importance of this. Increase demadex to 100 BID. Check potassium and renal function. Continue fluid restriction.  2 ischemic cardiomyopathy-continue ARB. Continue low-dose beta blocker and digoxin. Recent digoxin level 0.9.  3 coronary artery disease-continue statin. No aspirin given need for anticoagulation.  4 paroxysmal atrial fibrillation-continue amiodarone. Patient has  had recent chest x-ray and liver functions. Check TSH. Check free T4. Continue apixaban.  5 prior ICD-followed by electrophysiology.  6 chronic stage III kidney disease-check potassium and renal function.  Olga Millers, MD

## 2017-04-19 ENCOUNTER — Ambulatory Visit (INDEPENDENT_AMBULATORY_CARE_PROVIDER_SITE_OTHER): Payer: Medicare Other | Admitting: *Deleted

## 2017-04-19 DIAGNOSIS — I255 Ischemic cardiomyopathy: Secondary | ICD-10-CM

## 2017-04-20 NOTE — Progress Notes (Signed)
Remote ICD transmission.   

## 2017-04-22 ENCOUNTER — Ambulatory Visit (INDEPENDENT_AMBULATORY_CARE_PROVIDER_SITE_OTHER): Payer: Medicare Other | Admitting: Cardiology

## 2017-04-22 ENCOUNTER — Encounter: Payer: Self-pay | Admitting: Cardiology

## 2017-04-22 VITALS — BP 108/60 | HR 68 | Ht 70.0 in | Wt 207.0 lb

## 2017-04-22 DIAGNOSIS — I5022 Chronic systolic (congestive) heart failure: Secondary | ICD-10-CM

## 2017-04-22 DIAGNOSIS — N183 Chronic kidney disease, stage 3 unspecified: Secondary | ICD-10-CM

## 2017-04-22 DIAGNOSIS — I480 Paroxysmal atrial fibrillation: Secondary | ICD-10-CM | POA: Diagnosis not present

## 2017-04-22 DIAGNOSIS — I255 Ischemic cardiomyopathy: Secondary | ICD-10-CM | POA: Diagnosis not present

## 2017-04-22 LAB — BASIC METABOLIC PANEL
BUN/Creatinine Ratio: 19 (ref 10–24)
BUN: 34 mg/dL — AB (ref 8–27)
CALCIUM: 9.2 mg/dL (ref 8.6–10.2)
CHLORIDE: 90 mmol/L — AB (ref 96–106)
CO2: 26 mmol/L (ref 20–29)
CREATININE: 1.77 mg/dL — AB (ref 0.76–1.27)
GFR calc non Af Amer: 35 mL/min/{1.73_m2} — ABNORMAL LOW (ref >59)
GFR, EST AFRICAN AMERICAN: 41 mL/min/{1.73_m2} — AB (ref >59)
Glucose: 102 mg/dL — ABNORMAL HIGH (ref 65–99)
Potassium: 4 mmol/L (ref 3.5–5.2)
Sodium: 131 mmol/L — ABNORMAL LOW (ref 134–144)

## 2017-04-22 LAB — TSH: TSH: 49.4 u[IU]/mL — AB (ref 0.450–4.500)

## 2017-04-22 LAB — T4, FREE: Free T4: 0.78 ng/dL — ABNORMAL LOW (ref 0.82–1.77)

## 2017-04-22 MED ORDER — TORSEMIDE 100 MG PO TABS: 100.0000 mg | ORAL_TABLET | Freq: Two times a day (BID) | ORAL | 3 refills | Status: DC

## 2017-04-22 NOTE — Patient Instructions (Signed)
Medication Instructions:   INCREASE TORSEMIDE TO 100 MG TWICE DAILY  Labwork:  Your physician recommends that you HAVE LAB WORK TODAY  Follow-Up:  Your physician recommends that you schedule a follow-up appointment WITH [facility]NEXT [facility]AILABLE   Your physician recommends that you schedule a follow-up appointment in: 3 MONTHS WITH DR [name]    If you need a refill on your cardiac medications before your next appointment, please call your pharmacy.

## 2017-04-25 ENCOUNTER — Telehealth: Payer: Self-pay | Admitting: *Deleted

## 2017-04-25 DIAGNOSIS — R7989 Other specified abnormal findings of blood chemistry: Secondary | ICD-10-CM

## 2017-04-25 MED ORDER — LEVOTHYROXINE SODIUM 50 MCG PO TABS: 50.0000 ug | ORAL_TABLET | Freq: Every day | ORAL | 3 refills | Status: DC

## 2017-04-25 NOTE — Telephone Encounter (Signed)
Spoke with [name], Aware of dr crenshaw's recommendations. New script sent to the pharmacy and Lab orders mailed to the [name]  

## 2017-04-25 NOTE — Telephone Encounter (Signed)
-----   Message from Lewayne Bunting, MD sent at 04/22/2017  4:49 PM EDT ----- Synthroid 50 micrograms daily; TSH 12 weeks Olga Millers

## 2017-04-27 ENCOUNTER — Encounter: Payer: Self-pay | Admitting: Cardiology

## 2017-04-29 LAB — CUP PACEART REMOTE DEVICE CHECK
Date Time Interrogation Session: 20180904053200
HighPow Impedance: 42 Ohm
Implantable Lead Implant Date: 20080415
Implantable Lead Location: 753860
Implantable Lead Serial Number: 176041
Implantable Pulse Generator Implant Date: 20150612
Lead Channel Impedance Value: 425 Ohm
Lead Channel Pacing Threshold Pulse Width: 0.4 ms
Lead Channel Setting Pacing Amplitude: 2.4 V
MDC IDC MSMT BATTERY REMAINING LONGEVITY: 126 mo
MDC IDC MSMT BATTERY REMAINING PERCENTAGE: 100 %
MDC IDC MSMT LEADCHNL RV PACING THRESHOLD AMPLITUDE: 0.9 V
MDC IDC SET LEADCHNL RV PACING PULSEWIDTH: 0.4 ms
MDC IDC SET LEADCHNL RV SENSING SENSITIVITY: 0.5 mV
MDC IDC STAT BRADY RV PERCENT PACED: 0 %
Pulse Gen Serial Number: 126688

## 2017-05-02 ENCOUNTER — Other Ambulatory Visit: Payer: Self-pay | Admitting: *Deleted

## 2017-05-02 DIAGNOSIS — I480 Paroxysmal atrial fibrillation: Secondary | ICD-10-CM

## 2017-05-02 MED ORDER — TORSEMIDE 100 MG PO TABS: 100.0000 mg | ORAL_TABLET | Freq: Two times a day (BID) | ORAL | 3 refills | Status: AC

## 2017-05-02 NOTE — Telephone Encounter (Signed)
Patient left a msg on the refill vm requesting that [facility] He requested a call from the office when this has been sent in. He did not leave a call back number. Thanks, MI

## 2017-05-04 ENCOUNTER — Emergency Department (HOSPITAL_COMMUNITY): Payer: Medicare Other

## 2017-05-04 ENCOUNTER — Encounter (HOSPITAL_COMMUNITY): Payer: Self-pay

## 2017-05-04 ENCOUNTER — Emergency Department (HOSPITAL_COMMUNITY)
Admission: EM | Admit: 2017-05-04 | Discharge: 2017-05-04 | Disposition: A | Discharge status: Home / Self Care | Payer: Medicare Other | Attending: Emergency Medicine | Admitting: Emergency Medicine

## 2017-05-04 DIAGNOSIS — N183 Chronic kidney disease, stage 3 (moderate): Secondary | ICD-10-CM | POA: Diagnosis not present

## 2017-05-04 DIAGNOSIS — Z7901 Long term (current) use of anticoagulants: Secondary | ICD-10-CM | POA: Diagnosis not present

## 2017-05-04 DIAGNOSIS — R531 Weakness: Secondary | ICD-10-CM | POA: Insufficient documentation

## 2017-05-04 DIAGNOSIS — I130 Hypertensive heart and chronic kidney disease with heart failure and stage 1 through stage 4 chronic kidney disease, or unspecified chronic kidney disease: Secondary | ICD-10-CM | POA: Diagnosis not present

## 2017-05-04 DIAGNOSIS — I2510 Atherosclerotic heart disease of native coronary artery without angina pectoris: Secondary | ICD-10-CM | POA: Diagnosis not present

## 2017-05-04 DIAGNOSIS — Z794 Long term (current) use of insulin: Secondary | ICD-10-CM | POA: Diagnosis not present

## 2017-05-04 DIAGNOSIS — I509 Heart failure, unspecified: Secondary | ICD-10-CM | POA: Diagnosis not present

## 2017-05-04 DIAGNOSIS — E119 Type 2 diabetes mellitus without complications: Secondary | ICD-10-CM | POA: Diagnosis not present

## 2017-05-04 LAB — CBC
HEMATOCRIT: 29.1 % — AB (ref 39.0–52.0)
Hemoglobin: 9.9 g/dL — ABNORMAL LOW (ref 13.0–17.0)
MCH: 27.7 pg (ref 26.0–34.0)
MCHC: 34 g/dL (ref 30.0–36.0)
MCV: 81.5 fL (ref 78.0–100.0)
PLATELETS: 174 10*3/uL (ref 150–400)
RBC: 3.57 MIL/uL — AB (ref 4.22–5.81)
RDW: 14 % (ref 11.5–15.5)
WBC: 4.3 10*3/uL (ref 4.0–10.5)

## 2017-05-04 LAB — URINALYSIS, ROUTINE W REFLEX MICROSCOPIC
Bilirubin Urine: NEGATIVE
GLUCOSE, UA: NEGATIVE mg/dL
HGB URINE DIPSTICK: NEGATIVE
KETONES UR: NEGATIVE mg/dL
LEUKOCYTES UA: NEGATIVE
Nitrite: NEGATIVE
PH: 7 (ref 5.0–8.0)
PROTEIN: NEGATIVE mg/dL
Specific Gravity, Urine: 1.008 (ref 1.005–1.030)

## 2017-05-04 LAB — BASIC METABOLIC PANEL
Anion gap: 17 — ABNORMAL HIGH (ref 5–15)
BUN: 41 mg/dL — ABNORMAL HIGH (ref 6–20)
CHLORIDE: 88 mmol/L — AB (ref 101–111)
CO2: 25 mmol/L (ref 22–32)
Calcium: 9.3 mg/dL (ref 8.9–10.3)
Creatinine, Ser: 1.88 mg/dL — ABNORMAL HIGH (ref 0.61–1.24)
GFR calc non Af Amer: 32 mL/min — ABNORMAL LOW (ref >60)
GFR, EST AFRICAN AMERICAN: 37 mL/min — AB (ref >60)
Glucose, Bld: 160 mg/dL — ABNORMAL HIGH (ref 65–99)
POTASSIUM: 4 mmol/L (ref 3.5–5.1)
Sodium: 130 mmol/L — ABNORMAL LOW (ref 135–145)

## 2017-05-04 LAB — DIGOXIN LEVEL: DIGOXIN LVL: 0.6 ng/mL — AB (ref 0.8–2.0)

## 2017-05-04 LAB — CBG MONITORING, ED: Glucose-Capillary: 128 mg/dL — ABNORMAL HIGH (ref 65–99)

## 2017-05-04 NOTE — ED Notes (Signed)
Pt came and advised he may end up leaving but would let staff know before he did.

## 2017-05-04 NOTE — ED Triage Notes (Signed)
Patient complains of not feeling well when he awoke this am. Alert and oriented on assessment. Family reports that patient was incontinent this am and blood sugar greater than 200. No pain. NAD

## 2017-05-04 NOTE — ED Notes (Signed)
Pt taken to Xray.

## 2017-05-04 NOTE — ED Provider Notes (Signed)
[facility]-EMERGENCY DEPT Provider Note   CSN: [id number] Arrival date & time: 05/04/17  9622     History   Chief Complaint Chief Complaint  Patient presents with  . Weakness    HPI [name] is a 82 y.o. male.  The history is provided by the patient and a relative. No language interpreter was used.  Weakness     [name] is a 82 y.o. male who presents to the Emergency Department complaining of weakness.  He presents for evaluation of not quite feeling right. Level V caveat due to confusion. Per family he has been experiencing increased confusion over the last several months. He had an episode of worsening confusion this morning. He does not recall what happened to trigger him to come in today. He was recently started on thyroid medication as well as increased diuretic. He denies any headache, chest pain, shortness breath, melena, nausea, vomiting. Sxs are waxing and waning in nature.  Past Medical History:  Diagnosis Date  . Congestive heart failure, unspecified   . Coronary atherosclerosis of unspecified type of vessel, native or graft   . ICD (implantable cardiac defibrillator), single BSX    DOI 2008  . Ischemic cardiomyopathy   . Other and unspecified hyperlipidemia   . Peptic ulcer, unspecified site, unspecified as acute or chronic, without mention of hemorrhage, perforation, or obstruction   . Rheumatoid arthritis(714.0)   . Type II or unspecified type diabetes mellitus without mention of complication, not stated as uncontrolled   . Unspecified essential hypertension   . Ventricular tachycardia (HCC)    Rx via ICD 5 /2012    Patient Active Problem List   Diagnosis Date Noted  . Hyponatremia 04/02/2017  . Normocytic anemia 04/02/2017  . OA (osteoarthritis) 11/19/2016  . Paroxysmal atrial fibrillation (HCC) 09/10/2016  . CKD (chronic kidney disease), stage III 09/10/2016  . Physical deconditioning 09/10/2016  . Single implantable cardioverter-defibrillator BSX     . Ventricular tachycardia-treated the ATP 01/29/2011  . Hx of CABG 04/17/2009  . Cardiomyopathy, ischemic 04/17/2009  . Acute on chronic systolic CHF (congestive heart failure) (HCC) 04/17/2009  . Insulin dependent diabetes mellitus (HCC) 04/12/2009  . Hyperlipidemia LDL goal <70 04/12/2009  . Essential hypertension 04/12/2009  . PUD 04/12/2009  . Rheumatoid arthritis (HCC) 04/12/2009    Past Surgical History:  Procedure Laterality Date  . CARDIAC CATHETERIZATION N/A 09/01/2016   Procedure: Right/Left Heart Cath and Coronary/Graft Angiography;  Surgeon: Laurey Morale, MD;  Location: [facility] INVASIVE CV LAB;  Service: Cardiovascular;  Laterality: N/A;  . CARDIAC DEFIBRILLATOR PLACEMENT     status post implantable  . coronary artey bypass graft     status post  . decomppression (other)     left median nerve  . IMPLANTABLE CARDIOVERTER DEFIBRILLATOR (ICD) GENERATOR CHANGE N/A 01/25/2014   Procedure: ICD GENERATOR CHANGE;  Surgeon: Duke Salvia, MD;  Location: [facility] CATH LAB;  Service: Cardiovascular;  Laterality: N/A;       Home Medications    Prior to Admission medications   Medication Sig Start Date End Date Taking? Authorizing Provider  insulin glargine (LANTUS) 100 UNIT/ML injection Inject 10-40 Units into the skin 2 (two) times daily as needed (high blood sugar). Sliding scale.  If cbg= 110: none, cbg=200 take 25-30 units, cbg over 250 take 40 units   Yes [provider]  amiodarone (PACERONE) 200 MG tablet Take 1 tablet (200 mg total) by mouth daily. 09/27/16   Bensimhon, Bevelyn Buckles, MD  apixaban (  ELIQUIS) 2.5 MG TABS tablet Take 1 tablet (2.5 mg total) by mouth 2 (two) times daily. 04/03/17 07/02/17  Johnson, Clanford L, MD  atorvastatin (LIPITOR) 10 MG tablet Take 10 mg by mouth daily.      [provider]  carvedilol (COREG) 3.125 MG tablet Take 1 tablet (3.125 mg total) by mouth 2 (two) times daily with a meal. 12/31/16 12/26/17  Tillery, Mariam Dollar, PA-C    digoxin (LANOXIN) 0.125 MG tablet Take 0.0625 mg by mouth daily.     [provider]  docusate sodium (COLACE) 100 MG capsule Take 100 mg by mouth as needed for mild constipation.    [provider]  esomeprazole (NEXIUM) 40 MG capsule Take 40 mg by mouth daily.      [provider]  levothyroxine (SYNTHROID) 50 MCG tablet Take 1 tablet (50 mcg total) by mouth daily before breakfast. 04/25/17   [name], Madolyn Frieze, MD  losartan (COZAAR) 25 MG tablet Take 0.5 tablets (12.5 mg total) by mouth daily. 11/19/16   Graciella Freer, PA-C  polyethylene glycol (MIRALAX / Ethelene Hal) packet Take 17 g by mouth daily as needed for mild constipation.     [provider]  potassium chloride SA (K-DUR,KLOR-CON) 20 MEQ tablet Take 2 tablets (40 mEq total) by mouth daily. 09/03/16   Leone Brand, NP  spironolactone (ALDACTONE) 25 MG tablet Take 25 mg by mouth daily.    [provider]  torsemide (DEMADEX) 100 MG tablet Take 1 tablet (100 mg total) by mouth 2 (two) times daily. 05/02/17   Rollene Rotunda, MD    Family History Family History  Problem Relation Age of Onset  . Coronary artery disease Sister   . Coronary artery disease Brother     Social History Social History  Substance Use Topics  . Smoking status: Never Smoker  . Smokeless tobacco: Never Used  . Alcohol use No     Allergies   Patient has no known allergies.   Review of Systems Review of Systems  Neurological: Positive for weakness.  All other systems reviewed and are negative.    Physical Exam Updated Vital Signs BP 103/67 (BP Location: Right Arm)   Pulse 92   Temp (!) 97.5 F (36.4 C) (Oral)   Resp 19   SpO2 97%   Physical Exam  Constitutional: He appears well-developed and well-nourished.  HENT:  Head: Normocephalic and atraumatic.  Cardiovascular: Normal rate and regular rhythm.   No murmur heard. Pulmonary/Chest: Effort normal and breath sounds normal. No  respiratory distress.  Abdominal: Soft. There is no tenderness. There is no rebound and no guarding.  Musculoskeletal: He exhibits no tenderness.  Nonpitting edema to bilateral lower extremities  Neurological: He is alert.  Disoriented to year. He is oriented to place and knows the current president.5 Out of 5 strength in all 4 extremities  Skin: Skin is warm and dry.  Psychiatric: He has a normal mood and affect. His behavior is normal.  Nursing note and vitals reviewed.    ED Treatments / Results  Labs (all labs ordered are listed, but only abnormal results are displayed) Labs Reviewed  BASIC METABOLIC PANEL - Abnormal; Notable for the following:       Result Value   Sodium 130 (*)    Chloride 88 (*)    Glucose, Bld 160 (*)    BUN 41 (*)    Creatinine, Ser 1.88 (*)    GFR calc non Af Amer 32 (*)  GFR calc Af Amer 37 (*)    Anion gap 17 (*)    All other components within normal limits  CBC - Abnormal; Notable for the following:    RBC 3.57 (*)    Hemoglobin 9.9 (*)    HCT 29.1 (*)    All other components within normal limits  DIGOXIN LEVEL - Abnormal; Notable for the following:    Digoxin Level 0.6 (*)    All other components within normal limits  CBG MONITORING, ED - Abnormal; Notable for the following:    Glucose-Capillary 128 (*)    All other components within normal limits  URINALYSIS, ROUTINE W REFLEX MICROSCOPIC    EKG  EKG Interpretation  Date/Time:  Wednesday May 04 2017 10:00:21 EDT Ventricular Rate:  80 PR Interval:  198 QRS Duration: 150 QT Interval:  460 QTC Calculation: 530 R Axis:   -80 Text Interpretation:  Sinus rhythm with occasional Premature ventricular complexes Left axis deviation Non-specific intra-ventricular conduction block Abnormal ECG Confirmed by Tilden Fossa [phone]) on 05/04/2017 1:55:51 PM       Radiology Dg Chest 2 View  Result Date: 05/04/2017 CLINICAL DATA:  Hypertension.  Confusion. EXAM: CHEST  2 VIEW COMPARISON:   April 01, 2017 FINDINGS: There is no edema or consolidation. Heart is enlarged with pulmonary vascularity within normal limits. Patient is status post coronary artery bypass grafting. Pacemaker lead tip is attached to the right ventricle. No pneumothorax. There is aortic atherosclerosis. No adenopathy. There is no evident bone lesion. IMPRESSION: Cardiomegaly. Aortic atherosclerosis. Status post coronary artery bypass grafting. Pacemaker lead attached right ventricle. No edema or consolidation. Aortic Atherosclerosis (ICD10-I70.0). Electronically Signed   By: Bretta Bang III M.D.   On: 05/04/2017 15:39   Ct Head Wo Contrast  Result Date: 05/04/2017 CLINICAL DATA:  Altered level of consciousness EXAM: CT HEAD WITHOUT CONTRAST TECHNIQUE: Contiguous axial images were obtained from the base of the skull through the vertex without intravenous contrast. COMPARISON:  None available FINDINGS: Brain: Age related brain atrophy and minor chronic white matter microvascular ischemic changes throughout both cerebral hemispheres. No acute intracranial hemorrhage, mass lesion, new infarction, midline shift, herniation, hydrocephalus, or extra-axial fluid collection. No focal mass effect or edema. Cisterns are patent. Cerebellar atrophy as well. Vascular: Intracranial atherosclerosis noted.  No hyperdense vessel Skull: Normal. Negative for fracture or focal lesion. Sinuses/Orbits: Orbits are symmetric and unremarkable. Chronic left maxillary sinus disease with mucosal thickening and surrounding hyperostosis. Similar chronic opacification of the left ethmoid sinus. Other sinuses clear. Other: None. IMPRESSION: Age related brain atrophy and minor chronic white matter microvascular ischemic changes. No acute intracranial abnormality by noncontrast CT. Chronic left ethmoid and sphenoid sinus disease. Electronically Signed   By: Judie Petit.  Shick M.D.   On: 05/04/2017 16:29    Procedures Procedures (including critical care  time)  Medications Ordered in ED Medications - No data to display   Initial Impression / Assessment and Plan / ED Course  I have reviewed the triage vital signs and the nursing notes.  Pertinent labs & imaging results that were available during my care of the patient were reviewed by me and considered in my medical decision making (see chart for details).     Patient here for evaluation of mild confusion, generalized weakness. Patient feels well on evaluation in the emergency department. Family states that the confusion has been ongoing for the last few months and is waxing and waning. His blood pressure is around 100 in the emergency department and on  review from his prior cardiology visits this is near his baseline. Presentation is not consistent with acute CVA, acute CHF, sepsis. Discussed home care, outpatient follow up and return precautions.    Final Clinical Impressions(s) / ED Diagnoses   Final diagnoses:  Weakness    New Prescriptions New Prescriptions   No medications on file     Tilden Fossa, MD 05/04/17 [phone]

## 2017-05-12 ENCOUNTER — Telehealth: Payer: Self-pay | Admitting: *Deleted

## 2017-05-12 DIAGNOSIS — I38 Endocarditis, valve unspecified: Secondary | ICD-10-CM

## 2017-05-12 DIAGNOSIS — I5022 Chronic systolic (congestive) heart failure: Principal | ICD-10-CM

## 2017-05-12 NOTE — Telephone Encounter (Signed)
Spoke with pt, we received paperwork from [facility] and based on those results they wanted to make changes in his medications. The patient reports he has not been called by [facility] and does not know about any medication change. Per dr [name], ? What is the current dose of torsemide and potassium. Patient reports his son takes care of his medication and will have him return the call.

## 2017-05-12 NOTE — Telephone Encounter (Signed)
Spoke with pt son, medications confirmed. Per dr [name], No change in medications at this time. Patient will need a BMP in one week. Lab orders mailed to the pt

## 2017-05-20 LAB — BASIC METABOLIC PANEL
BUN/Creatinine Ratio: 26 — ABNORMAL HIGH (ref 10–24)
BUN: 45 mg/dL — ABNORMAL HIGH (ref 8–27)
CALCIUM: 9.1 mg/dL (ref 8.6–10.2)
CHLORIDE: 86 mmol/L — AB (ref 96–106)
CO2: 24 mmol/L (ref 20–29)
Creatinine, Ser: 1.72 mg/dL — ABNORMAL HIGH (ref 0.76–1.27)
GFR calc Af Amer: 42 mL/min/{1.73_m2} — ABNORMAL LOW (ref >59)
GFR calc non Af Amer: 37 mL/min/{1.73_m2} — ABNORMAL LOW (ref >59)
GLUCOSE: 298 mg/dL — AB (ref 65–99)
POTASSIUM: 4.8 mmol/L (ref 3.5–5.2)
SODIUM: 125 mmol/L — AB (ref 134–144)

## 2017-05-27 ENCOUNTER — Encounter (HOSPITAL_COMMUNITY): Payer: Self-pay | Admitting: Cardiology

## 2017-05-27 ENCOUNTER — Ambulatory Visit (HOSPITAL_COMMUNITY)
Admission: RE | Admit: 2017-05-27 | Discharge: 2017-05-27 | Disposition: A | Discharge status: Home / Self Care | Payer: Medicare Other | Source: Ambulatory Visit | Attending: Cardiology | Admitting: Cardiology

## 2017-05-27 VITALS — BP 96/62 | HR 76 | Wt 198.8 lb

## 2017-05-27 DIAGNOSIS — E039 Hypothyroidism, unspecified: Secondary | ICD-10-CM | POA: Diagnosis not present

## 2017-05-27 DIAGNOSIS — I130 Hypertensive heart and chronic kidney disease with heart failure and stage 1 through stage 4 chronic kidney disease, or unspecified chronic kidney disease: Secondary | ICD-10-CM | POA: Diagnosis not present

## 2017-05-27 DIAGNOSIS — R0600 Dyspnea, unspecified: Secondary | ICD-10-CM | POA: Diagnosis present

## 2017-05-27 DIAGNOSIS — Z79899 Other long term (current) drug therapy: Secondary | ICD-10-CM | POA: Diagnosis not present

## 2017-05-27 DIAGNOSIS — I2510 Atherosclerotic heart disease of native coronary artery without angina pectoris: Secondary | ICD-10-CM | POA: Insufficient documentation

## 2017-05-27 DIAGNOSIS — I4891 Unspecified atrial fibrillation: Secondary | ICD-10-CM | POA: Diagnosis not present

## 2017-05-27 DIAGNOSIS — Z7901 Long term (current) use of anticoagulants: Secondary | ICD-10-CM | POA: Diagnosis not present

## 2017-05-27 DIAGNOSIS — E1122 Type 2 diabetes mellitus with diabetic chronic kidney disease: Secondary | ICD-10-CM | POA: Insufficient documentation

## 2017-05-27 DIAGNOSIS — Z95810 Presence of automatic (implantable) cardiac defibrillator: Secondary | ICD-10-CM | POA: Diagnosis not present

## 2017-05-27 DIAGNOSIS — N183 Chronic kidney disease, stage 3 (moderate): Secondary | ICD-10-CM | POA: Insufficient documentation

## 2017-05-27 DIAGNOSIS — I255 Ischemic cardiomyopathy: Secondary | ICD-10-CM | POA: Diagnosis not present

## 2017-05-27 DIAGNOSIS — Z794 Long term (current) use of insulin: Secondary | ICD-10-CM | POA: Insufficient documentation

## 2017-05-27 DIAGNOSIS — Z951 Presence of aortocoronary bypass graft: Secondary | ICD-10-CM | POA: Insufficient documentation

## 2017-05-27 DIAGNOSIS — M069 Rheumatoid arthritis, unspecified: Secondary | ICD-10-CM | POA: Diagnosis not present

## 2017-05-27 DIAGNOSIS — M1710 Unilateral primary osteoarthritis, unspecified knee: Secondary | ICD-10-CM | POA: Insufficient documentation

## 2017-05-27 DIAGNOSIS — I5022 Chronic systolic (congestive) heart failure: Secondary | ICD-10-CM | POA: Diagnosis not present

## 2017-05-27 DIAGNOSIS — E785 Hyperlipidemia, unspecified: Secondary | ICD-10-CM | POA: Insufficient documentation

## 2017-05-27 DIAGNOSIS — I5042 Chronic combined systolic (congestive) and diastolic (congestive) heart failure: Secondary | ICD-10-CM | POA: Diagnosis not present

## 2017-05-27 DIAGNOSIS — I480 Paroxysmal atrial fibrillation: Secondary | ICD-10-CM | POA: Diagnosis not present

## 2017-05-27 LAB — COMPREHENSIVE METABOLIC PANEL
ALBUMIN: 4 g/dL (ref 3.5–5.0)
ALK PHOS: 107 U/L (ref 38–126)
ALT: 24 U/L (ref 17–63)
AST: 24 U/L (ref 15–41)
Anion gap: 12 (ref 5–15)
BUN: 58 mg/dL — ABNORMAL HIGH (ref 6–20)
CALCIUM: 9.4 mg/dL (ref 8.9–10.3)
CO2: 29 mmol/L (ref 22–32)
CREATININE: 2.03 mg/dL — AB (ref 0.61–1.24)
Chloride: 84 mmol/L — ABNORMAL LOW (ref 101–111)
GFR calc non Af Amer: 29 mL/min — ABNORMAL LOW (ref >60)
GFR, EST AFRICAN AMERICAN: 34 mL/min — AB (ref >60)
GLUCOSE: 260 mg/dL — AB (ref 65–99)
Potassium: 4.9 mmol/L (ref 3.5–5.1)
SODIUM: 125 mmol/L — AB (ref 135–145)
Total Bilirubin: 0.9 mg/dL (ref 0.3–1.2)
Total Protein: 7.5 g/dL (ref 6.5–8.1)

## 2017-05-27 LAB — DIGOXIN LEVEL: Digoxin Level: 1.6 ng/mL (ref 0.8–2.0)

## 2017-05-27 LAB — CBC
HCT: 30.7 % — ABNORMAL LOW (ref 39.0–52.0)
Hemoglobin: 10.7 g/dL — ABNORMAL LOW (ref 13.0–17.0)
MCH: 27.6 pg (ref 26.0–34.0)
MCHC: 34.9 g/dL (ref 30.0–36.0)
MCV: 79.1 fL (ref 78.0–100.0)
PLATELETS: 192 10*3/uL (ref 150–400)
RBC: 3.88 MIL/uL — ABNORMAL LOW (ref 4.22–5.81)
RDW: 13.8 % (ref 11.5–15.5)
WBC: 6.5 10*3/uL (ref 4.0–10.5)

## 2017-05-27 NOTE — Patient Instructions (Signed)
Labs today (will call for abnormal results, otherwise no news is good news)  [name] wants you to start wearing compression stockings.   Follow up in 2 Months

## 2017-05-29 NOTE — Progress Notes (Signed)
Advanced Heart Failure Clinic Note   Primary Care: Lucila Maine MD Primary Cardiologist: Dr. [name] HF: [name]   HPI:  [name] is a 82 y.o. male with CAD s/p CABG, hypertension, hyperlipidemia and chronic systolic and diastolic heart failure who returns for followup of dyspnea and CHF. .  Admitted in 1/18 with acute on chornic systolic HF complicated by cardiogenic shock.  Required support with both milrinone and norepinephrine. Developed transaminitis 2/2 shock liver.  He also developed atrial fibrillation with RVR requiring amiodarone to control.  Tolvaptan given for hyponatremia.  L/RHC as below. Echo 08/26/16 LVEF 10-15%, Severe LAE, RV reduced, Severe RAE, Trivial pericardial perfusion. He was diuresed and titrated off milrinone/norepinephrine.  \  He was admitted in 8/18 with acute on chronic systolic CHF.  Last echo in 8/18 with EF 35-40%, mildly decreased RV systolic function.   At baseline, he is limited to a motorized scooter due to severe knee arthritis.  Does very little walking.  Currently, no orthopnea/PND.  He is not short of breath with transfers in and out of his scooter.  No chest pain.  No tachypalpitations.  He is in NSR today.  No BRBPR/melena.  Weight has been trending down. No lightheadedness.  He was recently started on Synthroid for hypothyroidism.   ECG (personally reviewed): NSR, 1st degree AVB, IVCD 150 msec  Labs 1/18: Hgb 11.8 => 10.2, digoxin 1.7 (dig decreased), Na 132, K 4.5, Creatinine 1.32 => 1.7 Labs 2/18: hgb 11.4, digoxin 1.2, TSH 11 but free T4 and free T3 normal, LFTs normal, K 4.4, creatinine 1.69 Labs 9/18: digoxin 0.6, free T4 0.78, TSH 49  Echo (8/18): EF 35-40%, dyskinesis of the mid-apical anteroseptum, mildly decreased RV systolic function.   RHC/LHC (09/01/16) Coronary Findings  Dominance: Right Left Main: 50% ostial stenosis LAD: Totally occluded proximal LAD after D1. Medium-sized D1 patent with 40-50% stenosis proximally.  LIMA-distal LAD patent. Proximal to the LIMA touchdown there are serial 80% stenoses in the mid LAD. Left Cx: LCx occluded proximally. OMs occluded proximally. Sequential SVG-OM1 and OM2 patent with good flow in target vessels. RCA: RCA occluded at the ostium. SVG-PDA patent, backfills only to distal RCA.  Right Heart  RHC Procedural Findings: Hemodynamics (mmHg) RA mean 5 RV 40/8 PA 40/18 mean 27 PCWP mean 19 LV 88/24 AO 86/52 Oxygen saturations: PA 69% AO 98% Cardiac Output (Fick) 6.43  Cardiac Index (Fick) 3.12 Cardiac Output (Thermo) 3.86 Cardiac Index (Thermo) 1.9    Past Medical History:  Diagnosis Date  . Congestive heart failure, unspecified   . Coronary atherosclerosis of unspecified type of vessel, native or graft   . ICD (implantable cardiac defibrillator), single BSX    DOI 2008  . Ischemic cardiomyopathy   . Other and unspecified hyperlipidemia   . Peptic ulcer, unspecified site, unspecified as acute or chronic, without mention of hemorrhage, perforation, or obstruction   . Rheumatoid arthritis(714.0)   . Type II or unspecified type diabetes mellitus without mention of complication, not stated as uncontrolled   . Unspecified essential hypertension   . Ventricular tachycardia (HCC)    Rx via ICD 5 /2012    Current Outpatient Prescriptions  Medication Sig Dispense Refill  . amiodarone (PACERONE) 200 MG tablet Take 1 tablet (200 mg total) by mouth daily. 90 tablet 3  . apixaban (ELIQUIS) 2.5 MG TABS tablet Take 1 tablet (2.5 mg total) by mouth 2 (two) times daily. 180 tablet 0  . atorvastatin (LIPITOR) 10 MG tablet Take 10  mg by mouth daily.      . carvedilol (COREG) 3.125 MG tablet Take 1 tablet (3.125 mg total) by mouth 2 (two) times daily with a meal. 90 tablet 3  . digoxin (LANOXIN) 0.125 MG tablet Take 0.0625 mg by mouth daily.     Marland Kitchen docusate sodium (COLACE) 100 MG capsule Take 100 mg by mouth as needed for mild constipation.    Marland Kitchen esomeprazole (NEXIUM)  40 MG capsule Take 40 mg by mouth daily.      . insulin glargine (LANTUS) 100 UNIT/ML injection Inject 10-40 Units into the skin 2 (two) times daily as needed (high blood sugar). Sliding scale.  If cbg= 110: none, cbg=200 take 25-30 units, cbg over 250 take 40 units    . levothyroxine (SYNTHROID) 50 MCG tablet Take 1 tablet (50 mcg total) by mouth daily before breakfast. 90 tablet 3  . losartan (COZAAR) 25 MG tablet Take 0.5 tablets (12.5 mg total) by mouth daily. 45 tablet 3  . polyethylene glycol (MIRALAX / GLYCOLAX) packet Take 17 g by mouth daily as needed for mild constipation.     . potassium chloride SA (K-DUR,KLOR-CON) 20 MEQ tablet Take 2 tablets (40 mEq total) by mouth daily. 30 tablet 6  . spironolactone (ALDACTONE) 25 MG tablet Take 25 mg by mouth daily.    Marland Kitchen torsemide (DEMADEX) 100 MG tablet Take 1 tablet (100 mg total) by mouth 2 (two) times daily. 180 tablet 3   No current facility-administered medications for this encounter.     No Known Allergies    Social History   Social History  . Marital status: Married    Spouse name: N/A  . Number of children: N/A  . Years of education: N/A   Occupational History  . Not on file.   Social History Main Topics  . Smoking status: Never Smoker  . Smokeless tobacco: Never Used  . Alcohol use No  . Drug use: No  . Sexual activity: Not on file   Other Topics Concern  . Not on file   Social History Narrative   Married. Has 1 surviving child and 4 surviving grandchildren.       Family History  Problem Relation Age of Onset  . Coronary artery disease Sister   . Coronary artery disease Brother     Vitals:   05/27/17 1131  BP: 96/62  Pulse: 76  SpO2: 96%  Weight: 198 lb 12.8 oz (90.2 kg)   Wt Readings from Last 3 Encounters:  05/27/17 198 lb 12.8 oz (90.2 kg)  04/22/17 207 lb (93.9 kg)  04/03/17 190 lb 1.6 oz (86.2 kg)    PHYSICAL EXAM: General: NAD, in motorized scooter Neck: No JVD, no thyromegaly or thyroid  nodule.  Lungs: Clear to auscultation bilaterally with normal respiratory effort. CV: Nondisplaced PMI.  Heart regular S1/S2, no S3/S4, no murmur.  1+ edema 3/4 up lower legs bilaterally.  No carotid bruit.  Difficult to palpate pedal pulses.  Abdomen: Soft, nontender, no hepatosplenomegaly, no distention.  Skin: Intact without lesions or rashes.  Neurologic: Alert and oriented x 3.  Psych: Normal affect. Extremities: No clubbing or cyanosis.  HEENT: Normal.    ASSESSMENT & PLAN:  1. Chronic systolic CHF:  Ischemic cardiomyopathy.  Echo 08/26/16 with LVEF 10-15%, severe biatrial enlargement, decreased RV systolic function.  Most recent echo in 8/18 with EF up to 35-40%.  [employer] ICD.  Admitted in 1/18 with cardiogenic shock, recovered.  He is minimally active at baseline due  to severe arthritis.  NYHA class II symptoms.  He has no JVD but does have peripheral edema which suggests a degree of venous insufficiency.  - Continue torsemide 100 mg bid.  BMET today.  - Continue losartan 12.5 mg daily and spironolactone 25 mg daily, no BP room to titrate.   - Continue digoxin 0.0625 daily and check level.   - Continue Coreg 3.125 mg bid.  - I will have him wear graded compression stockings during the day.  - CRT would be a consideration with wide IVCD, but EF appears to be just above range for this.  Will reassess after next echo.  2. CKD stage III: BMET today.  3. CAD: s/p CABG.  Coronary angiography 1/18 with patent graft and no targets for revascularization.  No recent chest pain.   - Continue ASA and statin.  4. Atrial fibrillation:  NSR today.  No tachypalpitations.  - Continue amiodarone 200 mg daily.  LFTs today.  He is on Synthroid for hypothyroidism.  He will need a regular eye exam.  - Continue Eliquis 2.5 mg BID (age, elevated creatinine).   5. Osteoarthritis: Knee OA significantly limits activity.   Followup in 2 months   Marca Ancona, MD 05/29/17

## 2017-06-07 ENCOUNTER — Encounter (HOSPITAL_COMMUNITY): Payer: Self-pay | Admitting: *Deleted

## 2017-06-09 ENCOUNTER — Telehealth (HOSPITAL_COMMUNITY): Payer: Self-pay | Admitting: *Deleted

## 2017-06-09 ENCOUNTER — Encounter (HOSPITAL_COMMUNITY): Payer: Self-pay | Admitting: *Deleted

## 2017-06-09 MED ORDER — DIGOXIN 125 MCG PO TABS: 0.0625 mg | ORAL_TABLET | ORAL | 3 refills | Status: AC

## 2017-06-09 MED ORDER — POTASSIUM CHLORIDE CRYS ER 20 MEQ PO TBCR: 20.0000 meq | EXTENDED_RELEASE_TABLET | Freq: Every day | ORAL | 6 refills | Status: DC

## 2017-06-09 NOTE — Telephone Encounter (Signed)
-----   Message from Laurey Morale, MD sent at 05/27/2017  3:47 PM EDT ----- Decrease digoxin to every other day and repeat digoxin level early next week as a trough.  Decrease KCl to 20 daily.

## 2017-06-17 ENCOUNTER — Other Ambulatory Visit: Payer: Self-pay | Admitting: Cardiology

## 2017-06-17 ENCOUNTER — Other Ambulatory Visit (HOSPITAL_COMMUNITY): Payer: Self-pay | Admitting: *Deleted

## 2017-06-17 DIAGNOSIS — I5022 Chronic systolic (congestive) heart failure: Secondary | ICD-10-CM

## 2017-06-19 LAB — DIGOXIN, RANDOM, SERUM: Digoxin, Random, Serum: 0.9 ng/mL

## 2017-06-19 LAB — DIGOXIN LEVEL: Digoxin, Serum: 0.9 ng/mL (ref 0.5–0.9)

## 2017-06-20 ENCOUNTER — Other Ambulatory Visit (HOSPITAL_COMMUNITY): Payer: Self-pay | Admitting: *Deleted

## 2017-06-20 MED ORDER — APIXABAN 2.5 MG PO TABS: 2.5000 mg | ORAL_TABLET | Freq: Two times a day (BID) | ORAL | 3 refills | Status: AC

## 2017-07-12 ENCOUNTER — Other Ambulatory Visit: Payer: Self-pay | Admitting: Cardiology

## 2017-07-12 DIAGNOSIS — I509 Heart failure, unspecified: Secondary | ICD-10-CM

## 2017-07-12 NOTE — Telephone Encounter (Signed)
REFILL 

## 2017-07-19 ENCOUNTER — Encounter: Payer: Medicare Other | Admitting: *Deleted

## 2017-07-19 ENCOUNTER — Emergency Department (HOSPITAL_COMMUNITY): Payer: Medicare Other

## 2017-07-19 ENCOUNTER — Telehealth: Payer: Self-pay | Admitting: Cardiology

## 2017-07-19 ENCOUNTER — Inpatient Hospital Stay (HOSPITAL_COMMUNITY)
Admission: EM | Admit: 2017-07-19 | Discharge: 2017-07-22 | DRG: 291 | Disposition: A | Discharge status: Home Health | Payer: Medicare Other | Attending: Internal Medicine | Admitting: Internal Medicine

## 2017-07-19 ENCOUNTER — Encounter (HOSPITAL_COMMUNITY): Payer: Self-pay | Admitting: Emergency Medicine

## 2017-07-19 DIAGNOSIS — Z888 Allergy status to other drugs, medicaments and biological substances status: Secondary | ICD-10-CM

## 2017-07-19 DIAGNOSIS — Z95810 Presence of automatic (implantable) cardiac defibrillator: Secondary | ICD-10-CM | POA: Diagnosis not present

## 2017-07-19 DIAGNOSIS — N183 Chronic kidney disease, stage 3 unspecified: Secondary | ICD-10-CM | POA: Diagnosis present

## 2017-07-19 DIAGNOSIS — I2510 Atherosclerotic heart disease of native coronary artery without angina pectoris: Secondary | ICD-10-CM | POA: Diagnosis present

## 2017-07-19 DIAGNOSIS — Z7901 Long term (current) use of anticoagulants: Secondary | ICD-10-CM

## 2017-07-19 DIAGNOSIS — E1122 Type 2 diabetes mellitus with diabetic chronic kidney disease: Secondary | ICD-10-CM | POA: Diagnosis present

## 2017-07-19 DIAGNOSIS — E038 Other specified hypothyroidism: Secondary | ICD-10-CM | POA: Diagnosis not present

## 2017-07-19 DIAGNOSIS — I700 Atherosclerosis of aorta: Secondary | ICD-10-CM | POA: Diagnosis present

## 2017-07-19 DIAGNOSIS — I509 Heart failure, unspecified: Secondary | ICD-10-CM | POA: Diagnosis not present

## 2017-07-19 DIAGNOSIS — E1165 Type 2 diabetes mellitus with hyperglycemia: Secondary | ICD-10-CM | POA: Diagnosis present

## 2017-07-19 DIAGNOSIS — E1129 Type 2 diabetes mellitus with other diabetic kidney complication: Secondary | ICD-10-CM | POA: Diagnosis not present

## 2017-07-19 DIAGNOSIS — E039 Hypothyroidism, unspecified: Secondary | ICD-10-CM | POA: Diagnosis present

## 2017-07-19 DIAGNOSIS — N184 Chronic kidney disease, stage 4 (severe): Secondary | ICD-10-CM | POA: Diagnosis present

## 2017-07-19 DIAGNOSIS — I5043 Acute on chronic combined systolic (congestive) and diastolic (congestive) heart failure: Secondary | ICD-10-CM | POA: Diagnosis present

## 2017-07-19 DIAGNOSIS — E785 Hyperlipidemia, unspecified: Secondary | ICD-10-CM | POA: Diagnosis present

## 2017-07-19 DIAGNOSIS — I5023 Acute on chronic systolic (congestive) heart failure: Secondary | ICD-10-CM | POA: Diagnosis present

## 2017-07-19 DIAGNOSIS — Z794 Long term (current) use of insulin: Secondary | ICD-10-CM | POA: Diagnosis not present

## 2017-07-19 DIAGNOSIS — I10 Essential (primary) hypertension: Secondary | ICD-10-CM | POA: Diagnosis not present

## 2017-07-19 DIAGNOSIS — E871 Hypo-osmolality and hyponatremia: Secondary | ICD-10-CM | POA: Diagnosis present

## 2017-07-19 DIAGNOSIS — Z951 Presence of aortocoronary bypass graft: Secondary | ICD-10-CM | POA: Diagnosis not present

## 2017-07-19 DIAGNOSIS — Z993 Dependence on wheelchair: Secondary | ICD-10-CM | POA: Diagnosis not present

## 2017-07-19 DIAGNOSIS — I255 Ischemic cardiomyopathy: Secondary | ICD-10-CM | POA: Diagnosis present

## 2017-07-19 DIAGNOSIS — K219 Gastro-esophageal reflux disease without esophagitis: Secondary | ICD-10-CM | POA: Diagnosis present

## 2017-07-19 DIAGNOSIS — I480 Paroxysmal atrial fibrillation: Secondary | ICD-10-CM | POA: Diagnosis present

## 2017-07-19 DIAGNOSIS — Z79899 Other long term (current) drug therapy: Secondary | ICD-10-CM

## 2017-07-19 DIAGNOSIS — M069 Rheumatoid arthritis, unspecified: Secondary | ICD-10-CM | POA: Diagnosis present

## 2017-07-19 DIAGNOSIS — I130 Hypertensive heart and chronic kidney disease with heart failure and stage 1 through stage 4 chronic kidney disease, or unspecified chronic kidney disease: Secondary | ICD-10-CM | POA: Diagnosis present

## 2017-07-19 HISTORY — DX: Acute myocardial infarction, unspecified: I21.9

## 2017-07-19 HISTORY — DX: Hypothyroidism, unspecified: E03.9

## 2017-07-19 HISTORY — DX: Unspecified osteoarthritis, unspecified site: M19.90

## 2017-07-19 HISTORY — DX: Rheumatoid arthritis, unspecified: M06.9

## 2017-07-19 HISTORY — DX: Presence of automatic (implantable) cardiac defibrillator: Z95.810

## 2017-07-19 HISTORY — DX: Type 2 diabetes mellitus without complications: E11.9

## 2017-07-19 LAB — BASIC METABOLIC PANEL
Anion gap: 13 (ref 5–15)
BUN: 44 mg/dL — ABNORMAL HIGH (ref 6–20)
CALCIUM: 8.2 mg/dL — AB (ref 8.9–10.3)
CHLORIDE: 92 mmol/L — AB (ref 101–111)
CO2: 21 mmol/L — AB (ref 22–32)
Creatinine, Ser: 2.13 mg/dL — ABNORMAL HIGH (ref 0.61–1.24)
GFR calc non Af Amer: 28 mL/min — ABNORMAL LOW (ref >60)
GFR, EST AFRICAN AMERICAN: 32 mL/min — AB (ref >60)
GLUCOSE: 220 mg/dL — AB (ref 65–99)
Potassium: 4 mmol/L (ref 3.5–5.1)
Sodium: 126 mmol/L — ABNORMAL LOW (ref 135–145)

## 2017-07-19 LAB — CBC
HCT: 27.1 % — ABNORMAL LOW (ref 39.0–52.0)
HEMOGLOBIN: 8.9 g/dL — AB (ref 13.0–17.0)
MCH: 27.2 pg (ref 26.0–34.0)
MCHC: 32.8 g/dL (ref 30.0–36.0)
MCV: 82.9 fL (ref 78.0–100.0)
Platelets: 183 10*3/uL (ref 150–400)
RBC: 3.27 MIL/uL — AB (ref 4.22–5.81)
RDW: 16 % — ABNORMAL HIGH (ref 11.5–15.5)
WBC: 5.4 10*3/uL (ref 4.0–10.5)

## 2017-07-19 LAB — I-STAT TROPONIN, ED: TROPONIN I, POC: 0.05 ng/mL (ref 0.00–0.08)

## 2017-07-19 LAB — TSH: TSH: 22.781 u[IU]/mL — AB (ref 0.350–4.500)

## 2017-07-19 LAB — TROPONIN I: Troponin I: 0.04 ng/mL (ref <0.03)

## 2017-07-19 LAB — BRAIN NATRIURETIC PEPTIDE: B NATRIURETIC PEPTIDE 5: 926.3 pg/mL — AB (ref 0.0–100.0)

## 2017-07-19 LAB — DIGOXIN LEVEL: DIGOXIN LVL: 0.5 ng/mL — AB (ref 0.8–2.0)

## 2017-07-19 MED ORDER — AMIODARONE HCL 200 MG PO TABS
200.0000 mg | ORAL_TABLET | Freq: Every morning | ORAL | Status: DC
  Administered 2017-07-20: 200 mg via ORAL
  Filled 2017-07-19: qty 1

## 2017-07-19 MED ORDER — SODIUM CHLORIDE 0.9 % IV SOLN: 250.0000 mL | INTRAVENOUS | Status: DC | PRN

## 2017-07-19 MED ORDER — ATORVASTATIN CALCIUM 10 MG PO TABS
10.0000 mg | ORAL_TABLET | Freq: Every day | ORAL | Status: DC
  Administered 2017-07-19 – 2017-07-21 (×3): 10 mg via ORAL
  Filled 2017-07-19 (×4): qty 1

## 2017-07-19 MED ORDER — ACETAMINOPHEN 325 MG PO TABS: 650.0000 mg | ORAL_TABLET | ORAL | Status: DC | PRN

## 2017-07-19 MED ORDER — APIXABAN 2.5 MG PO TABS
2.5000 mg | ORAL_TABLET | Freq: Two times a day (BID) | ORAL | Status: DC
  Administered 2017-07-19 – 2017-07-20 (×2): 2.5 mg via ORAL
  Filled 2017-07-19 (×3): qty 1

## 2017-07-19 MED ORDER — LEVOTHYROXINE SODIUM 50 MCG PO TABS
50.0000 ug | ORAL_TABLET | Freq: Every day | ORAL | Status: DC
  Administered 2017-07-20 – 2017-07-21 (×2): 50 ug via ORAL
  Filled 2017-07-19 (×2): qty 1

## 2017-07-19 MED ORDER — CARVEDILOL 3.125 MG PO TABS
3.1250 mg | ORAL_TABLET | Freq: Two times a day (BID) | ORAL | Status: DC
  Administered 2017-07-20: 3.125 mg via ORAL
  Filled 2017-07-19 (×2): qty 1

## 2017-07-19 MED ORDER — FUROSEMIDE 10 MG/ML IJ SOLN
60.0000 mg | Freq: Two times a day (BID) | INTRAMUSCULAR | Status: DC
  Administered 2017-07-19: 20 mg via INTRAVENOUS
  Administered 2017-07-20: 60 mg via INTRAVENOUS
  Filled 2017-07-19 (×2): qty 6

## 2017-07-19 MED ORDER — SPIRONOLACTONE 25 MG PO TABS
25.0000 mg | ORAL_TABLET | Freq: Every day | ORAL | Status: DC
  Administered 2017-07-19 – 2017-07-21 (×2): 25 mg via ORAL
  Filled 2017-07-19 (×3): qty 1

## 2017-07-19 MED ORDER — SODIUM CHLORIDE 0.9% FLUSH: 3.0000 mL | INTRAVENOUS | Status: DC | PRN

## 2017-07-19 MED ORDER — VITAMIN C 500 MG PO TABS
500.0000 mg | ORAL_TABLET | Freq: Two times a day (BID) | ORAL | Status: DC
  Administered 2017-07-20 – 2017-07-22 (×5): 500 mg via ORAL
  Filled 2017-07-19 (×5): qty 1

## 2017-07-19 MED ORDER — POTASSIUM CHLORIDE CRYS ER 20 MEQ PO TBCR
20.0000 meq | EXTENDED_RELEASE_TABLET | Freq: Every day | ORAL | Status: DC
  Administered 2017-07-19 – 2017-07-22 (×4): 20 meq via ORAL
  Filled 2017-07-19 (×4): qty 1

## 2017-07-19 MED ORDER — ONDANSETRON HCL 4 MG/2ML IJ SOLN: 4.0000 mg | Freq: Four times a day (QID) | INTRAMUSCULAR | Status: DC | PRN

## 2017-07-19 MED ORDER — BENZONATATE 100 MG PO CAPS: 100.0000 mg | ORAL_CAPSULE | Freq: Three times a day (TID) | ORAL | Status: DC | PRN

## 2017-07-19 MED ORDER — SODIUM CHLORIDE 0.9% FLUSH
3.0000 mL | Freq: Two times a day (BID) | INTRAVENOUS | Status: DC
  Administered 2017-07-20: 3 mL via INTRAVENOUS

## 2017-07-19 MED ORDER — PANTOPRAZOLE SODIUM 40 MG PO TBEC
40.0000 mg | DELAYED_RELEASE_TABLET | Freq: Every day | ORAL | Status: DC
  Administered 2017-07-19 – 2017-07-22 (×4): 40 mg via ORAL
  Filled 2017-07-19 (×4): qty 1

## 2017-07-19 MED ORDER — LOSARTAN POTASSIUM 25 MG PO TABS
12.5000 mg | ORAL_TABLET | Freq: Every day | ORAL | Status: DC
  Administered 2017-07-20 – 2017-07-22 (×2): 12.5 mg via ORAL
  Filled 2017-07-19 (×2): qty 1
  Filled 2017-07-19: qty 0.5

## 2017-07-19 MED ORDER — DIGOXIN 125 MCG PO TABS
0.0625 mg | ORAL_TABLET | ORAL | Status: DC
  Administered 2017-07-19 – 2017-07-21 (×2): 0.0625 mg via ORAL
  Filled 2017-07-19 (×2): qty 1

## 2017-07-19 NOTE — H&P (Signed)
TRH H&P   Patient Demographics:    [name], is a 82 y.o. male  MRN: [id number]   DOB - [birth year]  Admit Date - 07/19/2017  Outpatient Primary MD for the patient is Myer Peer, MD  Referring MD/NP/PA: Quintella Reichert  Outpatient Specialists:  Kirk Ruths  Patient coming from:  home  Chief Complaint  Patient presents with  . Leg Swelling    bilat legs, bilat hands,  . Hypotension      HPI:    [name]  is a 82 y.o. male, w Rheumatoid arthritis, PUD, Dm2, Hypertension, Hyperlipidemia, CHF(EF 10-15%), s/p ICD , CAD s/p CABG, apparently presents with c/o abdominal distention over the past few weeks as well as weight gain and pedal edema. Pt might be just slightly more dyspneic.  Pt denies fever, chills, cough, cp, palp, n/v, diarrhea, brbpr, black stool.  Pt presented to the ED for evaluation of possible CHF   In ED,  CXR IMPRESSION: 1. Cardiomegaly with slight interval increase in interstitial edema. 2. Aortic atherosclerosis. 3. ICD device in place.  Na 126, K4.0 Bun 44, Creatinine 2.13 Glucose 220 Wbc 5.4, Hgb 8.9, Pl t 183 BNP 926.3  Pt will be admitted for CHF, hyponatremia   Review of systems:    In addition to the HPI above,  No Fever-chills, No Headache, No changes with Vision or hearing, No problems swallowing food or Liquids, No Chest pain, Cough , Sob No Abdominal pain, No Nausea or Vommitting, Bowel movements are regular, No Blood in stool or Urine, No dysuria, No new skin rashes or bruises, No new joints pains-aches,  No new weakness, tingling, numbness in any extremity, + weight gain No polyuria, polydypsia or polyphagia, No significant Mental Stressors.  A full 10 point Review of Systems was done, except as stated above, all other Review of Systems were negative.   With Past History of the following :    Past Medical  History:  Diagnosis Date  . Congestive heart failure, unspecified   . Coronary atherosclerosis of unspecified type of vessel, native or graft   . ICD (implantable cardiac defibrillator), single BSX    DOI 2008  . Ischemic cardiomyopathy   . Other and unspecified hyperlipidemia   . Peptic ulcer, unspecified site, unspecified as acute or chronic, without mention of hemorrhage, perforation, or obstruction   . Rheumatoid arthritis(714.0)   . Type II or unspecified type diabetes mellitus without mention of complication, not stated as uncontrolled   . Unspecified essential hypertension   . Ventricular tachycardia ([place])    Rx via ICD 5 /2012      Past Surgical History:  Procedure Laterality Date  . CARDIAC CATHETERIZATION N/A 09/01/2016   Procedure: Right/Left Heart Cath and Coronary/Graft Angiography;  Surgeon: Larey Dresser, MD;  Location: [place] [facility];  Service: Cardiovascular;  Laterality: N/A;  . CARDIAC DEFIBRILLATOR PLACEMENT  status post implantable  . coronary artey bypass graft     status post  . decomppression (other)     left median nerve  . IMPLANTABLE CARDIOVERTER DEFIBRILLATOR (ICD) GENERATOR CHANGE N/A 01/25/2014   Procedure: ICD GENERATOR CHANGE;  Surgeon: Deboraha Sprang, MD;  Location: [facility] CATH LAB;  Service: Cardiovascular;  Laterality: N/A;      Social History:     Social History   Tobacco Use  . Smoking status: Never Smoker  . Smokeless tobacco: Never Used  Substance Use Topics  . Alcohol use: No     Lives - at home  Mobility - walks by self   Family History :     Family History  Problem Relation Age of Onset  . Coronary artery disease Sister   . Coronary artery disease Brother       Home Medications:   Prior to Admission medications   Medication Sig Start Date End Date Taking? Authorizing Provider  amiodarone (PACERONE) 200 MG tablet Take 1 tablet (200 mg total) by mouth daily. Patient taking differently: Take 200 mg by mouth every  morning.  09/27/16  Yes Bensimhon, Shaune Pascal, MD  apixaban (ELIQUIS) 2.5 MG TABS tablet Take 1 tablet (2.5 mg total) 2 (two) times daily by mouth. 06/20/17 09/18/17 Yes Larey Dresser, MD  atorvastatin (LIPITOR) 10 MG tablet Take 10 mg by mouth at bedtime.    Yes [provider]  benzonatate (TESSALON) 100 MG capsule Take 100 mg by mouth 3 (three) times daily as needed (for coughing).    Yes [provider]  carvedilol (COREG) 3.125 MG tablet Take 1 tablet (3.125 mg total) by mouth 2 (two) times daily with a meal. 12/31/16 12/26/17 Yes Tillery, Satira Mccallum, PA-C  digoxin (LANOXIN) 0.125 MG tablet Take 0.5 tablets (0.0625 mg total) by mouth every other day. 06/09/17  Yes Larey Dresser, MD  esomeprazole (NEXIUM) 40 MG capsule Take 40 mg by mouth daily.     Yes [provider]  glyBURIDE (DIABETA) 5 MG tablet Take 5 mg by mouth daily with breakfast.   Yes [provider]  HUMULIN R 100 UNIT/ML injection USE VIA PUMP AS DIRECTED WITH V-GO MACHINE AS DIRECTED. MAX DD OF 66 UNITS 07/04/17  Yes [provider]  Insulin Disposable Pump (V-GO 30) KIT daily.  07/04/17  Yes [provider]  levothyroxine (SYNTHROID) 50 MCG tablet Take 1 tablet (50 mcg total) by mouth daily before breakfast. 04/25/17  Yes Crenshaw, Denice Bors, MD  losartan (COZAAR) 25 MG tablet Take 0.5 tablets (12.5 mg total) by mouth daily. 11/19/16  Yes Shirley Friar, PA-C  polyethylene glycol [facility] / GLYCOLAX) packet Take 17 g by mouth daily as needed for mild constipation.    Yes [provider]  potassium chloride SA (K-DUR,KLOR-CON) 20 MEQ tablet Take 1 tablet (20 mEq total) by mouth daily. 06/09/17  Yes Larey Dresser, MD  spironolactone (ALDACTONE) 25 MG tablet Take 25 mg by mouth at bedtime.    Yes [provider]  torsemide (DEMADEX) 100 MG tablet Take 1 tablet (100 mg total) by mouth 2 (two) times daily. 05/02/17  Yes Minus Breeding, MD  vitamin C  (ASCORBIC ACID) 500 MG tablet Take 500 mg by mouth 2 (two) times daily.   Yes [provider]  lisinopril (PRINIVIL,ZESTRIL) 2.5 MG tablet TAKE 1 TABLET DAILY Patient not taking: Reported on 07/19/2017 07/12/17   Lelon Perla, MD     Allergies:  Allergies  Allergen Reactions  . Metformin And Related Other (See Comments)    "Made sugar go wild"   . Rivastigmine Nausea And Vomiting     Physical Exam:   Vitals  Blood pressure (!) 97/59, pulse 62, temperature 98.2 F (36.8 C), temperature source Oral, resp. rate 18, height '5\' 10"'$  (1.778 m), weight 94.3 kg (208 lb), SpO2 98 %.   1. General  lying in bed in NAD,    2. Normal affect and insight, Not Suicidal or Homicidal, Awake Alert, Oriented X 3.  3. No F.N deficits, ALL C.Nerves Intact, Strength 5/5 all 4 extremities, Sensation intact all 4 extremities, Plantars down going.  4. Ears and Eyes appear Normal, Conjunctivae clear, PERRLA. Moist Oral Mucosa.  5. Supple Neck, No JVD, No cervical lymphadenopathy appriciated, No Carotid Bruits.  6. Symmetrical Chest wall movement, Good air movement bilaterally,  Faint crackle bilateral base, no wheezing.   7. RRR, No Gallops, Rubs or Murmurs, No Parasternal Heave.  8. Minimal abdominal distention.  Positive Bowel Sounds, Abdomen Soft, No tenderness, No organomegaly appriciated,No rebound -guarding or rigidity.  9.  No Cyanosis, slight trace edema.   10. Good muscle tone,  joints appear normal , no effusions, Normal ROM.  11. No Palpable Lymph Nodes in Neck or Axillae     Data Review:    CBC Recent Labs  Lab 07/19/17 1819  WBC 5.4  HGB 8.9*  HCT 27.1*  PLT 183  MCV 82.9  MCH 27.2  MCHC 32.8  RDW 16.0*   ------------------------------------------------------------------------------------------------------------------  Chemistries  Recent Labs  Lab 07/19/17 1819  NA 126*  K 4.0  CL 92*  CO2 21*  GLUCOSE 220*  BUN 44*  CREATININE 2.13*    CALCIUM 8.2*   ------------------------------------------------------------------------------------------------------------------ estimated creatinine clearance is 31.9 mL/min (A) (by C-G formula based on SCr of 2.13 mg/dL (H)). ------------------------------------------------------------------------------------------------------------------ No results for input(s): TSH, T4TOTAL, T3FREE, THYROIDAB in the last 72 hours.  Invalid input(s): FREET3  Coagulation profile No results for input(s): INR, PROTIME in the last 168 hours. ------------------------------------------------------------------------------------------------------------------- No results for input(s): DDIMER in the last 72 hours. -------------------------------------------------------------------------------------------------------------------  Cardiac Enzymes No results for input(s): CKMB, TROPONINI, MYOGLOBIN in the last 168 hours.  Invalid input(s): CK ------------------------------------------------------------------------------------------------------------------    Component Value Date/Time   BNP 926.3 (H) 07/19/2017 1819   BNP 639.5 (H) 07/29/2016 1303     ---------------------------------------------------------------------------------------------------------------  Urinalysis    Component Value Date/Time   COLORURINE YELLOW 05/04/2017 1322   APPEARANCEUR CLEAR 05/04/2017 1322   LABSPEC 1.008 05/04/2017 1322   PHURINE 7.0 05/04/2017 1322   GLUCOSEU NEGATIVE 05/04/2017 1322   HGBUR NEGATIVE 05/04/2017 1322   BILIRUBINUR NEGATIVE 05/04/2017 1322   KETONESUR NEGATIVE 05/04/2017 1322   PROTEINUR NEGATIVE 05/04/2017 1322   NITRITE NEGATIVE 05/04/2017 1322   LEUKOCYTESUR NEGATIVE 05/04/2017 1322    ----------------------------------------------------------------------------------------------------------------   Imaging Results:    Dg Chest 2 View  Result Date: 07/19/2017 CLINICAL DATA:  82 year old  male presents with complaint of bilateral leg and hand swelling. Fluid buildup. EXAM: CHEST  2 VIEW COMPARISON:  05/04/2017 FINDINGS: Stable cardiomegaly with aortic atherosclerosis. The patient is status post CABG. ICD device projects over the left axilla with lead in the right ventricle. Mild interstitial edema is noted, slightly increased since prior. IMPRESSION: 1. Cardiomegaly with slight interval increase in interstitial edema. 2. Aortic atherosclerosis. 3. ICD device in place. Electronically Signed   By: Ashley Royalty M.D.   On: 07/19/2017 19:54   ekg nsr at 30, lad,  q in v1, poor R progression,    Assessment & Plan:    Active Problems:   Essential hypertension   Acute on chronic systolic CHF (congestive heart failure) (HCC)   CKD (chronic kidney disease), stage III (HCC)    Edema Lasix '80mg'$  iv bid Cont Spironolactone  CHF Tele Trop I q6h x3 Tsh Check cardiac  echo  CKD stage 3,  Check cmp in am  Hyponatremia Check serum osm, cortisol, tsh Check urine osm, urine sodium Check cmp in am  Pafib Cont amiodarone Cont digoxin Cont eliquis  Hypothyroidism Check tsh Cont levothyroxine  Gerd Cont PPI  Dm2 Hold glyburide fsbs ac and qhs ISS  DVT Prophylaxis eliquis  AM Labs Ordered, also please review Full Orders  Family Communication: Admission, patients condition and plan of care including tests being ordered have been discussed with the patient  who indicate understanding and agree with the plan and Code Status.  Code Status FULL CODE  Likely DC to  home  Condition GUARDED    Consults called: cardiology by email  Admission status: inpatient  Time spent in minutes : 45   Jani Gravel M.D on 07/19/2017 at 8:44 PM  Between 7pm to 7am - Pager - [phone]   After 7am go to [email or web address] - password [facility]  [facility] - Office  [phone]

## 2017-07-19 NOTE — ED Notes (Addendum)
Pt placed in hospital bed for comfort.  Pt and family updated on bed status and will be holding in ED

## 2017-07-19 NOTE — ED Triage Notes (Signed)
Pt presents from [facility] for increased fluid, takes torsemide and not urinating as much as normal, bilat leg swelling, bilat hand swelling with hx of CHF

## 2017-07-19 NOTE — ED Notes (Signed)
Pt noted to be hypotensive in triage, charge RN informed

## 2017-07-19 NOTE — Progress Notes (Signed)
ANTICOAGULATION CONSULT NOTE - Initial Consult  Pharmacy Consult:  Eliquis Indication:  Nonvalvular Afib  Allergies  Allergen Reactions  . Metformin And Related Other (See Comments)    "Made sugar go wild"   . Rivastigmine Nausea And Vomiting    Patient Measurements: Height: 5\' 10"  (177.8 cm) Weight: 208 lb (94.3 kg) IBW/kg (Calculated) : 73  Vital Signs: Temp: 98.2 F (36.8 C) (12/04 1756) Temp Source: Oral (12/04 1756) BP: 100/58 (12/04 2100) Pulse Rate: 63 (12/04 2100)  Labs: Recent Labs    07/19/17 1819  HGB 8.9*  HCT 27.1*  PLT 183  CREATININE 2.13*    Estimated Creatinine Clearance: 31.9 mL/min (A) (by C-G formula based on SCr of 2.13 mg/dL (H)).   Medical History: Past Medical History:  Diagnosis Date  . Congestive heart failure, unspecified   . Coronary atherosclerosis of unspecified type of vessel, native or graft   . ICD (implantable cardiac defibrillator), single BSX    DOI 2008  . Ischemic cardiomyopathy   . Other and unspecified hyperlipidemia   . Peptic ulcer, unspecified site, unspecified as acute or chronic, without mention of hemorrhage, perforation, or obstruction   . Rheumatoid arthritis(714.0)   . Type II or unspecified type diabetes mellitus without mention of complication, not stated as uncontrolled   . Unspecified essential hypertension   . Ventricular tachycardia (HCC)    Rx via ICD 5 /2012      Assessment: 82 YOM presented with leg swelling and hypotension.  Pharmacy consulted to continue Eliquis from PTA for history of nonvalvular AFib.  Based on patient's parameters, reduced dose is appropriate.  No bleeding reported.  Noted that patient took his morning dose later than usual.   Goal of Therapy:  Appropriate anticoagulation Monitor platelets by anticoagulation protocol: Yes    Plan:  Eliquis 2.5mg  PO BID Monitor renal fxn, s/sx of bleeding   Angelicia Lessner D. 09-22-1980, PharmD, BCPS Pager:  [phone] 07/19/2017, 10:04 PM

## 2017-07-19 NOTE — ED Provider Notes (Signed)
[facility] EMERGENCY DEPARTMENT Provider Note   CSN: [id number] Arrival date & time: 07/19/17  1741     History   Chief Complaint Chief Complaint  Patient presents with  . Leg Swelling    bilat legs, bilat hands,  . Hypotension    HPI JEMEL ONO is a 82 y.o. male.  The history is provided by the patient. No language interpreter was used.    ALIK MAWSON is a 82 y.o. male who presents to the Emergency Department complaining of swelling.  He reports 2-4 weeks of progressive swelling in bilateral lower extremities, abdomen, hands.  He has experienced 10 pound weight gain over the last 2 weeks.  He denies any chest pain or shortness of breath.  No nausea, vomiting, fevers, coughing.  He is taking torsemide twice daily as prescribed but is noticing decreased urine output.  He saw his family doctor today who recommended admission to the hospital.  Past Medical History:  Diagnosis Date  . Congestive heart failure, unspecified   . Coronary atherosclerosis of unspecified type of vessel, native or graft   . ICD (implantable cardiac defibrillator), single BSX    DOI 2008  . Ischemic cardiomyopathy   . Other and unspecified hyperlipidemia   . Peptic ulcer, unspecified site, unspecified as acute or chronic, without mention of hemorrhage, perforation, or obstruction   . Rheumatoid arthritis(714.0)   . Type II or unspecified type diabetes mellitus without mention of complication, not stated as uncontrolled   . Unspecified essential hypertension   . Ventricular tachycardia (HCC)    Rx via ICD 5 /2012    Patient Active Problem List   Diagnosis Date Noted  . Hyponatremia 04/02/2017  . Normocytic anemia 04/02/2017  . OA (osteoarthritis) 11/19/2016  . Paroxysmal atrial fibrillation (HCC) 09/10/2016  . CKD (chronic kidney disease), stage III (HCC) 09/10/2016  . Physical deconditioning 09/10/2016  . Single implantable cardioverter-defibrillator BSX    . Ventricular  tachycardia-treated the ATP 01/29/2011  . Hx of CABG 04/17/2009  . Cardiomyopathy, ischemic 04/17/2009  . Acute on chronic systolic CHF (congestive heart failure) (HCC) 04/17/2009  . Insulin dependent diabetes mellitus (HCC) 04/12/2009  . Hyperlipidemia LDL goal <70 04/12/2009  . Essential hypertension 04/12/2009  . PUD 04/12/2009  . Rheumatoid arthritis (HCC) 04/12/2009    Past Surgical History:  Procedure Laterality Date  . CARDIAC CATHETERIZATION N/A 09/01/2016   Procedure: Right/Left Heart Cath and Coronary/Graft Angiography;  Surgeon: Laurey Morale, MD;  Location: [facility] INVASIVE CV LAB;  Service: Cardiovascular;  Laterality: N/A;  . CARDIAC DEFIBRILLATOR PLACEMENT     status post implantable  . coronary artey bypass graft     status post  . decomppression (other)     left median nerve  . IMPLANTABLE CARDIOVERTER DEFIBRILLATOR (ICD) GENERATOR CHANGE N/A 01/25/2014   Procedure: ICD GENERATOR CHANGE;  Surgeon: Duke Salvia, MD;  Location: [facility] CATH LAB;  Service: Cardiovascular;  Laterality: N/A;       Home Medications    Prior to Admission medications   Medication Sig Start Date End Date Taking? Authorizing Provider  amiodarone (PACERONE) 200 MG tablet Take 1 tablet (200 mg total) by mouth daily. 09/27/16   Bensimhon, Bevelyn Buckles, MD  apixaban (ELIQUIS) 2.5 MG TABS tablet Take 1 tablet (2.5 mg total) 2 (two) times daily by mouth. 06/20/17 09/18/17  Laurey Morale, MD  atorvastatin (LIPITOR) 10 MG tablet Take 10 mg by mouth daily.      [provider]  carvedilol (COREG) 3.125 MG tablet Take 1 tablet (3.125 mg total) by mouth 2 (two) times daily with a meal. 12/31/16 12/26/17  Tillery, Mariam Dollar, PA-C  digoxin (LANOXIN) 0.125 MG tablet Take 0.5 tablets (0.0625 mg total) by mouth every other day. 06/09/17   Laurey Morale, MD  docusate sodium (COLACE) 100 MG capsule Take 100 mg by mouth as needed for mild constipation.    [provider]  esomeprazole (NEXIUM) 40  MG capsule Take 40 mg by mouth daily.      [provider]  insulin glargine (LANTUS) 100 UNIT/ML injection Inject 10-40 Units into the skin 2 (two) times daily as needed (high blood sugar). Sliding scale.  If cbg= 110: none, cbg=200 take 25-30 units, cbg over 250 take 40 units    [provider]  levothyroxine (SYNTHROID) 50 MCG tablet Take 1 tablet (50 mcg total) by mouth daily before breakfast. 04/25/17   Lewayne Bunting, MD  lisinopril (PRINIVIL,ZESTRIL) 2.5 MG tablet TAKE 1 TABLET DAILY 07/12/17   Lewayne Bunting, MD  losartan (COZAAR) 25 MG tablet Take 0.5 tablets (12.5 mg total) by mouth daily. 11/19/16   Graciella Freer, PA-C  polyethylene glycol (MIRALAX / Ethelene Hal) packet Take 17 g by mouth daily as needed for mild constipation.     [provider]  potassium chloride SA (K-DUR,KLOR-CON) 20 MEQ tablet Take 1 tablet (20 mEq total) by mouth daily. 06/09/17   Laurey Morale, MD  spironolactone (ALDACTONE) 25 MG tablet Take 25 mg by mouth daily.    [provider]  torsemide (DEMADEX) 100 MG tablet Take 1 tablet (100 mg total) by mouth 2 (two) times daily. 05/02/17   Rollene Rotunda, MD    Family History Family History  Problem Relation Age of Onset  . Coronary artery disease Sister   . Coronary artery disease Brother     Social History Social History   Tobacco Use  . Smoking status: Never Smoker  . Smokeless tobacco: Never Used  Substance Use Topics  . Alcohol use: No  . Drug use: No     Allergies   Patient has no known allergies.   Review of Systems Review of Systems  All other systems reviewed and are negative.    Physical Exam Updated Vital Signs BP (!) 88/47 (BP Location: Right Arm)   Pulse 67   Temp 98.2 F (36.8 C) (Oral)   Resp 18   Ht 5\' 10"  (1.778 m)   Wt 94.3 kg (208 lb)   SpO2 100%   BMI 29.84 kg/m   Physical Exam  Constitutional: He is oriented to person, place, and time. He appears well-developed  and well-nourished.  HENT:  Head: Normocephalic and atraumatic.  Cardiovascular: Normal rate and regular rhythm.  Murmur heard. Pulmonary/Chest: Effort normal. No respiratory distress.  Decreased air movement in the left lung base  Abdominal: Soft. There is no tenderness. There is no rebound and no guarding.  Musculoskeletal:  1+ DP pulses bilaterally.  There is 2-3+ pitting edema in bilateral lower extremities  Neurological: He is alert and oriented to person, place, and time.  Skin: Skin is warm and dry.  Psychiatric: He has a normal mood and affect. His behavior is normal.  Nursing note and vitals reviewed.    ED Treatments / Results  Labs (all labs ordered are listed, but only abnormal results are displayed) Labs Reviewed  BASIC METABOLIC PANEL  CBC  BRAIN NATRIURETIC PEPTIDE  DIGOXIN LEVEL  I-STAT TROPONIN, ED  EKG  EKG Interpretation  Date/Time:  Tuesday July 19 2017 17:51:37 EST Ventricular Rate:  70 PR Interval:  196 QRS Duration: 146 QT Interval:  494 QTC Calculation: 533 R Axis:   -130 Text Interpretation:  Sinus rhythm with occasional Premature ventricular complexes Right superior axis deviation Non-specific intra-ventricular conduction block Abnormal ECG Confirmed by Tilden Fossa [phone]) on 07/19/2017 6:23:48 PM       Radiology No results found.  Procedures Procedures (including critical care time)  Medications Ordered in ED Medications - No data to display   Initial Impression / Assessment and Plan / ED Course  I have reviewed the triage vital signs and the nursing notes.  Pertinent labs & imaging results that were available during my care of the patient were reviewed by me and considered in my medical decision making (see chart for details).     Patient with history of CHF here with progressive weight gain and lower extremity/abdominal edema.  He is in no acute distress in the emergency department with good air movement bilaterally.   He does have clinical evidence of volume overload with lower extremity edema as well as abdominal wall edema.  Labs demonstrate stable hyponatremia and chronic kidney disease.  Medicine consulted for further treatment of CHF with anasarca.  Final Clinical Impressions(s) / ED Diagnoses   Final diagnoses:  None    ED Discharge Orders    None       Tilden Fossa, MD 07/19/17 2354

## 2017-07-19 NOTE — Telephone Encounter (Signed)
Spoke with pt and reminded pt of remote transmission that is due today. Pt verbalized understanding.   

## 2017-07-20 ENCOUNTER — Other Ambulatory Visit: Payer: Self-pay

## 2017-07-20 ENCOUNTER — Inpatient Hospital Stay (HOSPITAL_COMMUNITY): Payer: Medicare Other

## 2017-07-20 ENCOUNTER — Encounter (HOSPITAL_COMMUNITY): Payer: Self-pay | Admitting: General Practice

## 2017-07-20 DIAGNOSIS — E1129 Type 2 diabetes mellitus with other diabetic kidney complication: Secondary | ICD-10-CM

## 2017-07-20 DIAGNOSIS — E039 Hypothyroidism, unspecified: Secondary | ICD-10-CM

## 2017-07-20 DIAGNOSIS — E871 Hypo-osmolality and hyponatremia: Secondary | ICD-10-CM

## 2017-07-20 DIAGNOSIS — E1165 Type 2 diabetes mellitus with hyperglycemia: Secondary | ICD-10-CM

## 2017-07-20 DIAGNOSIS — I480 Paroxysmal atrial fibrillation: Secondary | ICD-10-CM

## 2017-07-20 DIAGNOSIS — I509 Heart failure, unspecified: Secondary | ICD-10-CM

## 2017-07-20 DIAGNOSIS — N184 Chronic kidney disease, stage 4 (severe): Secondary | ICD-10-CM

## 2017-07-20 LAB — ECHOCARDIOGRAM COMPLETE
HEIGHTINCHES: 70 in
Weight: 3328 oz

## 2017-07-20 LAB — COMPREHENSIVE METABOLIC PANEL
ALBUMIN: 3.2 g/dL — AB (ref 3.5–5.0)
ALT: 24 U/L (ref 17–63)
AST: 22 U/L (ref 15–41)
Alkaline Phosphatase: 96 U/L (ref 38–126)
Anion gap: 14 (ref 5–15)
BUN: 41 mg/dL — AB (ref 6–20)
CHLORIDE: 91 mmol/L — AB (ref 101–111)
CO2: 24 mmol/L (ref 22–32)
CREATININE: 2.06 mg/dL — AB (ref 0.61–1.24)
Calcium: 8.2 mg/dL — ABNORMAL LOW (ref 8.9–10.3)
GFR calc Af Amer: 33 mL/min — ABNORMAL LOW (ref >60)
GFR calc non Af Amer: 29 mL/min — ABNORMAL LOW (ref >60)
GLUCOSE: 195 mg/dL — AB (ref 65–99)
POTASSIUM: 4 mmol/L (ref 3.5–5.1)
Sodium: 129 mmol/L — ABNORMAL LOW (ref 135–145)
Total Bilirubin: 0.8 mg/dL (ref 0.3–1.2)
Total Protein: 6.3 g/dL — ABNORMAL LOW (ref 6.5–8.1)

## 2017-07-20 LAB — CBC WITH DIFFERENTIAL/PLATELET
Basophils Absolute: 0 10*3/uL (ref 0.0–0.1)
Basophils Relative: 0 %
EOS PCT: 1 %
Eosinophils Absolute: 0 10*3/uL (ref 0.0–0.7)
HCT: 24.8 % — ABNORMAL LOW (ref 39.0–52.0)
Hemoglobin: 8.1 g/dL — ABNORMAL LOW (ref 13.0–17.0)
LYMPHS ABS: 0.7 10*3/uL (ref 0.7–4.0)
LYMPHS PCT: 22 %
MCH: 27.3 pg (ref 26.0–34.0)
MCHC: 32.7 g/dL (ref 30.0–36.0)
MCV: 83.5 fL (ref 78.0–100.0)
MONO ABS: 0.3 10*3/uL (ref 0.1–1.0)
MONOS PCT: 10 %
Neutro Abs: 2.1 10*3/uL (ref 1.7–7.7)
Neutrophils Relative %: 67 %
PLATELETS: 158 10*3/uL (ref 150–400)
RBC: 2.97 MIL/uL — AB (ref 4.22–5.81)
RDW: 16.2 % — ABNORMAL HIGH (ref 11.5–15.5)
WBC: 3.2 10*3/uL — ABNORMAL LOW (ref 4.0–10.5)

## 2017-07-20 LAB — TROPONIN I
Troponin I: 0.04 ng/mL (ref <0.03)
Troponin I: 0.04 ng/mL (ref <0.03)

## 2017-07-20 LAB — GLUCOSE, CAPILLARY
GLUCOSE-CAPILLARY: 113 mg/dL — AB (ref 65–99)
GLUCOSE-CAPILLARY: 163 mg/dL — AB (ref 65–99)

## 2017-07-20 MED ORDER — SODIUM CHLORIDE 0.9 % IV SOLN: 250.0000 mL | INTRAVENOUS | Status: DC | PRN

## 2017-07-20 MED ORDER — APIXABAN 2.5 MG PO TABS
2.5000 mg | ORAL_TABLET | Freq: Two times a day (BID) | ORAL | Status: DC
  Administered 2017-07-20 – 2017-07-22 (×4): 2.5 mg via ORAL
  Filled 2017-07-20 (×4): qty 1

## 2017-07-20 MED ORDER — ACETAMINOPHEN 325 MG PO TABS: 650.0000 mg | ORAL_TABLET | ORAL | Status: DC | PRN

## 2017-07-20 MED ORDER — AMIODARONE HCL 200 MG PO TABS: 200.0000 mg | ORAL_TABLET | Freq: Every day | ORAL | Status: DC

## 2017-07-20 MED ORDER — SPIRONOLACTONE 25 MG PO TABS: 25.0000 mg | ORAL_TABLET | Freq: Every day | ORAL | Status: DC

## 2017-07-20 MED ORDER — INSULIN ASPART 100 UNIT/ML ~~LOC~~ SOLN
0.0000 [IU] | Freq: Three times a day (TID) | SUBCUTANEOUS | Status: DC
  Administered 2017-07-21 – 2017-07-22 (×4): 2 [IU] via SUBCUTANEOUS
  Administered 2017-07-22: 3 [IU] via SUBCUTANEOUS

## 2017-07-20 MED ORDER — LOSARTAN POTASSIUM 25 MG PO TABS: 12.5000 mg | ORAL_TABLET | Freq: Every day | ORAL | Status: DC

## 2017-07-20 MED ORDER — ONDANSETRON HCL 4 MG/2ML IJ SOLN: 4.0000 mg | Freq: Four times a day (QID) | INTRAMUSCULAR | Status: DC | PRN

## 2017-07-20 MED ORDER — INSULIN ASPART 100 UNIT/ML ~~LOC~~ SOLN: 0.0000 [IU] | Freq: Every day | SUBCUTANEOUS | Status: DC

## 2017-07-20 MED ORDER — INSULIN ASPART 100 UNIT/ML ~~LOC~~ SOLN: 0.0000 [IU] | SUBCUTANEOUS | Status: DC

## 2017-07-20 MED ORDER — INSULIN ASPART 100 UNIT/ML ~~LOC~~ SOLN: 0.0000 [IU] | Freq: Three times a day (TID) | SUBCUTANEOUS | Status: DC

## 2017-07-20 MED ORDER — SODIUM CHLORIDE 0.9% FLUSH
3.0000 mL | Freq: Two times a day (BID) | INTRAVENOUS | Status: DC
  Administered 2017-07-21 – 2017-07-22 (×3): 3 mL via INTRAVENOUS

## 2017-07-20 MED ORDER — FUROSEMIDE 10 MG/ML IJ SOLN: 60.0000 mg | Freq: Two times a day (BID) | INTRAMUSCULAR | Status: DC

## 2017-07-20 MED ORDER — SODIUM CHLORIDE 0.9% FLUSH: 3.0000 mL | INTRAVENOUS | Status: DC | PRN

## 2017-07-20 MED ORDER — FUROSEMIDE 10 MG/ML IJ SOLN
60.0000 mg | Freq: Three times a day (TID) | INTRAMUSCULAR | Status: DC
  Administered 2017-07-21 – 2017-07-22 (×4): 60 mg via INTRAVENOUS
  Filled 2017-07-20 (×4): qty 6

## 2017-07-20 MED ORDER — DIGOXIN 125 MCG PO TABS: 0.0625 mg | ORAL_TABLET | ORAL | Status: DC

## 2017-07-20 NOTE — ED Notes (Signed)
Lunch tray given. 

## 2017-07-20 NOTE — ED Notes (Signed)
Patient resting on hospital bed no complaints . States he feels good enough to go home.

## 2017-07-20 NOTE — Progress Notes (Signed)
Call back received from Dr Selena Batten. MD informed of patient BP 84/45 and patient due to get scheduled lasix 60 mg IV and Aldactone 25 mg PO tonight. Informed by MD not to give scheduled Laxis and  Aldactone tonight.

## 2017-07-20 NOTE — ED Notes (Signed)
Heart Healthy diet lunch tray ordered @ 1137.  

## 2017-07-20 NOTE — ED Notes (Signed)
Lunch tray given appetite good.  

## 2017-07-20 NOTE — ED Notes (Signed)
Breakfast Tray given  

## 2017-07-20 NOTE — ED Notes (Signed)
Heart Healthy/Carb Modified diet breakfast tray ordered @ 0845.  

## 2017-07-20 NOTE — Progress Notes (Signed)
PROGRESS NOTE    [name]  HRC:[id number] DOB: [birth year] DOA: 07/19/2017 PCP: [name], MD   Brief Narrative:  [name]  is a 82 y.o. WM PMHx Paroxysmal Atrial Fibrillation, chronic Systolic CHF (EF 68-03% on last echocardiogram 04/02/2017), Ischemic Cardiomyopathy: CAD, S/P ICD 2008, Essential HTN, S/P CABG, HLD, Diabetes  Type 2  Uncontrolled with Complications, PUD.  c/o abdominal distention over the past few weeks as well as weight gain and pedal edema. Pt might be just slightly more dyspneic.  Pt denies fever, chills, cough, cp, palp, n/v, diarrhea, brbpr, black stool.  Pt presented to the ED for evaluation of possible CHF     Subjective: 12/5  A/Ox 4 , negative CP, negative SSB, negative abdominal pain, negative N/V.  States is pretty much wheelchair-bound therefore does not weigh himself daily.    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 -8/18 Hemoglobin A1c= 7.1 -Sensitive SSI   Acute on chronic Systolic CHF  -per patient base weight 89.8 kg ( 198 pounds) -Strict in and out since admission -2.3 L -Daily weight Filed Weights   07/19/17 1757  Weight: 208 lb (94.3 kg)  -Patient with significant fluid overload by exam, requires IV Lasix -Lasix IV 60 mg TID -Losartan 12.5 mg daily -Spironolactone 25 mg daily -Echocardiogram pending  Paroxysmal atrial fibrillation - Amiodarone 200 mg daily - Eliquis 2.5 mg BID - Digoxin 0.0625 mg QOD   CKD stage IV( baseline Cr ~ 2.0) -Currently at baseline -Monitor closely -Hold all nephrotoxic medications   Chronic Hyponatremia (baseline Na 128) -Most likely Secondary to CHF? -Check serum osm, cortisol, tsh -Check urine osm, urine sodium   Hypothyroidism unspecified site -TSH  pending -Synthroid 50 g daily        DVT prophylaxis: Eliquis Code Status: Full Family Communication: None Disposition Plan: Discharge next 24-48 are sent   Consultants:    Procedures/Significant Events:  8/18 Echocardiogram:Left ventricle: moderate LVH. LVEF =35% to 40%. Dyskinesis of the mid-apicalanteroseptal and apical myocardium. - Mitral valve: mild to moderate regurgitation. ------------------------------------------------------------------------------------------------------------------------------------------------   I have personally reviewed and interpreted all radiology studies and my findings are as above.  VENTILATOR SETTINGS:    Cultures None  Antimicrobials: None   Devices None   LINES / TUBES:  None    Continuous Infusions: . sodium chloride       Objective: Vitals:   07/20/17 0500 07/20/17 0600 07/20/17 0700 07/20/17 0834  BP: 95/62 98/60 99/60  (!) 100/56  Pulse: 65  65 69  Resp: 19 20 16    Temp:      TempSrc:      SpO2: 99%  99% 100%  Weight:      Height:        Intake/Output Summary (Last 24 hours) at 07/20/2017 0919 Last data filed at 07/20/2017 14/12/2016 Gross per 24 hour  Intake -  Output 2375 ml  Net -2375 ml   Filed Weights   07/19/17 1757  Weight: 208 lb (94.3 kg)    Examination:  General: A/O x4, No acute respiratory distress Neck:  Negative scars, masses, torticollis, lymphadenopathy, JVD Lungs: Clear to auscultation bilaterally without wheezes or crackles Cardiovascular: Regular rate and rhythm without murmur gallop or rub normal S1 and S2 Abdomen: negative abdominal pain, nondistended,  positive soft, bowel sounds, no rebound, no ascites, no appreciable mass, positive anasarca Extremities: No significant cyanosis, clubbing.  Positive bilateral extremity edema to hips 2-3+  Skin: Negative rashes, lesions, ulcers Psychiatric:  Negative depression, negative anxiety, negative fatigue, negative mania  Central nervous  system:  Cranial nerves II through XII intact, tongue/uvula midline, bilateral upper extremity muscle strength 5/5, bilateral lower extremity strength 2-3/5, sensation intact throughout,  negative dysarthria, negative expressive aphasia, negative receptive aphasia.  .     Data Reviewed: Care during the described time interval was provided by me .  I have reviewed this patient's available data, including medical history, events of note, physical examination, and all test results as part of my evaluation.   CBC: Recent Labs  Lab 07/19/17 1819 07/20/17 0239  WBC 5.4 3.2*  NEUTROABS  --  2.1  HGB 8.9* 8.1*  HCT 27.1* 24.8*  MCV 82.9 83.5  PLT 183 158   Basic Metabolic Panel: Recent Labs  Lab 07/19/17 1819 07/20/17 0239  NA 126* 129*  K 4.0 4.0  CL 92* 91*  CO2 21* 24  GLUCOSE 220* 195*  BUN 44* 41*  CREATININE 2.13* 2.06*  CALCIUM 8.2* 8.2*   GFR: Estimated Creatinine Clearance: 33 mL/min (A) (by C-G formula based on SCr of 2.06 mg/dL (H)). Liver Function Tests: Recent Labs  Lab 07/20/17 0239  AST 22  ALT 24  ALKPHOS 96  BILITOT 0.8  PROT 6.3*  ALBUMIN 3.2*   No results for input(s): LIPASE, AMYLASE in the last 168 hours. No results for input(s): AMMONIA in the last 168 hours. Coagulation Profile: No results for input(s): INR, PROTIME in the last 168 hours. Cardiac Enzymes: Recent Labs  Lab 07/19/17 2154 07/20/17 0239  TROPONINI 0.04* 0.04*   BNP (last 3 results) No results for input(s): PROBNP in the last 8760 hours. HbA1C: No results for input(s): HGBA1C in the last 72 hours. CBG: No results for input(s): GLUCAP in the last 168 hours. Lipid Profile: No results for input(s): CHOL, HDL, LDLCALC, TRIG, CHOLHDL, LDLDIRECT in the last 72 hours. Thyroid Function Tests: Recent Labs    07/19/17 2154  TSH 22.781*   Anemia Panel: No results for input(s): VITAMINB12, FOLATE, FERRITIN, TIBC, IRON, RETICCTPCT in the last 72 hours. Urine analysis:      Component Value Date/Time   COLORURINE YELLOW 05/04/2017 1322   APPEARANCEUR CLEAR 05/04/2017 1322   LABSPEC 1.008 05/04/2017 1322   PHURINE 7.0 05/04/2017 1322   GLUCOSEU NEGATIVE 05/04/2017 1322   HGBUR NEGATIVE 05/04/2017 1322   BILIRUBINUR NEGATIVE 05/04/2017 1322   KETONESUR NEGATIVE 05/04/2017 1322   PROTEINUR NEGATIVE 05/04/2017 1322   NITRITE NEGATIVE 05/04/2017 1322   LEUKOCYTESUR NEGATIVE 05/04/2017 1322   Sepsis Labs: @LABRCNTIP (procalcitonin:4,lacticidven:4)  )No results found for this or any previous visit (from the past 240 hour(s)).       Radiology Studies: Dg Chest 2 View  Result Date: 07/19/2017 CLINICAL DATA:  82 year old male presents with complaint of bilateral leg and hand swelling. Fluid buildup. EXAM: CHEST  2 VIEW COMPARISON:  05/04/2017 FINDINGS: Stable cardiomegaly with aortic atherosclerosis. The patient is status post CABG. ICD device projects over the left axilla with lead in the right ventricle. Mild interstitial edema is noted, slightly increased since prior. IMPRESSION: 1. Cardiomegaly with slight interval increase in interstitial edema. 2. Aortic atherosclerosis. 3. ICD device in place. Electronically Signed   By: Tollie Eth M.D.   On: 07/19/2017 19:54        Scheduled Meds: .  amiodarone  200 mg Oral q morning - 10a  . apixaban  2.5 mg Oral BID  . atorvastatin  10 mg Oral QHS  . carvedilol  3.125 mg Oral BID WC  . digoxin  0.0625 mg Oral QODAY  . furosemide  60 mg Intravenous Q12H  . levothyroxine  50 mcg Oral QAC breakfast  . losartan  12.5 mg Oral Daily  . pantoprazole  40 mg Oral Daily  . potassium chloride SA  20 mEq Oral Daily  . sodium chloride flush  3 mL Intravenous Q12H  . spironolactone  25 mg Oral QHS  . vitamin C  500 mg Oral BID   Continuous Infusions: . sodium chloride       LOS: 1 day    Time spent: 40 minutes      Reuel Lamadrid, Roselind Messier, MD [facility] Pager [phone]   If 7PM-7AM, please contact  night-coverage [email or web address] Password [email or web address] 07/20/2017, 9:19 AM

## 2017-07-20 NOTE — Progress Notes (Signed)
  Echocardiogram 2D Echocardiogram has been performed.  [name] 07/20/2017, 12:12 PM

## 2017-07-20 NOTE — Progress Notes (Signed)
BP 84/45 and patient due to get lasix 60 mg IV and aldactone 25 mg PO tonight.  Paged Dr Selena Batten. Awaiting response

## 2017-07-21 ENCOUNTER — Encounter: Payer: Self-pay | Admitting: Cardiology

## 2017-07-21 DIAGNOSIS — I5043 Acute on chronic combined systolic (congestive) and diastolic (congestive) heart failure: Secondary | ICD-10-CM

## 2017-07-21 DIAGNOSIS — E038 Other specified hypothyroidism: Secondary | ICD-10-CM

## 2017-07-21 LAB — MAGNESIUM
Magnesium: 2.1 mg/dL (ref 1.7–2.4)
Magnesium: 2.1 mg/dL (ref 1.7–2.4)

## 2017-07-21 LAB — BASIC METABOLIC PANEL
Anion gap: 10 (ref 5–15)
BUN: 35 mg/dL — AB (ref 6–20)
CO2: 28 mmol/L (ref 22–32)
Calcium: 8.5 mg/dL — ABNORMAL LOW (ref 8.9–10.3)
Chloride: 91 mmol/L — ABNORMAL LOW (ref 101–111)
Creatinine, Ser: 1.98 mg/dL — ABNORMAL HIGH (ref 0.61–1.24)
GFR calc Af Amer: 35 mL/min — ABNORMAL LOW (ref >60)
GFR, EST NON AFRICAN AMERICAN: 30 mL/min — AB (ref >60)
GLUCOSE: 176 mg/dL — AB (ref 65–99)
POTASSIUM: 4 mmol/L (ref 3.5–5.1)
Sodium: 129 mmol/L — ABNORMAL LOW (ref 135–145)

## 2017-07-21 LAB — GLUCOSE, CAPILLARY
GLUCOSE-CAPILLARY: 191 mg/dL — AB (ref 65–99)
Glucose-Capillary: 158 mg/dL — ABNORMAL HIGH (ref 65–99)
Glucose-Capillary: 156 mg/dL — ABNORMAL HIGH (ref 65–99)
Glucose-Capillary: 162 mg/dL — ABNORMAL HIGH (ref 65–99)

## 2017-07-21 LAB — T4, FREE: Free T4: 0.84 ng/dL (ref 0.61–1.12)

## 2017-07-21 MED ORDER — ADULT MULTIVITAMIN W/MINERALS CH
1.0000 | ORAL_TABLET | Freq: Every day | ORAL | Status: DC
  Administered 2017-07-21 – 2017-07-22 (×2): 1 via ORAL
  Filled 2017-07-21 (×2): qty 1

## 2017-07-21 MED ORDER — LEVOTHYROXINE SODIUM 25 MCG PO TABS
25.0000 ug | ORAL_TABLET | Freq: Once | ORAL | Status: AC
  Administered 2017-07-21: 25 ug via ORAL
  Filled 2017-07-21: qty 1

## 2017-07-21 MED ORDER — PRO-STAT SUGAR FREE PO LIQD
30.0000 mL | Freq: Two times a day (BID) | ORAL | Status: DC
  Administered 2017-07-21 – 2017-07-22 (×3): 30 mL via ORAL
  Filled 2017-07-21 (×3): qty 30

## 2017-07-21 MED ORDER — POLYETHYLENE GLYCOL 3350 17 G PO PACK
17.0000 g | PACK | Freq: Every day | ORAL | Status: DC
  Administered 2017-07-21 – 2017-07-22 (×3): 17 g via ORAL
  Filled 2017-07-21 (×4): qty 1

## 2017-07-21 MED ORDER — METOLAZONE 5 MG PO TABS
5.0000 mg | ORAL_TABLET | Freq: Once | ORAL | Status: AC
  Administered 2017-07-21: 5 mg via ORAL
  Filled 2017-07-21: qty 1

## 2017-07-21 MED ORDER — AMIODARONE HCL 100 MG PO TABS
100.0000 mg | ORAL_TABLET | Freq: Every morning | ORAL | Status: DC
  Administered 2017-07-22: 100 mg via ORAL
  Filled 2017-07-21 (×2): qty 1

## 2017-07-21 MED ORDER — LEVOTHYROXINE SODIUM 75 MCG PO TABS
75.0000 ug | ORAL_TABLET | Freq: Every day | ORAL | Status: DC
  Administered 2017-07-22: 75 ug via ORAL
  Filled 2017-07-21: qty 1

## 2017-07-21 NOTE — Progress Notes (Signed)
Patient requesting for medication to help him have a bowel movement. Per patient he has not had bowel movement in days. Text page Dr Selena Batten. Awaiting response

## 2017-07-21 NOTE — Progress Notes (Signed)
PT Cancellation Note  Patient Details Name: [name] MRN: [id number] DOB: Apr 07, 1935   Cancelled Treatment:    Reason Eval/Treat Not Completed: Other (comment). Met with pt to discuss acute PT and potential follow-up needs. Pt from home with family; has not walked in 5-6 years and uses power w/c for all mobility. Reports mod indep with transfers and ADLs. Also reports currently working with [facility] and plans to continue with this. Very pleasantly explained that he is not interested in being on acute PT's caseload. Will d/c PT order.  Mabeline Caras, PT, DPT Acute Rehab Services  Pager: [place] 07/21/2017, 5:27 PM

## 2017-07-21 NOTE — Progress Notes (Signed)
Responded to spiritual care consult to support patient with prayer.  Prayed with patient and talked about his relationship and love for God as a christian.  Patient looking forward to a possible early discharge so he can get home to see grand and great grandchildren.  Patient was very alert and very pleasant. Chaplain will follow as needed.  Venida Jarvis, [place], [facility], Pager [phone]

## 2017-07-21 NOTE — Plan of Care (Signed)
Nutrition Education Note  RD consulted for nutrition education regarding CHF.  RD provided "Low Sodium Nutrition Therapy" handout from the Academy of [employer]. Reviewed patient's dietary recall. Provided examples on ways to decrease sodium intake in diet. Discouraged intake of processed foods and use of salt shaker. Encouraged fresh fruits and vegetables as well as whole grain sources of carbohydrates to maximize fiber intake.   RD discussed why it is important for patient to adhere to diet recommendations, and emphasized the role of fluids, foods to avoid, and importance of weighing self daily. Teach back method used.  Expect fair to good compliance.  RD will continue to follow.  Yitta Gongaware A. Mayford Knife, RD, LDN, CDE Pager: [phone] After hours Pager: [phone]

## 2017-07-21 NOTE — Care Management Note (Addendum)
Case Management Note  Patient Details  Name: BRANSEN FASSNACHT MRN: [id number] Date of Birth: Oct 26, 1934  Subjective/Objective:      CHF             Action/Plan: PCP: [name], MD; has private insurance with [facility] with prescription drug coverage; patient is active with [facility] for HHRN/ PT as prior to admission; CM will continue to follow for progression of care.  Expected Discharge Date:   possibly 07/25/2017               Expected Discharge Plan:  Home w Home Health Services  Discharge planning Services  CM Consult  Status of Service:  In process, will continue to follow  Reola Mosher [phone] 07/21/2017, 2:28 PM

## 2017-07-21 NOTE — Progress Notes (Signed)
Dr Selena Batten informed patient's BP 87/50 and asymptomatics . Patient did not get his scheduled IV lasix and  PO aldactone last night per his order.  No new orders received, Will continue to monitor patient.

## 2017-07-21 NOTE — Progress Notes (Signed)
PROGRESS NOTE    [name]  QGB:[id number] DOB: 11/10/1934 DOA: 07/19/2017 PCP: [name], MD   Brief Narrative:  [name]  is a 82 y.o. WM PMHx Paroxysmal Atrial Fibrillation, chronic Systolic CHF (EF 97-58% on last echocardiogram 04/02/2017), Ischemic Cardiomyopathy: CAD, S/P ICD 2008, Essential HTN, S/P CABG, HLD, Diabetes  Type 2  Uncontrolled with Complications, PUD.  c/o abdominal distention over the past few weeks as well as weight gain and pedal edema. Pt might be just slightly more dyspneic.  Pt denies fever, chills, cough, cp, palp, n/v, diarrhea, brbpr, black stool.  Pt presented to the ED for evaluation of possible CHF     Subjective: 12/6 A/O 4, negative CP, negative SOB. Negative abdominal pain. Negative N/V.     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 -Strict in and out since admission -3.9 L -Daily weight Filed Weights   07/19/17 1757 07/21/17 0458  Weight: 208 lb (94.3 kg) 205 lb 0.4 oz (93 kg)  -Patient with significant fluid overload by exam, requires IV Lasix -12/6 discontinue Coreg 3.125 secondary to hypotension -Lasix IV 60 mg TID + Zaroxolyn 5 mg 1 -Losartan 12.5 mg daily -Spironolactone 25 mg daily -Echocardiogram: Consistent with acute on chronic systolic and diastolic CHF decrease in EF most likely secondary to patient's fluid overload and spotty compliance with medication. -Schedule follow-up appointments Dr.  Marca Ancona cardiology (CHF clinic) 1-2 weeks, acute on chronic systolic and diastolic CHF, pulmonary HTN  Paroxysmal atrial fibrillation - 12/6 Decrease Amiodarone 100 mg daily - Eliquis 2.5 mg BID - Digoxin 0.0625 mg QOD   CKD stage IV( baseline Cr ~ 2.0) -Currently at baseline Recent Labs  Lab 07/19/17 1819 07/20/17 0239 07/21/17 0835  CREATININE 2.13* 2.06* 1.98*  -Hold all nephrotoxic medication -Schedule follow-up appointment in 1-2 weeks with Dr. Lucila Maine CKD stage IV, chronic hyponatremia, diabetes Type 2 uncontrolled with renal complications   Chronic Hyponatremia (baseline Na 128) -Most likely Secondary to CHF? Severe hypo-thyroidism. -On admission did not obtain urine osmolality, urine sodium patient now on diuretics would be inaccurate.   Hypothyroidism unspecified site -TSH extremely elevated.  -Obtain free T4/T3 -Increase Synthroid 75 g daily  -Will need to follow electrolytes closely as patients thyroid normalizes at high risk for experiencing hypokalemia, hypomagnesemia, hypophosphatemia.        DVT prophylaxis: Eliquis Code Status: Full Family Communication: None Disposition Plan: Discharge next 24-48 are sent   Consultants:    Procedures/Significant Events:  8/18 Echocardiogram:Left ventricle: moderate LVH. LVEF =35% to 40%. Dyskinesis of the mid-apicalanteroseptal and apical myocardium. - Mitral valve: mild to moderate regurgitation. --------------------------------------------------------------------------------------------------------------------------------------- 12/5 Echocardiogram:Left ventricle: mild LVH. -LVEF = 20% to 25%. Diffuse hypokinesis.  -Grade 3 diastolic dysfunction. -LEFT/RIGHT atrium: severely dilated. -Pulmonary arteries: PA peak pressure: 37 mm Hg (S).   I have personally reviewed and interpreted all radiology studies and my findings are as above.  VENTILATOR  SETTINGS:    Cultures None  Antimicrobials: None   Devices  None   LINES / TUBES:  None    Continuous Infusions: . sodium chloride    . sodium chloride       Objective: Vitals:   07/20/17 2150 07/21/17 0033 07/21/17 0458 07/21/17 0602  BP: (!) 85/45 (!) 94/55 (!) 87/50 (!) 100/51  Pulse: 66 69 67 67  Resp:   18   Temp:   97.8 F (36.6 C)   TempSrc:   Oral   SpO2:   98%   Weight:   205 lb 0.4 oz (93 kg)   Height:        Intake/Output Summary (Last 24 hours) at 07/21/2017 0828 Last data filed at 07/21/2017 0500 Gross per 24 hour  Intake -  Output 1580 ml  Net -1580 ml   Filed Weights   07/19/17 1757 07/21/17 0458  Weight: 208 lb (94.3 kg) 205 lb 0.4 oz (93 kg)    Physical Exam:  General: A/O x4, No acute respiratory distress Neck:  Negative scars, masses, torticollis, lymphadenopathy, JVD Lungs: Clear to auscultation bilaterally without wheezes or crackles Cardiovascular: Regular rate and rhythm without murmur gallop or rub normal S1 and S2 Abdomen: negative abdominal pain, nondistended, positive soft, bowel sounds, no rebound, no ascites, no appreciable mass, positive anasarca (decreasing with IV diuresis) Extremities: No significant cyanosis, clubbing.  Positive bilateral extremity edema to hips 1-2+   Skin: Negative rashes, lesions, ulcers Psychiatric:  Negative depression, negative anxiety, negative fatigue, negative mania  Central nervous system:  Cranial nerves II through XII intact, tongue/uvula midline, bilateral upper extremity muscle strength 5/5, bilateral lower extremity strength 2-3/5, sensation intact throughout,  negative dysarthria, negative expressive aphasia, negative receptive aphasia. .     Data Reviewed: Care during the described time interval was provided by me .  I have reviewed this patient's available data, including medical history, events of note, physical examination, and all test results as part of my evaluation.    CBC: Recent Labs  Lab 07/19/17 1819 07/20/17 0239  WBC 5.4 3.2*  NEUTROABS  --  2.1  HGB 8.9* 8.1*  HCT 27.1* 24.8*  MCV 82.9 83.5  PLT 183 158   Basic Metabolic Panel: Recent Labs  Lab 07/19/17 1819 07/20/17 0239 07/21/17 0612  NA 126* 129*  --   K 4.0 4.0  --   CL 92* 91*  --   CO2 21* 24  --   GLUCOSE 220* 195*  --   BUN 44* 41*  --   CREATININE 2.13* 2.06*  --   CALCIUM 8.2* 8.2*  --   MG  --   --  2.1   GFR: Estimated Creatinine Clearance: 32.8 mL/min (A) (by C-G formula based on SCr of 2.06 mg/dL (H)). Liver Function Tests: Recent Labs  Lab 07/20/17 0239  AST 22  ALT 24  ALKPHOS 96  BILITOT 0.8  PROT 6.3*  ALBUMIN 3.2*   No results for input(s): LIPASE, AMYLASE in the last 168 hours. No results for input(s): AMMONIA in the last 168 hours. Coagulation Profile: No results for input(s): INR, PROTIME in the last 168 hours. Cardiac Enzymes: Recent Labs  Lab 07/19/17 2154 07/20/17 0239 07/20/17 1230  TROPONINI 0.04* 0.04* 0.04*   BNP (last 3 results) No results for input(s): PROBNP in the last 8760 hours. HbA1C: No results for input(s): HGBA1C in the last 72 hours. CBG: Recent Labs  Lab 07/20/17 1941 07/20/17 2220  GLUCAP 113* 163*   Lipid Profile: No results for input(s): CHOL, HDL, LDLCALC, TRIG, CHOLHDL, LDLDIRECT  in the last 72 hours. Thyroid Function Tests: Recent Labs    07/19/17 2154  TSH 22.781*   Anemia Panel: No results for input(s): VITAMINB12, FOLATE, FERRITIN, TIBC, IRON, RETICCTPCT in the last 72 hours. Urine analysis:    Component Value Date/Time   COLORURINE YELLOW 05/04/2017 1322   APPEARANCEUR CLEAR 05/04/2017 1322   LABSPEC 1.008 05/04/2017 1322   PHURINE 7.0 05/04/2017 1322   GLUCOSEU NEGATIVE 05/04/2017 1322   HGBUR NEGATIVE 05/04/2017 1322   BILIRUBINUR NEGATIVE 05/04/2017 1322   KETONESUR NEGATIVE 05/04/2017 1322   PROTEINUR NEGATIVE 05/04/2017 1322   NITRITE NEGATIVE 05/04/2017 1322   LEUKOCYTESUR  NEGATIVE 05/04/2017 1322   Sepsis Labs: @LABRCNTIP (procalcitonin:4,lacticidven:4)  )No results found for this or any previous visit (from the past 240 hour(s)).       Radiology Studies: Dg Chest 2 View  Result Date: 07/19/2017 CLINICAL DATA:  82 year old male presents with complaint of bilateral leg and hand swelling. Fluid buildup. EXAM: CHEST  2 VIEW COMPARISON:  05/04/2017 FINDINGS: Stable cardiomegaly with aortic atherosclerosis. The patient is status post CABG. ICD device projects over the left axilla with lead in the right ventricle. Mild interstitial edema is noted, slightly increased since prior. IMPRESSION: 1. Cardiomegaly with slight interval increase in interstitial edema. 2. Aortic atherosclerosis. 3. ICD device in place. Electronically Signed   By: Tollie Eth M.D.   On: 07/19/2017 19:54        Scheduled Meds: . amiodarone  200 mg Oral q morning - 10a  . apixaban  2.5 mg Oral BID  . atorvastatin  10 mg Oral QHS  . carvedilol  3.125 mg Oral BID WC  . digoxin  0.0625 mg Oral QODAY  . furosemide  60 mg Intravenous TID  . insulin aspart  0-5 Units Subcutaneous QHS  . insulin aspart  0-9 Units Subcutaneous TID WC  . levothyroxine  50 mcg Oral QAC breakfast  . losartan  12.5 mg Oral Daily  . pantoprazole  40 mg Oral Daily  . polyethylene glycol  17 g Oral Daily  . potassium chloride SA  20 mEq Oral Daily  . sodium chloride flush  3 mL Intravenous Q12H  . sodium chloride flush  3 mL Intravenous Q12H  . spironolactone  25 mg Oral QHS  . vitamin C  500 mg Oral BID   Continuous Infusions: . sodium chloride    . sodium chloride       LOS: 2 days    Time spent: 40 minutes      WOODS, Roselind Messier, MD [facility] Pager [phone]   If 7PM-7AM, please contact night-coverage [email or web address] Password [email or web address] 07/21/2017, 8:28 AM

## 2017-07-21 NOTE — Progress Notes (Signed)
Initial Nutrition Assessment  DOCUMENTATION CODES:   Not applicable  INTERVENTION:   -MVI daily -30 ml Prostat BID, each supplement provides 100 kcals and 15 grams protein  NUTRITION DIAGNOSIS:   Increased nutrient needs related to wound healing as evidenced by estimated needs.  GOAL:   Patient will meet greater than or equal to 90% of their needs  MONITOR:   PO intake, Supplement acceptance, Labs, Weight trends, Skin, I & O's  REASON FOR ASSESSMENT:   Consult (new onset heart failure)  ASSESSMENT:   [name]  is a 82 y.o. male, w Rheumatoid arthritis, PUD, Dm2, Hypertension, Hyperlipidemia, CHF(EF 10-15%), s/p ICD , CAD s/p CABG, apparently presents with c/o abdominal distention over the past few weeks as well as weight gain and pedal edema. Pt might be just slightly more dyspneic.  Pt denies fever, chills, cough, cp, palp, n/v, diarrhea, brbpr, black stool.  Pt presented to the ED for evaluation of possible CHF  Pt admitted with CHF.   Spoke with pt, who reports he has a good appetite at home. He consumes 3 meals per day (meals are prepared by his granddaughter)- Breakfast: eggs, toast, sausage, coffee; Lunch: sandwich, Dinner: sandwich. Pt reports that he will sometimes go out to eat at the local [employer].   Observed pt meal tray- consumed about 75% of lunch.   Pt denies any weight loss.   Pt reports good glycemic control at home; reports CBGS usually run around 150 (last Hgb A1c: 7.1). Noted home medications for DM are 5 mg glyburide q AM.   Reviewed low sodium, DM diet guidelines with pt.   Labs reviewed: Na: 129, CBGS: 156-163 (inpatient orders for 0-5 units insulin aspart q HS, 0-9 units insulin aspart TID with meals).   NUTRITION - FOCUSED PHYSICAL EXAM:    Most Recent Value  Orbital Region  No depletion  Upper Arm Region  No depletion  Thoracic and Lumbar Region  No depletion  Buccal Region  No depletion  Temple Region  No depletion   Clavicle Bone Region  No depletion  Clavicle and Acromion Bone Region  No depletion  Scapular Bone Region  No depletion  Dorsal Hand  No depletion  Patellar Region  No depletion  Anterior Thigh Region  No depletion  Posterior Calf Region  No depletion  Edema (RD Assessment)  Mild  Hair  Reviewed  Eyes  Reviewed  Mouth  Reviewed  Skin  Reviewed  Nails  Reviewed       Diet Order:  Diet Heart Room service appropriate? Yes; Fluid consistency: Thin; Fluid restriction: 1200 mL Fluid  EDUCATION NEEDS:   Education needs have been addressed  Skin:  Skin Assessment: Skin Integrity Issues: Skin Integrity Issues:: Stage II Stage II: sacrum  Last BM:  PTA  Height:   Ht Readings from Last 1 Encounters:  07/20/17 5\' 10"  (1.778 m)    Weight:   Wt Readings from Last 1 Encounters:  07/21/17 205 lb 0.4 oz (93 kg)    Ideal Body Weight:  75.5 kg  BMI:  Body mass index is 29.42 kg/m.  Estimated Nutritional Needs:   Kcal:  1700-1900  Protein:  95-110 grams  Fluid:  1.7-1.9 L    Laconya Clere A. 14/06/18, RD, LDN, CDE Pager: [phone] After hours Pager: [phone]

## 2017-07-21 NOTE — Progress Notes (Signed)
Orders received for Miralax

## 2017-07-22 ENCOUNTER — Ambulatory Visit (INDEPENDENT_AMBULATORY_CARE_PROVIDER_SITE_OTHER): Payer: Medicare Other | Admitting: *Deleted

## 2017-07-22 DIAGNOSIS — I255 Ischemic cardiomyopathy: Secondary | ICD-10-CM

## 2017-07-22 DIAGNOSIS — N183 Chronic kidney disease, stage 3 (moderate): Secondary | ICD-10-CM

## 2017-07-22 DIAGNOSIS — I10 Essential (primary) hypertension: Secondary | ICD-10-CM

## 2017-07-22 DIAGNOSIS — I5023 Acute on chronic systolic (congestive) heart failure: Secondary | ICD-10-CM

## 2017-07-22 LAB — BASIC METABOLIC PANEL
ANION GAP: 11 (ref 5–15)
BUN: 36 mg/dL — ABNORMAL HIGH (ref 6–20)
CALCIUM: 9 mg/dL (ref 8.9–10.3)
CHLORIDE: 90 mmol/L — AB (ref 101–111)
CO2: 30 mmol/L (ref 22–32)
Creatinine, Ser: 2.08 mg/dL — ABNORMAL HIGH (ref 0.61–1.24)
GFR calc Af Amer: 33 mL/min — ABNORMAL LOW (ref >60)
GFR calc non Af Amer: 28 mL/min — ABNORMAL LOW (ref >60)
GLUCOSE: 212 mg/dL — AB (ref 65–99)
Potassium: 3.7 mmol/L (ref 3.5–5.1)
Sodium: 131 mmol/L — ABNORMAL LOW (ref 135–145)

## 2017-07-22 LAB — GLUCOSE, CAPILLARY
GLUCOSE-CAPILLARY: 190 mg/dL — AB (ref 65–99)
GLUCOSE-CAPILLARY: 225 mg/dL — AB (ref 65–99)

## 2017-07-22 LAB — T3: T3, Total: 64 ng/dL — ABNORMAL LOW (ref 71–180)

## 2017-07-22 LAB — MAGNESIUM: Magnesium: 2 mg/dL (ref 1.7–2.4)

## 2017-07-22 MED ORDER — AMIODARONE HCL 200 MG PO TABS: 100.0000 mg | ORAL_TABLET | Freq: Every day | ORAL | 0 refills | Status: DC

## 2017-07-22 MED ORDER — LEVOTHYROXINE SODIUM 75 MCG PO TABS: 75.0000 ug | ORAL_TABLET | Freq: Every day | ORAL | 0 refills | Status: DC

## 2017-07-22 NOTE — Plan of Care (Signed)
  Education: Knowledge of General Education information will improve 07/22/2017 0018 - Progressing by Jeanella Flattery, RN

## 2017-07-22 NOTE — Discharge Summary (Signed)
DISCHARGE SUMMARY  [name]  MR#: [id number]  DOB:26-Feb-1935  Date of Admission: 07/19/2017 Date of Discharge: 07/22/2017  Attending Physician:MCCLUNG,JEFFREY T  Patient's GEZ:MOQHU, Gwenyth Bouillon, MD  Consults:  none  Disposition: D/C home   Follow-up Appts: Follow-up Information    Larey Dresser, MD. Schedule an appointment as soon as possible for a visit in 2 week(s).   Specialty:  Cardiology Why:  Schedule follow-up appointments Dr. Loralie Champagne cardiology (CHF clinic) 1-2 weeks, acute on chronic systolic and diastolic CHF, pulmonary HTN Contact information: [address] [place] [address] [phone]        Myer Peer, MD. Go on 08/03/2017.   Specialty:  Family Medicine Why:  '@10'$ :45am Contact information: [place] [address] [phone]           Tests Needing Follow-up: -will need recheck of TSH in 8-12 weeks -determine if BB can be safely resumed (BP was too low during acute CHF exacerbation to allow continued dosing)  Discharge Diagnoses: Acute on Chronic Systolic and Diastolic CHF Paroxysmal atrial fibrillation Uncontrolled DM2 with Renal complications CKD stage IV (baseline Cr ~ 2.0) Chronic Hyponatremia (baseline Na 128) Hypothyroidism unspecified site  Initial presentation: 82 y.o.M w/ a Hx Paroxysmal Atrial Fibrillation, chronic Systolic CHF (EF 49-44% 9/67/5916), CAD s/p CABG, S/P ICD 2008, HTN, HLD, DM2, and PUD who c/oabdominal distention over a few weeks as well as weight gain and pedal edema.   Hospital Course:  Acute on Chronic Systolic and Diastolic CHF -per patient base weight 89.8 kg (198 pounds) -Slightly elevated troponin however given patient's poor renal function and lack of symptoms most likely secondary to his renal function versus any true cardiac issue. -net negative ~7800cc since admit  -TTE 12/5 consistent with acute on chronic systolic and diastolic CHF decrease in EF  (20-25% and grade 3 DD) most likely secondary to patient's fluid overload and spotty compliance with medication -Schedule follow-up appointments Dr. Loralie Champagne cardiology (CHF clinic) 1-2 weeks, acute on chronic systolic and diastolic CHF, pulmonary HTN  Filed Weights   07/19/17 1757 07/21/17 0458 07/22/17 0352  Weight: 94.3 kg (208 lb) 93 kg (205 lb 0.4 oz) 86.7 kg (191 lb 2.2 oz)    Paroxysmal atrial fibrillation - Amiodarone decreased to 100 mg daily in setting of hypotension  - Eliquis continues  - Digoxin continues   Uncontrolled DM2 with Renal complications -3/84 Y6Z 7.1 - CBG reasonably controlled during this hospital stay   CKD stage IV (baseline Cr ~ 2.0) -Schedule follow-up appointment in 1-2 weeks with Dr. Ann Held   Recent Labs  Lab 07/19/17 1819 07/20/17 0239 07/21/17 0835 07/22/17 0409  CREATININE 2.13* 2.06* 1.98* 2.08*   Chronic Hyponatremia (baseline Na 128) -Most likely Secondary to CHF - Na stable th/o this hospital stay   Hypothyroidism  -TSH markedly elevated at 23 - free T4 normal - T3 low  -Increased Synthroid to 75 g daily  -will need recheck of TSH in 8-12 weeks    Allergies as of 07/22/2017      Reactions   Metformin And Related Other (See Comments)   "Made sugar go wild"   Rivastigmine Nausea And Vomiting      Medication List    STOP taking these medications   carvedilol 3.125 MG tablet Commonly known as:  COREG   lisinopril 2.5 MG tablet Commonly known as:  PRINIVIL,ZESTRIL     TAKE these medications   amiodarone 200 MG tablet Commonly  known as:  PACERONE Take 0.5 tablets (100 mg total) by mouth daily. What changed:  how much to take   apixaban 2.5 MG Tabs tablet Commonly known as:  ELIQUIS Take 1 tablet (2.5 mg total) 2 (two) times daily by mouth.   benzonatate 100 MG capsule Commonly known as:  TESSALON Take 100 mg by mouth 3 (three) times daily as needed (for coughing).   digoxin 0.125 MG tablet Commonly  known as:  LANOXIN Take 0.5 tablets (0.0625 mg total) by mouth every other day.   glyBURIDE 5 MG tablet Commonly known as:  DIABETA Take 5 mg by mouth daily with breakfast.   HUMULIN R 100 units/mL injection Generic drug:  insulin regular USE VIA PUMP AS DIRECTED WITH V-GO MACHINE AS DIRECTED. MAX DD OF 66 UNITS   levothyroxine 75 MCG tablet Commonly known as:  SYNTHROID, LEVOTHROID Take 1 tablet (75 mcg total) by mouth daily before breakfast. Start taking on:  07/23/2017 What changed:    medication strength  how much to take   LIPITOR 10 MG tablet Generic drug:  atorvastatin Take 10 mg by mouth at bedtime.   losartan 25 MG tablet Commonly known as:  COZAAR Take 0.5 tablets (12.5 mg total) by mouth daily.   NEXIUM 40 MG capsule Generic drug:  esomeprazole Take 40 mg by mouth daily.   polyethylene glycol packet Commonly known as:  MIRALAX / GLYCOLAX Take 17 g by mouth daily as needed for mild constipation.   potassium chloride SA 20 MEQ tablet Commonly known as:  K-DUR,KLOR-CON Take 1 tablet (20 mEq total) by mouth daily.   spironolactone 25 MG tablet Commonly known as:  ALDACTONE Take 25 mg by mouth at bedtime.   torsemide 100 MG tablet Commonly known as:  DEMADEX Take 1 tablet (100 mg total) by mouth 2 (two) times daily.   V-GO 30 Kit daily.   vitamin C 500 MG tablet Commonly known as:  ASCORBIC ACID Take 500 mg by mouth 2 (two) times daily.       Day of Discharge BP 98/60 (BP Location: Right Arm)   Pulse 77   Temp 98.3 F (36.8 C) (Oral)   Resp 18   Ht '5\' 10"'$  (1.778 m)   Wt 86.7 kg (191 lb 2.2 oz) Comment: bed scale with rn in room weighing pt twice; pt wheelchair b  SpO2 97%   BMI 27.43 kg/m   Physical Exam: General: No acute respiratory distress Lungs: Clear to auscultation bilaterally without wheezes or crackles Cardiovascular: Regular rate without gallop or rub normal S1 and S2 Abdomen: Nontender, nondistended, soft, bowel sounds  positive, no rebound, no ascites, no appreciable mass Extremities: 1+ B LE edema in dependent position   Basic Metabolic Panel: Recent Labs  Lab 07/19/17 1819 07/20/17 0239 07/21/17 0612 07/21/17 0835 07/22/17 0409  NA 126* 129*  --  129* 131*  K 4.0 4.0  --  4.0 3.7  CL 92* 91*  --  91* 90*  CO2 21* 24  --  28 30  GLUCOSE 220* 195*  --  176* 212*  BUN 44* 41*  --  35* 36*  CREATININE 2.13* 2.06*  --  1.98* 2.08*  CALCIUM 8.2* 8.2*  --  8.5* 9.0  MG  --   --  2.1 2.1 2.0    Liver Function Tests: Recent Labs  Lab 07/20/17 0239  AST 22  ALT 24  ALKPHOS 96  BILITOT 0.8  PROT 6.3*  ALBUMIN 3.2*   CBC: Recent  Labs  Lab 07/19/17 1819 07/20/17 0239  WBC 5.4 3.2*  NEUTROABS  --  2.1  HGB 8.9* 8.1*  HCT 27.1* 24.8*  MCV 82.9 83.5  PLT 183 158    Cardiac Enzymes: Recent Labs  Lab 07/19/17 2154 07/20/17 0239 07/20/17 1230  TROPONINI 0.04* 0.04* 0.04*    CBG: Recent Labs  Lab 07/21/17 1130 07/21/17 1624 07/21/17 2140 07/22/17 0721 07/22/17 1223  GLUCAP 156* 162* 191* 190* 225*    Time spent in discharge (includes decision making & examination of pt): 35 minutes  07/22/2017, 2:29 PM   Cherene Altes, MD [facility] Office  [phone] Pager [phone]  On-Call/Text Page:      [email or web address]      password [facility]

## 2017-07-22 NOTE — Discharge Instructions (Signed)
Heart Failure °Heart failure means your heart has trouble pumping blood. This makes it hard for your body to work well. Heart failure is usually a long-term (chronic) condition. You must take good care of yourself and follow your doctor's treatment plan. °Follow these instructions at home: °· Take your heart medicine as told by your doctor. °? Do not stop taking medicine unless your doctor tells you to. °? Do not skip any dose of medicine. °? Refill your medicines before they run out. °? Take other medicines only as told by your doctor or pharmacist. °· Stay active if told by your doctor. The elderly and people with severe heart failure should talk with a doctor about physical activity. °· Eat heart-healthy foods. Choose foods that are without trans fat and are low in saturated fat, cholesterol, and salt (sodium). This includes fresh or frozen fruits and vegetables, fish, lean meats, fat-free or low-fat dairy foods, whole grains, and high-fiber foods. Lentils and dried peas and beans (legumes) are also good choices. °· Limit salt if told by your doctor. °· Cook in a healthy way. Roast, grill, broil, bake, poach, steam, or stir-fry foods. °· Limit fluids as told by your doctor. °· Weigh yourself every morning. Do this after you pee (urinate) and before you eat breakfast. Write down your weight to give to your doctor. °· Take your blood pressure and write it down if your doctor tells you to. °· Ask your doctor how to check your pulse. Check your pulse as told. °· Lose weight if told by your doctor. °· Stop smoking or chewing tobacco. Do not use gum or patches that help you quit without your doctor's approval. °· Schedule and go to doctor visits as told. °· Nonpregnant women should have no more than 1 drink a day. Men should have no more than 2 drinks a day. Talk to your doctor about drinking alcohol. °· Stop illegal drug use. °· Stay current with shots (immunizations). °· Manage your health conditions as told by your  doctor. °· Learn to manage your stress. °· Rest when you are tired. °· If it is really hot outside: °? Avoid intense activities. °? Use air conditioning or fans, or get in a cooler place. °? Avoid caffeine and alcohol. °? Wear loose-fitting, lightweight, and light-colored clothing. °· If it is really cold outside: °? Avoid intense activities. °? Layer your clothing. °? Wear mittens or gloves, a hat, and a scarf when going outside. °? Avoid alcohol. °· Learn about heart failure and get support as needed. °· Get help to maintain or improve your quality of life and your ability to care for yourself as needed. °Contact a doctor if: °· You gain weight quickly. °· You are more short of breath than usual. °· You cannot do your normal activities. °· You tire easily. °· You cough more than normal, especially with activity. °· You have any or more puffiness (swelling) in areas such as your hands, feet, ankles, or belly (abdomen). °· You cannot sleep because it is hard to breathe. °· You feel like your heart is beating fast (palpitations). °· You get dizzy or light-headed when you stand up. °Get help right away if: °· You have trouble breathing. °· There is a change in mental status, such as becoming less alert or not being able to focus. °· You have chest pain or discomfort. °· You faint. °This information is not intended to replace advice given to you by your health care provider. Make sure you   discuss any questions you have with your health care provider. °Document Released: 05/11/2008 Document Revised: 01/08/2016 Document Reviewed: 09/18/2012 °Elsevier Interactive Patient Education © 2017 [employer] ° °

## 2017-07-25 NOTE — Progress Notes (Signed)
Remote ICD transmission.   

## 2017-07-26 NOTE — Progress Notes (Signed)
HPI: FU coronary disease, status post coronary artery bypassing graft, ischemic cardiomyopathy, hypertension, diabetes, and hyperlipidemia. He also has had a previous defibrillator placed. Patient previously admitted 1/18 for heart failure/cardiogenic shock. He was noted to have MATand atrial fibrillation and started on amiodarone. Echocardiogram showed ejection fraction 15%, grade 3 DD, mild to moderate MR, mild LAE; moderate TR.Cardiac catheterization showed a 50% left main, occluded LAD, occluded circumflex and occluded right coronary artery. LIMA to LAD patent. Sequential saphenous vein graft to the first and second marginal patent. Saphenous vein graft to the PDA patent. Pulmonary capillary wedge pressure 19. Treated medically including course of milrinone. Course complicated by transaminitis and renal insuff. Echocardiogram repeated August 2018 and showed ejection fraction 20-25%, severe biatrial enlargement. Admitted 12/18 with CHF; improved with diuresis. TSH elevated and synthroid increased. Since last seen, patient states he has improved since being hospitalized. He denies dyspnea, chest pain, palpitations, syncope or worsening pedal edema.  Current Outpatient Medications  Medication Sig Dispense Refill  . amiodarone (PACERONE) 200 MG tablet Take 0.5 tablets (100 mg total) by mouth daily. 30 tablet 0  . apixaban (ELIQUIS) 2.5 MG TABS tablet Take 1 tablet (2.5 mg total) 2 (two) times daily by mouth. 180 tablet 3  . atorvastatin (LIPITOR) 10 MG tablet Take 10 mg by mouth at bedtime.     . benzonatate (TESSALON) 100 MG capsule Take 100 mg by mouth 3 (three) times daily as needed (for coughing).     Marland Kitchen digoxin (LANOXIN) 0.125 MG tablet Take 0.5 tablets (0.0625 mg total) by mouth every other day. 15 tablet 3  . esomeprazole (NEXIUM) 40 MG capsule Take 40 mg by mouth daily.      Marland Kitchen glyBURIDE (DIABETA) 5 MG tablet Take 5 mg by mouth daily with breakfast.    . HUMULIN R 100 UNIT/ML injection  USE VIA PUMP AS DIRECTED WITH V-GO MACHINE AS DIRECTED. MAX DD OF 66 UNITS  3  . Insulin Disposable Pump (V-GO 30) KIT daily.   3  . levothyroxine (SYNTHROID, LEVOTHROID) 75 MCG tablet Take 1 tablet (75 mcg total) by mouth daily before breakfast. 30 tablet 0  . losartan (COZAAR) 25 MG tablet Take 0.5 tablets (12.5 mg total) by mouth daily. 45 tablet 3  . polyethylene glycol (MIRALAX / GLYCOLAX) packet Take 17 g by mouth daily as needed for mild constipation.     . potassium chloride SA (K-DUR,KLOR-CON) 20 MEQ tablet Take 1 tablet (20 mEq total) by mouth daily. 30 tablet 6  . spironolactone (ALDACTONE) 25 MG tablet Take 25 mg by mouth at bedtime.     . torsemide (DEMADEX) 100 MG tablet Take 1 tablet (100 mg total) by mouth 2 (two) times daily. 180 tablet 3  . vitamin C (ASCORBIC ACID) 500 MG tablet Take 500 mg by mouth 2 (two) times daily.     No current facility-administered medications for this visit.      Past Medical History:  Diagnosis Date  . AICD (automatic cardioverter/defibrillator) present    DOI 2008;   . Arthritis    "mostly in my knees" (07/20/2017)  . Congestive heart failure, unspecified   . Coronary atherosclerosis of unspecified type of vessel, native or graft   . Hypothyroidism   . Ischemic cardiomyopathy   . Myocardial infarction [facility]) ?2005  . Other and unspecified hyperlipidemia   . Peptic ulcer, unspecified site, unspecified as acute or chronic, without mention of hemorrhage, perforation, or obstruction   . RA (rheumatoid arthritis) (  [place])   . Type II diabetes mellitus ([place])   . Unspecified essential hypertension   . Ventricular tachycardia ([place])    Rx via ICD 5 /2012    Past Surgical History:  Procedure Laterality Date  . CARDIAC CATHETERIZATION N/A 09/01/2016   Procedure: Right/Left Heart Cath and Coronary/Graft Angiography;  Surgeon: Larey Dresser, MD;  Location: [facility];  Service: Cardiovascular;  Laterality: N/A;  . CARPAL TUNNEL RELEASE Left  05/2008   Archie Endo 12/17/2010  . CARPAL TUNNEL RELEASE Right 03/2010   Archie Endo 04/08/2010  . CORONARY ARTERY BYPASS GRAFT  2000  . IMPLANTABLE CARDIOVERTER DEFIBRILLATOR (ICD) GENERATOR CHANGE N/A 01/25/2014   Procedure: ICD GENERATOR CHANGE;  Surgeon: Deboraha Sprang, MD;  Location[facility] CATH LAB;  Service: Cardiovascular;  Laterality: N/A;  . INGUINAL HERNIA REPAIR Right     Social History   Socioeconomic History  . Marital status: Widowed    Spouse name: Not on file  . Number of children: Not on file  . Years of education: Not on file  . Highest education level: Not on file  Social Needs  . Financial resource strain: Not on file  . Food insecurity - worry: Not on file  . Food insecurity - inability: Not on file  . Transportation needs - medical: Not on file  . Transportation needs - non-medical: Not on file  Occupational History  . Not on file  Tobacco Use  . Smoking status: Never Smoker  . Smokeless tobacco: Never Used  Substance and Sexual Activity  . Alcohol use: No  . Drug use: No  . Sexual activity: Not on file  Other Topics Concern  . Not on file  Social History Narrative   Married. Has 1 surviving child and 4 surviving grandchildren.     Family History  Problem Relation Age of Onset  . Coronary artery disease Sister   . Coronary artery disease Brother     ROS: no fevers or chills, productive cough, hemoptysis, dysphasia, odynophagia, melena, hematochezia, dysuria, hematuria, rash, seizure activity, orthopnea, PND, pedal edema, claudication. Remaining systems are negative.  Physical Exam: Well-developed frail in no acute distress.  Skin is warm and dry.  HEENT is normal.  Neck is supple.  Chest is clear to auscultation with normal expansion.  Cardiovascular exam is regular rate and rhythm.  Abdominal exam nontender or distended. No masses palpated. Extremities show trace edema. neuro grossly intact   A/P  1 chronic systolic congestive heart failure-patient  appears to be euvolemic today. We discussed the importance of fluid restriction and low sodium diet (pt eating pickles at home). Continue Demadex 100 mg twice a day.   2 ischemic cardiomyopathy-plan to continue ARB and digoxin (recent level 0.5); resume coreg 3.125 mg BID.  3 coronary artery disease-continue statin. Patient is not on aspirin given need for anticoagulation.  4 paroxysmal atrial fibrillation-patient in sinus rhythm on examination. Continue amiodarone (increase to previous dose of 200 mg daily). Continue apixaban.  5 prior ICD-followed by electrophysiology.  6 chronic stage III kidney disease-Cr 2.08 12/7.  7 Elevated TSH-Synthroid increased recently; check TSH 12 weeks  Kirk Ruths, MD

## 2017-07-27 ENCOUNTER — Encounter: Payer: Self-pay | Admitting: Cardiology

## 2017-07-29 ENCOUNTER — Ambulatory Visit (HOSPITAL_COMMUNITY)
Admission: RE | Admit: 2017-07-29 | Discharge: 2017-07-29 | Disposition: A | Discharge status: Home / Self Care | Payer: Medicare Other | Source: Ambulatory Visit | Attending: Cardiology | Admitting: Cardiology

## 2017-07-29 ENCOUNTER — Encounter: Payer: Self-pay | Admitting: Cardiology

## 2017-07-29 ENCOUNTER — Encounter (HOSPITAL_COMMUNITY): Payer: Self-pay | Admitting: Cardiology

## 2017-07-29 ENCOUNTER — Ambulatory Visit (INDEPENDENT_AMBULATORY_CARE_PROVIDER_SITE_OTHER): Payer: Medicare Other | Admitting: Cardiology

## 2017-07-29 VITALS — BP 110/58 | HR 62 | Ht 70.0 in | Wt 198.0 lb

## 2017-07-29 VITALS — BP 108/64 | HR 64 | Wt 198.0 lb

## 2017-07-29 DIAGNOSIS — I5022 Chronic systolic (congestive) heart failure: Secondary | ICD-10-CM

## 2017-07-29 DIAGNOSIS — N183 Chronic kidney disease, stage 3 unspecified: Secondary | ICD-10-CM

## 2017-07-29 DIAGNOSIS — I10 Essential (primary) hypertension: Secondary | ICD-10-CM

## 2017-07-29 DIAGNOSIS — E785 Hyperlipidemia, unspecified: Secondary | ICD-10-CM | POA: Insufficient documentation

## 2017-07-29 DIAGNOSIS — I4891 Unspecified atrial fibrillation: Secondary | ICD-10-CM | POA: Diagnosis not present

## 2017-07-29 DIAGNOSIS — Z794 Long term (current) use of insulin: Secondary | ICD-10-CM | POA: Insufficient documentation

## 2017-07-29 DIAGNOSIS — M069 Rheumatoid arthritis, unspecified: Secondary | ICD-10-CM | POA: Insufficient documentation

## 2017-07-29 DIAGNOSIS — Z95810 Presence of automatic (implantable) cardiac defibrillator: Secondary | ICD-10-CM | POA: Diagnosis not present

## 2017-07-29 DIAGNOSIS — Z8711 Personal history of peptic ulcer disease: Secondary | ICD-10-CM | POA: Insufficient documentation

## 2017-07-29 DIAGNOSIS — I480 Paroxysmal atrial fibrillation: Secondary | ICD-10-CM

## 2017-07-29 DIAGNOSIS — E039 Hypothyroidism, unspecified: Secondary | ICD-10-CM | POA: Insufficient documentation

## 2017-07-29 DIAGNOSIS — Z951 Presence of aortocoronary bypass graft: Secondary | ICD-10-CM | POA: Diagnosis not present

## 2017-07-29 DIAGNOSIS — I2510 Atherosclerotic heart disease of native coronary artery without angina pectoris: Secondary | ICD-10-CM | POA: Diagnosis present

## 2017-07-29 DIAGNOSIS — Z7901 Long term (current) use of anticoagulants: Secondary | ICD-10-CM | POA: Diagnosis not present

## 2017-07-29 DIAGNOSIS — I5023 Acute on chronic systolic (congestive) heart failure: Secondary | ICD-10-CM | POA: Diagnosis not present

## 2017-07-29 DIAGNOSIS — I255 Ischemic cardiomyopathy: Secondary | ICD-10-CM

## 2017-07-29 DIAGNOSIS — E1122 Type 2 diabetes mellitus with diabetic chronic kidney disease: Secondary | ICD-10-CM | POA: Diagnosis not present

## 2017-07-29 DIAGNOSIS — I130 Hypertensive heart and chronic kidney disease with heart failure and stage 1 through stage 4 chronic kidney disease, or unspecified chronic kidney disease: Secondary | ICD-10-CM | POA: Diagnosis present

## 2017-07-29 DIAGNOSIS — R570 Cardiogenic shock: Secondary | ICD-10-CM | POA: Diagnosis not present

## 2017-07-29 DIAGNOSIS — Z79899 Other long term (current) drug therapy: Secondary | ICD-10-CM | POA: Insufficient documentation

## 2017-07-29 DIAGNOSIS — I252 Old myocardial infarction: Secondary | ICD-10-CM | POA: Diagnosis not present

## 2017-07-29 LAB — CUP PACEART REMOTE DEVICE CHECK
Battery Remaining Longevity: 126 mo
Battery Remaining Percentage: 100 %
Brady Statistic RV Percent Paced: 0 %
HighPow Impedance: 39 Ohm
Implantable Lead Location: 753860
Implantable Lead Serial Number: 176041
Implantable Pulse Generator Implant Date: 20150612
Lead Channel Pacing Threshold Pulse Width: 0.4 ms
Lead Channel Setting Pacing Amplitude: 2.4 V
Lead Channel Setting Sensing Sensitivity: 0.5 mV
MDC IDC LEAD IMPLANT DT: 20080415
MDC IDC MSMT LEADCHNL RV IMPEDANCE VALUE: 417 Ohm
MDC IDC MSMT LEADCHNL RV PACING THRESHOLD AMPLITUDE: 0.9 V
MDC IDC PG SERIAL: 126688
MDC IDC SESS DTM: 20181207214300
MDC IDC SET LEADCHNL RV PACING PULSEWIDTH: 0.4 ms

## 2017-07-29 LAB — BASIC METABOLIC PANEL
Anion gap: 10 (ref 5–15)
BUN: 48 mg/dL — AB (ref 6–20)
CHLORIDE: 85 mmol/L — AB (ref 101–111)
CO2: 30 mmol/L (ref 22–32)
Calcium: 9 mg/dL (ref 8.9–10.3)
Creatinine, Ser: 1.9 mg/dL — ABNORMAL HIGH (ref 0.61–1.24)
GFR calc Af Amer: 37 mL/min — ABNORMAL LOW (ref >60)
GFR calc non Af Amer: 32 mL/min — ABNORMAL LOW (ref >60)
GLUCOSE: 280 mg/dL — AB (ref 65–99)
POTASSIUM: 3.9 mmol/L (ref 3.5–5.1)
Sodium: 125 mmol/L — ABNORMAL LOW (ref 135–145)

## 2017-07-29 LAB — LIPID PANEL
CHOL/HDL RATIO: 3.6 ratio
Cholesterol: 93 mg/dL (ref 0–200)
HDL: 26 mg/dL — AB (ref >40)
LDL CALC: 12 mg/dL (ref 0–99)
Triglycerides: 275 mg/dL — ABNORMAL HIGH (ref <150)
VLDL: 55 mg/dL — AB (ref 0–40)

## 2017-07-29 MED ORDER — CARVEDILOL 3.125 MG PO TABS: 3.1250 mg | ORAL_TABLET | Freq: Two times a day (BID) | ORAL | 1 refills | Status: DC

## 2017-07-29 MED ORDER — AMIODARONE HCL 200 MG PO TABS: 200.0000 mg | ORAL_TABLET | Freq: Every day | ORAL | 1 refills | Status: DC

## 2017-07-29 MED ORDER — AMIODARONE HCL 200 MG PO TABS: 200.0000 mg | ORAL_TABLET | Freq: Every day | ORAL | 3 refills | Status: AC

## 2017-07-29 MED ORDER — CARVEDILOL 3.125 MG PO TABS: 3.1250 mg | ORAL_TABLET | Freq: Two times a day (BID) | ORAL | 3 refills | Status: DC

## 2017-07-29 MED ORDER — CARVEDILOL 6.25 MG PO TABS: 6.2500 mg | ORAL_TABLET | Freq: Two times a day (BID) | ORAL | 3 refills | Status: DC

## 2017-07-29 NOTE — Patient Instructions (Signed)
Increase Carvedilol 6.25 mg (1 tab), twice a day  Labs drawn today (if we do not call you, then your lab work was stable)   You have been referred to have cardiomems  You have been referred to have compression stockings  Your physician recommends that you schedule a follow-up appointment in: 3 weeks in [facility]

## 2017-07-29 NOTE — Patient Instructions (Addendum)
Increase Amiodarone to 200 mg daily    Start Coreg 3.125 mg twice a day    Your physician recommends that you schedule a follow-up appointment with Dr.Crenshaw  Friday 11/11/17 at 10:00 am

## 2017-07-31 NOTE — Progress Notes (Signed)
Advanced Heart Failure Clinic Note   Primary Care: Ann Held MD Primary Cardiologist: Dr. Stanford Breed HF: Dr. Aundra Dubin   HPI:  [name] is a 82 y.o. male with CAD s/p CABG, hypertension, hyperlipidemia and chronic systolic and diastolic heart failure who returns for followup of dyspnea and CHF. .  Admitted in 1/18 with acute on chornic systolic HF complicated by cardiogenic shock.  Required support with both milrinone and norepinephrine. Developed transaminitis 2/2 shock liver.  He also developed atrial fibrillation with RVR requiring amiodarone to control.  Tolvaptan given for hyponatremia.  L/RHC as below. Echo 08/26/16 LVEF 10-15%, Severe LAE, RV reduced, Severe RAE, Trivial pericardial perfusion. He was diuresed and titrated off milrinone/norepinephrine.    He was admitted in 8/18 with acute on chronic systolic CHF.  Echo in 8/18 showed EF 35-40%, mildly decreased RV systolic function.   He was admitted in 12/18 with acute on chronic systolic CHF.  Echo in 12/18 showed EF 20-25%, mild LVH, PASP 37 mmHg.  This admission may have been related to high sodium intake with his diet.   He returns for followup of CHF.  At baseline, he is limited to a motorized scooter due to severe knee arthritis.  Does very little walking.  Weight is stable compared to last appointment. Not short of breath with transfers.  No chest pain.  No BRBPR/melena.  No palpitations.   ECG (personally reviewed): NSR, IVCD 146 msec  Labs 1/18: Hgb 11.8 => 10.2, digoxin 1.7 (dig decreased), Na 132, K 4.5, Creatinine 1.32 => 1.7 Labs 2/18: hgb 11.4, digoxin 1.2, TSH 11 but free T4 and free T3 normal, LFTs normal, K 4.4, creatinine 1.69 Labs 9/18: digoxin 0.6, free T4 0.78, TSH 49 Labs 12/18: K 3.7, creatinine 2.08, LFTs normal, digoxin 0.5  Echo (8/18): EF 35-40%, dyskinesis of the mid-apical anteroseptum, mildly decreased RV systolic function.  Echo (12/18): EF 20-25%, mild LVH, PASP 37 mmHg  RHC/LHC  (09/01/16) Coronary Findings  Dominance: Right Left Main: 50% ostial stenosis LAD: Totally occluded proximal LAD after D1. Medium-sized D1 patent with 40-50% stenosis proximally. LIMA-distal LAD patent. Proximal to the LIMA touchdown there are serial 80% stenoses in the mid LAD. Left Cx: LCx occluded proximally. OMs occluded proximally. Sequential SVG-OM1 and OM2 patent with good flow in target vessels. RCA: RCA occluded at the ostium. SVG-PDA patent, backfills only to distal RCA.  Right Heart  RHC Procedural Findings: Hemodynamics (mmHg) RA mean 5 RV 40/8 PA 40/18 mean 27 PCWP mean 19 LV 88/24 AO 86/52 Oxygen saturations: PA 69% AO 98% Cardiac Output (Fick) 6.43  Cardiac Index (Fick) 3.12 Cardiac Output (Thermo) 3.86 Cardiac Index (Thermo) 1.9    Past Medical History:  Diagnosis Date  . AICD (automatic cardioverter/defibrillator) present    DOI 2008;   . Arthritis    "mostly in my knees" (07/20/2017)  . Congestive heart failure, unspecified   . Coronary atherosclerosis of unspecified type of vessel, native or graft   . Hypothyroidism   . Ischemic cardiomyopathy   . Myocardial infarction [facility]) ?2005  . Other and unspecified hyperlipidemia   . Peptic ulcer, unspecified site, unspecified as acute or chronic, without mention of hemorrhage, perforation, or obstruction   . RA (rheumatoid arthritis) ([place])   . Type II diabetes mellitus ([place])   . Unspecified essential hypertension   . Ventricular tachycardia ([place])    Rx via ICD 5 /2012    Current Outpatient Medications  Medication Sig Dispense Refill  . amiodarone (PACERONE) 200 MG  tablet Take 1 tablet (200 mg total) by mouth daily. 90 tablet 3  . apixaban (ELIQUIS) 2.5 MG TABS tablet Take 1 tablet (2.5 mg total) 2 (two) times daily by mouth. 180 tablet 3  . atorvastatin (LIPITOR) 10 MG tablet Take 10 mg by mouth at bedtime.     . benzonatate (TESSALON) 100 MG capsule Take 100 mg by mouth 3 (three) times daily as needed  (for coughing).     . carvedilol (COREG) 6.25 MG tablet Take 1 tablet (6.25 mg total) by mouth 2 (two) times daily. 180 tablet 3  . digoxin (LANOXIN) 0.125 MG tablet Take 0.5 tablets (0.0625 mg total) by mouth every other day. 15 tablet 3  . esomeprazole (NEXIUM) 40 MG capsule Take 40 mg by mouth daily.      Marland Kitchen glyBURIDE (DIABETA) 5 MG tablet Take 5 mg by mouth daily with breakfast.    . HUMULIN R 100 UNIT/ML injection USE VIA PUMP AS DIRECTED WITH V-GO MACHINE AS DIRECTED. MAX DD OF 66 UNITS  3  . Insulin Disposable Pump (V-GO 30) KIT daily.   3  . levothyroxine (SYNTHROID, LEVOTHROID) 75 MCG tablet Take 1 tablet (75 mcg total) by mouth daily before breakfast. 30 tablet 0  . losartan (COZAAR) 25 MG tablet Take 0.5 tablets (12.5 mg total) by mouth daily. 45 tablet 3  . polyethylene glycol (MIRALAX / GLYCOLAX) packet Take 17 g by mouth daily as needed for mild constipation.     . potassium chloride SA (K-DUR,KLOR-CON) 20 MEQ tablet Take 1 tablet (20 mEq total) by mouth daily. 30 tablet 6  . spironolactone (ALDACTONE) 25 MG tablet Take 25 mg by mouth at bedtime.     . torsemide (DEMADEX) 100 MG tablet Take 1 tablet (100 mg total) by mouth 2 (two) times daily. 180 tablet 3  . vitamin C (ASCORBIC ACID) 500 MG tablet Take 500 mg by mouth 2 (two) times daily.     No current facility-administered medications for this encounter.     Allergies  Allergen Reactions  . Metformin And Related Other (See Comments)    "Made sugar go wild"   . Rivastigmine Nausea And Vomiting      Social History   Socioeconomic History  . Marital status: Widowed    Spouse name: Not on file  . Number of children: Not on file  . Years of education: Not on file  . Highest education level: Not on file  Social Needs  . Financial resource strain: Not on file  . Food insecurity - worry: Not on file  . Food insecurity - inability: Not on file  . Transportation needs - medical: Not on file  . Transportation needs -  non-medical: Not on file  Occupational History  . Not on file  Tobacco Use  . Smoking status: Never Smoker  . Smokeless tobacco: Never Used  Substance and Sexual Activity  . Alcohol use: No  . Drug use: No  . Sexual activity: Not on file  Other Topics Concern  . Not on file  Social History Narrative   Married. Has 1 surviving child and 4 surviving grandchildren.       Family History  Problem Relation Age of Onset  . Coronary artery disease Sister   . Coronary artery disease Brother     Vitals:   07/29/17 1335  BP: 108/64  Pulse: 64  SpO2: 98%  Weight: 198 lb (89.8 kg)   Wt Readings from Last 3 Encounters:  07/29/17 198 lb (  89.8 kg)  07/29/17 198 lb (89.8 kg)  07/22/17 191 lb 2.2 oz (86.7 kg)    PHYSICAL EXAM: General: NAD, in motorized scooter Neck: No JVD, no thyromegaly or thyroid nodule.  Lungs: Clear to auscultation bilaterally with normal respiratory effort. CV: Nondisplaced PMI.  Heart regular S1/S2, no S3/S4, no murmur.  Trace ankle edema.  No carotid bruit.  Difficult to palpate pedal pulses.  Abdomen: Soft, nontender, no hepatosplenomegaly, no distention.  Skin: Intact without lesions or rashes.  Neurologic: Alert and oriented x 3.  Psych: Normal affect. Extremities: No clubbing or cyanosis.  HEENT: Normal.   ASSESSMENT & PLAN:  1. Chronic systolic CHF:  Ischemic cardiomyopathy.  Echo 08/26/16 with LVEF 10-15%, severe biatrial enlargement, decreased RV systolic function.  Most recent echo in 12/18 with EF 20-25%.  [place].  He is minimally active at baseline due to severe arthritis.  NYHA class II-III symptoms.  He does not appear volume overloaded today.  Recent admission in 12/18 seems to have been precipitated by dietary indiscretion.  - Continue torsemide 100 mg bid.  BMET today. If weight is rising at home, I asked family to call us and we will give him a dose of metolazone.  - Continue losartan 12.5 mg daily and spironolactone 25 mg  daily.   - Continue digoxin 0.0625 daily, recent level ok.   - Increase Coreg to 6.25 mg bid.   - Wear compression stockings during the day.  - He has IVCD (146 msec).  CRT will be consideration though benefit may not be as much as with true LBBB. - I am going to see if we can get insurance approval for Cardiomems placement.  2. CKD stage III: BMET today.  3. CAD: s/p CABG.  Coronary angiography 1/18 with patent graft and no targets for revascularization.  No recent chest pain.   - Continue ASA and statin, check lipids today.  4. Atrial fibrillation:  NSR today.  No tachypalpitations.  - Continue amiodarone 200 mg daily.  LFTs normal recently.  He is on Synthroid for hypothyroidism.  He will need a regular eye exam.  - Continue Eliquis 2.5 mg BID (age, elevated creatinine).   5. Osteoarthritis: Knee OA significantly limits activity.   Followup in 3 wks with NP/PA.   Loralie Champagne, MD 07/31/17

## 2017-08-03 ENCOUNTER — Encounter (HOSPITAL_COMMUNITY): Payer: Self-pay

## 2017-08-11 ENCOUNTER — Emergency Department (HOSPITAL_COMMUNITY)
Admission: EM | Admit: 2017-08-11 | Discharge: 2017-08-11 | Disposition: A | Discharge status: Home / Self Care | Payer: Medicare Other | Attending: Emergency Medicine | Admitting: Emergency Medicine

## 2017-08-11 ENCOUNTER — Emergency Department (HOSPITAL_COMMUNITY): Payer: Medicare Other

## 2017-08-11 ENCOUNTER — Other Ambulatory Visit: Payer: Self-pay

## 2017-08-11 ENCOUNTER — Encounter (HOSPITAL_COMMUNITY): Payer: Self-pay | Admitting: Emergency Medicine

## 2017-08-11 DIAGNOSIS — N183 Chronic kidney disease, stage 3 (moderate): Secondary | ICD-10-CM | POA: Diagnosis not present

## 2017-08-11 DIAGNOSIS — I252 Old myocardial infarction: Secondary | ICD-10-CM | POA: Diagnosis not present

## 2017-08-11 DIAGNOSIS — I130 Hypertensive heart and chronic kidney disease with heart failure and stage 1 through stage 4 chronic kidney disease, or unspecified chronic kidney disease: Secondary | ICD-10-CM | POA: Diagnosis not present

## 2017-08-11 DIAGNOSIS — Z79899 Other long term (current) drug therapy: Secondary | ICD-10-CM | POA: Diagnosis not present

## 2017-08-11 DIAGNOSIS — R42 Dizziness and giddiness: Secondary | ICD-10-CM | POA: Diagnosis present

## 2017-08-11 DIAGNOSIS — I959 Hypotension, unspecified: Secondary | ICD-10-CM | POA: Insufficient documentation

## 2017-08-11 DIAGNOSIS — Z951 Presence of aortocoronary bypass graft: Secondary | ICD-10-CM | POA: Diagnosis not present

## 2017-08-11 DIAGNOSIS — E039 Hypothyroidism, unspecified: Secondary | ICD-10-CM | POA: Diagnosis not present

## 2017-08-11 DIAGNOSIS — Z95810 Presence of automatic (implantable) cardiac defibrillator: Secondary | ICD-10-CM | POA: Insufficient documentation

## 2017-08-11 DIAGNOSIS — I5023 Acute on chronic systolic (congestive) heart failure: Secondary | ICD-10-CM | POA: Diagnosis not present

## 2017-08-11 DIAGNOSIS — I2510 Atherosclerotic heart disease of native coronary artery without angina pectoris: Secondary | ICD-10-CM | POA: Insufficient documentation

## 2017-08-11 DIAGNOSIS — Z7901 Long term (current) use of anticoagulants: Secondary | ICD-10-CM | POA: Diagnosis not present

## 2017-08-11 DIAGNOSIS — Z794 Long term (current) use of insulin: Secondary | ICD-10-CM | POA: Diagnosis not present

## 2017-08-11 LAB — COMPREHENSIVE METABOLIC PANEL
ALBUMIN: 3.4 g/dL — AB (ref 3.5–5.0)
ALK PHOS: 94 U/L (ref 38–126)
ALT: 29 U/L (ref 17–63)
AST: 37 U/L (ref 15–41)
Anion gap: 13 (ref 5–15)
BUN: 51 mg/dL — ABNORMAL HIGH (ref 6–20)
CALCIUM: 8.2 mg/dL — AB (ref 8.9–10.3)
CO2: 21 mmol/L — AB (ref 22–32)
CREATININE: 2.33 mg/dL — AB (ref 0.61–1.24)
Chloride: 90 mmol/L — ABNORMAL LOW (ref 101–111)
GFR calc non Af Amer: 25 mL/min — ABNORMAL LOW (ref >60)
GFR, EST AFRICAN AMERICAN: 29 mL/min — AB (ref >60)
GLUCOSE: 132 mg/dL — AB (ref 65–99)
Potassium: 4.8 mmol/L (ref 3.5–5.1)
SODIUM: 124 mmol/L — AB (ref 135–145)
Total Bilirubin: 1 mg/dL (ref 0.3–1.2)
Total Protein: 6.3 g/dL — ABNORMAL LOW (ref 6.5–8.1)

## 2017-08-11 LAB — URINALYSIS, ROUTINE W REFLEX MICROSCOPIC
BILIRUBIN URINE: NEGATIVE
Glucose, UA: NEGATIVE mg/dL
HGB URINE DIPSTICK: NEGATIVE
Ketones, ur: NEGATIVE mg/dL
Leukocytes, UA: NEGATIVE
Nitrite: NEGATIVE
PROTEIN: NEGATIVE mg/dL
Specific Gravity, Urine: 1.009 (ref 1.005–1.030)
pH: 5 (ref 5.0–8.0)

## 2017-08-11 LAB — DIGOXIN LEVEL: DIGOXIN LVL: 0.4 ng/mL — AB (ref 0.8–2.0)

## 2017-08-11 LAB — TROPONIN I: Troponin I: 0.03 ng/mL (ref <0.03)

## 2017-08-11 LAB — CBC
HCT: 25.5 % — ABNORMAL LOW (ref 39.0–52.0)
Hemoglobin: 8.5 g/dL — ABNORMAL LOW (ref 13.0–17.0)
MCH: 27.3 pg (ref 26.0–34.0)
MCHC: 33.3 g/dL (ref 30.0–36.0)
MCV: 82 fL (ref 78.0–100.0)
PLATELETS: 163 10*3/uL (ref 150–400)
RBC: 3.11 MIL/uL — ABNORMAL LOW (ref 4.22–5.81)
RDW: 15.3 % (ref 11.5–15.5)
WBC: 6.2 10*3/uL (ref 4.0–10.5)

## 2017-08-11 LAB — LIPASE, BLOOD: Lipase: 33 U/L (ref 11–51)

## 2017-08-11 MED ORDER — ONDANSETRON HCL 4 MG/2ML IJ SOLN
4.0000 mg | Freq: Once | INTRAMUSCULAR | Status: AC | PRN
  Administered 2017-08-11: 4 mg via INTRAVENOUS
  Filled 2017-08-11: qty 2

## 2017-08-11 MED ORDER — PROMETHAZINE HCL 25 MG/ML IJ SOLN
12.5000 mg | Freq: Once | INTRAMUSCULAR | Status: AC
  Administered 2017-08-11: 12.5 mg via INTRAVENOUS
  Filled 2017-08-11: qty 1

## 2017-08-11 MED ORDER — SODIUM CHLORIDE 0.9 % IV BOLUS (SEPSIS)
1000.0000 mL | Freq: Once | INTRAVENOUS | Status: AC
  Administered 2017-08-11: 1000 mL via INTRAVENOUS

## 2017-08-11 MED ORDER — SODIUM CHLORIDE 0.9 % IV BOLUS (SEPSIS)
1000.0000 mL | Freq: Once | INTRAVENOUS | Status: AC
Start: 2017-08-11 — End: 2017-08-11
  Administered 2017-08-11: 1000 mL via INTRAVENOUS

## 2017-08-11 NOTE — ED Provider Notes (Signed)
[place] EMERGENCY DEPARTMENT Provider Note   CSN: [id number] Arrival date & time: 08/11/17  1948     History   Chief Complaint Chief Complaint  Patient presents with  . Hypotension  . Shortness of Breath    HPI [name] is a 82 y.o. male.  Pt presents to the ED today with SOB, dizziness, n/v.  The pt said sx started this morning.  EMS gave pt 500 cc bolus en route due to bp 88/58.  The pt does have a hx of CAD s/p CABG, htn, hyperlipidemia, CHF.  Last EF 20%.  The pt last saw his cardiologist (Dr. Aundra Dubin) on 12/14.  Coreg increased to bid.         Past Medical History:  Diagnosis Date  . AICD (automatic cardioverter/defibrillator) present    DOI 2008;   . Arthritis    "mostly in my knees" (07/20/2017)  . Congestive heart failure, unspecified   . Coronary atherosclerosis of unspecified type of vessel, native or graft   . Hypothyroidism   . Ischemic cardiomyopathy   . Myocardial infarction [facility]) ?2005  . Other and unspecified hyperlipidemia   . Peptic ulcer, unspecified site, unspecified as acute or chronic, without mention of hemorrhage, perforation, or obstruction   . RA (rheumatoid arthritis) ([place])   . Type II diabetes mellitus ([place])   . Unspecified essential hypertension   . Ventricular tachycardia ([place])    Rx via ICD 5 /2012    Patient Active Problem List   Diagnosis Date Noted  . CHF (congestive heart failure) ([place]) 07/19/2017  . Hyponatremia 04/02/2017  . Normocytic anemia 04/02/2017  . OA (osteoarthritis) 11/19/2016  . Paroxysmal atrial fibrillation ([place]) 09/10/2016  . CKD (chronic kidney disease), stage III ([place]) 09/10/2016  . Physical deconditioning 09/10/2016  . Single implantable cardioverter-defibrillator BSX    . Ventricular tachycardia-treated the ATP 01/29/2011  . Hx of CABG 04/17/2009  . Cardiomyopathy, ischemic 04/17/2009  . Acute on chronic systolic CHF (congestive heart failure) ([place]) 04/17/2009  . Insulin  dependent diabetes mellitus ([place]) 04/12/2009  . Hyperlipidemia LDL goal <70 04/12/2009  . Essential hypertension 04/12/2009  . PUD 04/12/2009  . Rheumatoid arthritis ([place]) 04/12/2009    Past Surgical History:  Procedure Laterality Date  . CARDIAC CATHETERIZATION N/A 09/01/2016   Procedure: Right/Left Heart Cath and Coronary/Graft Angiography;  Surgeon: Larey Dresser, MD;  Location: [place] CV LAB;  Service: Cardiovascular;  Laterality: N/A;  . CARPAL TUNNEL RELEASE Left 05/2008   Archie Endo 12/17/2010  . CARPAL TUNNEL RELEASE Right 03/2010   Archie Endo 04/08/2010  . CORONARY ARTERY BYPASS GRAFT  2000  . IMPLANTABLE CARDIOVERTER DEFIBRILLATOR (ICD) GENERATOR CHANGE N/A 01/25/2014   Procedure: ICD GENERATOR CHANGE;  Surgeon: Deboraha Sprang, MD;  Location[facility] C[facility] LAB;  Service: Cardiovascular;  Laterality: N/A;  . INGUINAL HERNIA REPAIR Right        Home Medications    Prior to Admission medications   Medication Sig Start Date End Date Taking? Authorizing Provider  amiodarone (PACERONE) 200 MG tablet Take 1 tablet (200 mg total) by mouth daily. 07/29/17  Yes Lelon Perla, MD  apixaban (ELIQUIS) 2.5 MG TABS tablet Take 1 tablet (2.5 mg total) 2 (two) times daily by mouth. 06/20/17 09/18/17 Yes Larey Dresser, MD  atorvastatin (LIPITOR) 10 MG tablet Take 10 mg by mouth at bedtime.    Yes [provider]  benzonatate (TESSALON) 100 MG capsule Take 100 mg by mouth 3 (three) times daily as  needed (for coughing).    Yes [provider]  carvedilol (COREG) 6.25 MG tablet Take 1 tablet (6.25 mg total) by mouth 2 (two) times daily. 07/29/17 10/27/17 Yes Larey Dresser, MD  digoxin (LANOXIN) 0.125 MG tablet Take 0.5 tablets (0.0625 mg total) by mouth every other day. 06/09/17  Yes Larey Dresser, MD  esomeprazole (NEXIUM) 40 MG capsule Take 40 mg by mouth daily.     Yes [provider]  glyBURIDE (DIABETA) 5 MG tablet Take 5 mg by mouth daily with breakfast.   Yes  [provider]  HUMULIN R 100 UNIT/ML injection USE VIA PUMP AS DIRECTED WITH V-GO MACHINE AS DIRECTED. MAX DD OF 66 UNITS 07/04/17  Yes [provider]  Insulin Disposable Pump (V-GO 30) KIT daily.  07/04/17  Yes [provider]  levothyroxine (SYNTHROID, LEVOTHROID) 75 MCG tablet Take 1 tablet (75 mcg total) by mouth daily before breakfast. 07/23/17  Yes Cherene Altes, MD  losartan (COZAAR) 25 MG tablet Take 0.5 tablets (12.5 mg total) by mouth daily. 11/19/16  Yes Shirley Friar, PA-C  polyethylene glycol [facility] / GLYCOLAX) packet Take 17 g by mouth daily as needed for mild constipation.    Yes [provider]  potassium chloride SA (K-DUR,KLOR-CON) 20 MEQ tablet Take 1 tablet (20 mEq total) by mouth daily. 06/09/17  Yes Larey Dresser, MD  spironolactone (ALDACTONE) 25 MG tablet Take 25 mg by mouth at bedtime.    Yes [provider]  torsemide (DEMADEX) 100 MG tablet Take 1 tablet (100 mg total) by mouth 2 (two) times daily. 05/02/17  Yes Minus Breeding, MD  vitamin C (ASCORBIC ACID) 500 MG tablet Take 500 mg by mouth 2 (two) times daily.   Yes [provider]    Family History Family History  Problem Relation Age of Onset  . Coronary artery disease Sister   . Coronary artery disease Brother     Social History Social History   Tobacco Use  . Smoking status: Never Smoker  . Smokeless tobacco: Never Used  Substance Use Topics  . Alcohol use: No  . Drug use: No     Allergies   Metformin and related and Rivastigmine   Review of Systems Review of Systems  Respiratory: Positive for shortness of breath.   Gastrointestinal: Positive for nausea and vomiting.  All other systems reviewed and are negative.    Physical Exam Updated Vital Signs BP 120/90   Pulse 75   Temp 97.9 F (36.6 C) (Oral)   Resp 15   Ht '5\' 11"'$  (1.803 m)   Wt 89.8 kg (198 lb)   SpO2 100%   BMI 27.62 kg/m   Physical Exam    Constitutional: He is oriented to person, place, and time. He appears well-developed and well-nourished.  HENT:  Head: Normocephalic and atraumatic.  Mouth/Throat: Oropharynx is clear and moist.  Eyes: EOM are normal. Pupils are equal, round, and reactive to light.  Neck: Normal range of motion. Neck supple.  Cardiovascular: Normal rate, regular rhythm, normal heart sounds and intact distal pulses.  Pulmonary/Chest: Effort normal and breath sounds normal.  Abdominal: Soft. Bowel sounds are normal.  Musculoskeletal: Normal range of motion.       Right lower leg: He exhibits edema.       Left lower leg: He exhibits edema.  Neurological: He is alert and oriented to person, place, and time.  Skin: Skin is warm and dry. Capillary refill takes less than 2  seconds.  Psychiatric: He has a normal mood and affect. His behavior is normal.  Nursing note and vitals reviewed.    ED Treatments / Results  Labs (all labs ordered are listed, but only abnormal results are displayed) Labs Reviewed  COMPREHENSIVE METABOLIC PANEL - Abnormal; Notable for the following components:      Result Value   Sodium 124 (*)    Chloride 90 (*)    CO2 21 (*)    Glucose, Bld 132 (*)    BUN 51 (*)    Creatinine, Ser 2.33 (*)    Calcium 8.2 (*)    Total Protein 6.3 (*)    Albumin 3.4 (*)    GFR calc non Af Amer 25 (*)    GFR calc Af Amer 29 (*)    All other components within normal limits  CBC - Abnormal; Notable for the following components:   RBC 3.11 (*)    Hemoglobin 8.5 (*)    HCT 25.5 (*)    All other components within normal limits  DIGOXIN LEVEL - Abnormal; Notable for the following components:   Digoxin Level 0.4 (*)    All other components within normal limits  TROPONIN I - Abnormal; Notable for the following components:   Troponin I 0.03 (*)    All other components within normal limits  LIPASE, BLOOD  URINALYSIS, ROUTINE W REFLEX MICROSCOPIC    EKG  EKG  Interpretation  Date/Time:  Thursday August 11 2017 19:54:55 EST Ventricular Rate:  68 PR Interval:    QRS Duration: 154 QT Interval:  489 QTC Calculation: 521 R Axis:   -96 Text Interpretation:  Sinus rhythm Nonspecific IVCD with LAD Consider anterior infarct No significant change since last tracing Confirmed by Isla Pence [phone]) on 08/11/2017 8:38:04 PM       Radiology Dg Chest Portable 1 View  Result Date: 08/11/2017 CLINICAL DATA:  Shortness of breath, nausea/vomiting, dizziness. Hypotension. EXAM: PORTABLE CHEST 1 VIEW COMPARISON:  07/19/2017 FINDINGS: Cardiomegaly with pulmonary vascular congestion. No frank interstitial edema. No pleural effusion or pneumothorax. Left subclavian ICD.  Postsurgical changes related to prior CABG. IMPRESSION: Cardiomegaly with pulmonary vascular congestion. No frank interstitial edema. Electronically Signed   By: Julian Hy M.D.   On: 08/11/2017 21:04    Procedures Procedures (including critical care time)  Medications Ordered in ED Medications  ondansetron (ZOFRAN) injection 4 mg (4 mg Intravenous Given 08/11/17 1959)  sodium chloride 0.9 % bolus 1,000 mL (0 mLs Intravenous Stopped 08/11/17 2147)  promethazine (PHENERGAN) injection 12.5 mg (12.5 mg Intravenous Given 08/11/17 2021)  sodium chloride 0.9 % bolus 1,000 mL (1,000 mLs Intravenous New Bag/Given 08/11/17 2024)     Initial Impression / Assessment and Plan / ED Course  I have reviewed the triage vital signs and the nursing notes.  Pertinent labs & imaging results that were available during my care of the patient were reviewed by me and considered in my medical decision making (see chart for details).    Pt is feeling much better now that his bp has improved..  I suspect the coreg increased dose is the cause.  Pt will be told to go hold that medication and call Dr. Aundra Dubin tomorrow.  He does also have an appt with his pcp tomorrow morning.  The pt is also told to hold  his other bp meds.  The pt is offered admission, but he wants to go home as he is feeling better.  He knows to return if worse.  Final Clinical Impressions(s) / ED Diagnoses   Final diagnoses:  Hypotension, unspecified hypotension type    ED Discharge Orders    None       Isla Pence, MD 08/11/17 2330

## 2017-08-11 NOTE — ED Triage Notes (Signed)
Per [facility], pt coming from home withy complaints of SOB, N&V, and dizzy spells since this morning. Pt BP was 88/58 upon arrival but after 500cc bolus new BP is currently 102/60.

## 2017-08-11 NOTE — Discharge Instructions (Signed)
Do not take coreg, aldactone, losartan tonight or tomorrow morning.  Recheck bp in morning.  F/u with pcp as scheduled.

## 2017-08-11 NOTE — ED Notes (Signed)
X-ray at bedside

## 2017-08-19 ENCOUNTER — Encounter (HOSPITAL_COMMUNITY): Payer: Self-pay

## 2017-08-19 ENCOUNTER — Ambulatory Visit (HOSPITAL_COMMUNITY)
Admission: RE | Admit: 2017-08-19 | Discharge: 2017-08-19 | Disposition: A | Discharge status: Home / Self Care | Payer: Medicare Other | Source: Ambulatory Visit | Attending: Internal Medicine | Admitting: Internal Medicine

## 2017-08-19 VITALS — BP 120/68 | HR 74 | Wt 206.0 lb

## 2017-08-19 DIAGNOSIS — R740 Nonspecific elevation of levels of transaminase and lactic acid dehydrogenase [LDH]: Secondary | ICD-10-CM | POA: Diagnosis not present

## 2017-08-19 DIAGNOSIS — I252 Old myocardial infarction: Secondary | ICD-10-CM | POA: Diagnosis not present

## 2017-08-19 DIAGNOSIS — Z951 Presence of aortocoronary bypass graft: Secondary | ICD-10-CM | POA: Diagnosis not present

## 2017-08-19 DIAGNOSIS — N183 Chronic kidney disease, stage 3 unspecified: Secondary | ICD-10-CM

## 2017-08-19 DIAGNOSIS — I2510 Atherosclerotic heart disease of native coronary artery without angina pectoris: Secondary | ICD-10-CM | POA: Insufficient documentation

## 2017-08-19 DIAGNOSIS — I130 Hypertensive heart and chronic kidney disease with heart failure and stage 1 through stage 4 chronic kidney disease, or unspecified chronic kidney disease: Secondary | ICD-10-CM | POA: Diagnosis not present

## 2017-08-19 DIAGNOSIS — Z95810 Presence of automatic (implantable) cardiac defibrillator: Secondary | ICD-10-CM | POA: Diagnosis not present

## 2017-08-19 DIAGNOSIS — I255 Ischemic cardiomyopathy: Secondary | ICD-10-CM

## 2017-08-19 DIAGNOSIS — E785 Hyperlipidemia, unspecified: Secondary | ICD-10-CM | POA: Insufficient documentation

## 2017-08-19 DIAGNOSIS — M1710 Unilateral primary osteoarthritis, unspecified knee: Secondary | ICD-10-CM | POA: Diagnosis not present

## 2017-08-19 DIAGNOSIS — E039 Hypothyroidism, unspecified: Secondary | ICD-10-CM | POA: Diagnosis not present

## 2017-08-19 DIAGNOSIS — I5022 Chronic systolic (congestive) heart failure: Secondary | ICD-10-CM

## 2017-08-19 DIAGNOSIS — I5042 Chronic combined systolic (congestive) and diastolic (congestive) heart failure: Secondary | ICD-10-CM | POA: Insufficient documentation

## 2017-08-19 DIAGNOSIS — E1122 Type 2 diabetes mellitus with diabetic chronic kidney disease: Secondary | ICD-10-CM | POA: Insufficient documentation

## 2017-08-19 DIAGNOSIS — Z794 Long term (current) use of insulin: Secondary | ICD-10-CM | POA: Diagnosis not present

## 2017-08-19 DIAGNOSIS — Z79899 Other long term (current) drug therapy: Secondary | ICD-10-CM | POA: Diagnosis not present

## 2017-08-19 DIAGNOSIS — I480 Paroxysmal atrial fibrillation: Secondary | ICD-10-CM

## 2017-08-19 DIAGNOSIS — Z888 Allergy status to other drugs, medicaments and biological substances status: Secondary | ICD-10-CM | POA: Insufficient documentation

## 2017-08-19 DIAGNOSIS — Z8249 Family history of ischemic heart disease and other diseases of the circulatory system: Secondary | ICD-10-CM | POA: Diagnosis not present

## 2017-08-19 DIAGNOSIS — Z7901 Long term (current) use of anticoagulants: Secondary | ICD-10-CM | POA: Insufficient documentation

## 2017-08-19 DIAGNOSIS — Z8711 Personal history of peptic ulcer disease: Secondary | ICD-10-CM | POA: Insufficient documentation

## 2017-08-19 DIAGNOSIS — I4891 Unspecified atrial fibrillation: Secondary | ICD-10-CM | POA: Insufficient documentation

## 2017-08-19 DIAGNOSIS — M069 Rheumatoid arthritis, unspecified: Secondary | ICD-10-CM | POA: Diagnosis not present

## 2017-08-19 LAB — BASIC METABOLIC PANEL
Anion gap: 9 (ref 5–15)
BUN: 45 mg/dL — ABNORMAL HIGH (ref 6–20)
CHLORIDE: 93 mmol/L — AB (ref 101–111)
CO2: 28 mmol/L (ref 22–32)
Calcium: 9 mg/dL (ref 8.9–10.3)
Creatinine, Ser: 1.98 mg/dL — ABNORMAL HIGH (ref 0.61–1.24)
GFR calc Af Amer: 35 mL/min — ABNORMAL LOW (ref >60)
GFR calc non Af Amer: 30 mL/min — ABNORMAL LOW (ref >60)
GLUCOSE: 61 mg/dL — AB (ref 65–99)
POTASSIUM: 3.7 mmol/L (ref 3.5–5.1)
SODIUM: 130 mmol/L — AB (ref 135–145)

## 2017-08-19 NOTE — Progress Notes (Signed)
Advanced Heart Failure Clinic Note   Primary Care: Ann Held MD Primary Cardiologist: Dr. Stanford Breed HF: Dr. Aundra Dubin   HPI:  [name] is a 83 y.o. male with CAD s/p CABG, hypertension, hyperlipidemia and chronic systolic and diastolic heart failure who returns for followup of dyspnea and CHF. .  Admitted in 1/18 with acute on chornic systolic HF complicated by cardiogenic shock.  Required support with both milrinone and norepinephrine. Developed transaminitis 2/2 shock liver.  He also developed atrial fibrillation with RVR requiring amiodarone to control.  Tolvaptan given for hyponatremia.  L/RHC as below. Echo 08/26/16 LVEF 10-15%, Severe LAE, RV reduced, Severe RAE, Trivial pericardial perfusion. He was diuresed and titrated off milrinone/norepinephrine.    He was admitted in 8/18 with acute on chronic systolic CHF.  Echo in 8/18 showed EF 35-40%, mildly decreased RV systolic function.   He was admitted in 12/18 with acute on chronic systolic CHF.  Echo in 12/18 showed EF 20-25%, mild LVH, PASP 37 mmHg.  This admission may have been related to high sodium intake with his diet.   Today he returns for HF follow up. Last visit he was volume overloaded so torsemide was increased to 100 mg twice a day. He was evaluated in the ED on 12/26 for hypotension and shortness of breath with torsemide, carvedilol, and spiro for 2 days then restarted. He saw his PCP a few days later with torsemide gradually restarted, coreg added at 3.125 mg twice a day, and spiro 25 mg daily. Over the last week his weight has gone up form 198-->204 pounds. Mobility very limited and he uses a motorized scooter almost all the time. Able to perform ADLs but does have some dyspnea. Eats take out a lot and has been eating [employer] soup. Drinks >2 liters per day. Followed by [facility]. Has been taking all medications.    Labs 1/18: Hgb 11.8 => 10.2, digoxin 1.7 (dig decreased), Na 132, K 4.5, Creatinine 1.32 => 1.7 Labs 2/18: hgb  11.4, digoxin 1.2, TSH 11 but free T4 and free T3 normal, LFTs normal, K 4.4, creatinine 1.69 Labs 9/18: digoxin 0.6, free T4 0.78, TSH 49 Labs 12/18: K 3.7, creatinine 2.08, LFTs normal, digoxin 0.5 Labs 08/11/17: K 4.8 Creatinine 2.33   Echo (8/18): EF 35-40%, dyskinesis of the mid-apical anteroseptum, mildly decreased RV systolic function.  Echo (12/18): EF 20-25%, mild LVH, PASP 37 mmHg  RHC/LHC (09/01/16) Coronary Findings  Dominance: Right Left Main: 50% ostial stenosis LAD: Totally occluded proximal LAD after D1. Medium-sized D1 patent with 40-50% stenosis proximally. LIMA-distal LAD patent. Proximal to the LIMA touchdown there are serial 80% stenoses in the mid LAD. Left Cx: LCx occluded proximally. OMs occluded proximally. Sequential SVG-OM1 and OM2 patent with good flow in target vessels. RCA: RCA occluded at the ostium. SVG-PDA patent, backfills only to distal RCA.  Right Heart  RHC Procedural Findings: Hemodynamics (mmHg) RA mean 5 RV 40/8 PA 40/18 mean 27 PCWP mean 19 LV 88/24 AO 86/52 Oxygen saturations: PA 69% AO 98% Cardiac Output (Fick) 6.43  Cardiac Index (Fick) 3.12 Cardiac Output (Thermo) 3.86 Cardiac Index (Thermo) 1.9    Past Medical History:  Diagnosis Date  . AICD (automatic cardioverter/defibrillator) present    DOI 2008;   . Arthritis    "mostly in my knees" (07/20/2017)  . Congestive heart failure, unspecified   . Coronary atherosclerosis of unspecified type of vessel, native or graft   . Hypothyroidism   . Ischemic cardiomyopathy   . Myocardial  infarction [facility]) ?2005  . Other and unspecified hyperlipidemia   . Peptic ulcer, unspecified site, unspecified as acute or chronic, without mention of hemorrhage, perforation, or obstruction   . RA (rheumatoid arthritis) ([place])   . Type II diabetes mellitus ([place])   . Unspecified essential hypertension   . Ventricular tachycardia ([place])    Rx via ICD 5 /2012    Current Outpatient Medications    Medication Sig Dispense Refill  . amiodarone (PACERONE) 200 MG tablet Take 1 tablet (200 mg total) by mouth daily. 90 tablet 3  . apixaban (ELIQUIS) 2.5 MG TABS tablet Take 1 tablet (2.5 mg total) 2 (two) times daily by mouth. 180 tablet 3  . atorvastatin (LIPITOR) 10 MG tablet Take 10 mg by mouth at bedtime.     . benzonatate (TESSALON) 100 MG capsule Take 100 mg by mouth 3 (three) times daily as needed (for coughing).     . carvedilol (COREG) 3.125 MG tablet Take 3.125 mg by mouth 2 (two) times daily with a meal.    . digoxin (LANOXIN) 0.125 MG tablet Take 0.5 tablets (0.0625 mg total) by mouth every other day. 15 tablet 3  . esomeprazole (NEXIUM) 40 MG capsule Take 40 mg by mouth daily.      Marland Kitchen glyBURIDE (DIABETA) 5 MG tablet Take 5 mg by mouth daily with breakfast.    . HUMULIN R 100 UNIT/ML injection USE VIA PUMP AS DIRECTED WITH V-GO MACHINE AS DIRECTED. MAX DD OF 66 UNITS  3  . Insulin Disposable Pump (V-GO 30) KIT daily.   3  . levothyroxine (SYNTHROID, LEVOTHROID) 75 MCG tablet Take 1 tablet (75 mcg total) by mouth daily before breakfast. 30 tablet 0  . losartan (COZAAR) 25 MG tablet Take 0.5 tablets (12.5 mg total) by mouth daily. 45 tablet 3  . polyethylene glycol (MIRALAX / GLYCOLAX) packet Take 17 g by mouth daily as needed for mild constipation.     . potassium chloride SA (K-DUR,KLOR-CON) 20 MEQ tablet Take 1 tablet (20 mEq total) by mouth daily. 30 tablet 6  . spironolactone (ALDACTONE) 25 MG tablet Take 25 mg by mouth at bedtime.     . torsemide (DEMADEX) 100 MG tablet Take 100 mg by mouth 2 (two) times daily.    . vitamin C (ASCORBIC ACID) 500 MG tablet Take 500 mg by mouth 2 (two) times daily.    . carvedilol (COREG) 6.25 MG tablet Take 1 tablet (6.25 mg total) by mouth 2 (two) times daily. (Patient not taking: Reported on 08/19/2017) 180 tablet 3  . torsemide (DEMADEX) 100 MG tablet Take 1 tablet (100 mg total) by mouth 2 (two) times daily. 180 tablet 3   No current  facility-administered medications for this encounter.     Allergies  Allergen Reactions  . Metformin And Related Other (See Comments)    "Made sugar go wild"   . Rivastigmine Nausea And Vomiting      Social History   Socioeconomic History  . Marital status: Widowed    Spouse name: Not on file  . Number of children: Not on file  . Years of education: Not on file  . Highest education level: Not on file  Social Needs  . Financial resource strain: Not on file  . Food insecurity - worry: Not on file  . Food insecurity - inability: Not on file  . Transportation needs - medical: Not on file  . Transportation needs - non-medical: Not on file  Occupational History  . Not  on file  Tobacco Use  . Smoking status: Never Smoker  . Smokeless tobacco: Never Used  Substance and Sexual Activity  . Alcohol use: No  . Drug use: No  . Sexual activity: Not on file  Other Topics Concern  . Not on file  Social History Narrative   Married. Has 1 surviving child and 4 surviving grandchildren.       Family History  Problem Relation Age of Onset  . Coronary artery disease Sister   . Coronary artery disease Brother     Vitals:   08/19/17 1125  BP: 120/68  Pulse: 74  SpO2: 100%  Weight: 206 lb (93.4 kg)   Wt Readings from Last 3 Encounters:  08/19/17 206 lb (93.4 kg)  08/11/17 198 lb (89.8 kg)  07/29/17 198 lb (89.8 kg)    PHYSICAL EXAM: General:  Elderly. Appears chronically ill. Arrives in a motorized scooter with his son.  HEENT: normal Neck: supple. JVD ~10. Carotids 2+ bilat; no bruits. No lymphadenopathy or thryomegaly appreciated. Cor: PMI nondisplaced. Regular rate & rhythm. No rubs, gallops or murmurs. Lungs: clear Abdomen: soft, nontender, distended. No hepatosplenomegaly. No bruits or masses. Good bowel sounds. Extremities: no cyanosis, clubbing, rash, R and LLE 1+ edema Neuro: alert & orientedx3, cranial nerves grossly intact. moves all 4 extremities w/o difficulty.  Affect pleasant  ASSESSMENT & PLAN:  1. Chronic systolic CHF:  Ischemic cardiomyopathy.  Echo 08/26/16 with LVEF 10-15%, severe biatrial enlargement, decreased RV systolic function.  Most recent echo in 12/18 with EF 20-25%.  [place] ICD.   -NYHA III. Volume status mildly elevated. Continue torsemide 100 mg twice a day and he was instructed to take an d additional 20  Mg of torsemide for the next 2 days. - Continue torsemide 100 mg bid. - Continue coreg 3.125 mg twice a day. Can consider increasing at the next visit.  - Continue losartan 12.5 mg daily and spironolactone 25 mg daily.   - Continue digoxin 0.0625 daily, recent level ok.   - Wear compression stockings during the day.  - He has IVCD (146 msec).  CRT will be consideration though benefit may not be as much as with true LBBB. 2. CKD stage III:  BMET today.   3. CAD: s/p CABG.  Coronary angiography 1/18 with patent graft and no targets for revascularization.   No S/S ischemia.    - Continue ASA and statin.   4. Atrial fibrillation:   Regular pulse.  - Continue amiodarone 200 mg daily.  LFTs normal recently.  He is on Synthroid for hypothyroidism.  He will need a regular eye exam.  - Continue Eliquis 2.5 mg BID (age, elevated creatinine).   5. Osteoarthritis: Knee OA significantly limits activity.   Today he was provided information on the cardiomems booklet and signed a consent for cardiomems. He is NYHA III. This will be beneficial to monitor his volume with this device.   [facility] to start telemonitoring and add diuretic regimen.   We also discussed limiting fluid intake to < 2 liters per day and to limit high salt foods such as soup and take out foods.   Follow up in the clinic 2-3 weeks.     Darrick Grinder, NP 08/19/17

## 2017-08-19 NOTE — Patient Instructions (Signed)
Will refer you to [facility] for diuretic protocol and telemonitoring.  Will begin approval process for cardiomems. This may take on average a month's time to get initial approval through insurance. Our office will contact you by phone with updates.  Routine lab work today. Will notify you of abnormal results, otherwise no news is good news!  Follow up 2-3 weeks.  Take all medication as prescribed the day of your appointment. Bring all medications with you to your appointment.  Do the following things EVERYDAY: 1) Weigh yourself in the morning before breakfast. Write it down and keep it in a log. 2) Take your medicines as prescribed 3) Eat low salt foods-Limit salt (sodium) to 2000 mg per day.  4) Stay as active as you can everyday 5) Limit all fluids for the day to less than 2 liters

## 2017-08-24 ENCOUNTER — Encounter (HOSPITAL_COMMUNITY): Payer: Self-pay

## 2017-08-24 NOTE — Progress Notes (Signed)
Cardiomems referral faxed to [facility]. Referral packet filed for pending scheduling.  Ave Filter, RN

## 2017-08-25 ENCOUNTER — Other Ambulatory Visit: Payer: Self-pay

## 2017-08-25 ENCOUNTER — Emergency Department (HOSPITAL_COMMUNITY): Payer: Medicare Other

## 2017-08-25 ENCOUNTER — Inpatient Hospital Stay (HOSPITAL_COMMUNITY)
Admission: EM | Admit: 2017-08-25 | Discharge: 2017-08-27 | DRG: 291 | Disposition: A | Discharge status: Home / Self Care | Payer: Medicare Other | Attending: Cardiology | Admitting: Cardiology

## 2017-08-25 DIAGNOSIS — J9811 Atelectasis: Secondary | ICD-10-CM | POA: Diagnosis present

## 2017-08-25 DIAGNOSIS — Z794 Long term (current) use of insulin: Secondary | ICD-10-CM

## 2017-08-25 DIAGNOSIS — E871 Hypo-osmolality and hyponatremia: Secondary | ICD-10-CM | POA: Diagnosis present

## 2017-08-25 DIAGNOSIS — Z8249 Family history of ischemic heart disease and other diseases of the circulatory system: Secondary | ICD-10-CM

## 2017-08-25 DIAGNOSIS — I5023 Acute on chronic systolic (congestive) heart failure: Secondary | ICD-10-CM | POA: Diagnosis present

## 2017-08-25 DIAGNOSIS — Z79899 Other long term (current) drug therapy: Secondary | ICD-10-CM

## 2017-08-25 DIAGNOSIS — I130 Hypertensive heart and chronic kidney disease with heart failure and stage 1 through stage 4 chronic kidney disease, or unspecified chronic kidney disease: Principal | ICD-10-CM | POA: Diagnosis present

## 2017-08-25 DIAGNOSIS — E785 Hyperlipidemia, unspecified: Secondary | ICD-10-CM | POA: Diagnosis present

## 2017-08-25 DIAGNOSIS — Z95810 Presence of automatic (implantable) cardiac defibrillator: Secondary | ICD-10-CM

## 2017-08-25 DIAGNOSIS — E039 Hypothyroidism, unspecified: Secondary | ICD-10-CM | POA: Diagnosis present

## 2017-08-25 DIAGNOSIS — M1710 Unilateral primary osteoarthritis, unspecified knee: Secondary | ICD-10-CM | POA: Diagnosis present

## 2017-08-25 DIAGNOSIS — Z79890 Hormone replacement therapy (postmenopausal): Secondary | ICD-10-CM | POA: Diagnosis not present

## 2017-08-25 DIAGNOSIS — I480 Paroxysmal atrial fibrillation: Secondary | ICD-10-CM | POA: Diagnosis present

## 2017-08-25 DIAGNOSIS — Z888 Allergy status to other drugs, medicaments and biological substances status: Secondary | ICD-10-CM

## 2017-08-25 DIAGNOSIS — N183 Chronic kidney disease, stage 3 (moderate): Secondary | ICD-10-CM

## 2017-08-25 DIAGNOSIS — Z8711 Personal history of peptic ulcer disease: Secondary | ICD-10-CM | POA: Diagnosis not present

## 2017-08-25 DIAGNOSIS — I2510 Atherosclerotic heart disease of native coronary artery without angina pectoris: Secondary | ICD-10-CM | POA: Diagnosis present

## 2017-08-25 DIAGNOSIS — E1122 Type 2 diabetes mellitus with diabetic chronic kidney disease: Secondary | ICD-10-CM | POA: Diagnosis present

## 2017-08-25 DIAGNOSIS — M069 Rheumatoid arthritis, unspecified: Secondary | ICD-10-CM | POA: Diagnosis present

## 2017-08-25 DIAGNOSIS — I509 Heart failure, unspecified: Secondary | ICD-10-CM | POA: Diagnosis not present

## 2017-08-25 DIAGNOSIS — Z951 Presence of aortocoronary bypass graft: Secondary | ICD-10-CM | POA: Diagnosis not present

## 2017-08-25 DIAGNOSIS — Z993 Dependence on wheelchair: Secondary | ICD-10-CM

## 2017-08-25 DIAGNOSIS — I255 Ischemic cardiomyopathy: Secondary | ICD-10-CM | POA: Diagnosis present

## 2017-08-25 DIAGNOSIS — I252 Old myocardial infarction: Secondary | ICD-10-CM | POA: Diagnosis not present

## 2017-08-25 DIAGNOSIS — Z9111 Patient's noncompliance with dietary regimen: Secondary | ICD-10-CM | POA: Diagnosis not present

## 2017-08-25 LAB — BASIC METABOLIC PANEL
Anion gap: 10 (ref 5–15)
BUN: 41 mg/dL — ABNORMAL HIGH (ref 6–20)
CALCIUM: 7.8 mg/dL — AB (ref 8.9–10.3)
CHLORIDE: 93 mmol/L — AB (ref 101–111)
CO2: 22 mmol/L (ref 22–32)
CREATININE: 1.91 mg/dL — AB (ref 0.61–1.24)
GFR calc non Af Amer: 32 mL/min — ABNORMAL LOW (ref >60)
GFR, EST AFRICAN AMERICAN: 37 mL/min — AB (ref >60)
Glucose, Bld: 161 mg/dL — ABNORMAL HIGH (ref 65–99)
Potassium: 3.6 mmol/L (ref 3.5–5.1)
SODIUM: 125 mmol/L — AB (ref 135–145)

## 2017-08-25 LAB — CBC WITH DIFFERENTIAL/PLATELET
Basophils Absolute: 0 10*3/uL (ref 0.0–0.1)
Basophils Relative: 0 %
EOS ABS: 0.1 10*3/uL (ref 0.0–0.7)
EOS PCT: 2 %
HCT: 27 % — ABNORMAL LOW (ref 39.0–52.0)
Hemoglobin: 8.7 g/dL — ABNORMAL LOW (ref 13.0–17.0)
LYMPHS ABS: 0.8 10*3/uL (ref 0.7–4.0)
LYMPHS PCT: 18 %
MCH: 26.2 pg (ref 26.0–34.0)
MCHC: 32.2 g/dL (ref 30.0–36.0)
MCV: 81.3 fL (ref 78.0–100.0)
MONOS PCT: 10 %
Monocytes Absolute: 0.4 10*3/uL (ref 0.1–1.0)
Neutro Abs: 3.1 10*3/uL (ref 1.7–7.7)
Neutrophils Relative %: 70 %
Platelets: 193 10*3/uL (ref 150–400)
RBC: 3.32 MIL/uL — ABNORMAL LOW (ref 4.22–5.81)
RDW: 14.9 % (ref 11.5–15.5)
WBC: 4.4 10*3/uL (ref 4.0–10.5)

## 2017-08-25 LAB — COMPREHENSIVE METABOLIC PANEL
ALT: 23 U/L (ref 17–63)
ANION GAP: 12 (ref 5–15)
AST: 25 U/L (ref 15–41)
Albumin: 3.6 g/dL (ref 3.5–5.0)
Alkaline Phosphatase: 106 U/L (ref 38–126)
BUN: 43 mg/dL — ABNORMAL HIGH (ref 6–20)
CO2: 23 mmol/L (ref 22–32)
CREATININE: 2.04 mg/dL — AB (ref 0.61–1.24)
Calcium: 8.6 mg/dL — ABNORMAL LOW (ref 8.9–10.3)
Chloride: 89 mmol/L — ABNORMAL LOW (ref 101–111)
GFR, EST AFRICAN AMERICAN: 34 mL/min — AB (ref >60)
GFR, EST NON AFRICAN AMERICAN: 29 mL/min — AB (ref >60)
Glucose, Bld: 167 mg/dL — ABNORMAL HIGH (ref 65–99)
Potassium: 4.3 mmol/L (ref 3.5–5.1)
SODIUM: 124 mmol/L — AB (ref 135–145)
Total Bilirubin: 0.9 mg/dL (ref 0.3–1.2)
Total Protein: 6.8 g/dL (ref 6.5–8.1)

## 2017-08-25 LAB — I-STAT TROPONIN, ED: TROPONIN I, POC: 0.04 ng/mL (ref 0.00–0.08)

## 2017-08-25 LAB — BRAIN NATRIURETIC PEPTIDE: B Natriuretic Peptide: 1102.9 pg/mL — ABNORMAL HIGH (ref 0.0–100.0)

## 2017-08-25 LAB — DIGOXIN LEVEL: DIGOXIN LVL: 0.7 ng/mL — AB (ref 0.8–2.0)

## 2017-08-25 MED ORDER — PANTOPRAZOLE SODIUM 40 MG PO TBEC
80.0000 mg | DELAYED_RELEASE_TABLET | Freq: Every day | ORAL | Status: DC
  Administered 2017-08-26 – 2017-08-27 (×2): 80 mg via ORAL
  Filled 2017-08-25 (×2): qty 2

## 2017-08-25 MED ORDER — SODIUM CHLORIDE 0.9 % IV SOLN: 250.0000 mL | INTRAVENOUS | Status: DC | PRN

## 2017-08-25 MED ORDER — ATORVASTATIN CALCIUM 10 MG PO TABS
10.0000 mg | ORAL_TABLET | Freq: Every day | ORAL | Status: DC
  Administered 2017-08-25 – 2017-08-26 (×2): 10 mg via ORAL
  Filled 2017-08-25 (×2): qty 1

## 2017-08-25 MED ORDER — TOLVAPTAN 15 MG PO TABS
15.0000 mg | ORAL_TABLET | ORAL | Status: DC
  Administered 2017-08-26: 15 mg via ORAL
  Filled 2017-08-25 (×2): qty 1

## 2017-08-25 MED ORDER — ONDANSETRON HCL 4 MG/2ML IJ SOLN
4.0000 mg | Freq: Four times a day (QID) | INTRAMUSCULAR | Status: DC | PRN
  Administered 2017-08-26: 4 mg via INTRAVENOUS
  Filled 2017-08-25: qty 2

## 2017-08-25 MED ORDER — BENZONATATE 100 MG PO CAPS: 100.0000 mg | ORAL_CAPSULE | Freq: Three times a day (TID) | ORAL | Status: DC | PRN

## 2017-08-25 MED ORDER — GLYBURIDE 5 MG PO TABS
5.0000 mg | ORAL_TABLET | Freq: Every day | ORAL | Status: DC
  Administered 2017-08-26 – 2017-08-27 (×2): 5 mg via ORAL
  Filled 2017-08-25 (×4): qty 1

## 2017-08-25 MED ORDER — SODIUM CHLORIDE 0.9% FLUSH: 3.0000 mL | INTRAVENOUS | Status: DC | PRN

## 2017-08-25 MED ORDER — ALBUTEROL SULFATE (2.5 MG/3ML) 0.083% IN NEBU
5.0000 mg | INHALATION_SOLUTION | Freq: Once | RESPIRATORY_TRACT | Status: AC
  Administered 2017-08-25: 5 mg via RESPIRATORY_TRACT
  Filled 2017-08-25: qty 6

## 2017-08-25 MED ORDER — FUROSEMIDE 10 MG/ML IJ SOLN
60.0000 mg | Freq: Once | INTRAMUSCULAR | Status: AC
  Administered 2017-08-25: 60 mg via INTRAVENOUS
  Filled 2017-08-25: qty 6

## 2017-08-25 MED ORDER — SODIUM CHLORIDE 0.9% FLUSH
3.0000 mL | Freq: Two times a day (BID) | INTRAVENOUS | Status: DC
  Administered 2017-08-25 – 2017-08-27 (×4): 3 mL via INTRAVENOUS

## 2017-08-25 MED ORDER — AMIODARONE HCL 200 MG PO TABS
200.0000 mg | ORAL_TABLET | Freq: Every day | ORAL | Status: DC
  Administered 2017-08-25 – 2017-08-27 (×3): 200 mg via ORAL
  Filled 2017-08-25 (×4): qty 1

## 2017-08-25 MED ORDER — DIGOXIN 125 MCG PO TABS
0.0625 mg | ORAL_TABLET | ORAL | Status: DC
  Administered 2017-08-25 – 2017-08-27 (×2): 0.0625 mg via ORAL
  Filled 2017-08-25 (×2): qty 1

## 2017-08-25 MED ORDER — LEVOTHYROXINE SODIUM 75 MCG PO TABS
75.0000 ug | ORAL_TABLET | Freq: Every day | ORAL | Status: DC
  Administered 2017-08-26 – 2017-08-27 (×2): 75 ug via ORAL
  Filled 2017-08-25 (×2): qty 1

## 2017-08-25 MED ORDER — FUROSEMIDE 10 MG/ML IJ SOLN
80.0000 mg | Freq: Two times a day (BID) | INTRAMUSCULAR | Status: DC
  Administered 2017-08-25 – 2017-08-27 (×4): 80 mg via INTRAVENOUS
  Filled 2017-08-25 (×4): qty 8

## 2017-08-25 MED ORDER — APIXABAN 2.5 MG PO TABS
2.5000 mg | ORAL_TABLET | Freq: Two times a day (BID) | ORAL | Status: DC
  Administered 2017-08-25 – 2017-08-27 (×4): 2.5 mg via ORAL
  Filled 2017-08-25 (×5): qty 1

## 2017-08-25 MED ORDER — ACETAMINOPHEN 325 MG PO TABS
650.0000 mg | ORAL_TABLET | ORAL | Status: DC | PRN
  Administered 2017-08-26: 650 mg via ORAL
  Filled 2017-08-25: qty 2

## 2017-08-25 NOTE — Addendum Note (Signed)
Encounter addended by: Sherald Hess, NP on: 08/25/2017 1:23 PM  Actions taken: LOS modified

## 2017-08-25 NOTE — ED Notes (Signed)
Lunch tray ordered 

## 2017-08-25 NOTE — ED Notes (Signed)
Patient transported to X-ray 

## 2017-08-25 NOTE — Consult Note (Signed)
Advanced Heart Failure Team Consult Note   Primary Physician: Primary Cardiologist:  Dr Aundra Dubin.   Reason for Consultation: A/C Systolic Heart Failure   HPI:    ABDULLAHI VALLONE is seen today for evaluation of heart failure at the request of Dr Maryan Rued.   JAWUAN ROBB is a 83 y.o. male with CAD s/p CABG, hypertension, hyperlipidemia and chronic systolic and diastolic heart failure who returns for followup of dyspnea and CHF.   Admitted in 1/18 with acute on chornic systolic HF complicated by cardiogenic shock. Required support with both milrinone and norepinephrine. Developed transaminitis 2/2 shock liver.  He also developed atrial fibrillation with RVR requiring amiodarone to control. Tolvaptan given for hyponatremia.  L/RHC as below. Echo 08/26/16 LVEF 10-15%, Severe LAE, RV reduced, Severe RAE, Trivial pericardial perfusion. He was diuresed and titrated off milrinone/norepinephrine.    He was admitted in 8/18 with acute on chronic systolic CHF.  Echo in 8/18 showed EF 35-40%, mildly decreased RV systolic function.   He was admitted in 12/18 with acute on chronic systolic CHF.  Echo in 12/18 showed EF 20-25%, mild LVH, PASP 37 mmHg.  This admission may have been related to high sodium intake with his diet.   Followed closely in the HF clinic and was last seen on January 4th. He was volume overloaded in the setting of high salt diet/extra fluid intake and instructed to take an extra 20 mg torsemide for 2 days. Weight at that time was 206 pounds.The last weight at home was 203 pounds.    Earlier this morning he was acutely short of breath with exertion. Says he was feeling ok last night. This morning he was short of breath at rest and called his son. He does not recall what he had eat but his son says he had fried fish with french fries. He is essentially wheel chair bound.    In the ED he was given 60 mg IV lasix. Pertinent labs include: K 4.3, Hyponatremia, creatinine 2.04, bnp 1102,  Hgb 8.7, WBC 4.4. CXR did show mild basilar atelectasis. He has been hypotensive in the ED with SBP 70-90s.   Echo 07/20/2017 Left ventricle: The cavity size was normal. Wall thickness was   increased in a pattern of mild LVH. Systolic function was   severely reduced. The estimated ejection fraction was in the   range of 20% to 25%. Diffuse hypokinesis. Doppler parameters are   consistent with a reversible restrictive pattern, indicative of   decreased left ventricular diastolic compliance and/or increased   left atrial pressure (grade 3 diastolic dysfunction). - Aortic valve: Valve area (VTI): 1.97 cm^2. Valve area (Vmax):   1.68 cm^2. Valve area (Vmean): 1.73 cm^2. - Left atrium: The atrium was severely dilated. - Right atrium: The atrium was severely dilated. - Pulmonary arteries: Systolic pressure was mildly increased. PA   peak pressure: 37 mm Hg (S).  Review of Systems: [y] = yes, '[ ]'$  = no   General: Weight gain '[ ]'$ ; Weight loss '[ ]'$ ; Anorexia '[ ]'$ ; Fatigue '[ ]'$ ; Fever '[ ]'$ ; Chills '[ ]'$ ; Weakness '[ ]'$   Cardiac: Chest pain/pressure '[ ]'$ ; Resting SOB [Y ]; Exertional SOB '[ ]'$ ; Orthopnea [Y ]; Pedal Edema [Y ]; Palpitations '[ ]'$ ; Syncope '[ ]'$ ; Presyncope '[ ]'$ ; Paroxysmal nocturnal dyspnea'[ ]'$   Pulmonary: Cough '[ ]'$ ; Wheezing'[ ]'$ ; Hemoptysis'[ ]'$ ; Sputum '[ ]'$ ; Snoring '[ ]'$   GI: Vomiting'[ ]'$ ; Dysphagia'[ ]'$ ; Melena'[ ]'$ ; Hematochezia '[ ]'$ ; Heartburn'[ ]'$ ; Abdominal pain '[ ]'$ ;  Constipation '[ ]'$ ; Diarrhea '[ ]'$ ; BRBPR '[ ]'$   GU: Hematuria'[ ]'$ ; Dysuria '[ ]'$ ; Nocturia'[ ]'$   Vascular: Pain in legs with walking [Y ]; Pain in feet with lying flat '[ ]'$ ; Non-healing sores '[ ]'$ ; Stroke '[ ]'$ ; TIA '[ ]'$ ; Slurred speech '[ ]'$ ;  Neuro: Headaches'[ ]'$ ; Vertigo'[ ]'$ ; Seizures'[ ]'$ ; Paresthesias'[ ]'$ ;Blurred vision '[ ]'$ ; Diplopia '[ ]'$ ; Vision changes '[ ]'$   Ortho/Skin: Arthritis '[ ]'$ ; Joint pain [Y ]; Muscle pain '[ ]'$ ; Joint swelling '[ ]'$ ; Back Pain [Y ]; Rash '[ ]'$   Psych: Depression'[ ]'$ ; Anxiety'[ ]'$   Heme: Bleeding problems '[ ]'$ ; Clotting disorders '[ ]'$ ; Anemia '[ ]'$     Endocrine: Diabetes '[ ]'$ ; Thyroid dysfunction'[ ]'$   Home Medications Prior to Admission medications   Medication Sig Start Date End Date Taking? Authorizing Provider  amiodarone (PACERONE) 200 MG tablet Take 1 tablet (200 mg total) by mouth daily. 07/29/17   Lelon Perla, MD  apixaban (ELIQUIS) 2.5 MG TABS tablet Take 1 tablet (2.5 mg total) 2 (two) times daily by mouth. 06/20/17 09/18/17  Larey Dresser, MD  atorvastatin (LIPITOR) 10 MG tablet Take 10 mg by mouth at bedtime.     [provider]  benzonatate (TESSALON) 100 MG capsule Take 100 mg by mouth 3 (three) times daily as needed (for coughing).     [provider]  carvedilol (COREG) 3.125 MG tablet Take 3.125 mg by mouth 2 (two) times daily with a meal.    [provider]  carvedilol (COREG) 6.25 MG tablet Take 1 tablet (6.25 mg total) by mouth 2 (two) times daily. Patient not taking: Reported on 08/19/2017 07/29/17 10/27/17  Larey Dresser, MD  digoxin (LANOXIN) 0.125 MG tablet Take 0.5 tablets (0.0625 mg total) by mouth every other day. 06/09/17   Larey Dresser, MD  esomeprazole (NEXIUM) 40 MG capsule Take 40 mg by mouth daily.      [provider]  glyBURIDE (DIABETA) 5 MG tablet Take 5 mg by mouth daily with breakfast.    [provider]  HUMULIN R 100 UNIT/ML injection USE VIA PUMP AS DIRECTED WITH V-GO MACHINE AS DIRECTED. MAX DD OF 66 UNITS 07/04/17   [provider]  Insulin Disposable Pump (V-GO 30) KIT daily.  07/04/17   [provider]  levothyroxine (SYNTHROID, LEVOTHROID) 75 MCG tablet Take 1 tablet (75 mcg total) by mouth daily before breakfast. 07/23/17   Cherene Altes, MD  losartan (COZAAR) 25 MG tablet Take 0.5 tablets (12.5 mg total) by mouth daily. 11/19/16   Shirley Friar, PA-C  polyethylene glycol (MIRALAX / Floria Raveling) packet Take 17 g by mouth daily as needed for mild constipation.     [provider]  potassium chloride SA  (K-DUR,KLOR-CON) 20 MEQ tablet Take 1 tablet (20 mEq total) by mouth daily. 06/09/17   Larey Dresser, MD  spironolactone (ALDACTONE) 25 MG tablet Take 25 mg by mouth at bedtime.     [provider]  torsemide (DEMADEX) 100 MG tablet Take 1 tablet (100 mg total) by mouth 2 (two) times daily. 05/02/17   Minus Breeding, MD  torsemide (DEMADEX) 100 MG tablet Take 100 mg by mouth 2 (two) times daily.    [provider]  vitamin C (ASCORBIC ACID) 500 MG tablet Take 500 mg by mouth 2 (two) times daily.    [provider]    Past Medical History: Past Medical History:  Diagnosis Date  . AICD (automatic cardioverter/defibrillator) present  DOI 2008;   . Arthritis    "mostly in my knees" (07/20/2017)  . Congestive heart failure, unspecified   . Coronary atherosclerosis of unspecified type of vessel, native or graft   . Hypothyroidism   . Ischemic cardiomyopathy   . Myocardial infarction [facility]) ?2005  . Other and unspecified hyperlipidemia   . Peptic ulcer, unspecified site, unspecified as acute or chronic, without mention of hemorrhage, perforation, or obstruction   . RA (rheumatoid arthritis) ([place])   . Type II diabetes mellitus ([place])   . Unspecified essential hypertension   . Ventricular tachycardia ([place])    Rx via ICD 5 /2012    Past Surgical History: Past Surgical History:  Procedure Laterality Date  . CARDIAC CATHETERIZATION N/A 09/01/2016   Procedure: Right/Left Heart Cath and Coronary/Graft Angiography;  Surgeon: Larey Dresser, MD;  Location: [place] [facility];  Service: Cardiovascular;  Laterality: N/A;  . CARPAL TUNNEL RELEASE Left 05/2008   Archie Endo 12/17/2010  . CARPAL TUNNEL RELEASE Right 03/2010   Archie Endo 04/08/2010  . CORONARY ARTERY BYPASS GRAFT  2000  . IMPLANTABLE CARDIOVERTER DEFIBRILLATOR (ICD) GENERATOR CHANGE N/A 01/25/2014   Procedure: ICD GENERATOR CHANGE;  Surgeon: Deboraha Sprang, MD;  Location: [facility] CATH LAB;  Service: Cardiovascular;   Laterality: N/A;  . INGUINAL HERNIA REPAIR Right     Family History: Family History  Problem Relation Age of Onset  . Coronary artery disease Sister   . Coronary artery disease Brother     Social History: Social History   Socioeconomic History  . Marital status: Widowed    Spouse name: Not on file  . Number of children: Not on file  . Years of education: Not on file  . Highest education level: Not on file  Social Needs  . Financial resource strain: Not on file  . Food insecurity - worry: Not on file  . Food insecurity - inability: Not on file  . Transportation needs - medical: Not on file  . Transportation needs - non-medical: Not on file  Occupational History  . Not on file  Tobacco Use  . Smoking status: Never Smoker  . Smokeless tobacco: Never Used  Substance and Sexual Activity  . Alcohol use: No  . Drug use: No  . Sexual activity: Not on file  Other Topics Concern  . Not on file  Social History Narrative   Married. Has 1 surviving child and 4 surviving grandchildren.     Allergies:  Allergies  Allergen Reactions  . Metformin And Related Other (See Comments)    "Made sugar go wild"   . Rivastigmine Nausea And Vomiting    Objective:    Vital Signs:   Temp:  [98.3 F (36.8 C)] 98.3 F (36.8 C) (01/10 1130) Pulse Rate:  [48-68] 64 (01/10 1404) Resp:  [14-23] 15 (01/10 1404) BP: (71-103)/(53-67) 90/59 (01/10 1404) SpO2:  [94 %-100 %] 99 % (01/10 1404) Weight:  [198 lb (89.8 kg)] 198 lb (89.8 kg) (01/10 1130)    Weight change: Filed Weights   08/25/17 1130  Weight: 198 lb (89.8 kg)    Intake/Output:  No intake or output data in the 24 hours ending 08/25/17 1415    Physical Exam    General:  Elderly .  No resp difficulty. Son at bed side HEENT: normal Neck: supple. JVP To jaw  . Carotids 2+ bilat; no bruits. No lymphadenopathy or thyromegaly appreciated. Cor: PMI nondisplaced. Regular rate & rhythm. No rubs, gallops or murmurs. Lungs:  clear Abdomen:  soft, nontender, distended. No hepatosplenomegaly. No bruits or masses. Good bowel sounds. Extremities: no cyanosis, clubbing, rash, R and LLE 2+ edema Neuro: alert & orientedx3, cranial nerves grossly intact. moves all 4 extremities w/o difficulty. Affect pleasant   Telemetry   NSR 60s   EKG   NSR 66 bpm   Labs   Basic Metabolic Panel: Recent Labs  Lab 08/19/17 1215 08/25/17 1140  NA 130* 124*  K 3.7 4.3  CL 93* 89*  CO2 28 23  GLUCOSE 61* 167*  BUN 45* 43*  CREATININE 1.98* 2.04*  CALCIUM 9.0 8.6*    Liver Function Tests: Recent Labs  Lab 08/25/17 1140  AST 25  ALT 23  ALKPHOS 106  BILITOT 0.9  PROT 6.8  ALBUMIN 3.6   No results for input(s): LIPASE, AMYLASE in the last 168 hours. No results for input(s): AMMONIA in the last 168 hours.  CBC: Recent Labs  Lab 08/25/17 1140  WBC 4.4  NEUTROABS 3.1  HGB 8.7*  HCT 27.0*  MCV 81.3  PLT 193    Cardiac Enzymes: No results for input(s): CKTOTAL, CKMB, CKMBINDEX, TROPONINI in the last 168 hours.  BNP: BNP (last 3 results) Recent Labs    04/02/17 0313 07/19/17 1819 08/25/17 1140  BNP 739.7* 926.3* 1,102.9*    ProBNP (last 3 results) No results for input(s): PROBNP in the last 8760 hours.   CBG: No results for input(s): GLUCAP in the last 168 hours.  Coagulation Studies: No results for input(s): LABPROT, INR in the last 72 hours.   Imaging   Dg Chest 2 View  Result Date: 08/25/2017 CLINICAL DATA:  LOW SATS,SOB,WEAK,LOW BLOOD PRESSURE EXAM: CHEST  2 VIEW COMPARISON:  08/11/2017 FINDINGS: Left-sided transvenous pacemaker leads to the right ventricle. Status post median sternotomy. Heart is enlarged and stable in configuration. Minimal bibasilar atelectasis. No focal consolidations. Trace bilateral pleural effusions. No pulmonary edema. IMPRESSION: 1. Stable cardiomegaly. 2. Mild bibasilar atelectasis. Electronically Signed   By: Nolon Nations M.D.   On: 08/25/2017 12:26       Medications:     Current Medications:    Infusions:      Patient Profile   [name] is a 83 y.o. male with CAD s/p CABG, hypertension, hyperlipidemia and chronic systolic and diastolic heart failure presenting today with sob at rest and increased leg edema.   Assessment/Plan   1. A/C Systolic/Diastolic Heart Failure  BNP elevated. ECHO EF 20-25%  Volume overloaded. Received 60 mg IV lasix in the ED. Start lasix 80 mg twice a day.  Hold arb, spiro, and carvedilol with hypotension.  2. PAF IN NSR today. Continue eliquis 2.5 mg twice a day 3. CKD Stage III Creatinine 2.04; Follow creatinine closely.  4. CAD  s/p CABG. Coronary angiography 1/18 with patent graft and no targets for revascularization.   No S/S ischemia.   - Continue ASA and statin.  5. Osteoarthritis 6.Hyponatremia- sodium 124; restrict free water. May need tolvaptan.    Admit to telemetry. 24-48 hours.   Medication concerns reviewed with patient and pharmacy team. Barriers identified: none  Length of Stay: 0  Darrick Grinder, NP  08/25/2017, 2:15 PM  Advanced Heart Failure Team Pager [phone] (M-F; 7a - 4p)  Please contact [facility] for night-coverage after hours (4p -7a ) and weekends on [email or web address]  Patient seen with NP, agree with the above note.  Patient came to ER with increased dyspnea.  On exam, he is volume overloaded with JVD and peripheral edema.  He is in NSR.  No chest pain.   1. Acute on chronic systolic CHF:  Ischemic cardiomyopathy.  Echo 08/26/16 with LVEF 10-15%, severe biatrial enlargement, decreased RV systolic function.  Most recent echo in 12/18 with EF 20-25%.  [place].  He is minimally active at baseline due to severe arthritis.  NYHA class IIIb symptoms.  He is volume overloaded on exam.  He continues to report significant dietary indiscretion (fried fish/french fries).  - Start Lasix 80 mg IV bid.  - He is significantly hyponatremic, will give a dose  of tolvaptan 15 x 1.  - Hold losartan, spironolactone, Coreg for now with soft BP.  Probably restart at low doses tomorrow if BP stable.    - Continue digoxin 0.0625 daily, need to check level.   - Wear compression stockings during the day.  - He has IVCD (164 msec).  CRT will be consideration down the road though benefit may not be as much as with true LBBB. 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  2. CKD stage III: Creatinine stable today at 2.  Watch with diuresis.  3. CAD: s/p CABG. Coronary angiography 1/18 with patent graft and no targets for revascularization.  No recent chest pain.  - Continue ASA and statin.  4. Atrial fibrillation:  NSR today.  No tachypalpitations.  - Continue amiodarone 200 mg daily.   - Continue Eliquis 2.5 mg BID (age, elevated creatinine).   5. Osteoarthritis: Knee OA significantly limits activity.  6. Hyponatremia: Hypervolemic hyponatremia, Na 124.  As above, will give a dose of tolvaptan.   Loralie Champagne 08/25/2017 3:38 PM

## 2017-08-25 NOTE — ED Notes (Addendum)
Meal order placed

## 2017-08-25 NOTE — ED Provider Notes (Signed)
[place] EMERGENCY DEPARTMENT Provider Note   CSN: [id number] Arrival date & time: 08/25/17  1126     History   Chief Complaint Chief Complaint  Patient presents with  . Shortness of Breath    HPI [name] is a 83 y.o. male.  Patient is an 83 year old male with multiple medical problems including cardiomyopathy with a recent echo in December showing an EF between 20 and 25%, chronic noncompliance with low-sodium and low fluid intake, coronary artery disease, CHF presenting today with shortness of breath.  Patient states that he woke up around 5 AM this morning and felt shortness of breath which worsened with exertion.  It was not really getting any better so he decided to come and be evaluated.  He denies any chest pain.  He did not check his weight yesterday but 2 days ago his weight was 203.  He does not think the swelling in his lower extremities has gotten any worse recently and he has been taking his medications as prescribed.  He has not taken any of his morning medications.  He sleeps with 2-3 pillows at night and denies any symptoms of orthopnea.  No cough, congestion or fever.  No abdominal pain, nausea or vomiting.   The history is provided by the patient.    Past Medical History:  Diagnosis Date  . AICD (automatic cardioverter/defibrillator) present    DOI 2008;   . Arthritis    "mostly in my knees" (07/20/2017)  . Congestive heart failure, unspecified   . Coronary atherosclerosis of unspecified type of vessel, native or graft   . Hypothyroidism   . Ischemic cardiomyopathy   . Myocardial infarction [facility]) ?2005  . Other and unspecified hyperlipidemia   . Peptic ulcer, unspecified site, unspecified as acute or chronic, without mention of hemorrhage, perforation, or obstruction   . RA (rheumatoid arthritis) ([place])   . Type II diabetes mellitus ([place])   . Unspecified essential hypertension   . Ventricular tachycardia ([place])    Rx via ICD 5 /2012      Patient Active Problem List   Diagnosis Date Noted  . CHF (congestive heart failure) ([place]) 07/19/2017  . Hyponatremia 04/02/2017  . Normocytic anemia 04/02/2017  . OA (osteoarthritis) 11/19/2016  . Paroxysmal atrial fibrillation ([place]) 09/10/2016  . CKD (chronic kidney disease), stage III ([place]) 09/10/2016  . Physical deconditioning 09/10/2016  . Single implantable cardioverter-defibrillator BSX    . Ventricular tachycardia-treated the ATP 01/29/2011  . Hx of CABG 04/17/2009  . Cardiomyopathy, ischemic 04/17/2009  . Acute on chronic systolic CHF (congestive heart failure) ([place]) 04/17/2009  . Insulin dependent diabetes mellitus ([place]) 04/12/2009  . Hyperlipidemia LDL goal <70 04/12/2009  . Essential hypertension 04/12/2009  . PUD 04/12/2009  . Rheumatoid arthritis ([place]) 04/12/2009    Past Surgical History:  Procedure Laterality Date  . CARDIAC CATHETERIZATION N/A 09/01/2016   Procedure: Right/Left Heart Cath and Coronary/Graft Angiography;  Surgeon: Larey Dresser, MD;  Location: [place] CV LAB;  Service: Cardiovascular;  Laterality: N/A;  . CARPAL TUNNEL RELEASE Left 05/2008   Archie Endo 12/17/2010  . CARPAL TUNNEL RELEASE Right 03/2010   Archie Endo 04/08/2010  . CORONARY ARTERY BYPASS GRAFT  2000  . IMPLANTABLE CARDIOVERTER DEFIBRILLATOR (ICD) GENERATOR CHANGE N/A 01/25/2014   Procedure: ICD GENERATOR CHANGE;  Surgeon: Deboraha Sprang, MD;  Location[facility];  Service: Cardiovascular;  Laterality: N/A;  . INGUINAL HERNIA REPAIR Right        Home Medications  Prior to Admission medications   Medication Sig Start Date End Date Taking? Authorizing Provider  amiodarone (PACERONE) 200 MG tablet Take 1 tablet (200 mg total) by mouth daily. 07/29/17   Lelon Perla, MD  apixaban (ELIQUIS) 2.5 MG TABS tablet Take 1 tablet (2.5 mg total) 2 (two) times daily by mouth. 06/20/17 09/18/17  Larey Dresser, MD  atorvastatin (LIPITOR) 10 MG tablet Take 10 mg by mouth at bedtime.      [provider]  benzonatate (TESSALON) 100 MG capsule Take 100 mg by mouth 3 (three) times daily as needed (for coughing).     [provider]  carvedilol (COREG) 3.125 MG tablet Take 3.125 mg by mouth 2 (two) times daily with a meal.    [provider]  carvedilol (COREG) 6.25 MG tablet Take 1 tablet (6.25 mg total) by mouth 2 (two) times daily. Patient not taking: Reported on 08/19/2017 07/29/17 10/27/17  Larey Dresser, MD  digoxin (LANOXIN) 0.125 MG tablet Take 0.5 tablets (0.0625 mg total) by mouth every other day. 06/09/17   Larey Dresser, MD  esomeprazole (NEXIUM) 40 MG capsule Take 40 mg by mouth daily.      [provider]  glyBURIDE (DIABETA) 5 MG tablet Take 5 mg by mouth daily with breakfast.    [provider]  HUMULIN R 100 UNIT/ML injection USE VIA PUMP AS DIRECTED WITH V-GO MACHINE AS DIRECTED. MAX DD OF 66 UNITS 07/04/17   [provider]  Insulin Disposable Pump (V-GO 30) KIT daily.  07/04/17   [provider]  levothyroxine (SYNTHROID, LEVOTHROID) 75 MCG tablet Take 1 tablet (75 mcg total) by mouth daily before breakfast. 07/23/17   Cherene Altes, MD  losartan (COZAAR) 25 MG tablet Take 0.5 tablets (12.5 mg total) by mouth daily. 11/19/16   Shirley Friar, PA-C  polyethylene glycol (MIRALAX / Floria Raveling) packet Take 17 g by mouth daily as needed for mild constipation.     [provider]  potassium chloride SA (K-DUR,KLOR-CON) 20 MEQ tablet Take 1 tablet (20 mEq total) by mouth daily. 06/09/17   Larey Dresser, MD  spironolactone (ALDACTONE) 25 MG tablet Take 25 mg by mouth at bedtime.     [provider]  torsemide (DEMADEX) 100 MG tablet Take 1 tablet (100 mg total) by mouth 2 (two) times daily. 05/02/17   Minus Breeding, MD  torsemide (DEMADEX) 100 MG tablet Take 100 mg by mouth 2 (two) times daily.    [provider]  vitamin C (ASCORBIC ACID) 500 MG tablet Take 500 mg by  mouth 2 (two) times daily.    [provider]    Family History Family History  Problem Relation Age of Onset  . Coronary artery disease Sister   . Coronary artery disease Brother     Social History Social History   Tobacco Use  . Smoking status: Never Smoker  . Smokeless tobacco: Never Used  Substance Use Topics  . Alcohol use: No  . Drug use: No     Allergies   Metformin and related and Rivastigmine   Review of Systems Review of Systems  All other systems reviewed and are negative.    Physical Exam Updated Vital Signs BP 98/61 (BP Location: Right Arm)   Pulse 63   Temp 98.3 F (36.8 C) (Oral)   Resp (!) 21   Ht _0  (1.778 m)   Wt 89.8 kg (198 lb)   SpO2 100%   BMI  28.41 kg/m   Physical Exam  Constitutional: He is oriented to person, place, and time. He appears well-developed and well-nourished. No distress.  HENT:  Head: Normocephalic and atraumatic.  Mouth/Throat: Oropharynx is clear and moist.  Eyes: Conjunctivae and EOM are normal. Pupils are equal, round, and reactive to light.  Neck: Normal range of motion. Neck supple.  Cardiovascular: Normal rate, regular rhythm and intact distal pulses.  No murmur heard. Pulmonary/Chest: Effort normal. No respiratory distress. He has no wheezes. He has rales in the left lower field.  Abdominal: Soft. He exhibits no distension. There is no tenderness. There is no rebound and no guarding.  Musculoskeletal: Normal range of motion. He exhibits no tenderness.       Right lower leg: He exhibits edema.       Left lower leg: He exhibits edema.  2+ pitting edema bilaterally to the mid shin  Neurological: He is alert and oriented to person, place, and time.  Skin: Skin is warm and dry. No rash noted. No erythema.  Psychiatric: He has a normal mood and affect. His behavior is normal.  Nursing note and vitals reviewed.    ED Treatments / Results  Labs (all labs ordered are listed, but only abnormal  results are displayed) Labs Reviewed  CBC WITH DIFFERENTIAL/PLATELET - Abnormal; Notable for the following components:      Result Value   RBC 3.32 (*)    Hemoglobin 8.7 (*)    HCT 27.0 (*)    All other components within normal limits  COMPREHENSIVE METABOLIC PANEL - Abnormal; Notable for the following components:   Sodium 124 (*)    Chloride 89 (*)    Glucose, Bld 167 (*)    BUN 43 (*)    Creatinine, Ser 2.04 (*)    Calcium 8.6 (*)    GFR calc non Af Amer 29 (*)    GFR calc Af Amer 34 (*)    All other components within normal limits  BRAIN NATRIURETIC PEPTIDE - Abnormal; Notable for the following components:   B Natriuretic Peptide 1,102.9 (*)    All other components within normal limits  I-STAT TROPONIN, ED    EKG  EKG Interpretation  Date/Time:  Thursday August 25 2017 11:27:11 EST Ventricular Rate:  66 PR Interval:    QRS Duration: 164 QT Interval:  500 QTC Calculation: 524 R Axis:   -84 Text Interpretation:  Sinus rhythm Nonspecific IVCD with LAD Consider anterior infarct No significant change since last tracing Confirmed by Blanchie Dessert [phone]) on 08/25/2017 11:42:24 AM       Radiology Dg Chest 2 View  Result Date: 08/25/2017 CLINICAL DATA:  LOW SATS,SOB,WEAK,LOW BLOOD PRESSURE EXAM: CHEST  2 VIEW COMPARISON:  08/11/2017 FINDINGS: Left-sided transvenous pacemaker leads to the right ventricle. Status post median sternotomy. Heart is enlarged and stable in configuration. Minimal bibasilar atelectasis. No focal consolidations. Trace bilateral pleural effusions. No pulmonary edema. IMPRESSION: 1. Stable cardiomegaly. 2. Mild bibasilar atelectasis. Electronically Signed   By: Nolon Nations M.D.   On: 08/25/2017 12:26    Procedures Procedures (including critical care time)  Medications Ordered in ED Medications  albuterol (PROVENTIL) (2.5 MG/3ML) 0.083% nebulizer solution 5 mg (not administered)     Initial Impression / Assessment and Plan / ED Course  I  have reviewed the triage vital signs and the nursing notes.  Pertinent labs & imaging results that were available during my care of the patient were reviewed by me and considered in my medical decision  making (see chart for details).     Patient with multiple medical problems presenting today with concern of worsening CHF.  Patient was seen in the cardiology office 6 days ago and at that time had some mild medication adjustments and was again encouraged to follow a low sodium and decreased fluid intake diet.  Patient is not following these.  Last night he had pizza and woke up this morning feeling short of breath.  Patient is currently satting 100% on room air.  He does have some distal rales and distal edema.  He has an EF of 20-25% so has chronically lower blood pressures but this seems to be his norm.  CBC, CMP, BNP, troponin and chest x-ray pending.  Patient has not taken any of his morning medications.  And based on his last hospitalization his weight is up from 198-203 which he says is stable over the last several weeks.  2:05 PM Labs with a stable hemoglobin and renal function.  BNP is slightly worse.  Chest x-ray without significant change.  On repeat exam patient states that his breathing has been normal.  Blood pressure has fluctuated between 70 and 90 systolic.  Patient given a dose of IV Lasix.  Discussed with cardiology to come and evaluate the patient to determine whether he would be safe for discharge versus admission. Final Clinical Impressions(s) / ED Diagnoses   Final diagnoses:  Congestive heart failure, unspecified HF chronicity, unspecified heart failure type [facility])    ED Discharge Orders    None       Blanchie Dessert, MD 08/25/17 1406

## 2017-08-25 NOTE — ED Triage Notes (Signed)
Patient awoke at 1am with difficulty breathing. Tried to sleep and manage at home but condition became worst. Upon arrival EMS noted SaO2 at 89% on room air.

## 2017-08-26 ENCOUNTER — Encounter (HOSPITAL_COMMUNITY): Payer: Self-pay | Admitting: *Deleted

## 2017-08-26 LAB — BASIC METABOLIC PANEL
Anion gap: 9 (ref 5–15)
BUN: 38 mg/dL — AB (ref 6–20)
CALCIUM: 7.8 mg/dL — AB (ref 8.9–10.3)
CHLORIDE: 97 mmol/L — AB (ref 101–111)
CO2: 22 mmol/L (ref 22–32)
CREATININE: 1.74 mg/dL — AB (ref 0.61–1.24)
GFR calc non Af Amer: 35 mL/min — ABNORMAL LOW (ref >60)
GFR, EST AFRICAN AMERICAN: 41 mL/min — AB (ref >60)
Glucose, Bld: 104 mg/dL — ABNORMAL HIGH (ref 65–99)
Potassium: 3.3 mmol/L — ABNORMAL LOW (ref 3.5–5.1)
SODIUM: 128 mmol/L — AB (ref 135–145)

## 2017-08-26 LAB — SODIUM: Sodium: 127 mmol/L — ABNORMAL LOW (ref 135–145)

## 2017-08-26 MED ORDER — SPIRONOLACTONE 12.5 MG HALF TABLET
12.5000 mg | ORAL_TABLET | Freq: Every day | ORAL | Status: DC
  Administered 2017-08-27: 12.5 mg via ORAL
  Filled 2017-08-26 (×3): qty 1

## 2017-08-26 MED ORDER — TOLVAPTAN 15 MG PO TABS: 15.0000 mg | ORAL_TABLET | Freq: Once | ORAL | Status: DC

## 2017-08-26 MED ORDER — DIPHENHYDRAMINE HCL 25 MG PO CAPS
50.0000 mg | ORAL_CAPSULE | Freq: Every evening | ORAL | Status: DC | PRN
  Administered 2017-08-26: 50 mg via ORAL
  Filled 2017-08-26: qty 2

## 2017-08-26 NOTE — Evaluation (Signed)
Physical Therapy Evaluation Patient Details Name: [name] MRN: [id number] DOB: Dec 11, 1934 Today's Date: 08/26/2017   History of Present Illness  Patient is an 83 year old male with multiple medical problems including cardiomyopathy with a recent echo in December showing an EF between 20 and 25%, chronic noncompliance with low-sodium and low fluid intake, coronary artery disease, CHF presenting today with shortness of breath.  Clinical Impression  Pt admitted with/for SOB from CHF.  Presently pt is weak and unable to transfer bed to chair without significant assist.  Pt currently limited functionally due to the problems listed below.  (see problems list.)  Pt will benefit from PT to maximize function and safety to be able to get home safely with available assist.     Follow Up Recommendations Home health PT;Supervision/Assistance - 24 hour(if pt can not self transfer he needs 24* assist)    Equipment Recommendations  None recommended by PT    Recommendations for Other Services       Precautions / Restrictions Precautions Precautions: Fall Restrictions Weight Bearing Restrictions: No      Mobility  Bed Mobility Overal bed mobility: Needs Assistance Bed Mobility: Supine to Sit;Sit to Supine     Supine to sit: Min guard(rail) Sit to supine: Supervision   General bed mobility comments: uses some momentum to get up, but not enough without rail  Transfers Overall transfer level: Needs assistance   Transfers: Squat Pivot Transfers     Squat pivot transfers: Mod assist;From elevated surface     General transfer comment: pt's scooter has a swing away arm.  He had to clear the chair arm.  He did not attain much vertical height during the transfer.  Ambulation/Gait                Stairs            Wheelchair Mobility    Modified Rankin (Stroke Patients Only)       Balance Overall balance assessment: Needs assistance Sitting-balance support: No upper  extremity supported Sitting balance-Leahy Scale: Good                                       Pertinent Vitals/Pain Pain Assessment: Faces Faces Pain Scale: Hurts little more Pain Location: knee pain Pain Descriptors / Indicators: Sore;Grimacing;Discomfort Pain Intervention(s): Monitored during session    Home Living Family/patient expects to be discharged to:: Private residence Living Arrangements: Other (Comment);Other relatives(grand daughter) Available Help at Discharge: Family;Available PRN/intermittently(all family members work and pt must be alone at times) Type of Home: House Home Access: Ramped entrance     Home Layout: One level Home Equipment: Electric scooter;Grab bars - toilet;Grab bars - tub/shower      Prior Function Level of Independence: Independent with assistive device(s)         Comments: uses scooter for most mobility in and outside.  Transfers only     Hand Dominance        Extremity/Trunk Assessment   Upper Extremity Assessment Upper Extremity Assessment: Overall WFL for tasks assessed;Generalized weakness    Lower Extremity Assessment Lower Extremity Assessment: Generalized weakness(pt unable to sustain a forceful or long gross ext bil.)       Communication   Communication: No difficulties  Cognition Arousal/Alertness: Awake/alert Behavior During Therapy: WFL for tasks assessed/performed Overall Cognitive Status: Within Functional Limits for tasks assessed  General Comments General comments (skin integrity, edema, etc.): vss though out transfers    Exercises     Assessment/Plan    PT Assessment Patient needs continued PT services  PT Problem List Decreased strength;Decreased activity tolerance;Decreased mobility;Cardiopulmonary status limiting activity;Pain       PT Treatment Interventions Functional mobility training;Therapeutic activities;Therapeutic  exercise;Patient/family education    PT Goals (Current goals can be found in the Care Plan section)  Acute Rehab PT Goals Patient Stated Goal: home as soon as possible PT Goal Formulation: With patient Time For Goal Achievement: 09/09/17 Potential to Achieve Goals: Good    Frequency Min 3X/week   Barriers to discharge   pt has to be alone for periods of hours while family works    Co-evaluation               AM-PAC PT "6 Clicks" Daily Activity  Outcome Measure Difficulty turning over in bed (including adjusting bedclothes, sheets and blankets)?: None Difficulty moving from lying on back to sitting on the side of the bed? : Unable Difficulty sitting down on and standing up from a chair with arms (e.g., wheelchair, bedside commode, etc,.)?: Unable Help needed moving to and from a bed to chair (including a wheelchair)?: A Lot Help needed walking in hospital room?: Total Help needed climbing 3-5 steps with a railing? : Total 6 Click Score: 10    End of Session   Activity Tolerance: Patient tolerated treatment well Patient left: in bed;with call bell/phone within reach;with family/visitor present Nurse Communication: Mobility status PT Visit Diagnosis: Other abnormalities of gait and mobility (R26.89);Muscle weakness (generalized) (M62.81)    Time: 3710-6269 PT Time Calculation (min) (ACUTE ONLY): 22 min   Charges:   PT Evaluation $PT Eval Moderate Complexity: 1 Mod     PT G Codes:        Sep 04, 2017  McCoole Bing, PT [phone]  (pager)  Eliseo Gum [place] September 04, 2017, 5:17 PM

## 2017-08-26 NOTE — Care Management Note (Signed)
Case Management Note  Patient Details  Name: [name] MRN: [id number] Date of Birth: 1935/03/26  Subjective/Objective:                  82 y.o.malewith CAD s/p CABG, hypertension, hyperlipidemia and chronic systolic and diastolic heart failure who returns for followup of dyspnea and CHF. From home.  Action/Plan: Admit status INPATIENT (A/C SYSTOLIC HF); anticipate discharge RESUME HOME HEALTH.   Expected Discharge Date:  (unknown)               Expected Discharge Plan:  Home w Home Health Services  In-House Referral:     Discharge planning Services  CM Consult  Post Acute Care Choice:  Resumption of Svcs/PTA Provider  HH Arranged:    HH Agency:  [facility]  Status of Service:  In process, will continue to follow  If discussed at Long Length of Stay Meetings, dates discussed:    Additional Comments:  Oletta Cohn, RN 08/26/2017, 10:04 AM

## 2017-08-26 NOTE — ED Notes (Signed)
Heart Healthy Diet ordered for Lunch. 

## 2017-08-26 NOTE — Progress Notes (Signed)
Advanced Heart Failure Rounding Note  PCP:  Primary Cardiologist: Dr Shirlee Latch  Subjective:    SBP 80-100's. Creatinine and Na improved. I/Os don't appear accurate, he says that he diuresed a lot and is breathing better today. No weight this am.  No CP or SOB overnight. No dizziness or lightheadedness. Says he slept well, but restless per nursing staff. Wants to go home.  Objective:   Weight Range: 198 lb (89.8 kg) Body mass index is 28.41 kg/m.   Vital Signs:   Temp:  [98.3 F (36.8 C)] 98.3 F (36.8 C) (01/10 1130) Pulse Rate:  [48-74] 74 (01/11 0830) Resp:  [14-26] 16 (01/11 0648) BP: (71-109)/(40-67) 90/58 (01/11 0500) SpO2:  [93 %-100 %] 100 % (01/11 0830) Weight:  [198 lb (89.8 kg)] 198 lb (89.8 kg) (01/10 1130)    Weight change: Filed Weights   08/25/17 1130  Weight: 198 lb (89.8 kg)    Intake/Output:   Intake/Output Summary (Last 24 hours) at 08/26/2017 0845 Last data filed at 08/25/2017 2353 Gross per 24 hour  Intake 222 ml  Output 1075 ml  Net -853 ml      Physical Exam    General:  Elderly. No resp difficulty HEENT: Normal Neck: Supple. JVP to jawline. Carotids 2+ bilat; no bruits. No lymphadenopathy or thyromegaly appreciated. Cor: PMI nondisplaced. Regular rate & rhythm. No rubs, gallops or murmurs. Lungs: Clear Abdomen: Soft, nontender, nondistended. No hepatosplenomegaly. No bruits or masses. Good bowel sounds. Extremities: No cyanosis, clubbing, rash, R and LLE 1-2+ edema  Neuro: Alert & orientedx3, cranial nerves grossly intact. moves all 4 extremities w/o difficulty. Affect pleasant. forgetful   Telemetry   SR with IVCD, HR 70's with PVC's. (3-10/min). Personally reviewed.  EKG    No new tracings.  Labs    CBC Recent Labs    08/25/17 1140  WBC 4.4  NEUTROABS 3.1  HGB 8.7*  HCT 27.0*  MCV 81.3  PLT 193   Basic Metabolic Panel Recent Labs    54/62/70 2127 08/26/17 0226  NA 125* 128*  K 3.6 3.3*  CL 93* 97*  CO2 22  22  GLUCOSE 161* 104*  BUN 41* 38*  CREATININE 1.91* 1.74*  CALCIUM 7.8* 7.8*   Liver Function Tests Recent Labs    08/25/17 1140  AST 25  ALT 23  ALKPHOS 106  BILITOT 0.9  PROT 6.8  ALBUMIN 3.6   No results for input(s): LIPASE, AMYLASE in the last 72 hours. Cardiac Enzymes No results for input(s): CKTOTAL, CKMB, CKMBINDEX, TROPONINI in the last 72 hours.  BNP: BNP (last 3 results) Recent Labs    04/02/17 0313 07/19/17 1819 08/25/17 1140  BNP 739.7* 926.3* 1,102.9*    ProBNP (last 3 results) No results for input(s): PROBNP in the last 8760 hours.   D-Dimer No results for input(s): DDIMER in the last 72 hours. Hemoglobin A1C No results for input(s): HGBA1C in the last 72 hours. Fasting Lipid Panel No results for input(s): CHOL, HDL, LDLCALC, TRIG, CHOLHDL, LDLDIRECT in the last 72 hours. Thyroid Function Tests No results for input(s): TSH, T4TOTAL, T3FREE, THYROIDAB in the last 72 hours.  Invalid input(s): FREET3  Other results:   Imaging    Dg Chest 2 View  Result Date: 08/25/2017 CLINICAL DATA:  LOW SATS,SOB,WEAK,LOW BLOOD PRESSURE EXAM: CHEST  2 VIEW COMPARISON:  08/11/2017 FINDINGS: Left-sided transvenous pacemaker leads to the right ventricle. Status post median sternotomy. Heart is enlarged and stable in configuration. Minimal bibasilar atelectasis. No focal  consolidations. Trace bilateral pleural effusions. No pulmonary edema. IMPRESSION: 1. Stable cardiomegaly. 2. Mild bibasilar atelectasis. Electronically Signed   By: Norva Pavlov M.D.   On: 08/25/2017 12:26      Medications:     Scheduled Medications: . amiodarone  200 mg Oral Daily  . apixaban  2.5 mg Oral BID  . atorvastatin  10 mg Oral QHS  . digoxin  0.0625 mg Oral QODAY  . furosemide  80 mg Intravenous BID  . glyBURIDE  5 mg Oral Q breakfast  . levothyroxine  75 mcg Oral QAC breakfast  . pantoprazole  80 mg Oral Q1200  . sodium chloride flush  3 mL Intravenous Q12H  .  tolvaptan  15 mg Oral Q24H     Infusions: . sodium chloride       PRN Medications:  sodium chloride, acetaminophen, benzonatate, diphenhydrAMINE, ondansetron (ZOFRAN) IV, sodium chloride flush    Patient Profile   Raymond Pickrell Jonesis a 83 y.o.malewith CAD s/p CABG, hypertension, hyperlipidemia and chronic systolic and diastolic heart failure. Presented 1/10 with sob at rest and increased leg edema.   Assessment/Plan   1. A/C Systolic/Diastolic Heart Failure, ICM, EF 20-25% ECHO 12/18 EF 20-25%, has [employer] ICD BNP elevated on admit. Volume overloaded. NYHA III.  - Continue 80 mg IV lasix BID. Takes 100 mg torsemide BID at home. - Continue to hold arb and carvedilol with hypotension. SBP 90-100's overnight.  - Can restart low dose spironolactone.  - Will give one more dose tolvaptan today.  - Working on approval for cardiomems. May consider CRT-D in future with IVCD. 2. PAF - Still in NSR 60-70's - Continue eliquis 2.5 mg twice a day - Continue digoxin 0.0625 mg daily - Continue amiodarone 200 mg daily - Dig level 0.7 3. CKD Stage III, baseline 1.8-2.1 - Creatinine improved. 1.74 this am. - Monitor with daily BMP 4. CAD  s/p CABG. Coronary angiography 1/18 with patent graft and no targets for revascularization. - No s/s ischemia. - Continue statin. On eliquis, no ASA 5. Osteoarthritis - Has tylenol prn. - Wheelchair bound at home. 6.Hyponatremia- sodium 124; restrict free water. Received tolvaptan x1. - Improved to 128 this am => to get one more dose tolvaptan today.   Medication concerns reviewed with patient and pharmacy team. Barriers identified: None.  Length of Stay: 1  Alford Highland, NP  08/26/2017, 8:45 AM  Advanced Heart Failure Team Pager [phone] (M-F; 7a - 4p)  Please contact [facility] for night-coverage after hours (4p -7a ) and weekends on [email or web address]  Patient seen with NP, agree with the above note.  He remains volume overloaded  today.  Think he diuresed reasonably based on his report though I/Os not accurate.  Creatinine better today. Na up to 128.   1. Acute on chronic systolic CHF: Ischemic cardiomyopathy. Echo 08/26/16 with LVEF 10-15%, severe biatrial enlargement, decreased RV systolic function. Most recent echo in 12/18 with EF20-25%. [employer] ICD. He is minimally active at baseline due to severe arthritis. NYHA class IIIb symptoms. He continues to report significant dietary indiscretion (fried fish/french fries). He is still volume overloaded on exam.  - Continue Lasix 80 mg IV bid today.  - Na remains low but up to 128.  Give 1 more dose tolvaptan today.  - Hold losartan, Coreg for now with soft BP.  Restart spironolactone 12.5 daily today.    - Continue digoxin 0.0625 daily, level ok.   -Wear compression stockings during the day.  -He  has IVCD (164 msec). CRT will be consideration down the road though benefit may not be as much as with true LBBB. 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  2. CKD stage III: Creatinine improved with diuresis.  3. CAD: s/p CABG. Coronary angiography 1/18 with patent graft and no targets for revascularization. No recent chest pain.  - Continue ASA and statin.  4. Atrial fibrillation: NSR today. No tachypalpitations.  - Continue amiodarone 200 mg daily.  - Continue Eliquis 2.5 mg BID (age, elevated creatinine).  5. Osteoarthritis: Knee OA significantly limits activity.  6. Hyponatremia: Hypervolemic hyponatremia, Na 124 initially, up to 128 today.  Will give another dose of tolvaptan.   Marca Ancona 08/26/2017 1:47 PM

## 2017-08-26 NOTE — ED Notes (Signed)
Attempted to call report x 1  

## 2017-08-26 NOTE — Plan of Care (Signed)
  Activity: Capacity to carry out activities will improve 08/26/2017 1513 - Not Progressing by Loel Lofty, RN

## 2017-08-26 NOTE — Progress Notes (Signed)
Per order, printed and reviewed the "Tolvaptan Medication Guide" with pt and son at bedside.

## 2017-08-26 NOTE — ED Notes (Signed)
Patient requested RN remove pants and underwear, belongings placed in bag at bedside.

## 2017-08-27 LAB — BASIC METABOLIC PANEL
ANION GAP: 9 (ref 5–15)
BUN: 36 mg/dL — AB (ref 6–20)
CO2: 25 mmol/L (ref 22–32)
Calcium: 8.6 mg/dL — ABNORMAL LOW (ref 8.9–10.3)
Chloride: 91 mmol/L — ABNORMAL LOW (ref 101–111)
Creatinine, Ser: 1.86 mg/dL — ABNORMAL HIGH (ref 0.61–1.24)
GFR calc Af Amer: 38 mL/min — ABNORMAL LOW (ref >60)
GFR, EST NON AFRICAN AMERICAN: 33 mL/min — AB (ref >60)
Glucose, Bld: 199 mg/dL — ABNORMAL HIGH (ref 65–99)
POTASSIUM: 3.8 mmol/L (ref 3.5–5.1)
SODIUM: 125 mmol/L — AB (ref 135–145)

## 2017-08-27 MED ORDER — SPIRONOLACTONE 25 MG PO TABS: 12.5000 mg | ORAL_TABLET | Freq: Every day | ORAL | 2 refills | Status: AC

## 2017-08-27 MED ORDER — TORSEMIDE 20 MG PO TABS: 100.0000 mg | ORAL_TABLET | Freq: Two times a day (BID) | ORAL | Status: DC

## 2017-08-27 MED ORDER — CARVEDILOL 3.125 MG PO TABS: 3.1250 mg | ORAL_TABLET | Freq: Two times a day (BID) | ORAL | Status: DC

## 2017-08-27 NOTE — Evaluation (Signed)
Occupational Therapy Evaluation Patient Details Name: [name] MRN: [id number] DOB: [birth year] Today's Date: 08/27/2017    History of Present Illness Patient is an 83 year old male with multiple medical problems including cardiomyopathy with a recent echo in December showing an EF between 20 and 25%, chronic noncompliance with low-sodium and low fluid intake, coronary artery disease, CHF presenting today with shortness of breath.   Clinical Impression   PTA Pt mod I for transfers to scooter and mod I for ADL with shower chair and scooter. Pt currently presents with below deficits (see OT problem list) and increased need for assist during ADLs and transfers. He is currently mod A for LB ADL and transfers. Pt is set to dc today and so should work with [facility] to maximize safety and independence in home environment.     Follow Up Recommendations  Home health OT    Equipment Recommendations  None recommended by OT    Recommendations for Other Services       Precautions / Restrictions Precautions Precautions: Fall Restrictions Weight Bearing Restrictions: No      Mobility Bed Mobility Overal bed mobility: Needs Assistance Bed Mobility: Supine to Sit;Sit to Supine     Supine to sit: Min guard(use of rail to assist) Sit to supine: Supervision   General bed mobility comments: uses some momentum to get up, but not enough without rail  Transfers Overall transfer level: Needs assistance   Transfers: Squat Pivot Transfers     Squat pivot transfers: Mod assist;From elevated surface     General transfer comment: Pt requires mod A and vc for body mechanics to achieve max vertical clearance for simulation of getting into scooter    Balance Overall balance assessment: Needs assistance Sitting-balance support: No upper extremity supported Sitting balance-Leahy Scale: Good                                     ADL either performed or assessed with clinical  judgement   ADL Overall ADL's : Needs assistance/impaired Eating/Feeding: Modified independent;Sitting;Bed level   Grooming: Set up;Sitting   Upper Body Bathing: Set up;Sitting   Lower Body Bathing: Set up;Sitting/lateral leans Lower Body Bathing Details (indicate cue type and reason): Pt able to bring feet across to knees for LB Upper Body Dressing : Set up   Lower Body Dressing: Moderate assistance;With caregiver independent assisting;Sit to/from stand Lower Body Dressing Details (indicate cue type and reason): Pt able to bring feet across to knees for socks but requires assist for sit<>stand for pulling on underwear/pants etc Toilet Transfer: Moderate assistance;Squat-pivot;BSC Toilet Transfer Details (indicate cue type and reason): mod A for power up and assist for weight transfer Toileting- Clothing Manipulation and Hygiene: Set up;Sitting/lateral lean   Tub/ Shower Transfer: Moderate assistance   Functional mobility during ADLs: (Pt transfers only at baseline)       Vision Patient Visual Report: No change from baseline       Perception     Praxis      Pertinent Vitals/Pain Pain Assessment: Faces Faces Pain Scale: Hurts little more Pain Location: knee pain Pain Descriptors / Indicators: Sore;Grimacing;Discomfort Pain Intervention(s): Monitored during session;Repositioned     Hand Dominance Right   Extremity/Trunk Assessment Upper Extremity Assessment Upper Extremity Assessment: Generalized weakness   Lower Extremity Assessment Lower Extremity Assessment: Generalized weakness;Defer to PT evaluation       Communication Communication Communication: No difficulties   Cognition  Arousal/Alertness: Awake/alert Behavior During Therapy: WFL for tasks assessed/performed Overall Cognitive Status: Within Functional Limits for tasks assessed                                     General Comments  Pt's son present throughout session    Exercises      Shoulder Instructions      Home Living Family/patient expects to be discharged to:: Private residence Living Arrangements: Other (Comment);Other relatives(grand daughter + 2 great grandsons) Available Help at Discharge: Family;Available PRN/intermittently(all family members work and pt must be alone at times) Type of Home: House Home Access: Ramped entrance     Home Layout: One level     Bathroom Shower/Tub: Producer, television/film/video: Handicapped height Bathroom Accessibility: Yes   Home Equipment: Electric scooter;Grab bars - toilet;Grab bars - tub/shower;Shower seat;Hand held shower head          Prior Functioning/Environment Level of Independence: Independent with assistive device(s)        Comments: uses scooter for most mobility in and outside.  Transfers only        OT Problem List: Decreased activity tolerance;Impaired balance (sitting and/or standing);Decreased safety awareness;Decreased knowledge of use of DME or AE      OT Treatment/Interventions:      OT Goals(Current goals can be found in the care plan section) Acute Rehab OT Goals Patient Stated Goal: home as soon as possible OT Goal Formulation: With patient/family Time For Goal Achievement: 09/10/17 Potential to Achieve Goals: Good  OT Frequency:     Barriers to D/C:            Co-evaluation              AM-PAC PT "6 Clicks" Daily Activity     Outcome Measure Help from another person eating meals?: None Help from another person taking care of personal grooming?: A Little Help from another person toileting, which includes using toliet, bedpan, or urinal?: A Lot Help from another person bathing (including washing, rinsing, drying)?: A Little Help from another person to put on and taking off regular upper body clothing?: None Help from another person to put on and taking off regular lower body clothing?: A Lot 6 Click Score: 18   End of Session Equipment Utilized During  Treatment: Gait belt Nurse Communication: Mobility status  Activity Tolerance: Patient tolerated treatment well Patient left: in bed;with call bell/phone within reach;with bed alarm set;with family/visitor present;with SCD's reapplied  OT Visit Diagnosis: Other abnormalities of gait and mobility (R26.89);Muscle weakness (generalized) (M62.81)                Time: 1594-5859 OT Time Calculation (min): 15 min Charges:  OT General Charges $OT Visit: 1 Visit OT Evaluation $OT Eval Moderate Complexity: 1 Mod G-Codes:     Sherryl Manges OTR/L [phone]  Evern Bio Justine Dines 08/27/2017, 3:16 PM

## 2017-08-27 NOTE — Progress Notes (Signed)
Second page to CHF team regarding discharge  Pt dressed and eager to leave

## 2017-08-27 NOTE — Progress Notes (Signed)
Pt requesting to leave, family at bedside  Paged PA for CHF team regarding discharge orders

## 2017-08-27 NOTE — Discharge Instructions (Signed)
Limit fluid intake to 1.8 liters per day, which is about 7.5 cups.  Wear compression stockings. Limit sodium intake.

## 2017-08-27 NOTE — Progress Notes (Signed)
Pt discharge education provided at bedside Pt has all belongings  Pt IV discontinued, catheter intact and telemetry removed  Pt discharged via wheelchair with nurse staff

## 2017-08-27 NOTE — Progress Notes (Signed)
Pt son called requesting update  Update provided per pt request

## 2017-08-27 NOTE — Discharge Summary (Signed)
Discharge Summary    Patient ID: [name],  MRN: [id number], DOB/AGE: [birth year] 83 y.o.  Admit date: 08/25/2017 Discharge date: 08/27/2017  Primary Care Provider: Myer Peer Primary Cardiologist: Kirk Ruths, MD  Discharge Diagnoses    Active Problems:   Acute on chronic systolic heart failure (HCC)   Allergies Allergies  Allergen Reactions  . Metformin And Related Other (See Comments)    "Made sugar go wild"   . Rivastigmine Nausea And Vomiting    NO EXELON PATCHES!!    Diagnostic Studies/Procedures    none _____________   History of Present Illness     Raymond Gelin Jonesis a 83 y.o.malewith CAD s/p CABG, hypertension, hyperlipidemia and chronic systolic and diastolic heart failure who returns for followup of dyspnea and CHF.   Admitted in 1/18 with acute on chornic systolic HF complicated by cardiogenic shock. Required support with both milrinone and norepinephrine. Developed transaminitis 2/2 shock liver. He also developed atrial fibrillation with RVR requiring amiodarone to control. Tolvaptan given for hyponatremia. L/RHC as below. Echo 08/26/16 LVEF 10-15%, Severe LAE, RV reduced, Severe RAE, Trivial pericardial perfusion. He was diuresed and titrated off milrinone/norepinephrine.   He was admitted in 8/18 with acute on chronic systolic CHF. Echo in 8/18 showed EF 35-40%, mildly decreased RV systolic function.   He was admitted in 12/18 with acute on chronic systolic CHF. Echo in 12/18 showed EF 20-25%, mild LVH, PASP 37 mmHg. This admission may have been related to high sodium intake with his diet.   Followed closely in the HF clinic and was last seen on January 4th. He was volume overloaded in the setting of high salt diet/extra fluid intake and instructed to take an extra 20 mg torsemide for 2 days. Weight at that time was 206 pounds.The last weight at home was 203 pounds.    Earlier this morning he was acutely short of breath with  exertion. Says he was feeling ok last night. This morning he was short of breath at rest and called his son. He does not recall what he had eat but his son says he had fried fish with french fries. He is essentially wheel chair bound.    In the ED he was given 60 mg IV lasix. Pertinent labs include: K 4.3, Hyponatremia, creatinine 2.04, bnp 1102, Hgb 8.7, WBC 4.4. CXR did show mild basilar atelectasis. He has been hypotensive in the ED with SBP 70-90s.    Hospital Course     Consultants: none   1.Acute on chronic systolic CHF: Ischemic cardiomyopathy. Echo 08/26/16 with LVEF 10-15%, severe biatrial enlargement, decreased RV systolic function. Most recent echo in 12/18 with EF20-25%. [place]. He is minimally active at baseline and is wheelchair-bound due to severe arthritis. NYHA classIIIbsymptoms at admission.He continues to report significant dietary indiscretion (fried fish/french fries). He has diuresed well but still has mild volume overload on exam (improved from admission significantly). He really wants to go home today.   -He got IV Lasix this morning, will transition back to torsemide 100 mg bid in afternoon.  -He will stay off losartan and stay on lower spironolactone dose, 12.5 daily.  I will restart Coreg at low dose 3.125 mg bid today.    - Continue digoxin 0.0625 daily,level ok.   -Wear compression stockings during the day.  -He has IVCD (132mec). CRT will be considerationdown the roadthough benefit may not be as much as with true LBBB. 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 I think that this admission was driven by dietary indiscretion so we talked at length about sodium avoidance.  2. CKD stage NLZ:[id number] stable with diuresis.  3. CAD: s/p CABG. Coronary angiography 1/18 with patent graft and no targets for revascularization. No recent chest pain.  -  Continue ASA and statin.  4. Atrial fibrillation: NSR today. No tachypalpitations.  - Continue amiodarone 200 mg daily.  - Continue Eliquis 2.5 mg BID (age, elevated creatinine).  5. Osteoarthritis: Knee OA significantly limits activity. 6. Hyponatremia: Hypervolemic hyponatremia, he got tolvaptan in the hospital.  Needs to limit po fluid intake at home < 1.8 L/day.  7.  Disposition: Home today.  Followup CHF clinic 10 days.  Meds for home: digoxin 0.0625 daily, torsemide 100 mg bid, spironolactone 12.5 daily, Coreg 3.125 mg bid, Eliquis 2.5 bid, amiodarone 200 daily, KCL 20 daily, atorvastatin 10 daily.   Patient has been seen by Dr. Aundra Dubin today and deemed ready for discharge home. All follow up appointments have been scheduled. Discharge medications are listed below. _____________  Discharge Vitals Blood pressure 106/60, pulse 79, temperature 98.2 F (36.8 C), temperature source Oral, resp. rate 18, height '5\' 10"'$  (1.778 m), weight 204 lb 9.4 oz (92.8 kg), SpO2 98 %.  Filed Weights   08/25/17 1130 08/26/17 1448 08/27/17 0545  Weight: 198 lb (89.8 kg) 218 lb 0.6 oz (98.9 kg) 204 lb 9.4 oz (92.8 kg)    Labs & Radiologic Studies    CBC Recent Labs    08/25/17 1140  WBC 4.4  NEUTROABS 3.1  HGB 8.7*  HCT 27.0*  MCV 81.3  PLT 024   Basic Metabolic Panel Recent Labs    08/26/17 0226 08/26/17 0726 08/27/17 0344  NA 128* 127* 125*  K 3.3*  --  3.8  CL 97*  --  91*  CO2 22  --  25  GLUCOSE 104*  --  199*  BUN 38*  --  36*  CREATININE 1.74*  --  1.86*  CALCIUM 7.8*  --  8.6*   Liver Function Tests Recent Labs    08/25/17 1140  AST 25  ALT 23  ALKPHOS 106  BILITOT 0.9  PROT 6.8  ALBUMIN 3.6   No results for input(s): LIPASE, AMYLASE in the last 72 hours. Cardiac Enzymes No results for input(s): CKTOTAL, CKMB, CKMBINDEX, TROPONINI in the last 72 hours. BNP Invalid input(s): POCBNP D-Dimer No results for input(s): DDIMER in the last 72 hours. Hemoglobin  A1C No results for input(s): HGBA1C in the last 72 hours. Fasting Lipid Panel No results for input(s): CHOL, HDL, LDLCALC, TRIG, CHOLHDL, LDLDIRECT in the last 72 hours. Thyroid Function Tests No results for input(s): TSH, T4TOTAL, T3FREE, THYROIDAB in the last 72 hours.  Invalid input(s): FREET3 _____________  Dg Chest 2 View  Result Date: 08/25/2017 CLINICAL DATA:  LOW SATS,SOB,WEAK,LOW BLOOD PRESSURE EXAM: CHEST  2 VIEW COMPARISON:  08/11/2017 FINDINGS: Left-sided transvenous pacemaker leads to the right ventricle. Status post median sternotomy. Heart is enlarged and stable in configuration. Minimal bibasilar atelectasis. No focal consolidations. Trace bilateral pleural effusions. No pulmonary edema. IMPRESSION: 1. Stable cardiomegaly. 2. Mild bibasilar atelectasis. Electronically Signed   By: Nolon Nations M.D.   On: 08/25/2017 12:26   Dg Chest Portable 1 View  Result Date: 08/11/2017 CLINICAL DATA:  Shortness of breath, nausea/vomiting, dizziness. Hypotension. EXAM: PORTABLE CHEST 1 VIEW COMPARISON:  07/19/2017 FINDINGS: Cardiomegaly with pulmonary  vascular congestion. No frank interstitial edema. No pleural effusion or pneumothorax. Left subclavian ICD.  Postsurgical changes related to prior CABG. IMPRESSION: Cardiomegaly with pulmonary vascular congestion. No frank interstitial edema. Electronically Signed   By: Julian Hy M.D.   On: 08/11/2017 21:04   Disposition   Pt is being discharged home today in good condition.  Follow-up Plans & Appointments    Follow-up Information    Myer Peer, MD Follow up.   Specialty:  Family Medicine Contact information: [place] [phone] [facility] Follow up.   Specialty:  Cardiology Why:  Keep your appointment on January 25th at 11:30 for close follow up.  Contact information: [address] [id number] [place] [phone]         Discharge Instructions    (HEART FAILURE PATIENTS) Call MD:  Anytime you have any of the following symptoms: 1) 3 pound weight gain in 24 hours or 5 pounds in 1 week 2) shortness of breath, with or without a dry hacking cough 3) swelling in the hands, feet or stomach 4) if you have to sleep on extra pillows at night in order to breathe.   Complete by:  As directed    Diet - low sodium heart healthy   Complete by:  As directed    Discharge instructions   Complete by:  As directed    Limit fluids to 1.8L/day, that's about 7.5 cups per day. Limit sodium intake. See attached information. Use compression stockings.   Increase activity slowly   Complete by:  As directed       Discharge Medications   Allergies as of 08/27/2017      Reactions   Metformin And Related Other (See Comments)   "Made sugar go wild"   Rivastigmine Nausea And Vomiting   NO EXELON PATCHES!!      Medication List    STOP taking these medications   losartan 25 MG tablet Commonly known as:  COZAAR     TAKE these medications   amiodarone 200 MG tablet Commonly known as:  PACERONE Take 1 tablet (200 mg total) by mouth daily.   apixaban 2.5 MG Tabs tablet Commonly known as:  ELIQUIS Take 1 tablet (2.5 mg total) 2 (two) times daily by mouth.   benzonatate 100 MG capsule Commonly known as:  TESSALON Take 100 mg by mouth 3 (three) times daily as needed (for coughing).   carvedilol 3.125 MG tablet Commonly known as:  COREG Take 3.125 mg by mouth 2 (two) times daily with a meal.   digoxin 0.125 MG tablet Commonly known as:  LANOXIN Take 0.5 tablets (0.0625 mg total) by mouth every other day.   glyBURIDE 5 MG tablet Commonly known as:  DIABETA Take 5 mg by mouth daily with breakfast.   HUMULIN R 100 units/mL injection Generic drug:  insulin regular USE VIA PUMP AS DIRECTED WITH V-GO MACHINE AS DIRECTED. MAX DD OF 66 UNITS   levothyroxine 75 MCG tablet Commonly  known as:  SYNTHROID, LEVOTHROID Take 1 tablet (75 mcg total) by mouth daily before breakfast.   LIPITOR 10 MG tablet Generic drug:  atorvastatin Take 10 mg by mouth at bedtime.   NEXIUM 40 MG capsule Generic drug:  esomeprazole Take 40 mg by mouth daily.   polyethylene glycol packet Commonly known as:  MIRALAX / GLYCOLAX Take 17 g by mouth daily as  needed for mild constipation.   potassium chloride SA 20 MEQ tablet Commonly known as:  K-DUR,KLOR-CON Take 1 tablet (20 mEq total) by mouth daily.   spironolactone 25 MG tablet Commonly known as:  ALDACTONE Take 0.5 tablets (12.5 mg total) by mouth daily. Start taking on:  08/28/2017 What changed:    how much to take  when to take this   torsemide 100 MG tablet Commonly known as:  DEMADEX Take 1 tablet (100 mg total) by mouth 2 (two) times daily.   V-GO 30 Kit daily.   vitamin C 500 MG tablet Commonly known as:  ASCORBIC ACID Take 500 mg by mouth 2 (two) times daily.          Outstanding Labs/Studies     Duration of Discharge Encounter   Greater than 30 minutes including physician time.  Signed, Daune Perch NP 08/27/2017, 3:33 PM

## 2017-08-27 NOTE — Progress Notes (Signed)
Patient ID: [name], male   DOB: [birth year], 83 y.o.   MRN: [id number]     Advanced Heart Failure Rounding Note  PCP:  Primary Cardiologist: Dr Shirlee Latch  Subjective:    SBP 90s-110s. He diuresed well, weight down.  Feels better, really wants to go home. Creatinine fairly stable.    Objective:   Weight Range: 204 lb 9.4 oz (92.8 kg) Body mass index is 29.36 kg/m.   Vital Signs:   Temp:  [97.6 F (36.4 C)-98.2 F (36.8 C)] 98.2 F (36.8 C) (01/12 0858) Pulse Rate:  [70-80] 79 (01/12 0858) Resp:  [15-20] 18 (01/12 1141) BP: (95-110)/(56-65) 95/56 (01/12 0858) SpO2:  [97 %-100 %] 98 % (01/12 1141) Weight:  [204 lb 9.4 oz (92.8 kg)-218 lb 0.6 oz (98.9 kg)] 204 lb 9.4 oz (92.8 kg) (01/12 0545) Last BM Date: 08/25/17(per pt)  Weight change: Filed Weights   08/25/17 1130 08/26/17 1448 08/27/17 0545  Weight: 198 lb (89.8 kg) 218 lb 0.6 oz (98.9 kg) 204 lb 9.4 oz (92.8 kg)    Intake/Output:   Intake/Output Summary (Last 24 hours) at 08/27/2017 1145 Last data filed at 08/27/2017 1144 Gross per 24 hour  Intake 480 ml  Output 3725 ml  Net -3245 ml      Physical Exam    General: NAD Neck: JVP 8-9 cm, no thyromegaly or thyroid nodule.  Lungs: Mild crackles at bases bilaterally CV: Lateral PMI.  Heart regular S1/S2, no S3/S4, no murmur.  1+ ankle edema.   Abdomen: Soft, nontender, no hepatosplenomegaly, no distention.  Skin: Intact without lesions or rashes.  Neurologic: Alert and oriented x 3.  Psych: Normal affect. Extremities: No clubbing or cyanosis.  HEENT: Normal.   Telemetry   NSR 70s. Personally reviewed.  EKG    No new tracings.  Labs    CBC Recent Labs    08/25/17 1140  WBC 4.4  NEUTROABS 3.1  HGB 8.7*  HCT 27.0*  MCV 81.3  PLT 193   Basic Metabolic Panel Recent Labs    95/62/13 0226 08/26/17 0726 08/27/17 0344  NA 128* 127* 125*  K 3.3*  --  3.8  CL 97*  --  91*  CO2 22  --  25  GLUCOSE 104*  --  199*  BUN 38*  --  36*    CREATININE 1.74*  --  1.86*  CALCIUM 7.8*  --  8.6*   Liver Function Tests Recent Labs    08/25/17 1140  AST 25  ALT 23  ALKPHOS 106  BILITOT 0.9  PROT 6.8  ALBUMIN 3.6   No results for input(s): LIPASE, AMYLASE in the last 72 hours. Cardiac Enzymes No results for input(s): CKTOTAL, CKMB, CKMBINDEX, TROPONINI in the last 72 hours.  BNP: BNP (last 3 results) Recent Labs    04/02/17 0313 07/19/17 1819 08/25/17 1140  BNP 739.7* 926.3* 1,102.9*    ProBNP (last 3 results) No results for input(s): PROBNP in the last 8760 hours.   D-Dimer No results for input(s): DDIMER in the last 72 hours. Hemoglobin A1C No results for input(s): HGBA1C in the last 72 hours. Fasting Lipid Panel No results for input(s): CHOL, HDL, LDLCALC, TRIG, CHOLHDL, LDLDIRECT in the last 72 hours. Thyroid Function Tests No results for input(s): TSH, T4TOTAL, T3FREE, THYROIDAB in the last 72 hours.  Invalid input(s): FREET3  Other results:   Imaging    No results found.   Medications:     Scheduled Medications: . amiodarone  200 mg  Oral Daily  . apixaban  2.5 mg Oral BID  . atorvastatin  10 mg Oral QHS  . carvedilol  3.125 mg Oral BID WC  . digoxin  0.0625 mg Oral QODAY  . glyBURIDE  5 mg Oral Q breakfast  . levothyroxine  75 mcg Oral QAC breakfast  . pantoprazole  80 mg Oral Q1200  . sodium chloride flush  3 mL Intravenous Q12H  . spironolactone  12.5 mg Oral Daily  . torsemide  100 mg Oral BID    Infusions: . sodium chloride      PRN Medications: sodium chloride, acetaminophen, benzonatate, diphenhydrAMINE, ondansetron (ZOFRAN) IV, sodium chloride flush    Patient Profile   Raymond Bakken Jonesis a 82 y.o.malewith CAD s/p CABG, hypertension, hyperlipidemia and chronic systolic and diastolic heart failure. Presented 1/10 with sob at rest and increased leg edema.   Assessment/Plan   1. Acute on chronic systolic CHF: Ischemic cardiomyopathy. Echo 08/26/16 with LVEF  10-15%, severe biatrial enlargement, decreased RV systolic function. Most recent echo in 12/18 with EF20-25%. [employer] ICD. He is minimally active at baseline and is wheelchair-bound due to severe arthritis. NYHA class IIIb symptoms at admission. He continues to report significant dietary indiscretion (fried fish/french fries). He has diuresed well but still has mild volume overload on exam (improved from admission significantly). He really wants to go home today.   - He got IV Lasix this morning, will transition back to torsemide 100 mg bid in afternoon.  - He will stay off losartan and stay on lower spironolactone dose, 12.5 daily.  I will restart Coreg at low dose 3.125 mg bid today.     - Continue digoxin 0.0625 daily, level ok.   -Wear compression stockings during the day.  -He has IVCD (164 msec). CRT will be consideration down the road though benefit may not be as much as with true LBBB. 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  - He could probably use 1 more day of IV diuresis but really wants to get home. I think that this admission was driven by dietary indiscretion so we talked at length about sodium avoidance.  2. CKD stage III: Creatinine stable with diuresis.  3. CAD: s/p CABG. Coronary angiography 1/18 with patent graft and no targets for revascularization. No recent chest pain.  - Continue ASA and statin.  4. Atrial fibrillation: NSR today. No tachypalpitations.  - Continue amiodarone 200 mg daily.  - Continue Eliquis 2.5 mg BID (age, elevated creatinine).  5. Osteoarthritis: Knee OA significantly limits activity.  6. Hyponatremia: Hypervolemic hyponatremia, he got tolvaptan in the hospital.  Needs to limit po fluid intake at home < 1.8 L/day.  7.  Disposition: Home today.  Followup CHF clinic 10 days.  Meds for home: digoxin 0.0625 daily, torsemide 100 mg bid, spironolactone 12.5 daily, Coreg 3.125 mg bid, Eliquis 2.5 bid, amiodarone 200 daily, KCL 20 daily,  atorvastatin 10 daily.   Marca Ancona 08/27/2017 11:45 AM

## 2017-09-01 IMAGING — US US ABDOMEN LIMITED
1 series · 14 of 20 positions shown · non-contrast
Comparison: CT 03/01/2010.

CLINICAL DATA: Elevated liver function tests.

EXAM:
US ABDOMEN LIMITED - RIGHT UPPER QUADRANT

[Series 1: us abdomen limited · 0.28mm/px · 14 of 20 slices shown]
[im 1/20]
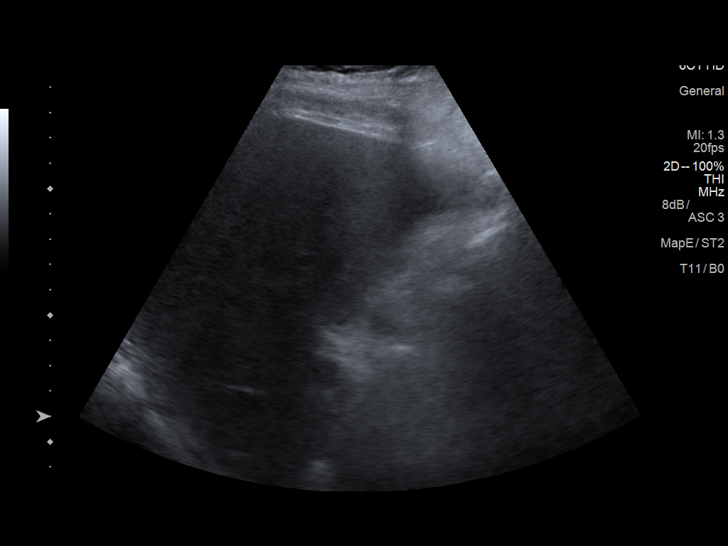
[im 3/20]
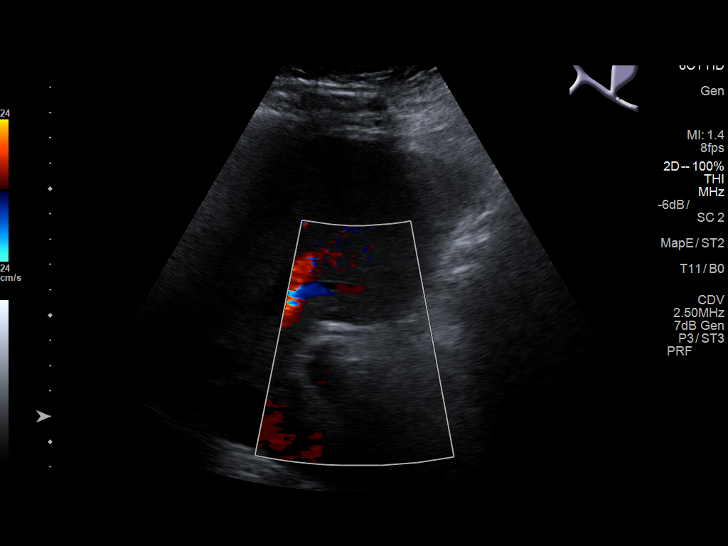
[im 4/20]
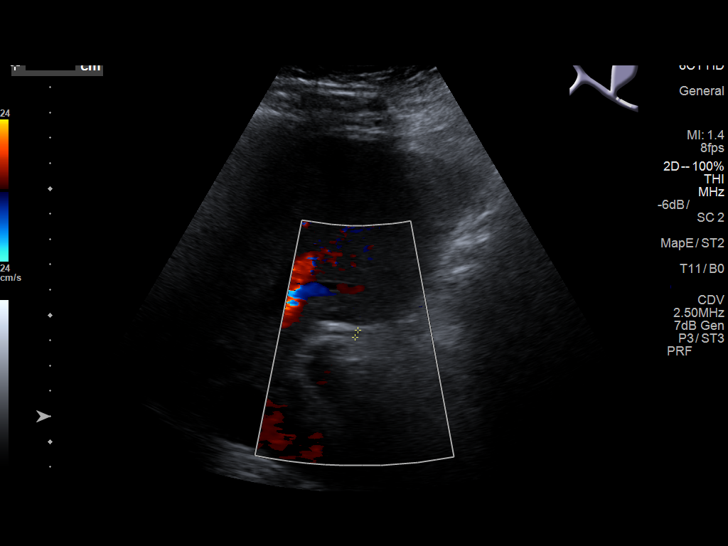
[im 6/20]
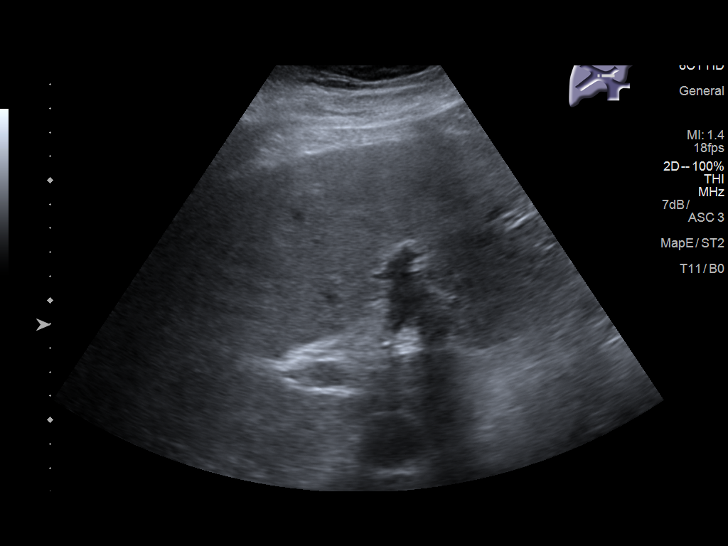
[im 7/20]
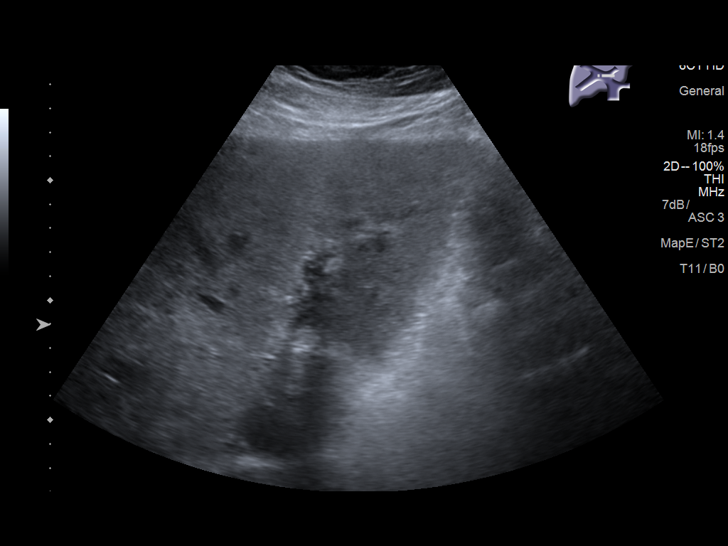
[im 8/20]
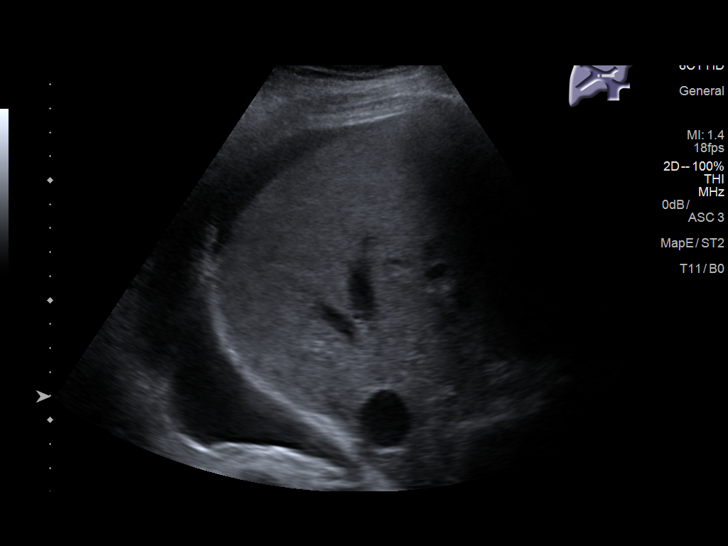
[im 10/20]
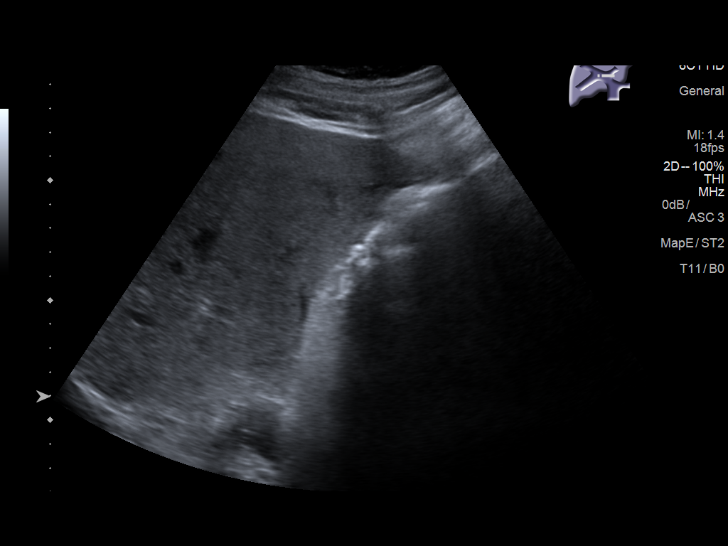
[im 11/20]
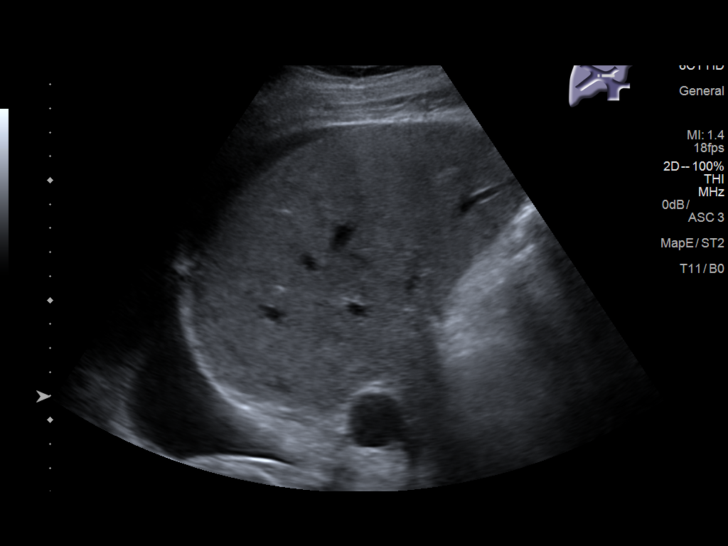
[im 13/20]
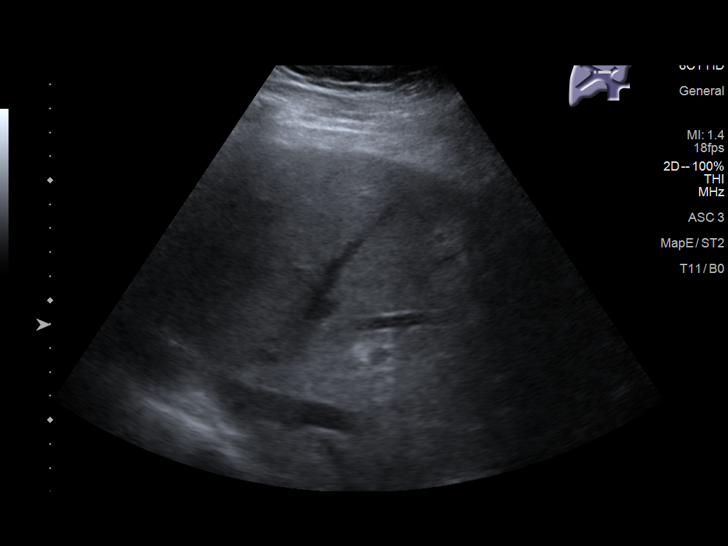
[im 14/20]
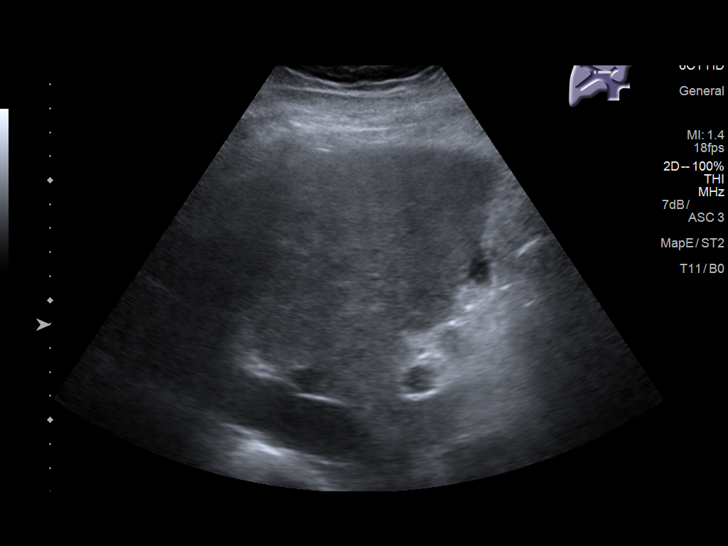
[im 16/20]
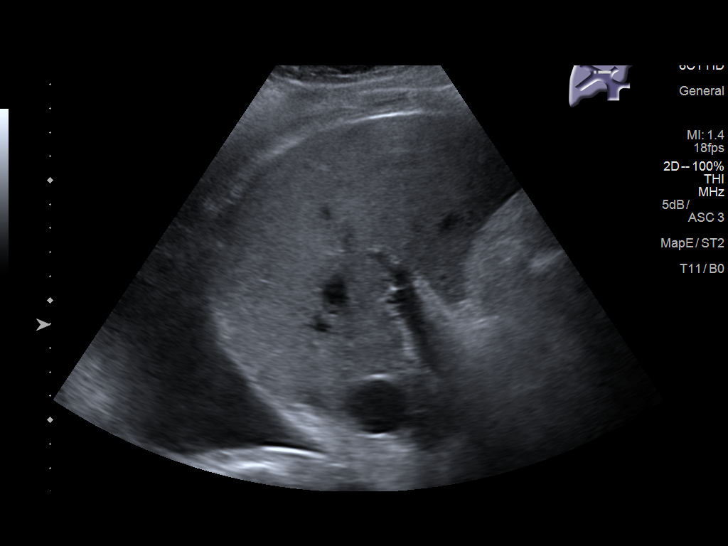
[im 17/20]
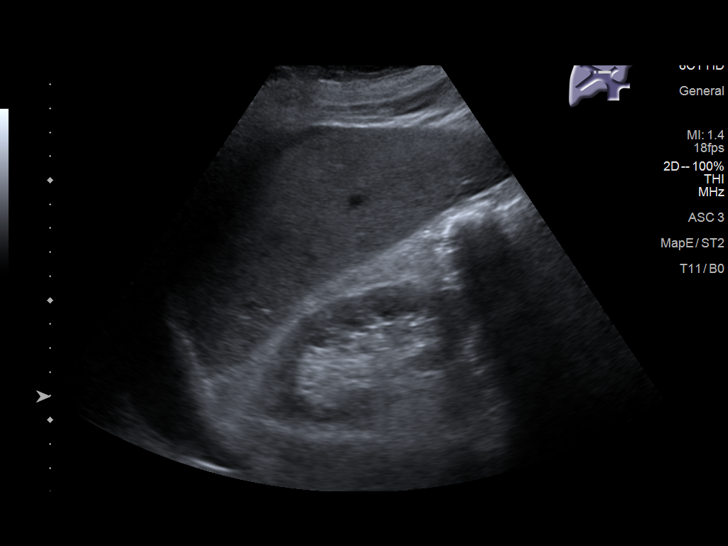
[im 18/20]
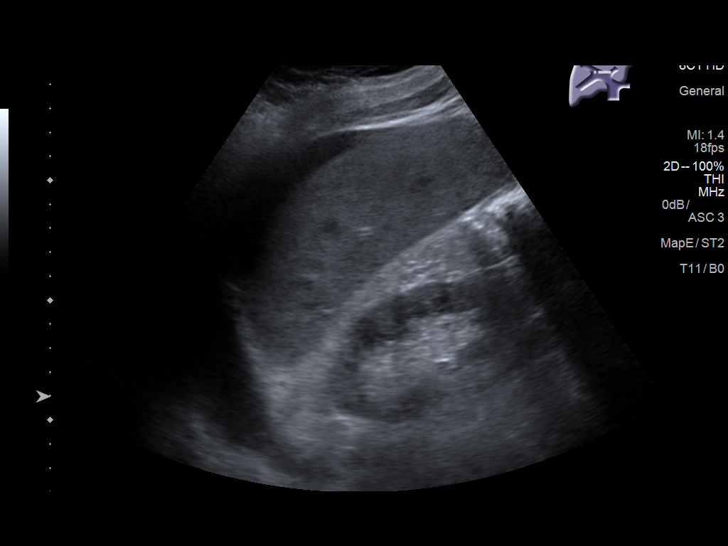
[im 20/20]
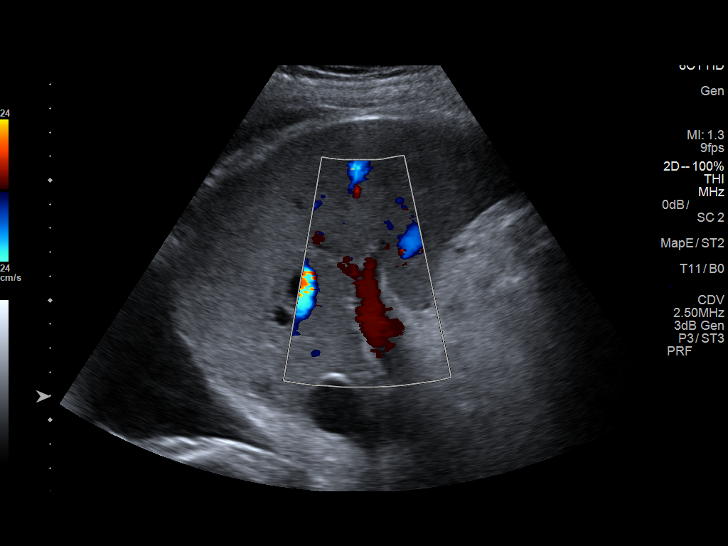

[14 of 20 positions shown; findings below may reference images not displayed]

FINDINGS: Gallbladder:

Cholecystectomy.

Common bile duct:

Diameter: 3.0 mm

Liver:

There is echogenic consistent with fatty infiltration and/or
hepatocellular disease. No focal hepatic abnormality identified.
Ascites and right pleural effusion noted.
IMPRESSION: 1. Cholecystectomy.  No biliary distention.

2. Liver is echogenic consistent fatty infiltration and/or
hepatocellular disease. No focal hepatic abnormality identified.

3. Ascites and right pleural effusion noted.

## 2017-09-05 ENCOUNTER — Other Ambulatory Visit (HOSPITAL_COMMUNITY): Payer: Self-pay | Admitting: Internal Medicine

## 2017-09-09 ENCOUNTER — Inpatient Hospital Stay (HOSPITAL_COMMUNITY)
Admission: EM | Admit: 2017-09-09 | Discharge: 2017-09-13 | DRG: 291 | Disposition: A | Discharge status: Skilled Nursing Facility | Payer: Medicare Other | Attending: Cardiology | Admitting: Cardiology

## 2017-09-09 ENCOUNTER — Other Ambulatory Visit: Payer: Self-pay

## 2017-09-09 ENCOUNTER — Emergency Department (HOSPITAL_COMMUNITY): Payer: Medicare Other

## 2017-09-09 ENCOUNTER — Encounter (HOSPITAL_COMMUNITY): Payer: Self-pay | Admitting: Emergency Medicine

## 2017-09-09 ENCOUNTER — Encounter (HOSPITAL_COMMUNITY): Payer: Medicare Other

## 2017-09-09 DIAGNOSIS — I440 Atrioventricular block, first degree: Secondary | ICD-10-CM | POA: Diagnosis present

## 2017-09-09 DIAGNOSIS — E876 Hypokalemia: Secondary | ICD-10-CM | POA: Diagnosis not present

## 2017-09-09 DIAGNOSIS — I130 Hypertensive heart and chronic kidney disease with heart failure and stage 1 through stage 4 chronic kidney disease, or unspecified chronic kidney disease: Principal | ICD-10-CM | POA: Diagnosis present

## 2017-09-09 DIAGNOSIS — Z79890 Hormone replacement therapy (postmenopausal): Secondary | ICD-10-CM | POA: Diagnosis not present

## 2017-09-09 DIAGNOSIS — Z993 Dependence on wheelchair: Secondary | ICD-10-CM

## 2017-09-09 DIAGNOSIS — Z515 Encounter for palliative care: Secondary | ICD-10-CM

## 2017-09-09 DIAGNOSIS — N183 Chronic kidney disease, stage 3 (moderate): Secondary | ICD-10-CM | POA: Diagnosis present

## 2017-09-09 DIAGNOSIS — E039 Hypothyroidism, unspecified: Secondary | ICD-10-CM | POA: Diagnosis present

## 2017-09-09 DIAGNOSIS — Z8249 Family history of ischemic heart disease and other diseases of the circulatory system: Secondary | ICD-10-CM | POA: Diagnosis not present

## 2017-09-09 DIAGNOSIS — N179 Acute kidney failure, unspecified: Secondary | ICD-10-CM

## 2017-09-09 DIAGNOSIS — M069 Rheumatoid arthritis, unspecified: Secondary | ICD-10-CM | POA: Diagnosis present

## 2017-09-09 DIAGNOSIS — I5023 Acute on chronic systolic (congestive) heart failure: Secondary | ICD-10-CM | POA: Diagnosis present

## 2017-09-09 DIAGNOSIS — E1122 Type 2 diabetes mellitus with diabetic chronic kidney disease: Secondary | ICD-10-CM | POA: Diagnosis present

## 2017-09-09 DIAGNOSIS — Z7189 Other specified counseling: Secondary | ICD-10-CM

## 2017-09-09 DIAGNOSIS — M170 Bilateral primary osteoarthritis of knee: Secondary | ICD-10-CM | POA: Diagnosis present

## 2017-09-09 DIAGNOSIS — I480 Paroxysmal atrial fibrillation: Secondary | ICD-10-CM | POA: Diagnosis present

## 2017-09-09 DIAGNOSIS — I5033 Acute on chronic diastolic (congestive) heart failure: Secondary | ICD-10-CM | POA: Diagnosis not present

## 2017-09-09 DIAGNOSIS — I5043 Acute on chronic combined systolic (congestive) and diastolic (congestive) heart failure: Secondary | ICD-10-CM | POA: Diagnosis present

## 2017-09-09 DIAGNOSIS — Z7901 Long term (current) use of anticoagulants: Secondary | ICD-10-CM

## 2017-09-09 DIAGNOSIS — Z95810 Presence of automatic (implantable) cardiac defibrillator: Secondary | ICD-10-CM | POA: Diagnosis not present

## 2017-09-09 DIAGNOSIS — N184 Chronic kidney disease, stage 4 (severe): Secondary | ICD-10-CM

## 2017-09-09 DIAGNOSIS — I255 Ischemic cardiomyopathy: Secondary | ICD-10-CM | POA: Diagnosis present

## 2017-09-09 DIAGNOSIS — I2510 Atherosclerotic heart disease of native coronary artery without angina pectoris: Secondary | ICD-10-CM | POA: Diagnosis present

## 2017-09-09 DIAGNOSIS — F0390 Unspecified dementia without behavioral disturbance: Secondary | ICD-10-CM | POA: Diagnosis present

## 2017-09-09 DIAGNOSIS — I252 Old myocardial infarction: Secondary | ICD-10-CM | POA: Diagnosis not present

## 2017-09-09 DIAGNOSIS — Z951 Presence of aortocoronary bypass graft: Secondary | ICD-10-CM | POA: Diagnosis not present

## 2017-09-09 DIAGNOSIS — M549 Dorsalgia, unspecified: Secondary | ICD-10-CM | POA: Diagnosis present

## 2017-09-09 DIAGNOSIS — Z794 Long term (current) use of insulin: Secondary | ICD-10-CM | POA: Diagnosis not present

## 2017-09-09 DIAGNOSIS — E785 Hyperlipidemia, unspecified: Secondary | ICD-10-CM | POA: Diagnosis present

## 2017-09-09 LAB — CBC
HEMATOCRIT: 28.6 % — AB (ref 39.0–52.0)
Hemoglobin: 9.1 g/dL — ABNORMAL LOW (ref 13.0–17.0)
MCH: 26 pg (ref 26.0–34.0)
MCHC: 31.8 g/dL (ref 30.0–36.0)
MCV: 81.7 fL (ref 78.0–100.0)
Platelets: 194 10*3/uL (ref 150–400)
RBC: 3.5 MIL/uL — ABNORMAL LOW (ref 4.22–5.81)
RDW: 14.9 % (ref 11.5–15.5)
WBC: 6.6 10*3/uL (ref 4.0–10.5)

## 2017-09-09 LAB — I-STAT TROPONIN, ED: Troponin i, poc: 0.06 ng/mL (ref 0.00–0.08)

## 2017-09-09 LAB — GLUCOSE, CAPILLARY
GLUCOSE-CAPILLARY: 195 mg/dL — AB (ref 65–99)
Glucose-Capillary: 193 mg/dL — ABNORMAL HIGH (ref 65–99)

## 2017-09-09 LAB — URINALYSIS, ROUTINE W REFLEX MICROSCOPIC
BILIRUBIN URINE: NEGATIVE
Glucose, UA: NEGATIVE mg/dL
Hgb urine dipstick: NEGATIVE
KETONES UR: NEGATIVE mg/dL
Leukocytes, UA: NEGATIVE
NITRITE: NEGATIVE
PROTEIN: NEGATIVE mg/dL
SPECIFIC GRAVITY, URINE: 1.006 (ref 1.005–1.030)
pH: 7 (ref 5.0–8.0)

## 2017-09-09 LAB — BASIC METABOLIC PANEL
Anion gap: 13 (ref 5–15)
BUN: 38 mg/dL — AB (ref 6–20)
CALCIUM: 8.6 mg/dL — AB (ref 8.9–10.3)
CO2: 24 mmol/L (ref 22–32)
Chloride: 91 mmol/L — ABNORMAL LOW (ref 101–111)
Creatinine, Ser: 1.81 mg/dL — ABNORMAL HIGH (ref 0.61–1.24)
GFR calc Af Amer: 39 mL/min — ABNORMAL LOW (ref >60)
GFR calc non Af Amer: 34 mL/min — ABNORMAL LOW (ref >60)
Glucose, Bld: 167 mg/dL — ABNORMAL HIGH (ref 65–99)
POTASSIUM: 4.3 mmol/L (ref 3.5–5.1)
Sodium: 128 mmol/L — ABNORMAL LOW (ref 135–145)

## 2017-09-09 LAB — BRAIN NATRIURETIC PEPTIDE: B NATRIURETIC PEPTIDE 5: 1105.4 pg/mL — AB (ref 0.0–100.0)

## 2017-09-09 MED ORDER — FUROSEMIDE 10 MG/ML IJ SOLN
80.0000 mg | Freq: Two times a day (BID) | INTRAMUSCULAR | Status: DC
  Administered 2017-09-09 – 2017-09-13 (×9): 80 mg via INTRAVENOUS
  Filled 2017-09-09 (×9): qty 8

## 2017-09-09 MED ORDER — APIXABAN 2.5 MG PO TABS
2.5000 mg | ORAL_TABLET | Freq: Two times a day (BID) | ORAL | Status: DC
  Administered 2017-09-09 – 2017-09-13 (×8): 2.5 mg via ORAL
  Filled 2017-09-09 (×10): qty 1

## 2017-09-09 MED ORDER — INSULIN ASPART 100 UNIT/ML ~~LOC~~ SOLN
0.0000 [IU] | Freq: Three times a day (TID) | SUBCUTANEOUS | Status: DC
  Administered 2017-09-09 – 2017-09-10 (×3): 4 [IU] via SUBCUTANEOUS
  Administered 2017-09-10 – 2017-09-11 (×3): 3 [IU] via SUBCUTANEOUS

## 2017-09-09 MED ORDER — ACETAMINOPHEN 325 MG PO TABS
650.0000 mg | ORAL_TABLET | Freq: Four times a day (QID) | ORAL | Status: DC | PRN
  Administered 2017-09-09 – 2017-09-12 (×3): 650 mg via ORAL
  Filled 2017-09-09 (×3): qty 2

## 2017-09-09 MED ORDER — FUROSEMIDE 10 MG/ML IJ SOLN: 40.0000 mg | Freq: Once | INTRAMUSCULAR | Status: DC

## 2017-09-09 MED ORDER — POLYETHYLENE GLYCOL 3350 17 G PO PACK
17.0000 g | PACK | Freq: Every day | ORAL | Status: DC | PRN
  Administered 2017-09-10 – 2017-09-12 (×3): 17 g via ORAL
  Filled 2017-09-09 (×5): qty 1

## 2017-09-09 MED ORDER — GLYBURIDE 5 MG PO TABS
5.0000 mg | ORAL_TABLET | Freq: Every day | ORAL | Status: DC
  Administered 2017-09-10 – 2017-09-13 (×4): 5 mg via ORAL
  Filled 2017-09-09 (×4): qty 1

## 2017-09-09 MED ORDER — BENZONATATE 100 MG PO CAPS: 100.0000 mg | ORAL_CAPSULE | Freq: Three times a day (TID) | ORAL | Status: DC | PRN

## 2017-09-09 MED ORDER — LEVOTHYROXINE SODIUM 75 MCG PO TABS
75.0000 ug | ORAL_TABLET | Freq: Every day | ORAL | Status: DC
  Administered 2017-09-10 – 2017-09-13 (×4): 75 ug via ORAL
  Filled 2017-09-09 (×6): qty 1

## 2017-09-09 MED ORDER — ATORVASTATIN CALCIUM 10 MG PO TABS
10.0000 mg | ORAL_TABLET | Freq: Every day | ORAL | Status: DC
  Administered 2017-09-09 – 2017-09-12 (×4): 10 mg via ORAL
  Filled 2017-09-09 (×5): qty 1

## 2017-09-09 MED ORDER — DIGOXIN 125 MCG PO TABS
0.0625 mg | ORAL_TABLET | ORAL | Status: DC
  Administered 2017-09-09 – 2017-09-13 (×3): 0.0625 mg via ORAL
  Filled 2017-09-09 (×5): qty 1

## 2017-09-09 MED ORDER — AMIODARONE HCL 200 MG PO TABS
200.0000 mg | ORAL_TABLET | Freq: Every day | ORAL | Status: DC
  Administered 2017-09-10 – 2017-09-13 (×4): 200 mg via ORAL
  Filled 2017-09-09 (×4): qty 1

## 2017-09-09 NOTE — ED Notes (Signed)
Heart healthy ordered 

## 2017-09-09 NOTE — H&P (Signed)
Advanced Heart Failure Team History and Physical Note   Primary Physician:   Primary Cardiologist: Dr Aundra Dubin   Reason for Admission: A/C Systolic Heart Failure    HPI:   [name] is an 83 year old with history of CAD/CABG, htn, hyperlipidemia, memory loss, back pain, deconditioning, PAF, CKD Stage IIII, and chronic systolic /diastoic heart failure.   He had 3 admits in 6045 with a/c systolic failure. He was last admitted 4/09 with a/c systolic heart failure. Diuresed with IV lasix and transitioned to torsemide 100 mg twice a day. Discharge weight was 204 pounds.   Presented to [facility] ED via EMS with increased sob. SOB with exertion. No family present. I called his son and he says he has been providing all medications and trying to limit fluid intake. Requires assistance with ADLs. CXR with vascular congestion. Pertinent admission labs: BNP 1105, troponin 0.06, K 4.3, creatinine 1.8, and WBC 6.6. UA negative.     Review of Systems: [y] = yes, _0  = no   General: Weight gain _1 ; Weight loss _2 ; Anorexia _3 ; Fatigue [Y ]; Fever _4 ; Chills _5 ; Weakness [ Y]  Cardiac: Chest pain/pressure _6 ; Resting SOB [Y ]; Exertional SOB [Y ]; Orthopnea [Y ]; Pedal Edema [ Y]; Palpitations _7 ; Syncope _8 ; Presyncope _9 ; Paroxysmal nocturnal dyspnea_10   Pulmonary: Cough _11 ; Wheezing_12 ; Hemoptysis_13 ; Sputum _14 ; Snoring _15   GI: Vomiting_16 ; Dysphagia_17 ; Melena_18 ; Hematochezia _19 ; Heartburn_20 ; Abdominal pain _21 ; Constipation _22 ; Diarrhea _23 ; BRBPR _24   GU: Hematuria_25 ; Dysuria _26 ; Nocturia_27   Vascular: Pain in legs with walking [Y ]; Pain in feet with lying flat _28 ; Non-healing sores _29 ; Stroke _30 ; TIA _31 ; Slurred speech _32 ;  Neuro: Headaches_33 ; Vertigo_34 ; Seizures_35 ; Paresthesias_36 ;Blurred vision _37 ; Diplopia _38 ; Vision changes _39   Ortho/Skin: Arthritis _40 ; Joint pain _41 ; Muscle pain _42 ; Joint swelling _43 ; Back Pain [Y ]; Rash _44   Psych: Depression_45 ; Anxiety[Y ]    Heme: Bleeding problems _46 ; Clotting disorders _47 ; Anemia _48   Endocrine: Diabetes [Y ]; Thyroid dysfunction_49    Home Medications Prior to Admission medications   Medication Sig Start Date End Date Taking? Authorizing Provider  amiodarone (PACERONE) 200 MG tablet Take 1 tablet (200 mg total) by mouth daily. 07/29/17   Lelon Perla, MD  apixaban (ELIQUIS) 2.5 MG TABS tablet Take 1 tablet (2.5 mg total) 2 (two) times daily by mouth. 06/20/17 09/18/17  Larey Dresser, MD  atorvastatin (LIPITOR) 10 MG tablet Take 10 mg by mouth at bedtime.     [provider]  benzonatate (TESSALON) 100 MG capsule Take 100 mg by mouth 3 (three) times daily as needed (for coughing).     [provider]  carvedilol (COREG) 3.125 MG tablet Take 3.125 mg by mouth 2 (two) times daily with a meal.    [provider]  [place] 125 MCG tablet TAKE 1 TABLET DAILY 09/05/17   Marcial Pless, Shaune Pascal, MD  digoxin (LANOXIN) 0.125 MG tablet Take 0.5 tablets (0.0625 mg total) by mouth every other day. 06/09/17   Larey Dresser, MD  esomeprazole (NEXIUM) 40 MG capsule Take 40 mg by mouth daily.      [provider]  glyBURIDE (DIABETA) 5 MG tablet Take 5 mg by mouth daily with  breakfast.    [provider]  HUMULIN R 100 UNIT/ML injection USE VIA PUMP AS DIRECTED WITH V-GO MACHINE AS DIRECTED. MAX DD OF 66 UNITS 07/04/17   [provider]  Insulin Disposable Pump (V-GO 30) KIT daily.  07/04/17   [provider]  levothyroxine (SYNTHROID, LEVOTHROID) 75 MCG tablet Take 1 tablet (75 mcg total) by mouth daily before breakfast. 07/23/17   Cherene Altes, MD  polyethylene glycol (MIRALAX / Floria Raveling) packet Take 17 g by mouth daily as needed for mild constipation.     [provider]  potassium chloride SA (K-DUR,KLOR-CON) 20 MEQ tablet Take 1 tablet (20 mEq total) by mouth daily. 06/09/17   Larey Dresser, MD  spironolactone (ALDACTONE) 25 MG tablet Take 0.5  tablets (12.5 mg total) by mouth daily. 08/28/17   Daune Perch, NP  torsemide (DEMADEX) 100 MG tablet Take 1 tablet (100 mg total) by mouth 2 (two) times daily. 05/02/17   Minus Breeding, MD  vitamin C (ASCORBIC ACID) 500 MG tablet Take 500 mg by mouth 2 (two) times daily.    [provider]    Past Medical History: Past Medical History:  Diagnosis Date  . AICD (automatic cardioverter/defibrillator) present    DOI 2008;   . Arthritis    "mostly in my knees" (07/20/2017)  . Congestive heart failure, unspecified   . Coronary atherosclerosis of unspecified type of vessel, native or graft   . Hypothyroidism   . Ischemic cardiomyopathy   . Myocardial infarction [facility]) ?2005  . Other and unspecified hyperlipidemia   . Peptic ulcer, unspecified site, unspecified as acute or chronic, without mention of hemorrhage, perforation, or obstruction   . RA (rheumatoid arthritis) ([place])   . Type II diabetes mellitus ([place])   . Unspecified essential hypertension   . Ventricular tachycardia ([place])    Rx via ICD 5 /2012    Past Surgical History: Past Surgical History:  Procedure Laterality Date  . CARDIAC CATHETERIZATION N/A 09/01/2016   Procedure: Right/Left Heart Cath and Coronary/Graft Angiography;  Surgeon: Larey Dresser, MD;  Location: [facility] CV LAB;  Service: Cardiovascular;  Laterality: N/A;  . CARPAL TUNNEL RELEASE Left 05/2008   Archie Endo 12/17/2010  . CARPAL TUNNEL RELEASE Right 03/2010   Archie Endo 04/08/2010  . CORONARY ARTERY BYPASS GRAFT  2000  . IMPLANTABLE CARDIOVERTER DEFIBRILLATOR (ICD) GENERATOR CHANGE N/A 01/25/2014   Procedure: ICD GENERATOR CHANGE;  Surgeon: Deboraha Sprang, MD;  Location: [facility] CATH LAB;  Service: Cardiovascular;  Laterality: N/A;  . INGUINAL HERNIA REPAIR Right     Family History:  Family History  Problem Relation Age of Onset  . Coronary artery disease Sister   . Coronary artery disease Brother     Social History: Social History   Socioeconomic  History  . Marital status: Widowed    Spouse name: None  . Number of children: None  . Years of education: None  . Highest education level: None  Social Needs  . Financial resource strain: None  . Food insecurity - worry: None  . Food insecurity - inability: None  . Transportation needs - medical: None  . Transportation needs - non-medical: None  Occupational History  . None  Tobacco Use  . Smoking status: Never Smoker  . Smokeless tobacco: Never Used  Substance and Sexual Activity  . Alcohol use: No  . Drug use: No  . Sexual activity: None  Other Topics Concern  . None  Social History Narrative   Married. Has  1 surviving child and 4 surviving grandchildren.     Allergies:  Allergies  Allergen Reactions  . Metformin And Related Other (See Comments)    "Made sugar go wild"   . Rivastigmine Nausea And Vomiting    NO EXELON PATCHES!!    Objective:    Vital Signs:   Temp:  [98.6 F (37 C)] 98.6 F (37 C) (01/25 0532) Pulse Rate:  [68] 68 (01/25 0532) Resp:  [18] 18 (01/25 0532) BP: (107)/(68) 107/68 (01/25 0532) SpO2:  [98 %] 98 % (01/25 0532)   There were no vitals filed for this visit.   Physical Exam     General:  Elderly.  No respiratory difficulty HEENT: Normal Neck: Supple. JVP to jaw . Carotids 2+ bilat; no bruits. No lymphadenopathy or thyromegaly appreciated. Cor: PMI nondisplaced. Regular rate & rhythm. No rubs, gallops or murmurs. Lungs: Crackles in the bases. On room air  Abdomen: Soft, nontender, nondistended. No hepatosplenomegaly. No bruits or masses. Good bowel sounds. Extremities: No cyanosis, clubbing, rash, R and LLE 3+ edema Neuro: Alert & oriented x 3, cranial nerves grossly intact. moves all 4 extremities w/o difficulty. Affect pleasant.   Telemetry   NSR 60s  EKG   NSR 68 bpm   Labs     Basic Metabolic Panel: Recent Labs  Lab 09/09/17 0525  NA 128*  K 4.3  CL 91*  CO2 24  GLUCOSE 167*  BUN 38*  CREATININE 1.81*    CALCIUM 8.6*    Liver Function Tests: No results for input(s): AST, ALT, ALKPHOS, BILITOT, PROT, ALBUMIN in the last 168 hours. No results for input(s): LIPASE, AMYLASE in the last 168 hours. No results for input(s): AMMONIA in the last 168 hours.  CBC: Recent Labs  Lab 09/09/17 0525  WBC 6.6  HGB 9.1*  HCT 28.6*  MCV 81.7  PLT 194    Cardiac Enzymes: No results for input(s): CKTOTAL, CKMB, CKMBINDEX, TROPONINI in the last 168 hours.  BNP: BNP (last 3 results) Recent Labs    07/19/17 1819 08/25/17 1140 09/09/17 0525  BNP 926.3* 1,102.9* 1,105.4*    ProBNP (last 3 results) No results for input(s): PROBNP in the last 8760 hours.   CBG: No results for input(s): GLUCAP in the last 168 hours.  Coagulation Studies: No results for input(s): LABPROT, INR in the last 72 hours.  Imaging: Dg Chest 2 View  Result Date: 09/09/2017 CLINICAL DATA:  Shortness of breath this morning. EXAM: CHEST  2 VIEW COMPARISON:  08/25/2017 FINDINGS: Postoperative changes in the mediastinum. Cardiac pacemaker. Cardiac enlargement with pulmonary vascular congestion. Mild interstitial changes in the lung bases likely due to early edema. There is progression since previous study. No pleural effusion or pneumothorax. Emphysematous changes in the lungs. Calcification of the aorta. IMPRESSION: Cardiac enlargement with pulmonary vascular congestion and developing interstitial edema since previous study. Electronically Signed   By: Lucienne Capers M.D.   On: 09/09/2017 06:21      Patient Profile  Raymond  is an 83 year old with history of CAD/CABG, htn, hyperlipidemia, memory loss, back pain, deconditioning, chronic systolic /diastoic heart failure.   He had 3 admits in 3825 with a/c systolic failure. He was last admitted 0/53 with a/c systolic heart failure. Presented today with increased dyspnea.   Assessment/Plan   1. A.C Systolic/Diastolic Heart Failure NYHA III. ECHO 07/2017 EF 20-25%.  Volume status elevated. Start lasix 80 mg twice daily Hold bb for now. No dig with CKD. Hold  Bb Watch  bp closely with diuresis  2. CKD Stage III Creatinine 1.8. Follow BMET closely  3. PAF  in NSR todayy.  Continue amiodarone 200 mg daily Continue apixaban 2.5 mg twice a day.   4. OA-  Limited mobility Uses an electric scooter.   5.Memory Loss Lives with family. ? May need SNF.   He is at high risk for readmissions due to poor insight and memory loss. Needs 24 hour care.  Place Palliative Care consult. Would benefit from Hospice. Needs GOC established.   I called his son [name] and discussed his admit. He is agreeable to Palliative Care consult.  Admit to telemetry for IV diuresis.      Darrick Grinder, NP 09/09/2017, 10:43 AM  Advanced Heart Failure Team Pager [phone] (M-F; [place])  Please contact [facility] for night-coverage after hours (4p -7a ) and weekends on [email or web address]  Patient seen and examined with Darrick Grinder, NP. We discussed all aspects of the encounter. I agree with the assessment and plan as stated above.   83 y/o with systolic HF due to iCM EF 20-25% and mild dementia presents with recurrent HF and marked volume overload. Has had 3 admits in past 6 months.  Most recently 2 weeks ago. Admits to drinking lots of fluids. Remains in NSR on amio.   Will admit for IV diuresis. Will need to consider placement as he seems unable to care for himself. In [place] so unable to get Paramedicine. Watch renal function with diuresis. Creatinine 1.8 today.   Continue apixaban for PAF.   Not candidate for advanced therapies.   Glori Bickers, MD  11:20 AM

## 2017-09-09 NOTE — ED Notes (Signed)
Patient frequently asks about his son who is not present. Patient attempted to call his son, no answer. Patient has no other complaints at this time.

## 2017-09-09 NOTE — ED Notes (Signed)
Lunch tray provided. 

## 2017-09-09 NOTE — ED Notes (Signed)
2 Gram Sodium Diet was ordered for Dinner.

## 2017-09-09 NOTE — ED Triage Notes (Signed)
Pt arrives via EMS from home by himself, pt reports shortness of breath that woke him this morning. Family reports increased anxiety. Hyperventilating upon EMS arrival. NO shob at this time. Hx dementia.

## 2017-09-09 NOTE — ED Provider Notes (Signed)
[facility] EMERGENCY DEPARTMENT Provider Note   CSN: [id number] Arrival date & time: 09/09/17  0515     History   Chief Complaint Chief Complaint  Patient presents with  . Shortness of Breath  . Anxiety    HPI [name] is a 83 y.o. male history of CHF with EF 25%, MI, DM, HTN, here with shortness of breath.  Patient states that he was recently admitted to the hospital for CHF exacerbation and is currently taking his Lasix twice daily.  Last night he noticed that he had some anxiety and some subjective shortness of breath.  He states that his legs are not more swollen than usual and his weight is stable.  He states that he has cardiology follow-up.  Denies any chest pain or shortness of breath currently.  The history is provided by the patient.    Past Medical History:  Diagnosis Date  . AICD (automatic cardioverter/defibrillator) present    DOI 2008;   . Arthritis    "mostly in my knees" (07/20/2017)  . Congestive heart failure, unspecified   . Coronary atherosclerosis of unspecified type of vessel, native or graft   . Hypothyroidism   . Ischemic cardiomyopathy   . Myocardial infarction [facility]) ?2005  . Other and unspecified hyperlipidemia   . Peptic ulcer, unspecified site, unspecified as acute or chronic, without mention of hemorrhage, perforation, or obstruction   . RA (rheumatoid arthritis) ([place])   . Type II diabetes mellitus ([place])   . Unspecified essential hypertension   . Ventricular tachycardia ([place])    Rx via ICD 5 /2012    Patient Active Problem List   Diagnosis Date Noted  . Acute on chronic diastolic congestive heart failure ([place]) 09/09/2017  . Acute on chronic systolic heart failure ([place]) 08/25/2017  . CHF (congestive heart failure) ([place]) 07/19/2017  . Hyponatremia 04/02/2017  . Normocytic anemia 04/02/2017  . OA (osteoarthritis) 11/19/2016  . Paroxysmal atrial fibrillation ([place]) 09/10/2016  . CKD (chronic kidney disease), stage III  ([place]) 09/10/2016  . Physical deconditioning 09/10/2016  . Single implantable cardioverter-defibrillator BSX    . Ventricular tachycardia-treated the ATP 01/29/2011  . Hx of CABG 04/17/2009  . Cardiomyopathy, ischemic 04/17/2009  . Acute on chronic systolic CHF (congestive heart failure) ([place]) 04/17/2009  . Insulin dependent diabetes mellitus ([place]) 04/12/2009  . Hyperlipidemia LDL goal <70 04/12/2009  . Essential hypertension 04/12/2009  . PUD 04/12/2009  . Rheumatoid arthritis ([place]) 04/12/2009    Past Surgical History:  Procedure Laterality Date  . CARDIAC CATHETERIZATION N/A 09/01/2016   Procedure: Right/Left Heart Cath and Coronary/Graft Angiography;  Surgeon: Larey Dresser, MD;  Location: [facility] CV LAB;  Service: Cardiovascular;  Laterality: N/A;  . CARPAL TUNNEL RELEASE Left 05/2008   Archie Endo 12/17/2010  . CARPAL TUNNEL RELEASE Right 03/2010   Archie Endo 04/08/2010  . CORONARY ARTERY BYPASS GRAFT  2000  . IMPLANTABLE CARDIOVERTER DEFIBRILLATOR (ICD) GENERATOR CHANGE N/A 01/25/2014   Procedure: ICD GENERATOR CHANGE;  Surgeon: Deboraha Sprang, MD;  Location: [facility] CATH LAB;  Service: Cardiovascular;  Laterality: N/A;  . INGUINAL HERNIA REPAIR Right        Home Medications    Prior to Admission medications   Medication Sig Start Date End Date Taking? Authorizing Provider  amiodarone (PACERONE) 200 MG tablet Take 1 tablet (200 mg total) by mouth daily. 07/29/17  Yes Lelon Perla, MD  apixaban (ELIQUIS) 2.5 MG TABS tablet Take 1 tablet (2.5 mg total) 2 (two) times daily  by mouth. 06/20/17 09/18/17 Yes Larey Dresser, MD  atorvastatin (LIPITOR) 10 MG tablet Take 10 mg by mouth at bedtime.    Yes [provider]  carvedilol (COREG) 3.125 MG tablet Take 3.125 mg by mouth 2 (two) times daily with a meal.   Yes [provider]  digoxin (LANOXIN) 0.125 MG tablet Take 0.5 tablets (0.0625 mg total) by mouth every other day. 06/09/17  Yes Larey Dresser, MD    esomeprazole (NEXIUM) 40 MG capsule Take 40 mg by mouth daily.     Yes [provider]  glyBURIDE (DIABETA) 5 MG tablet Take 5 mg by mouth daily with breakfast.   Yes [provider]  HUMULIN R 100 UNIT/ML injection USE VIA PUMP AS DIRECTED WITH V-GO MACHINE AS DIRECTED. MAX DD OF 66 UNITS 07/04/17  Yes [provider]  Insulin Disposable Pump (V-GO 30) KIT daily.  07/04/17  Yes [provider]  levothyroxine (SYNTHROID, LEVOTHROID) 75 MCG tablet Take 1 tablet (75 mcg total) by mouth daily before breakfast. 07/23/17  Yes Cherene Altes, MD  polyethylene glycol (MIRALAX / GLYCOLAX) packet Take 17 g by mouth daily as needed for mild constipation.    Yes [provider]  potassium chloride SA (K-DUR,KLOR-CON) 20 MEQ tablet Take 1 tablet (20 mEq total) by mouth daily. 06/09/17  Yes Larey Dresser, MD  spironolactone (ALDACTONE) 25 MG tablet Take 0.5 tablets (12.5 mg total) by mouth daily. 08/28/17  Yes Daune Perch, NP  torsemide (DEMADEX) 100 MG tablet Take 1 tablet (100 mg total) by mouth 2 (two) times daily. 05/02/17  Yes Minus Breeding, MD    Family History Family History  Problem Relation Age of Onset  . Coronary artery disease Sister   . Coronary artery disease Brother     Social History Social History   Tobacco Use  . Smoking status: Never Smoker  . Smokeless tobacco: Never Used  Substance Use Topics  . Alcohol use: No  . Drug use: No     Allergies   Metformin and related and Rivastigmine   Review of Systems Review of Systems  Respiratory: Positive for shortness of breath.   All other systems reviewed and are negative.    Physical Exam Updated Vital Signs BP 107/68 (BP Location: Right Arm)   Pulse 68   Temp 98.6 F (37 C) (Oral)   Resp 18   SpO2 98%   Physical Exam  Constitutional: He appears well-developed.  HENT:  Head: Normocephalic.  Mouth/Throat: Oropharynx is clear and moist.  Eyes: Pupils are equal,  round, and reactive to light.  Neck: Normal range of motion.  Cardiovascular: Normal rate.  Pulmonary/Chest: Effort normal.  Diminished bilateral bases, no obvious wheezing or crackles   Abdominal: Soft. Bowel sounds are normal.  Musculoskeletal: Normal range of motion.       Right lower leg: He exhibits edema.       Left lower leg: He exhibits edema.  Trace edema bilaterally   Neurological: He is alert.  Skin: Skin is warm.  Psychiatric: He has a normal mood and affect.  Nursing note and vitals reviewed.    ED Treatments / Results  Labs (all labs ordered are listed, but only abnormal results are displayed) Labs Reviewed  CBC - Abnormal; Notable for the following components:      Result Value   RBC 3.50 (*)    Hemoglobin 9.1 (*)    HCT 28.6 (*)    All other components within normal  limits  BASIC METABOLIC PANEL - Abnormal; Notable for the following components:   Sodium 128 (*)    Chloride 91 (*)    Glucose, Bld 167 (*)    BUN 38 (*)    Creatinine, Ser 1.81 (*)    Calcium 8.6 (*)    GFR calc non Af Amer 34 (*)    GFR calc Af Amer 39 (*)    All other components within normal limits  BRAIN NATRIURETIC PEPTIDE - Abnormal; Notable for the following components:   B Natriuretic Peptide 1,105.4 (*)    All other components within normal limits  URINALYSIS, ROUTINE W REFLEX MICROSCOPIC  I-STAT TROPONIN, ED    EKG  EKG Interpretation  Date/Time:  Friday September 09 2017 05:14:46 EST Ventricular Rate:  68 PR Interval:  204 QRS Duration: 142 QT Interval:  468 QTC Calculation: 497 R Axis:   169 Text Interpretation:  Sinus rhythm with Fusion complexes Right bundle branch block Anterior infarct , age undetermined Abnormal ECG No significant change since last tracing Confirmed by Wandra Arthurs ([id number]) on 09/09/2017 7:06:24 AM       Radiology Dg Chest 2 View  Result Date: 09/09/2017 CLINICAL DATA:  Shortness of breath this morning. EXAM: CHEST  2 VIEW COMPARISON:  08/25/2017  FINDINGS: Postoperative changes in the mediastinum. Cardiac pacemaker. Cardiac enlargement with pulmonary vascular congestion. Mild interstitial changes in the lung bases likely due to early edema. There is progression since previous study. No pleural effusion or pneumothorax. Emphysematous changes in the lungs. Calcification of the aorta. IMPRESSION: Cardiac enlargement with pulmonary vascular congestion and developing interstitial edema since previous study. Electronically Signed   By: Lucienne Capers M.D.   On: 09/09/2017 06:21    Procedures Procedures (including critical care time)  Medications Ordered in ED Medications  furosemide (LASIX) injection 80 mg (not administered)     Initial Impression / Assessment and Plan / ED Course  I have reviewed the triage vital signs and the nursing notes.  Pertinent labs & imaging results that were available during my care of the patient were reviewed by me and considered in my medical decision making (see chart for details).     [name] is a 83 y.o. male here with shortness of breath, leg swelling. Consider CHF exacerbation vs anxiety. Will get labs, BNP, CXR.   10:20 AM Cr 1.8, improved compared to before. BNP 1,100. CXR showed mild pulmonary edema. Given lasix 40 mg IV. Will consult cardiology to evaluate.   11:45 AM Cardiology to admit for CHF exacerbation    Final Clinical Impressions(s) / ED Diagnoses   Final diagnoses:  None    ED Discharge Orders    None       Drenda Freeze, MD 09/09/17 1145

## 2017-09-10 ENCOUNTER — Other Ambulatory Visit: Payer: Self-pay

## 2017-09-10 DIAGNOSIS — I5043 Acute on chronic combined systolic (congestive) and diastolic (congestive) heart failure: Secondary | ICD-10-CM

## 2017-09-10 LAB — GLUCOSE, CAPILLARY
GLUCOSE-CAPILLARY: 123 mg/dL — AB (ref 65–99)
GLUCOSE-CAPILLARY: 167 mg/dL — AB (ref 65–99)
Glucose-Capillary: 195 mg/dL — ABNORMAL HIGH (ref 65–99)
Glucose-Capillary: 178 mg/dL — ABNORMAL HIGH (ref 65–99)

## 2017-09-10 LAB — BASIC METABOLIC PANEL
Anion gap: 13 (ref 5–15)
BUN: 33 mg/dL — AB (ref 6–20)
CHLORIDE: 92 mmol/L — AB (ref 101–111)
CO2: 25 mmol/L (ref 22–32)
Calcium: 8.6 mg/dL — ABNORMAL LOW (ref 8.9–10.3)
Creatinine, Ser: 1.67 mg/dL — ABNORMAL HIGH (ref 0.61–1.24)
GFR, EST AFRICAN AMERICAN: 43 mL/min — AB (ref >60)
GFR, EST NON AFRICAN AMERICAN: 37 mL/min — AB (ref >60)
Glucose, Bld: 125 mg/dL — ABNORMAL HIGH (ref 65–99)
Potassium: 3.5 mmol/L (ref 3.5–5.1)
SODIUM: 130 mmol/L — AB (ref 135–145)

## 2017-09-10 MED ORDER — SPIRONOLACTONE 12.5 MG HALF TABLET
12.5000 mg | ORAL_TABLET | Freq: Every day | ORAL | Status: DC
  Administered 2017-09-10 – 2017-09-13 (×4): 12.5 mg via ORAL
  Filled 2017-09-10 (×4): qty 1

## 2017-09-10 NOTE — NC FL2 (Signed)
[place] MEDICAID FL2 LEVEL OF CARE SCREENING TOOL     IDENTIFICATION  Patient Name: [name] Birthdate: September 28, 1934 Sex: male Admission Date (Current Location): 09/09/2017  [facility] and [place] Number:  [employer] and Address:  [facility] [place], [address] [place], [place] [address]      Provider Number: [id number]  Attending Physician Name and Address:  Laurey Morale, MD  Relative Name and Phone Number:       Current Level of Care: Hospital Recommended Level of Care: Skilled Nursing Facility Prior Approval Number:    Date Approved/Denied:   PASRR Number: [id number]  Discharge Plan: SNF    Current Diagnoses: Patient Active Problem List   Diagnosis Date Noted  . Acute on chronic diastolic congestive heart failure (HCC) 09/09/2017  . Acute on chronic systolic heart failure (HCC) 08/25/2017  . CHF (congestive heart failure) (HCC) 07/19/2017  . Hyponatremia 04/02/2017  . Normocytic anemia 04/02/2017  . OA (osteoarthritis) 11/19/2016  . Paroxysmal atrial fibrillation (HCC) 09/10/2016  . CKD (chronic kidney disease), stage III (HCC) 09/10/2016  . Physical deconditioning 09/10/2016  . Single implantable cardioverter-defibrillator BSX    . Ventricular tachycardia-treated the ATP 01/29/2011  . Hx of CABG 04/17/2009  . Cardiomyopathy, ischemic 04/17/2009  . Acute on chronic systolic CHF (congestive heart failure) (HCC) 04/17/2009  . Insulin dependent diabetes mellitus (HCC) 04/12/2009  . Hyperlipidemia LDL goal <70 04/12/2009  . Essential hypertension 04/12/2009  . PUD 04/12/2009  . Rheumatoid arthritis (HCC) 04/12/2009    Orientation RESPIRATION BLADDER Height & Weight     Self, Time, Situation, Place  Normal Continent, External catheter Weight: 205 lb 14.6 oz (93.4 kg) Height:  5\' 10"  (177.8 cm)  BEHAVIORAL SYMPTOMS/MOOD NEUROLOGICAL BOWEL NUTRITION STATUS      Continent Diet(see discharge summary)  AMBULATORY STATUS  COMMUNICATION OF NEEDS Skin   Total Care(uses wheelchair) Verbally Normal                       Personal Care Assistance Level of Assistance  Bathing, Feeding, Dressing Bathing Assistance: Maximum assistance Feeding assistance: Independent Dressing Assistance: Maximum assistance     Functional Limitations Info  Sight, Hearing, Speech Sight Info: Adequate(uses glasses) Hearing Info: Adequate Speech Info: Adequate    SPECIAL CARE FACTORS FREQUENCY  PT (By licensed PT), OT (By licensed OT)     PT Frequency: 2x week OT Frequency: 2x week            Contractures Contractures Info: Not present    Additional Factors Info  Code Status, Allergies Code Status Info: Prior Allergies Info: METFORMIN AND RELATED, RIVASTIGMINE            Current Medications (09/10/2017):  This is the current hospital active medication list Current Facility-Administered Medications  Medication Dose Route Frequency Provider Last Rate Last Dose  . acetaminophen (TYLENOL) tablet 650 mg  650 mg Oral Q6H PRN 09/12/2017, NP   650 mg at 09/09/17 1836  . amiodarone (PACERONE) tablet 200 mg  200 mg Oral Daily Clegg, Amy D, NP   200 mg at 09/10/17 0835  . apixaban (ELIQUIS) tablet 2.5 mg  2.5 mg Oral BID Clegg, Amy D, NP   2.5 mg at 09/10/17 0837  . atorvastatin (LIPITOR) tablet 10 mg  10 mg Oral QHS Clegg, Amy D, NP   10 mg at 09/09/17 2139  . benzonatate (TESSALON) capsule 100 mg  100 mg Oral TID PRN 2140, Amy D,  NP      . digoxin (LANOXIN) tablet 0.0625 mg  0.0625 mg Oral QODAY Clegg, Amy D, NP   0.0625 mg at 09/09/17 1234  . furosemide (LASIX) injection 80 mg  80 mg Intravenous BID Charlynne Pander, MD   80 mg at 09/10/17 8299  . glyBURIDE (DIABETA) tablet 5 mg  5 mg Oral Q breakfast Clegg, Amy D, NP   5 mg at 09/10/17 0836  . insulin aspart (novoLOG) injection 0-20 Units  0-20 Units Subcutaneous TID WC Clegg, Amy D, NP   4 Units at 09/10/17 1259  . levothyroxine (SYNTHROID, LEVOTHROID)  tablet 75 mcg  75 mcg Oral QAC breakfast Clegg, Amy D, NP   75 mcg at 09/10/17 0603  . polyethylene glycol (MIRALAX / GLYCOLAX) packet 17 g  17 g Oral Daily PRN Clegg, Amy D, NP      . spironolactone (ALDACTONE) tablet 12.5 mg  12.5 mg Oral Daily Laurey Morale, MD   12.5 mg at 09/10/17 1102     Discharge Medications: Please see discharge summary for a list of discharge medications.  Relevant Imaging Results:  Relevant Lab Results:   Additional Information SS# [id number] [place], [place]

## 2017-09-10 NOTE — Evaluation (Signed)
Physical Therapy Evaluation Patient Details Name: [name] MRN: [id number] DOB: [birth year] Today's Date: 09/10/2017   History of Present Illness  83 yo male with onset of acute on chronic CHF, cardiomegaly, EF 20-25%, and pulm vascular congestion.  PMHx:  CAD, CABG, HTN, HLD, repetitive admits for CHF, ischemic cardiomyopathy, CKD, OA, a-fib,   Clinical Impression  Pt was seen for evaluation of mobility with greatest issue to stand up from any height.  Pt is verbalizing wish to go home today and MD is clear that he is not ready and expresses concern about repetitive admits.  He is a candidate for SNF as he cannot move well and is a high fall risk.  Will focus on acute needs for strength and control of standing to increase independence and shorten need to stay in SNF.    Follow Up Recommendations SNF    Equipment Recommendations  None recommended by PT    Recommendations for Other Services       Precautions / Restrictions Precautions Precautions: Fall(telemetry) Restrictions Weight Bearing Restrictions: No      Mobility  Bed Mobility Overal bed mobility: Needs Assistance Bed Mobility: Supine to Sit     Supine to sit: Mod assist     General bed mobility comments: pt is needing help to pull up trunk out of the bed and assist to finish scooting to EOB  Transfers Overall transfer level: Needs assistance Equipment used: Rolling walker (2 wheeled);1 person hand held assist Transfers: Sit to/from [employer] Sit to Stand: Mod assist;From elevated surface Stand pivot transfers: Mod assist;+2 physical assistance;+2 safety/equipment;From elevated surface       General transfer comment: dense cues for all with sequencing help  Ambulation/Gait             General Gait Details: unable to walk safely  Stairs            Wheelchair Mobility    Modified Rankin (Stroke Patients Only)       Balance Overall balance assessment: Needs  assistance Sitting-balance support: Feet supported Sitting balance-Leahy Scale: Fair       Standing balance-Leahy Scale: Poor                               Pertinent Vitals/Pain Pain Assessment: No/denies pain    Home Living Family/patient expects to be discharged to:: Private residence Living Arrangements: Other relatives Available Help at Discharge: Family;Available PRN/intermittently(granddaughter and her sons who pt reports are very available) Type of Home: House Home Access: Ramped entrance     Home Layout: One level Home Equipment: Insurance underwriter - 2 wheels Additional Comments: states he could get OOB and transfer alone previously    Prior Function Level of Independence: Independent with assistive device(s)         Comments: uses scooter for most mobility in and outside.  Transfers only     Hand Dominance   Dominant Hand: Right    Extremity/Trunk Assessment   Upper Extremity Assessment Upper Extremity Assessment: Generalized weakness    Lower Extremity Assessment Lower Extremity Assessment: Generalized weakness    Cervical / Trunk Assessment Cervical / Trunk Assessment: Kyphotic  Communication   Communication: No difficulties  Cognition Arousal/Alertness: Awake/alert Behavior During Therapy: WFL for tasks assessed/performed Overall Cognitive Status: No family/caregiver present to determine baseline cognitive functioning  General Comments: pt reports data that conflicts with previous admission information      General Comments General comments (skin integrity, edema, etc.): doctor stepped in during therapy to ask questions    Exercises     Assessment/Plan    PT Assessment Patient needs continued PT services  PT Problem List Decreased strength;Decreased range of motion;Decreased activity tolerance;Decreased balance;Decreased mobility;Decreased coordination;Decreased  cognition;Decreased knowledge of use of DME;Decreased safety awareness;Decreased knowledge of precautions;Cardiopulmonary status limiting activity;Obesity;Decreased skin integrity       PT Treatment Interventions DME instruction;Gait training;Functional mobility training;Therapeutic activities;Therapeutic exercise;Balance training;Neuromuscular re-education;Patient/family education    PT Goals (Current goals can be found in the Care Plan section)  Acute Rehab PT Goals Patient Stated Goal: wants to go home today PT Goal Formulation: With patient Time For Goal Achievement: 09/24/17 Potential to Achieve Goals: Fair    Frequency Min 2X/week   Barriers to discharge Inaccessible home environment;Decreased caregiver support home with no help at times and has inability to transfer alone    Co-evaluation               AM-PAC PT "6 Clicks" Daily Activity  Outcome Measure Difficulty turning over in bed (including adjusting bedclothes, sheets and blankets)?: Unable Difficulty moving from lying on back to sitting on the side of the bed? : Unable Difficulty sitting down on and standing up from a chair with arms (e.g., wheelchair, bedside commode, etc,.)?: Unable Help needed moving to and from a bed to chair (including a wheelchair)?: A Lot Help needed walking in hospital room?: Total Help needed climbing 3-5 steps with a railing? : Total 6 Click Score: 7    End of Session Equipment Utilized During Treatment: Gait belt Activity Tolerance: Patient limited by fatigue Patient left: in chair;with call bell/phone within reach;with chair alarm set;with nursing/sitter in room Nurse Communication: Mobility status PT Visit Diagnosis: Unsteadiness on feet (R26.81);Muscle weakness (generalized) (M62.81);Difficulty in walking, not elsewhere classified (R26.2);Adult, failure to thrive (R62.7)    Time: 0935-1007 PT Time Calculation (min) (ACUTE ONLY): 32 min   Charges:   PT Evaluation $PT Eval  Moderate Complexity: 1 Mod PT Treatments $Therapeutic Activity: 8-22 mins   PT G Codes:   PT G-Codes **NOT FOR INPATIENT CLASS** Functional Assessment Tool Used: AM-PAC 6 Clicks Basic Mobility    Ivar Drape 09/10/2017, 11:45 AM   Samul Dada, PT MS Acute Rehab Dept. Number: [facility] [id number] and [facility] [phone]

## 2017-09-10 NOTE — Progress Notes (Signed)
Patient ID: [name], male   DOB: November 04, 1934, 83 y.o.   MRN: [id number]     Advanced Heart Failure Rounding Note   HF Cardiology: [name]  Subjective:    Good diuresis overnight, weight down 2 lbs.  Creatinine down to 1.67.  Very weak working with PT, will need SNF stay.    Objective:   Weight Range: 205 lb 14.6 oz (93.4 kg) Body mass index is 29.54 kg/m.   Vital Signs:   Temp:  [97.6 F (36.4 C)-98 F (36.7 C)] 98 F (36.7 C) (01/26 0553) Pulse Rate:  [62-71] 69 (01/26 0553) Resp:  [12-27] 20 (01/26 0553) BP: (90-112)/(46-71) 105/53 (01/26 0553) SpO2:  [92 %-100 %] 100 % (01/26 0553) Weight:  [205 lb 14.6 oz (93.4 kg)-207 lb 10.8 oz (94.2 kg)] 205 lb 14.6 oz (93.4 kg) (01/26 0553) Last BM Date: 09/06/17  Weight change: Filed Weights   09/09/17 1810 09/10/17 0553  Weight: 207 lb 10.8 oz (94.2 kg) 205 lb 14.6 oz (93.4 kg)    Intake/Output:   Intake/Output Summary (Last 24 hours) at 09/10/2017 1008 Last data filed at 09/10/2017 0602 Gross per 24 hour  Intake 120 ml  Output 2250 ml  Net -2130 ml      Physical Exam    General:  Well appearing. No resp difficulty HEENT: Normal Neck: Supple. JVP 14+ cm. No lymphadenopathy or thyromegaly appreciated. Cor: PMI lateral. Regular rate & rhythm. No rubs, gallops or murmurs. Lungs: Clear bilaterally Abdomen: Soft, nontender, nondistended. No hepatosplenomegaly. No bruits or masses. Good bowel sounds. Extremities: No cyanosis, clubbing, rash.  1+ edema 1/2 to knees bilaterally Neuro: Alert & orientedx3, cranial nerves grossly intact. moves all 4 extremities w/o difficulty. Affect pleasant   Telemetry   NSR (personally reviewed)  Labs    CBC Recent Labs    09/09/17 0525  WBC 6.6  HGB 9.1*  HCT 28.6*  MCV 81.7  PLT 194   Basic Metabolic Panel Recent Labs    84/69/62 0525 09/10/17 0714  NA 128* 130*  K 4.3 3.5  CL 91* 92*  CO2 24 25  GLUCOSE 167* 125*  BUN 38* 33*  CREATININE 1.81* 1.67*    CALCIUM 8.6* 8.6*   Liver Function Tests No results for input(s): AST, ALT, ALKPHOS, BILITOT, PROT, ALBUMIN in the last 72 hours. No results for input(s): LIPASE, AMYLASE in the last 72 hours. Cardiac Enzymes No results for input(s): CKTOTAL, CKMB, CKMBINDEX, TROPONINI in the last 72 hours.  BNP: BNP (last 3 results) Recent Labs    07/19/17 1819 08/25/17 1140 09/09/17 0525  BNP 926.3* 1,102.9* 1,105.4*    ProBNP (last 3 results) No results for input(s): PROBNP in the last 8760 hours.   D-Dimer No results for input(s): DDIMER in the last 72 hours. Hemoglobin A1C No results for input(s): HGBA1C in the last 72 hours. Fasting Lipid Panel No results for input(s): CHOL, HDL, LDLCALC, TRIG, CHOLHDL, LDLDIRECT in the last 72 hours. Thyroid Function Tests No results for input(s): TSH, T4TOTAL, T3FREE, THYROIDAB in the last 72 hours.  Invalid input(s): FREET3  Other results:   Imaging     No results found.   Medications:     Scheduled Medications: . amiodarone  200 mg Oral Daily  . apixaban  2.5 mg Oral BID  . atorvastatin  10 mg Oral QHS  . digoxin  0.0625 mg Oral QODAY  . furosemide  80 mg Intravenous BID  . glyBURIDE  5 mg Oral Q breakfast  .  insulin aspart  0-20 Units Subcutaneous TID WC  . levothyroxine  75 mcg Oral QAC breakfast  . spironolactone  12.5 mg Oral Daily     Infusions:   PRN Medications:  acetaminophen, benzonatate, polyethylene glycol    Patient Profile   Ziere Docken Jonesis a 82 y.o.malewith CAD s/p CABG, hypertension, hyperlipidemia and chronic systolic and diastolic heart failure. Readmitted 1/25 with increased dyspnea and weight gain, reports taking all meds and trying to limit sodium intake.  Assessment/Plan   1.Acute on chronic systolic CHF: Ischemic cardiomyopathy. Echo 08/26/16 with LVEF 10-15%, severe biatrial enlargement, decreased RV systolic function. Most recent echo in 12/18 with EF20-25%. [employer] ICD.  He is minimally active at baseline and is primarily wheelchair-bound due to severe arthritis. NYHA classIIIbsymptoms at admission.Recently admitted with acute/chronic systolic CHF, now back with the same.  He is volume overloaded on exam.  Says that he has been taking his medications as ordered.   -Lasix 80 mg IV bid today.  - Hold Coreg with soft BP, continue spironolactone 12.5 daily.   - Continue digoxin 0.0625 every other day,level in am.    -He has IVCD ( ). CRT will be considerationdown the roadthough benefit may not be as much as with true LBBB. -Will need to work on approval for [employer]. - Will try to get fully diuresed this admission, think he may have left a little early last admission.  2. CKD stage LXB:WIOMBTDHRC lower today with diuresis.  3. CAD: s/p CABG. Coronary angiography 1/18 with patent graft and no targets for revascularization. No recent chest pain.  - Continue statin, no ASA given stable CAD and apixaban use.  4. Atrial fibrillation: NSR today. No tachypalpitations.  - Continue amiodarone 200 mg daily.  - Continue Eliquis 2.5 mg BID (age, elevated creatinine).  5. Osteoarthritis: Knee OA significantly limits activity. 6. Deconditioning: Work with PT, will need rehab stay.    Agree with palliative care consult.   Length of Stay: 1  Marca Ancona, MD  09/10/2017, 10:08 AM  Advanced Heart Failure Team Pager [phone] (M-F; 7a - 4p)  Please contact [facility] for night-coverage after hours (4p -7a ) and weekends on [email or web address]

## 2017-09-11 DIAGNOSIS — Z515 Encounter for palliative care: Secondary | ICD-10-CM

## 2017-09-11 DIAGNOSIS — Z7189 Other specified counseling: Secondary | ICD-10-CM

## 2017-09-11 DIAGNOSIS — I5033 Acute on chronic diastolic (congestive) heart failure: Secondary | ICD-10-CM

## 2017-09-11 LAB — GLUCOSE, CAPILLARY
GLUCOSE-CAPILLARY: 128 mg/dL — AB (ref 65–99)
Glucose-Capillary: 134 mg/dL — ABNORMAL HIGH (ref 65–99)
Glucose-Capillary: 116 mg/dL — ABNORMAL HIGH (ref 65–99)
Glucose-Capillary: 263 mg/dL — ABNORMAL HIGH (ref 65–99)

## 2017-09-11 LAB — BASIC METABOLIC PANEL
Anion gap: 11 (ref 5–15)
BUN: 31 mg/dL — AB (ref 6–20)
CHLORIDE: 92 mmol/L — AB (ref 101–111)
CO2: 27 mmol/L (ref 22–32)
CREATININE: 1.81 mg/dL — AB (ref 0.61–1.24)
Calcium: 8.6 mg/dL — ABNORMAL LOW (ref 8.9–10.3)
GFR calc Af Amer: 39 mL/min — ABNORMAL LOW (ref >60)
GFR calc non Af Amer: 34 mL/min — ABNORMAL LOW (ref >60)
GLUCOSE: 137 mg/dL — AB (ref 65–99)
Potassium: 3.6 mmol/L (ref 3.5–5.1)
SODIUM: 130 mmol/L — AB (ref 135–145)

## 2017-09-11 LAB — DIGOXIN LEVEL: Digoxin Level: 0.6 ng/mL — ABNORMAL LOW (ref 0.8–2.0)

## 2017-09-11 MED ORDER — INSULIN ASPART 100 UNIT/ML ~~LOC~~ SOLN
2.0000 [IU] | Freq: Once | SUBCUTANEOUS | Status: AC
  Administered 2017-09-11: 2 [IU] via SUBCUTANEOUS

## 2017-09-11 MED ORDER — POTASSIUM CHLORIDE CRYS ER 20 MEQ PO TBCR
40.0000 meq | EXTENDED_RELEASE_TABLET | Freq: Once | ORAL | Status: AC
  Administered 2017-09-11: 40 meq via ORAL
  Filled 2017-09-11: qty 2

## 2017-09-11 MED ORDER — METOLAZONE 2.5 MG PO TABS
2.5000 mg | ORAL_TABLET | Freq: Once | ORAL | Status: AC
  Administered 2017-09-11: 2.5 mg via ORAL
  Filled 2017-09-11: qty 1

## 2017-09-11 MED ORDER — INSULIN ASPART 100 UNIT/ML ~~LOC~~ SOLN
0.0000 [IU] | Freq: Three times a day (TID) | SUBCUTANEOUS | Status: DC
  Administered 2017-09-12: 3 [IU] via SUBCUTANEOUS
  Administered 2017-09-12: 1 [IU] via SUBCUTANEOUS
  Administered 2017-09-12: 3 [IU] via SUBCUTANEOUS
  Administered 2017-09-13: 1 [IU] via SUBCUTANEOUS
  Administered 2017-09-13: 2 [IU] via SUBCUTANEOUS

## 2017-09-11 NOTE — Progress Notes (Signed)
Pharmacist Heart Failure Core Measure Documentation  Assessment: [name] has an EF documented as 20-25% on 07/20/17 by Echo.  Rationale: Heart failure patients with left ventricular systolic dysfunction (LVSD) and an EF < 40% should be prescribed an angiotensin converting enzyme inhibitor (ACEI) or angiotensin receptor blocker (ARB) at discharge unless a contraindication is documented in the medical record.  This patient is not currently on an ACEI or ARB for HF.  This note is being placed in the record in order to provide documentation that a contraindication to the use of these agents is present for this encounter.  ACE Inhibitor or Angiotensin Receptor Blocker is contraindicated (specify all that apply)  []   ACEI allergy AND ARB allergy []   Angioedema []   Moderate or severe aortic stenosis []   Hyperkalemia [x]   Hypotension []   Renal artery stenosis []   Worsening renal function, preexisting renal disease or dysfunction  , Pharm D 09/11/2017 12:48 PM

## 2017-09-11 NOTE — Progress Notes (Signed)
Patient tried to get up twice throughout the night, keeps saying he needs his clothes so that he can go home. RN explained to patient that MD will come to see him and update him on his discharge date.

## 2017-09-11 NOTE — Progress Notes (Signed)
Patient ID: [name], male   DOB: 1934-12-20, 83 y.o.   MRN: [id number]   I met with patient, son, and daughter-in-law to discuss goals of care. All readily admit to progressive weakness and decline over the past few months. They would like him to discharge to [facility] in [place] and then hope tro transition him back home. They plan to hire sitters and we talked about involving hospice at home and they were receptive to this idea. We talked at length about code status including probable futility in the setting of advanced CM. Patient says he would be inclined to not pursue aggressive measures at end of life but says he wants to defer to his son. His son says he is not ready to make that decision and requests an opportunity to speak with family in private. I will ask that our team follow up prior to discharge.   Note that patient's wife died two years ago under hospice care and was a DNR. Patient cites that death as the way he "wants to go."  Recommendations: 1. STR with consideration for hospice at home.  2. Will need follow up regarding code status

## 2017-09-11 NOTE — Consult Note (Signed)
Consultation Note Date: 09/11/2017   Patient Name: [name]  DOB: October 05, 1934  MRN: [id number]  Age / Sex: 83 y.o., male  PCP: Myer Peer, MD Referring Physician: Larey Dresser, MD  Reason for Consultation: Establishing goals of care  HPI/Patient Profile: 83 y.o. male  with past medical history of ICM with EF 09-32% and diastolic dysfunction per ECHO s/p AICD, CAD s/p CABG, CKD III, memory impairment, HTN, HLD, admitted on 09/09/2017 with acute on chronic CHF. This is patient's third hospitalization in past two months for same.    Clinical Assessment and Goals of Care: I met with patient who tells me that he is symptomatically improved since admission. He says that at baseline, he lives at home with his granddaughter and Designer, industrial/product. He is functionally chairbound and uses a motorized scooter in/out of the house. He is limited by both weakness and exertional dyspnea. However, despite these limitations, patient says he mostly cares for himself and is able to meet his own ADLs. He says he also manages his own medications using a pill box. He is able to transfer himself to and from the scooter. Patient has been followed at home by a home health nurse and PT, but patient describes a recent reduction in the frequency of their visits. Plan is likely for short-term rehab, which patient has gone to before. He then hopes to return home.   Patient tells me that he recognizes that his health is chronically limited and that he is unlikely to significantly improve in regards to cardiac or functional ability. He hopes to stay at home and has multiple family members (including his son) that live in close proximity.   Patient does not have a living will or HCPOA. However, he says that he is interested in having these conversations and establishing both while he is in the hospital. I will ask our chaplains to assist  with this. Patient would want his son to be is primary Media planner. He tells me that he has not yet talked to his son about what decisions he would want made but would like to do that today. I have asked the RN to call me once his son arrives.    SUMMARY OF RECOMMENDATIONS   1. Chaplain to assist with establishing advanced directives 2. Family meeting once son arrives  Code Status/Advance Care Planning:  Full code   Discharge Planning: To Be Determined      Primary Diagnoses: Present on Admission: . Acute on chronic diastolic congestive heart failure ([place])   I have reviewed the medical record, interviewed the patient and family, and examined the patient. The following aspects are pertinent.  Past Medical History:  Diagnosis Date  . AICD (automatic cardioverter/defibrillator) present    DOI 2008;   . Arthritis    "mostly in my knees" (07/20/2017)  . Congestive heart failure, unspecified   . Coronary atherosclerosis of unspecified type of vessel, native or graft   . Hypothyroidism   . Ischemic cardiomyopathy   . Myocardial infarction [facility]) ?  2005  . Other and unspecified hyperlipidemia   . Peptic ulcer, unspecified site, unspecified as acute or chronic, without mention of hemorrhage, perforation, or obstruction   . RA (rheumatoid arthritis) ([place])   . Type II diabetes mellitus ([place])   . Unspecified essential hypertension   . Ventricular tachycardia ([place])    Rx via ICD 5 /2012   Social History   Socioeconomic History  . Marital status: Widowed    Spouse name: None  . Number of children: None  . Years of education: None  . Highest education level: None  Social Needs  . Financial resource strain: None  . Food insecurity - worry: None  . Food insecurity - inability: None  . Transportation needs - medical: None  . Transportation needs - non-medical: None  Occupational History  . None  Tobacco Use  . Smoking status: Never Smoker  . Smokeless tobacco: Never Used    Substance and Sexual Activity  . Alcohol use: No  . Drug use: No  . Sexual activity: None  Other Topics Concern  . None  Social History Narrative   Married. Has 1 surviving child and 4 surviving grandchildren.    Family History  Problem Relation Age of Onset  . Coronary artery disease Sister   . Coronary artery disease Brother    Scheduled Meds: . amiodarone  200 mg Oral Daily  . apixaban  2.5 mg Oral BID  . atorvastatin  10 mg Oral QHS  . digoxin  0.0625 mg Oral QODAY  . furosemide  80 mg Intravenous BID  . glyBURIDE  5 mg Oral Q breakfast  . insulin aspart  0-20 Units Subcutaneous TID WC  . levothyroxine  75 mcg Oral QAC breakfast  . spironolactone  12.5 mg Oral Daily   Continuous Infusions: PRN Meds:.acetaminophen, benzonatate, polyethylene glycol Medications Prior to Admission:  Prior to Admission medications   Medication Sig Start Date End Date Taking? Authorizing Provider  amiodarone (PACERONE) 200 MG tablet Take 1 tablet (200 mg total) by mouth daily. 07/29/17  Yes Lelon Perla, MD  apixaban (ELIQUIS) 2.5 MG TABS tablet Take 1 tablet (2.5 mg total) 2 (two) times daily by mouth. 06/20/17 09/18/17 Yes Larey Dresser, MD  atorvastatin (LIPITOR) 10 MG tablet Take 10 mg by mouth at bedtime.    Yes [provider]  carvedilol (COREG) 3.125 MG tablet Take 3.125 mg by mouth 2 (two) times daily with a meal.   Yes [provider]  digoxin (LANOXIN) 0.125 MG tablet Take 0.5 tablets (0.0625 mg total) by mouth every other day. 06/09/17  Yes Larey Dresser, MD  esomeprazole (NEXIUM) 40 MG capsule Take 40 mg by mouth daily.     Yes [provider]  glyBURIDE (DIABETA) 5 MG tablet Take 5 mg by mouth daily with breakfast.   Yes [provider]  HUMULIN R 100 UNIT/ML injection USE VIA PUMP AS DIRECTED WITH V-GO MACHINE AS DIRECTED. MAX DD OF 66 UNITS 07/04/17  Yes [provider]  hydrocortisone 2.5 % cream Apply 1 application topically  daily. Apply to external ear canals, only as needed for itching   Yes [provider]  Insulin Disposable Pump (V-GO 30) KIT daily.  07/04/17  Yes [provider]  levothyroxine (SYNTHROID, LEVOTHROID) 75 MCG tablet Take 1 tablet (75 mcg total) by mouth daily before breakfast. 07/23/17  Yes Cherene Altes, MD  polyethylene glycol (MIRALAX / GLYCOLAX) packet Take 17 g by mouth daily as needed for mild  constipation.    Yes [provider]  potassium chloride SA (K-DUR,KLOR-CON) 20 MEQ tablet Take 1 tablet (20 mEq total) by mouth daily. 06/09/17  Yes Larey Dresser, MD  spironolactone (ALDACTONE) 25 MG tablet Take 0.5 tablets (12.5 mg total) by mouth daily. 08/28/17  Yes Daune Perch, NP  torsemide (DEMADEX) 100 MG tablet Take 1 tablet (100 mg total) by mouth 2 (two) times daily. 05/02/17  Yes Minus Breeding, MD   Allergies  Allergen Reactions  . Metformin And Related Other (See Comments)    "Made sugar go wild"   . Rivastigmine Nausea And Vomiting    NO EXELON PATCHES!!   Review of Systems  Respiratory: Negative for cough and shortness of breath.   Cardiovascular: Negative for chest pain.  Gastrointestinal: Negative for abdominal pain, nausea and vomiting.    Physical Exam  Constitutional: He is oriented to person, place, and time. He does not appear ill.  Neck: Normal range of motion.  Cardiovascular: Normal rate and regular rhythm.  Pulmonary/Chest: Effort normal and breath sounds normal.  Abdominal: Soft. Bowel sounds are normal.  Musculoskeletal: Normal range of motion.  Neurological: He is alert and oriented to person, place, and time.  Skin: Skin is warm and dry.  Psychiatric: He has a normal mood and affect.    Vital Signs: BP 101/70 (BP Location: Right Arm)   Pulse 76   Temp 98.1 F (36.7 C) (Oral)   Resp 18   Ht '5\' 10"'$  (1.778 m)   Wt 93.1 kg (205 lb 4 oz)   SpO2 98%   BMI 29.45 kg/m  Pain Assessment: No/denies pain   Pain Score:  0-No pain   SpO2: SpO2: 98 % O2 Device:SpO2: 98 % O2 Flow Rate: .   IO: Intake/output summary:   Intake/Output Summary (Last 24 hours) at 09/11/2017 9562 Last data filed at 09/11/2017 0505 Gross per 24 hour  Intake 920 ml  Output 1950 ml  Net -1030 ml    LBM: Last BM Date: 09/06/17 Baseline Weight: Weight: 94.2 kg (207 lb 10.8 oz) Most recent weight: Weight: 93.1 kg (205 lb 4 oz)     Palliative Assessment/Data:   Flowsheet Rows     Most Recent Value  Intake Tab  Referral Department  Cardiology  Unit at Time of Referral  Med/Surg Unit  Palliative Care Primary Diagnosis  Cardiac  Date Notified  09/09/17  Palliative Care Type  New Palliative care  Reason for referral  Clarify Goals of Care, Advance Care Planning  Date of Admission  09/09/17  Date first seen by Palliative Care  09/11/17  # of days Palliative referral response time  2 Day(s)  # of days IP prior to Palliative referral  0  Clinical Assessment  Palliative Performance Scale Score  30%  Pain Max last 24 hours  Not able to report  Pain Min Last 24 hours  Not able to report  Dyspnea Max Last 24 Hours  Not able to report  Dyspnea Min Last 24 hours  Not able to report  Nausea Max Last 24 Hours  Not able to report  Nausea Min Last 24 Hours  Not able to report  Anxiety Max Last 24 Hours  Not able to report  Anxiety Min Last 24 Hours  Not able to report  Other Max Last 24 Hours  Not able to report  Psychosocial & Spiritual Assessment  Palliative Care Outcomes  Patient/Family meeting held?  Yes  Who was at the meeting?  patient  Palliative Care Outcomes  Clarified goals of care  Palliative Care follow-up planned  Yes, Home  Palliative Care Follow-up Reason  Clarify goals of care      Time In: 0815 Time Out: 0900 Time Total: 45 minutes Greater than 50%  of this time was spent counseling and coordinating care related to the above assessment and plan.  Signed by: Irean Hong, NP   Please contact  Palliative Medicine Team phone at [phone] for questions and concerns.  For individual provider: See Shea Evans

## 2017-09-11 NOTE — Plan of Care (Signed)
  Education: Knowledge of General Education information will improve 09/11/2017 0033 - Progressing by Jeanella Flattery, RN   Clinical Measurements: Ability to maintain clinical measurements within normal limits will improve 09/11/2017 0033 - Progressing by Jeanella Flattery, RN

## 2017-09-11 NOTE — Progress Notes (Signed)
Patient ID: [name], male   DOB: [birth year], 83 y.o.   MRN: [id number]     Advanced Heart Failure Rounding Note   HF Cardiology: [name]  Subjective:    Creatinine 1.8 today.  Some diuresis over the last day but weight not down.  Weak, PT says he will need SNF stay.  Seen by palliative care.     Objective:   Weight Range: 205 lb 4 oz (93.1 kg) Body mass index is 29.45 kg/m.   Vital Signs:   Temp:  [97.5 F (36.4 C)-98.1 F (36.7 C)] 98.1 F (36.7 C) (01/27 0440) Pulse Rate:  [71-77] 76 (01/27 0440) Resp:  [18-20] 18 (01/27 0440) BP: (90-101)/(49-70) 101/70 (01/27 0440) SpO2:  [97 %-100 %] 98 % (01/27 0440) Weight:  [205 lb 4 oz (93.1 kg)] 205 lb 4 oz (93.1 kg) (01/27 0440) Last BM Date: 09/06/17  Weight change: Filed Weights   09/09/17 1810 09/10/17 0553 09/11/17 0440  Weight: 207 lb 10.8 oz (94.2 kg) 205 lb 14.6 oz (93.4 kg) 205 lb 4 oz (93.1 kg)    Intake/Output:   Intake/Output Summary (Last 24 hours) at 09/11/2017 0948 Last data filed at 09/11/2017 0505 Gross per 24 hour  Intake 680 ml  Output 1750 ml  Net -1070 ml      Physical Exam    General: NAD Neck: JVP 14 cm, no thyromegaly or thyroid nodule.  Lungs: Mild crackles at bases bilaterally. CV: Nondisplaced PMI.  Heart regular S1/S2, no S3/S4, no murmur.  1+ edema to knees bilaterally. Abdomen: Soft, nontender, no hepatosplenomegaly, no distention.  Skin: Intact without lesions or rashes.  Neurologic: Alert and oriented x 3.  Psych: Normal affect. Extremities: No clubbing or cyanosis.  HEENT: Normal.    Telemetry   NSR (personally reviewed)  Labs    CBC Recent Labs    09/09/17 0525  WBC 6.6  HGB 9.1*  HCT 28.6*  MCV 81.7  PLT 194   Basic Metabolic Panel Recent Labs    35/70/17 0714 09/11/17 0707  NA 130* 130*  K 3.5 3.6  CL 92* 92*  CO2 25 27  GLUCOSE 125* 137*  BUN 33* 31*  CREATININE 1.67* 1.81*  CALCIUM 8.6* 8.6*   Liver Function Tests No results for input(s):  AST, ALT, ALKPHOS, BILITOT, PROT, ALBUMIN in the last 72 hours. No results for input(s): LIPASE, AMYLASE in the last 72 hours. Cardiac Enzymes No results for input(s): CKTOTAL, CKMB, CKMBINDEX, TROPONINI in the last 72 hours.  BNP: BNP (last 3 results) Recent Labs    07/19/17 1819 08/25/17 1140 09/09/17 0525  BNP 926.3* 1,102.9* 1,105.4*    ProBNP (last 3 results) No results for input(s): PROBNP in the last 8760 hours.   D-Dimer No results for input(s): DDIMER in the last 72 hours. Hemoglobin A1C No results for input(s): HGBA1C in the last 72 hours. Fasting Lipid Panel No results for input(s): CHOL, HDL, LDLCALC, TRIG, CHOLHDL, LDLDIRECT in the last 72 hours. Thyroid Function Tests No results for input(s): TSH, T4TOTAL, T3FREE, THYROIDAB in the last 72 hours.  Invalid input(s): FREET3  Other results:   Imaging    No results found.   Medications:     Scheduled Medications: . amiodarone  200 mg Oral Daily  . apixaban  2.5 mg Oral BID  . atorvastatin  10 mg Oral QHS  . digoxin  0.0625 mg Oral QODAY  . furosemide  80 mg Intravenous BID  . glyBURIDE  5 mg Oral Q  breakfast  . insulin aspart  0-20 Units Subcutaneous TID WC  . levothyroxine  75 mcg Oral QAC breakfast  . metolazone  2.5 mg Oral Once  . potassium chloride  40 mEq Oral Once  . spironolactone  12.5 mg Oral Daily    Infusions:   PRN Medications: acetaminophen, benzonatate, polyethylene glycol    Patient Profile   Raymond Manalang Jonesis a 82 y.o.malewith CAD s/p CABG, hypertension, hyperlipidemia and chronic systolic and diastolic heart failure. Readmitted 1/25 with increased dyspnea and weight gain, reports taking all meds and trying to limit sodium intake.  Assessment/Plan   1.Acute on chronic systolic CHF: Ischemic cardiomyopathy. Echo 08/26/16 with LVEF 10-15%, severe biatrial enlargement, decreased RV systolic function. Most recent echo in 12/18 with EF20-25%. [employer] ICD. He  is minimally active at baseline and is primarily wheelchair-bound due to severe arthritis. NYHA classIIIbsymptoms at admission.Recently admitted with acute/chronic systolic CHF, now back with the same.  He is volume overloaded on exam still, some diuresis yesterday but weight not down.     -Lasix 80 mg IV bid again today and will add metolazone 2.5 x 1.  - Hold Coreg with soft BP, continue spironolactone 12.5 daily.   - Continue digoxin 0.0625 every other day,level ok.    - Place ted hose.  -He has IVCD ( ). CRT will be considerationdown the roadthough benefit may not be as much as with true LBBB. -Will need to work on approval for [employer]. - Will try to get fully diuresed this admission, think he may have left a little early last admission.  2. CKD stage PHX:TAVWPVXYIA fairly stable at 1.8.  3. CAD: s/p CABG. Coronary angiography 1/18 with patent graft and no targets for revascularization. No recent chest pain.  - Continue statin, no ASA given stable CAD and apixaban use.  4. Atrial fibrillation: NSR today. No tachypalpitations.  - Continue amiodarone 200 mg daily.  - Continue Eliquis 2.5 mg BID (age, elevated creatinine).  5. Osteoarthritis: Knee OA significantly limits activity. 6. Deconditioning: Work with PT, will need rehab stay.   7. Seen by palliative care, remains full code for now.   Length of Stay: 2  Marca Ancona, MD  09/11/2017, 9:48 AM  Advanced Heart Failure Team Pager [phone] (M-F; 7a - 4p)  Please contact [facility] for night-coverage after hours (4p -7a ) and weekends on [email or web address]

## 2017-09-11 NOTE — Progress Notes (Signed)
CBG 263 tonight.Paged MD on call Vedre A.  Verbal order with read back given for NovoLOG 2 units one order time. Will continue to monitor.   Chrissie Dacquisto, RN

## 2017-09-12 DIAGNOSIS — Z515 Encounter for palliative care: Secondary | ICD-10-CM

## 2017-09-12 DIAGNOSIS — Z7189 Other specified counseling: Secondary | ICD-10-CM

## 2017-09-12 LAB — CBC WITH DIFFERENTIAL/PLATELET
Basophils Absolute: 0 10*3/uL (ref 0.0–0.1)
Basophils Relative: 0 %
Eosinophils Absolute: 0.1 10*3/uL (ref 0.0–0.7)
Eosinophils Relative: 2 %
HCT: 29.6 % — ABNORMAL LOW (ref 39.0–52.0)
Hemoglobin: 9.4 g/dL — ABNORMAL LOW (ref 13.0–17.0)
LYMPHS ABS: 0.9 10*3/uL (ref 0.7–4.0)
LYMPHS PCT: 22 %
MCH: 26.1 pg (ref 26.0–34.0)
MCHC: 31.8 g/dL (ref 30.0–36.0)
MCV: 82.2 fL (ref 78.0–100.0)
MONOS PCT: 14 %
Monocytes Absolute: 0.6 10*3/uL (ref 0.1–1.0)
NEUTROS PCT: 62 %
Neutro Abs: 2.5 10*3/uL (ref 1.7–7.7)
Platelets: 204 10*3/uL (ref 150–400)
RBC: 3.6 MIL/uL — AB (ref 4.22–5.81)
RDW: 15 % (ref 11.5–15.5)
WBC: 4.1 10*3/uL (ref 4.0–10.5)

## 2017-09-12 LAB — GLUCOSE, CAPILLARY
GLUCOSE-CAPILLARY: 239 mg/dL — AB (ref 65–99)
Glucose-Capillary: 125 mg/dL — ABNORMAL HIGH (ref 65–99)
Glucose-Capillary: 204 mg/dL — ABNORMAL HIGH (ref 65–99)
Glucose-Capillary: 211 mg/dL — ABNORMAL HIGH (ref 65–99)

## 2017-09-12 LAB — BASIC METABOLIC PANEL
Anion gap: 13 (ref 5–15)
BUN: 32 mg/dL — AB (ref 6–20)
CHLORIDE: 91 mmol/L — AB (ref 101–111)
CO2: 27 mmol/L (ref 22–32)
Calcium: 8.9 mg/dL (ref 8.9–10.3)
Creatinine, Ser: 1.75 mg/dL — ABNORMAL HIGH (ref 0.61–1.24)
GFR calc non Af Amer: 35 mL/min — ABNORMAL LOW (ref >60)
GFR, EST AFRICAN AMERICAN: 41 mL/min — AB (ref >60)
Glucose, Bld: 153 mg/dL — ABNORMAL HIGH (ref 65–99)
POTASSIUM: 3.6 mmol/L (ref 3.5–5.1)
SODIUM: 131 mmol/L — AB (ref 135–145)

## 2017-09-12 MED ORDER — METOLAZONE 2.5 MG PO TABS
2.5000 mg | ORAL_TABLET | Freq: Once | ORAL | Status: AC
  Administered 2017-09-12: 2.5 mg via ORAL
  Filled 2017-09-12: qty 1

## 2017-09-12 MED ORDER — POTASSIUM CHLORIDE CRYS ER 20 MEQ PO TBCR
40.0000 meq | EXTENDED_RELEASE_TABLET | Freq: Once | ORAL | Status: AC
  Administered 2017-09-12: 40 meq via ORAL
  Filled 2017-09-12: qty 2

## 2017-09-12 MED ORDER — CARVEDILOL 3.125 MG PO TABS
3.1250 mg | ORAL_TABLET | Freq: Two times a day (BID) | ORAL | Status: DC
  Administered 2017-09-12 – 2017-09-13 (×2): 3.125 mg via ORAL
  Filled 2017-09-12 (×2): qty 1

## 2017-09-12 NOTE — Progress Notes (Signed)
Patient very anxious and insisting that he needs to be discharged now. He has company at home and has not spent time with them and he feels that what he is doing here in the hospital can be done at home. It has been explained that the nurses can not send him home and his attending will need to make that determination. Patient states being here is making him depressed.

## 2017-09-12 NOTE — Clinical Social Work Note (Signed)
Clinical Social Work Assessment  Patient Details  Name: [name] MRN: [id number] Date of Birth: 07-14-1935  Date of referral:  09/12/17               Reason for consult:  Facility Placement, Discharge Planning                Permission sought to share information with:  Chartered certified accountant granted to share information::  Yes, Verbal Permission Granted  Name::        Agency::  SNF's  Relationship::     Contact Information:     Housing/Transportation Living arrangements for the past 2 months:  Single Family Home Source of Information:  Patient, Medical Team Patient Interpreter Needed:  None Criminal Activity/Legal Involvement Pertinent to Current Situation/Hospitalization:  No - Comment as needed Significant Relationships:  Adult Children, Other Family Members, Other(Comment)(Granddaughter and two great-grandsons) Lives with:  Relatives, Other (Comment)(Granddaughter and two great-grandsons) Do you feel safe going back to the place where you live?  Yes Need for family participation in patient care:  Yes (Comment)  Care giving concerns:  PT recommending SNF once medically stable for discharge.   Social Worker assessment / plan:  CSW met with patient. No supports at bedside. CSW introduced role and explained that PT recommendations would be discussed. Patient agreeable to SNF placement. First preference is [name] in [place]. CSW notified admissions coordinator and they can take him tomorrow. Admissions coordinator will start insurance authorization today. No further concerns. CSW encouraged patient to contact CSW as needed. CSW will continue to follow patient for support and facilitate discharge to SNF once medically stable.  Employment status:  Retired Nurse, adult PT Recommendations:  [place] / Referral to community resources:  [place]  Patient/Family's Response to care:  Patient  agreeable to SNF placement. Patient's family supportive and involved in patient's care. Patient appreciated social work intervention.  Patient/Family's Understanding of and Emotional Response to Diagnosis, Current Treatment, and Prognosis:  Patient has a good understanding of the reason for admission and his need for rehab prior to returning home. Patient appears happy with hospital care.  Emotional Assessment Appearance:  Appears stated age Attitude/Demeanor/Rapport:  Engaged, Gracious Affect (typically observed):  Accepting, Appropriate, Calm, Pleasant Orientation:  Oriented to Self, Oriented to Place, Oriented to  Time, Oriented to Situation Alcohol / Substance use:  Never Used Psych involvement (Current and /or in the community):  No (Comment)  Discharge Needs  Concerns to be addressed:  Care Coordination Readmission within the last 30 days:  Yes Current discharge risk:  Dependent with Mobility Barriers to Discharge:  Continued Medical Work up, [place], LCSW 09/12/2017, 1:38 PM

## 2017-09-12 NOTE — Progress Notes (Signed)
Visited with the patient regarding Advanced Directive.  Patient would like to to complete it and name his son as HCPOA.  We will assist him in completing this process.    09/12/17 1138  Clinical Encounter Type  Visited With Patient  Visit Type Initial;Spiritual support  Spiritual Encounters  Spiritual Needs Literature (Advanced Directive)

## 2017-09-12 NOTE — Progress Notes (Signed)
Completed Advanced Directives.  Copy placed on Patient's chart and original copy given to the patient.    09/12/17 1451  Clinical Encounter Type  Visited With Patient and family together  Visit Type Follow-up;Spiritual support  Advance Directives (For Healthcare)  Does Patient Have a Medical Advance Directive? Yes  Type of Estate agent of [place]

## 2017-09-12 NOTE — Progress Notes (Signed)
Daily Progress Note   Patient Name: [name]       Date: 09/12/2017 DOB: 1935/08/12  Age: 83 y.o. MRN#: [id number] Attending Physician: Larey Dresser, MD Primary Care Physician: Myer Peer, MD Admit Date: 09/09/2017  Reason for Consultation/Follow-up: Establishing goals of care  Subjective: [name] is alert today but confused. He is oriented at times but extremely forgetful. Asking repeatedly about going home today.   Length of Stay: 3  Current Medications: Scheduled Meds:  . amiodarone  200 mg Oral Daily  . apixaban  2.5 mg Oral BID  . atorvastatin  10 mg Oral QHS  . carvedilol  3.125 mg Oral BID WC  . digoxin  0.0625 mg Oral QODAY  . furosemide  80 mg Intravenous BID  . glyBURIDE  5 mg Oral Q breakfast  . insulin aspart  0-9 Units Subcutaneous TID WC  . levothyroxine  75 mcg Oral QAC breakfast  . potassium chloride  40 mEq Oral Once  . spironolactone  12.5 mg Oral Daily    Continuous Infusions:   PRN Meds: acetaminophen, benzonatate, polyethylene glycol  Physical Exam  Constitutional: He appears well-developed.  HENT:  Head: Normocephalic and atraumatic.  Cardiovascular: Normal rate.  Pulmonary/Chest: No accessory muscle usage. No tachypnea. No respiratory distress.  Abdominal: Normal appearance.  Neurological: He is alert. He is disoriented.  Nursing note and vitals reviewed.           Vital Signs: BP (!) 103/59 (BP Location: Right Arm)   Pulse 74   Temp 98 F (36.7 C) (Oral)   Resp 16   Ht '5\' 10"'$  (1.778 m)   Wt 89.2 kg (196 lb 10.4 oz) Comment: bed scale  SpO2 100%   BMI 28.22 kg/m  SpO2: SpO2: 100 % O2 Device: O2 Device: Not Delivered O2 Flow Rate:    Intake/output summary:   Intake/Output Summary (Last 24 hours) at 09/12/2017  1326 Last data filed at 09/12/2017 1156 Gross per 24 hour  Intake 440 ml  Output 3370 ml  Net -2930 ml   LBM: Last BM Date: 09/06/17 Baseline Weight: Weight: 94.2 kg (207 lb 10.8 oz) Most recent weight: Weight: 89.2 kg (196 lb 10.4 oz)(bed scale)       Palliative Assessment/Data: 40%    Flowsheet Rows  Most Recent Value  Intake Tab  Referral Department  Cardiology  Unit at Time of Referral  Med/Surg Unit  Palliative Care Primary Diagnosis  Cardiac  Date Notified  09/09/17  Palliative Care Type  New Palliative care  Reason for referral  Clarify Goals of Care, Advance Care Planning  Date of Admission  09/09/17  Date first seen by Palliative Care  09/11/17  # of days Palliative referral response time  2 Day(s)  # of days IP prior to Palliative referral  0  Clinical Assessment  Palliative Performance Scale Score  30%  Pain Max last 24 hours  Not able to report  Pain Min Last 24 hours  Not able to report  Dyspnea Max Last 24 Hours  Not able to report  Dyspnea Min Last 24 hours  Not able to report  Nausea Max Last 24 Hours  Not able to report  Nausea Min Last 24 Hours  Not able to report  Anxiety Max Last 24 Hours  Not able to report  Anxiety Min Last 24 Hours  Not able to report  Other Max Last 24 Hours  Not able to report  Psychosocial & Spiritual Assessment  Palliative Care Outcomes  Patient/Family meeting held?  Yes  Who was at the meeting?  patient  Palliative Care Outcomes  Clarified goals of care  Palliative Care follow-up planned  Yes, Home  Palliative Care Follow-up Reason  Clarify goals of care      Patient Active Problem List   Diagnosis Date Noted  . Palliative care encounter   . Goals of care, counseling/discussion   . Acute on chronic diastolic congestive heart failure ([place]) 09/09/2017  . Acute on chronic systolic heart failure ([place]) 08/25/2017  . CHF (congestive heart failure) ([place]) 07/19/2017  . Hyponatremia 04/02/2017  . Normocytic anemia  04/02/2017  . OA (osteoarthritis) 11/19/2016  . Paroxysmal atrial fibrillation ([place]) 09/10/2016  . CKD (chronic kidney disease), stage III ([place]) 09/10/2016  . Physical deconditioning 09/10/2016  . Single implantable cardioverter-defibrillator BSX    . Ventricular tachycardia-treated the ATP 01/29/2011  . Hx of CABG 04/17/2009  . Cardiomyopathy, ischemic 04/17/2009  . Acute on chronic systolic CHF (congestive heart failure) ([place]) 04/17/2009  . Insulin dependent diabetes mellitus ([place]) 04/12/2009  . Hyperlipidemia LDL goal <70 04/12/2009  . Essential hypertension 04/12/2009  . PUD 04/12/2009  . Rheumatoid arthritis ([place]) 04/12/2009    Palliative Care Assessment & Plan   HPI: 83 y.o. male  with past medical history of ICM with EF 12-45% and diastolic dysfunction per ECHO s/p AICD, CAD s/p CABG, CKD III, memory impairment, HTN, HLD, admitted on 09/09/2017 with acute on chronic CHF. This is patient's third hospitalization in past two months for same.    Assessment: I met today with [name] who is mainly interested in returning home. We discussed that this is not the plan for today. We discussed going to rehab from the hospital and he says he would like to go home "for a day or two first" and we explained how this is not really a good plan. He agrees to going straight to rehab from the hospital. We discuss vaguely his heart failure and declining health and he shares that he is not really concerned about this too much. He shares that he is a [place] and knows where he is going and speaks about his daughter and wife who have already passed away. He leaves big decisions up to his son.   I spoke with Mr.  Middleton' son via telephone and further explained the natural trajectory of heart failure and concern for the cycle of fluid returning. Son had many questions regarding options and expectations. We discussed that our goal is to maintain control of fluid status with po meds but when that does not work  is when patients come to the hospital for IV medications and better control. We discussed that even the potent medications at the hospital are not always enough and is dependent on renal function as well. We discussed that when the fluid cannot be managed well and QOL is declining hospice can assist with po diuretics but also transition to more comfort measures when other regimens are not effective to minimize suffering. Son would like to consider hospice after rehab but still very unsure. He voices concern that his father would be started on morphine too soon and also concern that his father would suffer. He has not made a decision regarding code status but leaning towards DNR per our conversation. We did not discuss ICD as son seemed overwhelmed already with above information. Will continue to follow and discuss.   Recommendations/Plan:  Per heart failure team.   Goals of Care and Additional Recommendations:  Limitations on Scope of Treatment: Full Scope Treatment  Code Status:  Full code - family considering DNR (which is more aligned with their [place])  Prognosis:   Unable to determine - likely < 6 months with progressing CHF  Discharge Planning:  [place] for rehab with Palliative care service follow-up   Thank you for allowing the Palliative Medicine Team to assist in the care of this patient.   Total Time 50 min Prolonged Time Billed  no       Greater than 50%  of this time was spent counseling and coordinating care related to the above assessment and plan.  Vinie Sill, NP Palliative Medicine Team Pager # [phone] (M-F 8a-5p) Team Phone # [phone] (Nights/Weekends)

## 2017-09-12 NOTE — Progress Notes (Signed)
Spoke with patient in regards to his diabetes and medications. Patient was not sure about a V-go insulin pump that he has worn in the past. Noted that there was not a V-go attached to him. He could not remember how, of if, he used one. Then asked what happened to it! Seems very forgetful and not able to maneuver a V-go disposable pump. Will need to be followed with PCP on what medication to take for his diabetes once he goes home. States that his son helps him with his medications. Will continue to monitor blood sugars while in the hospital.   Smith Mince RN BSN CDE Diabetes Coordinator Pager: [phone]  8am-5pm

## 2017-09-12 NOTE — Clinical Social Work Placement (Signed)
   CLINICAL SOCIAL WORK PLACEMENT  NOTE  Date:  09/12/2017  Patient Details  Name: [name] MRN: [id number] Date of Birth: [birth year]  Clinical Social Work is seeking post-discharge placement for this patient at the Skilled  Nursing Facility level of care (*CSW will initial, date and re-position this form in  chart as items are completed):  Yes   Patient/family provided with [facility] Clinical Social Work Department's list of facilities offering this level of care within the geographic area requested by the patient (or if unable, by the patient's family).  Yes   Patient/family informed of their freedom to choose among providers that offer the needed level of care, that participate in Medicare, Medicaid or managed care program needed by the patient, have an available bed and are willing to accept the patient.  Yes   Patient/family informed of [facility]'s ownership interest in [facility] and [facility], as well as of the fact that they are under no obligation to receive care at these facilities.  PASRR submitted to EDS on 09/10/17     PASRR number received on       Existing PASRR number confirmed on 09/10/17     FL2 transmitted to all facilities in geographic area requested by pt/family on 09/10/17     FL2 transmitted to all facilities within larger geographic area on       Patient informed that his/her managed care company has contracts with or will negotiate with certain facilities, including the following:        Yes   Patient/family informed of bed offers received.  Patient chooses bed at [facility], [facility]     Physician recommends and patient chooses bed at      Patient to be transferred to [name], [place] on  .  Patient to be transferred to facility by       Patient family notified on   of transfer.  Name of family member notified:        PHYSICIAN       Additional Comment:    _______________________________________________ Margarito Liner,  LCSW 09/12/2017, 1:42 PM

## 2017-09-12 NOTE — Progress Notes (Signed)
Patient ID: [name], male   DOB: 11-Dec-1934, 83 y.o.   MRN: [id number]     Advanced Heart Failure Rounding Note   HF Cardiology: [name]  Subjective:    -2.7 liters and down 9 lbs since yesterday. BMP pending this am.   No SOB, CP, or dizziness. Wants to go home.   Palliative NP saw pt yesterday. He and family need some time to decide on code status, but are not wanting to pursue aggressive measures. They are open to SNF for rehab vs hospice at home. Son will be back today.    Objective:   Weight Range: 196 lb 10.4 oz (89.2 kg) Body mass index is 28.22 kg/m.   Vital Signs:   Temp:  [97.6 F (36.4 C)-98.3 F (36.8 C)] 98.1 F (36.7 C) (01/28 0653) Pulse Rate:  [70-73] 72 (01/28 0653) Resp:  [16-18] 16 (01/28 0653) BP: (99-112)/(54-61) 105/54 (01/28 0653) SpO2:  [97 %-100 %] 97 % (01/28 0653) Weight:  [196 lb 10.4 oz (89.2 kg)] 196 lb 10.4 oz (89.2 kg) (01/28 0653) Last BM Date: 09/06/17  Weight change: Filed Weights   09/10/17 0553 09/11/17 0440 09/12/17 0653  Weight: 205 lb 14.6 oz (93.4 kg) 205 lb 4 oz (93.1 kg) 196 lb 10.4 oz (89.2 kg)    Intake/Output:   Intake/Output Summary (Last 24 hours) at 09/12/2017 0800 Last data filed at 09/12/2017 0602 Gross per 24 hour  Intake 540 ml  Output 3275 ml  Net -2735 ml      Physical Exam  General: No resp difficulty. HEENT: Normal Neck: Supple. JVP 14+. Carotids 2+ bilat; no bruits. No thyromegaly or nodule noted. Cor: PMI nondisplaced. RRR, No M/G/R noted Lungs: fine crackles bilateral bases Abdomen: Soft, non-tender, non-distended, no HSM. No bruits or masses. +BS  Extremities: No cyanosis, clubbing, or rash. R and LLE 1+ non-pitting edema. TED nose on bilaterally Neuro: Alert & orientedx3, cranial nerves grossly intact. moves all 4 extremities w/o difficulty. Affect pleasant   Telemetry   SR with IVCD and frequent PVC's (2-10/min.)  Labs    CBC No results for input(s): WBC, NEUTROABS, HGB, HCT,  MCV, PLT in the last 72 hours. Basic Metabolic Panel Recent Labs    81/85/63 0714 09/11/17 0707  NA 130* 130*  K 3.5 3.6  CL 92* 92*  CO2 25 27  GLUCOSE 125* 137*  BUN 33* 31*  CREATININE 1.67* 1.81*  CALCIUM 8.6* 8.6*   Liver Function Tests No results for input(s): AST, ALT, ALKPHOS, BILITOT, PROT, ALBUMIN in the last 72 hours. No results for input(s): LIPASE, AMYLASE in the last 72 hours. Cardiac Enzymes No results for input(s): CKTOTAL, CKMB, CKMBINDEX, TROPONINI in the last 72 hours.  BNP: BNP (last 3 results) Recent Labs    07/19/17 1819 08/25/17 1140 09/09/17 0525  BNP 926.3* 1,102.9* 1,105.4*    ProBNP (last 3 results) No results for input(s): PROBNP in the last 8760 hours.   D-Dimer No results for input(s): DDIMER in the last 72 hours. Hemoglobin A1C No results for input(s): HGBA1C in the last 72 hours. Fasting Lipid Panel No results for input(s): CHOL, HDL, LDLCALC, TRIG, CHOLHDL, LDLDIRECT in the last 72 hours. Thyroid Function Tests No results for input(s): TSH, T4TOTAL, T3FREE, THYROIDAB in the last 72 hours.  Invalid input(s): FREET3  Other results:   Imaging    No results found.   Medications:     Scheduled Medications: . amiodarone  200 mg Oral Daily  . apixaban  2.5 mg Oral BID  . atorvastatin  10 mg Oral QHS  . digoxin  0.0625 mg Oral QODAY  . furosemide  80 mg Intravenous BID  . glyBURIDE  5 mg Oral Q breakfast  . insulin aspart  0-9 Units Subcutaneous TID WC  . levothyroxine  75 mcg Oral QAC breakfast  . spironolactone  12.5 mg Oral Daily    Infusions:   PRN Medications: acetaminophen, benzonatate, polyethylene glycol    Patient Profile   Raymond Stailey Jonesis a 82 y.o.malewith CAD s/p CABG, hypertension, hyperlipidemia and chronic systolic and diastolic heart failure. Readmitted 1/25 with increased dyspnea and weight gain, reports taking all meds and trying to limit sodium intake.  Assessment/Plan   1.Acute on  chronic systolic CHF: Ischemic cardiomyopathy. Echo 08/26/16 with LVEF 10-15%, severe biatrial enlargement, decreased RV systolic function. Most recent echo in 12/18 with EF20-25%. [employer] ICD. He is minimally active at baseline and is primarily wheelchair-bound due to severe arthritis. NYHA classIIIbsymptoms at admission.Recently admitted with acute/chronic systolic CHF, now back with the same.   Volume status improving. Weight is down below previous DC weight.    -Lasix 80 mg IV bid. Consider switching to PO soon. Got metolazone 2.5 yesterday.  - Hold Coreg with soft BP, continue spironolactone 12.5 daily.   - Continue digoxin 0.0625 every other day,level ok.    - Place ted hose.  -He has IVCD ( ).  - Will try to get fully diuresed this admission, think he may have left a little early last admission.  2. CKD stage JHE:[id number] fairly stable at 1.8 yesterday. BMP pending this am. 3. CAD: s/p CABG. Coronary angiography 1/18 with patent graft and no targets for revascularization. No CP. - Continue statin, no ASA given stable CAD and apixaban use.  4. Atrial fibrillation: Still in NSR. No palpitations/lightheadedness - Continue amiodarone 200 mg daily.  - Continue Eliquis 2.5 mg BID (age, elevated creatinine).  5. Osteoarthritis: Knee OA significantly limits activity.No change. 6. Deconditioning: Work with PT, will need rehab stay vs home hopsice  7. Seen by palliative care. Family to decide on code status likely today.  Length of Stay: 3  Alford Highland, NP  09/12/2017, 8:00 AM  Advanced Heart Failure Team Pager [phone] (M-F; 7a - 4p)  Please contact [facility] for night-coverage after hours (4p -7a ) and weekends on [email or web address]  Patient seen with NP, agree with the above note.  He remains volume overloaded on exam but is very insistent that he leave the hospital soon, apparently has family in from [place].  He was recently admitted and  discharge before full diuresis as he insisted on going home.  I think that if we send him home today he will be back in the ER soon with dyspnea.  We compromised and will attempt to get him home tomorrow.  He did diurese well yesterday on current regimen and weight is down.  - Lasix 80 mg IV bid again today with metolazone 2.5 mg x 1.  Still pending labs.  - BP better, can restart low dose Coreg 3.125 mg bid.   Palliative care to discuss code status with him.   Marca Ancona 09/12/2017 9:54 AM

## 2017-09-13 LAB — CBC WITH DIFFERENTIAL/PLATELET
Basophils Absolute: 0 10*3/uL (ref 0.0–0.1)
Basophils Relative: 0 %
EOS ABS: 0.1 10*3/uL (ref 0.0–0.7)
EOS PCT: 3 %
HCT: 29 % — ABNORMAL LOW (ref 39.0–52.0)
HEMOGLOBIN: 9.2 g/dL — AB (ref 13.0–17.0)
Lymphocytes Relative: 31 %
Lymphs Abs: 1.2 10*3/uL (ref 0.7–4.0)
MCH: 25.8 pg — ABNORMAL LOW (ref 26.0–34.0)
MCHC: 31.7 g/dL (ref 30.0–36.0)
MCV: 81.5 fL (ref 78.0–100.0)
Monocytes Absolute: 0.4 10*3/uL (ref 0.1–1.0)
Monocytes Relative: 10 %
NEUTROS PCT: 56 %
Neutro Abs: 2.1 10*3/uL (ref 1.7–7.7)
PLATELETS: 192 10*3/uL (ref 150–400)
RBC: 3.56 MIL/uL — AB (ref 4.22–5.81)
RDW: 14.8 % (ref 11.5–15.5)
WBC: 3.8 10*3/uL — AB (ref 4.0–10.5)

## 2017-09-13 LAB — GLUCOSE, CAPILLARY
GLUCOSE-CAPILLARY: 179 mg/dL — AB (ref 65–99)
Glucose-Capillary: 142 mg/dL — ABNORMAL HIGH (ref 65–99)

## 2017-09-13 LAB — BASIC METABOLIC PANEL
Anion gap: 11 (ref 5–15)
BUN: 31 mg/dL — AB (ref 6–20)
CALCIUM: 8.9 mg/dL (ref 8.9–10.3)
CO2: 31 mmol/L (ref 22–32)
Chloride: 89 mmol/L — ABNORMAL LOW (ref 101–111)
Creatinine, Ser: 1.89 mg/dL — ABNORMAL HIGH (ref 0.61–1.24)
GFR calc non Af Amer: 32 mL/min — ABNORMAL LOW (ref >60)
GFR, EST AFRICAN AMERICAN: 37 mL/min — AB (ref >60)
GLUCOSE: 168 mg/dL — AB (ref 65–99)
Potassium: 3.4 mmol/L — ABNORMAL LOW (ref 3.5–5.1)
SODIUM: 131 mmol/L — AB (ref 135–145)

## 2017-09-13 LAB — MAGNESIUM: Magnesium: 2 mg/dL (ref 1.7–2.4)

## 2017-09-13 MED ORDER — METOLAZONE 2.5 MG PO TABS: 2.5000 mg | ORAL_TABLET | ORAL | 3 refills | Status: DC

## 2017-09-13 MED ORDER — POTASSIUM CHLORIDE CRYS ER 20 MEQ PO TBCR
60.0000 meq | EXTENDED_RELEASE_TABLET | Freq: Once | ORAL | Status: AC
  Administered 2017-09-13: 60 meq via ORAL
  Filled 2017-09-13: qty 3

## 2017-09-13 MED ORDER — METOLAZONE 2.5 MG PO TABS
2.5000 mg | ORAL_TABLET | Freq: Once | ORAL | Status: AC
  Administered 2017-09-13: 2.5 mg via ORAL
  Filled 2017-09-13: qty 1

## 2017-09-13 MED ORDER — POTASSIUM CHLORIDE CRYS ER 20 MEQ PO TBCR: 20.0000 meq | EXTENDED_RELEASE_TABLET | Freq: Every day | ORAL | 6 refills | Status: DC

## 2017-09-13 NOTE — Progress Notes (Addendum)
Patient ID: [name], male   DOB: May 21, 1935, 83 y.o.   MRN: [id number]     Advanced Heart Failure Rounding Note   HF Cardiology: [name]  Subjective:    -2.7 liters and down 3 lbs from yesterday with 80 IV lasix BID + metolazone 2.5 x1. Creatinine 1.89  No CP, SOB, or dizziness. Less restless today.   Discharge planning for [name] in [place] for rehab. Possibly hospice after. No decision yet regarding code status.   Objective:   Weight Range: 193 lb 9 oz (87.8 kg) Body mass index is 27.77 kg/m.   Vital Signs:   Temp:  [97.2 F (36.2 C)-98.2 F (36.8 C)] 97.2 F (36.2 C) (01/29 0616) Pulse Rate:  [57-76] 57 (01/29 0616) Resp:  [16-17] 17 (01/28 2240) BP: (97-103)/(51-59) 100/52 (01/29 0616) SpO2:  [96 %-100 %] 96 % (01/29 0616) Weight:  [193 lb 9 oz (87.8 kg)] 193 lb 9 oz (87.8 kg) (01/29 0621) Last BM Date: 09/12/17  Weight change: Filed Weights   09/11/17 0440 09/12/17 0653 09/13/17 0621  Weight: 205 lb 4 oz (93.1 kg) 196 lb 10.4 oz (89.2 kg) 193 lb 9 oz (87.8 kg)    Intake/Output:   Intake/Output Summary (Last 24 hours) at 09/13/2017 0731 Last data filed at 09/13/2017 0641 Gross per 24 hour  Intake 440 ml  Output 3215 ml  Net -2775 ml      Physical Exam  General:No resp difficulty. HEENT: Normal Neck: Supple. JVP ~12. Carotids 2+ bilat; no bruits. No thyromegaly or nodule noted. Cor: PMI nondisplaced. RRR, No M/G/R noted Lungs: CTAB, normal effort. Abdomen: Soft, non-tender, non-distended, no HSM. No bruits or masses. +BS  Extremities: No cyanosis, clubbing, or rash. R and LLE no edema. TED hose on bilaterally Neuro: Alert & orientedx3, cranial nerves grossly intact. moves all 4 extremities w/o difficulty. Affect pleasant   Telemetry   SR 60's with 1st degree AV block and IVCD and PVC's (2-8/min). Personally reviewed.  Labs    CBC Recent Labs    09/12/17 0808 09/13/17 0520  WBC 4.1 3.8*  NEUTROABS 2.5 2.1  HGB 9.4* 9.2*  HCT 29.6*  29.0*  MCV 82.2 81.5  PLT 204 192   Basic Metabolic Panel Recent Labs    82/99/[birth year] 0808 09/13/17 0520  NA 131* 131*  K 3.6 3.4*  CL 91* 89*  CO2 27 31  GLUCOSE 153* 168*  BUN 32* 31*  CREATININE 1.75* 1.89*  CALCIUM 8.9 8.9  MG  --  2.0   Liver Function Tests No results for input(s): AST, ALT, ALKPHOS, BILITOT, PROT, ALBUMIN in the last 72 hours. No results for input(s): LIPASE, AMYLASE in the last 72 hours. Cardiac Enzymes No results for input(s): CKTOTAL, CKMB, CKMBINDEX, TROPONINI in the last 72 hours.  BNP: BNP (last 3 results) Recent Labs    07/19/17 1819 08/25/17 1140 09/09/17 0525  BNP 926.3* 1,102.9* 1,105.4*    ProBNP (last 3 results) No results for input(s): PROBNP in the last 8760 hours.   D-Dimer No results for input(s): DDIMER in the last 72 hours. Hemoglobin A1C No results for input(s): HGBA1C in the last 72 hours. Fasting Lipid Panel No results for input(s): CHOL, HDL, LDLCALC, TRIG, CHOLHDL, LDLDIRECT in the last 72 hours. Thyroid Function Tests No results for input(s): TSH, T4TOTAL, T3FREE, THYROIDAB in the last 72 hours.  Invalid input(s): FREET3  Other results:   Imaging    No results found.   Medications:     Scheduled Medications: .  amiodarone  200 mg Oral Daily  . apixaban  2.5 mg Oral BID  . atorvastatin  10 mg Oral QHS  . carvedilol  3.125 mg Oral BID WC  . digoxin  0.0625 mg Oral QODAY  . furosemide  80 mg Intravenous BID  . glyBURIDE  5 mg Oral Q breakfast  . insulin aspart  0-9 Units Subcutaneous TID WC  . levothyroxine  75 mcg Oral QAC breakfast  . spironolactone  12.5 mg Oral Daily    Infusions:   PRN Medications: acetaminophen, benzonatate, polyethylene glycol    Patient Profile   Aslan Himes Jonesis a 82 y.o.malewith CAD s/p CABG, hypertension, hyperlipidemia and chronic systolic and diastolic heart failure. Readmitted 1/25 with increased dyspnea and weight gain, reports taking all meds and trying to  limit sodium intake.  Assessment/Plan   1.Acute on chronic systolic CHF: Ischemic cardiomyopathy. Echo 08/26/16 with LVEF 10-15%, severe biatrial enlargement, decreased RV systolic function. Most recent echo in 12/18 with EF20-25%. [employer] ICD. He is minimally active at baseline and is primarily wheelchair-bound due to severe arthritis. NYHA classIIIbsymptoms at admission.Recently admitted with acute/chronic systolic CHF, now back with the same.   - Volume status improved. Weight is 11lbs down below previous DC weight (bed weights).    -Getting Lasix 80 mg IV bid. Got metolazone 2.5 x1 yesterday. Consider switching to home dose of torsemide 100 mg BID today. - Continue spironolactone 12.5 daily.   - Continue digoxin 0.0625 every other day,level ok.    - Coreg resumed yesterday. SBP 90-100's, HR 60's. - Will try to get fully diuresed this admission, think he may have left a little early last admission.  2. CKD stage [id number]:[id number] fairly stable at 1.89 this morning. Monitor daily BMET. 3. CAD: s/p CABG. Coronary angiography 1/18 with patent graft and no targets for revascularization. No s/s ischemia. - Continue statin, no ASA given stable CAD and apixaban use.  4. Atrial fibrillation: Continues to be in NSR. No palpitations or lightheadedness. - Continue amiodarone 200 mg daily.  - Continue Eliquis 2.5 mg BID (age, elevated creatinine).  5. Osteoarthritis: Knee OA significantly limits activity.No change. No complaints of pain this morning. 6. Deconditioning: Work with PT. Plan for [name] for rehab at [place]. They are ready to accept him today. 7. Seen by palliative care. Family has not made decision regarding code status.  8. Hypokalemia - K is 3.4 this morning. Will supplement. Discussed with PharmD. Monitor daily BMET  Length of Stay: 4  Alford Highland, NP  09/13/2017, 7:31 AM  Advanced Heart Failure Team Pager [phone] (M-F; 7a - 4p)  Please contact [facility] for night-coverage after hours (4p -7a ) and weekends on [email or web address]  Patient seen with NP, agree with the above note.  He is down about 14 lbs since admission but still volume overloaded on exam.  BUN/creatinine fairly stable.  He is very insistent about leaving for [name] in [place] so he can visit with family in from [place].  I will give him a dose of metolazone along with IV Lasix this morning and let him go this afternoon.  I remain concerned that he is at high risk for readmission.   He will need close followup, will arrange appt in CHF clinic next week for him.  Meds for home: Coreg 3.125 mg bid, apixaban 2.5 mg bid, digoxin 0.0625 qod, spironolactone 12.5 daily, amiodarone 200 daily, atorvastatin 10 daily, KCl 20 daily, torsemide 100 mg bid, metolazone 2.5 once a  week (needs to take an extra 20 mEq KCl on metolazone days).    He would probably be a good [facility] candidate, would work on setting this up.   Marca Ancona 09/13/2017 8:40 AM

## 2017-09-13 NOTE — Clinical Social Work Placement (Signed)
   CLINICAL SOCIAL WORK PLACEMENT  NOTE  Date:  09/13/2017  Patient Details  Name: [name] MRN: [id number] Date of Birth: 01/31/1935  Clinical Social Work is seeking post-discharge placement for this patient at the Skilled  Nursing Facility level of care (*CSW will initial, date and re-position this form in  chart as items are completed):  Yes   Patient/family provided with [place] Clinical Social Work Department's list of facilities offering this level of care within the geographic area requested by the patient (or if unable, by the patient's family).  Yes   Patient/family informed of their freedom to choose among providers that offer the needed level of care, that participate in Medicare, Medicaid or managed care program needed by the patient, have an available bed and are willing to accept the patient.  Yes   Patient/family informed of [place]'s ownership interest in [facility] and [facility], as well as of the fact that they are under no obligation to receive care at these facilities.  PASRR submitted to EDS on 09/10/17     PASRR number received on       Existing PASRR number confirmed on 09/10/17     FL2 transmitted to all facilities in geographic area requested by pt/family on 09/10/17     FL2 transmitted to all facilities within larger geographic area on       Patient informed that his/her managed care company has contracts with or will negotiate with certain facilities, including the following:        Yes   Patient/family informed of bed offers received.  Patient chooses bed at [name]     Physician recommends and patient chooses bed at      Patient to be transferred to [name], [place] on 09/13/17.  Patient to be transferred to facility by [facility]     Patient family notified on 09/13/17 of transfer.  Name of family member notified:  Patient will call his son.     PHYSICIAN Please prepare prescriptions     Additional Comment:     _______________________________________________ Margarito Liner, LCSW 09/13/2017, 10:11 AM

## 2017-09-13 NOTE — Progress Notes (Signed)
Daily Progress Note   Patient Name: [name]       Date: 09/13/2017 DOB: Apr 28, 1935  Age: 83 y.o. MRN#: [id number] Attending Physician: Laurey Morale, MD Primary Care Physician: [name], MD Admit Date: 09/09/2017  Reason for Consultation/Follow-up: Establishing goals of care  Subjective: Feeling better. Happy to be discharging today although he would rather return to his home.   Length of Stay: 4  Current Medications: Scheduled Meds:  . amiodarone  200 mg Oral Daily  . apixaban  2.5 mg Oral BID  . atorvastatin  10 mg Oral QHS  . carvedilol  3.125 mg Oral BID WC  . digoxin  0.0625 mg Oral QODAY  . furosemide  80 mg Intravenous BID  . glyBURIDE  5 mg Oral Q breakfast  . insulin aspart  0-9 Units Subcutaneous TID WC  . levothyroxine  75 mcg Oral QAC breakfast  . spironolactone  12.5 mg Oral Daily    Continuous Infusions:   PRN Meds: acetaminophen, benzonatate, polyethylene glycol  Physical Exam  Constitutional: He appears well-developed.  HENT:  Head: Normocephalic and atraumatic.  Cardiovascular: Normal rate.  Pulmonary/Chest: No accessory muscle usage. No tachypnea. No respiratory distress.  Abdominal: Normal appearance.  Neurological: He is alert. He is disoriented.  Nursing note and vitals reviewed.           Vital Signs: BP (!) 97/49 (BP Location: Right Arm)   Pulse 68   Temp 97.6 F (36.4 C) (Oral)   Resp 18   Ht 5\' 10"  (1.778 m)   Wt 87.8 kg (193 lb 9 oz) Comment: bed  SpO2 100%   BMI 27.77 kg/m  SpO2: SpO2: 100 % O2 Device: O2 Device: Not Delivered O2 Flow Rate:    Intake/output summary:   Intake/Output Summary (Last 24 hours) at 09/13/2017 1306 Last data filed at 09/13/2017 1206 Gross per 24 hour  Intake 360 ml  Output 3570 ml  Net  -3210 ml   LBM: Last BM Date: 09/13/17 Baseline Weight: Weight: 94.2 kg (207 lb 10.8 oz) Most recent weight: Weight: 87.8 kg (193 lb 9 oz)(bed)       Palliative Assessment/Data: 40%    Flowsheet Rows     Most Recent Value  Intake Tab  Referral Department  Cardiology  Unit at Time of Referral  Med/Surg Unit  Palliative Care Primary Diagnosis  Cardiac  Date Notified  09/09/17  Palliative Care Type  New Palliative care  Reason for referral  Clarify Goals of Care, Advance Care Planning  Date of Admission  09/09/17  Date first seen by Palliative Care  09/11/17  # of days Palliative referral response time  2 Day(s)  # of days IP prior to Palliative referral  0  Clinical Assessment  Palliative Performance Scale Score  30%  Pain Max last 24 hours  Not able to report  Pain Min Last 24 hours  Not able to report  Dyspnea Max Last 24 Hours  Not able to report  Dyspnea Min Last 24 hours  Not able to report  Nausea Max Last 24 Hours  Not able to report  Nausea Min Last 24 Hours  Not able to report  Anxiety Max Last 24 Hours  Not able to report  Anxiety Min Last 24 Hours  Not able to report  Other Max Last 24 Hours  Not able to report  Psychosocial & Spiritual Assessment  Palliative Care Outcomes  Patient/Family meeting held?  Yes  Who was at the meeting?  patient  Palliative Care Outcomes  Clarified goals of care  Palliative Care follow-up planned  Yes, Home  Palliative Care Follow-up Reason  Clarify goals of care      Patient Active Problem List   Diagnosis Date Noted  . Palliative care encounter   . Goals of care, counseling/discussion   . Acute on chronic diastolic congestive heart failure (HCC) 09/09/2017  . Acute on chronic systolic heart failure (HCC) 08/25/2017  . CHF (congestive heart failure) (HCC) 07/19/2017  . Hyponatremia 04/02/2017  . Normocytic anemia 04/02/2017  . OA (osteoarthritis) 11/19/2016  . Paroxysmal atrial fibrillation (HCC) 09/10/2016  . CKD  (chronic kidney disease), stage III (HCC) 09/10/2016  . Physical deconditioning 09/10/2016  . Single implantable cardioverter-defibrillator BSX    . Ventricular tachycardia-treated the ATP 01/29/2011  . Hx of CABG 04/17/2009  . Cardiomyopathy, ischemic 04/17/2009  . Acute on chronic systolic CHF (congestive heart failure) (HCC) 04/17/2009  . Insulin dependent diabetes mellitus (HCC) 04/12/2009  . Hyperlipidemia LDL goal <70 04/12/2009  . Essential hypertension 04/12/2009  . PUD 04/12/2009  . Rheumatoid arthritis (HCC) 04/12/2009    Palliative Care Assessment & Plan   HPI: 83 y.o. male  with past medical history of ICM with EF 20-25% and diastolic dysfunction per ECHO s/p AICD, CAD s/p CABG, CKD III, memory impairment, HTN, HLD, admitted on 09/09/2017 with acute on chronic CHF. This is patient's third hospitalization in past two months for same.    Assessment: I spoke again with [name] son whose questions revolve more around social work. He has yet to make a decision on code status. I reiterated why it is important to consider DNR and why we recommend this. I explained that if they decide they desire DNR to notify any of his father's providers. Would benefit from palliative follow up. Emotional support provided. All questions/concerns addressed.   Recommendations/Plan:  Per heart failure team.   Goals of Care and Additional Recommendations:  Limitations on Scope of Treatment: Full Scope Treatment  Code Status:  Full code - family considering DNR (which is more aligned with their GOC)  Prognosis:   Unable to determine - likely < 6 months with progressing CHF  Discharge Planning:  Skilled Nursing Facility for rehab with Palliative care service follow-up   Thank you for allowing the Palliative Medicine  Team to assist in the care of this patient.   Total Time 15 min Prolonged Time Billed  no       Greater than 50%  of this time was spent counseling and coordinating care  related to the above assessment and plan.  Yong Channel, NP Palliative Medicine Team Pager # [phone] (M-F 8a-5p) Team Phone # [phone] (Nights/Weekends)

## 2017-09-13 NOTE — Discharge Summary (Signed)
Advanced Heart Failure Discharge Note  Discharge Summary   Patient ID: [name] MRN: [id number], DOB/AGE: 09-13-1934 83 y.o. Admit date: 09/09/2017 D/C date:     09/13/2017   Primary Discharge Diagnoses:  1.Acute on chronic systolic CHF: Ischemic cardiomyopathy. Echo 08/26/16 with LVEF 10-15%, severe biatrial enlargement, decreased RV systolic function.  2. CKD stage III 3. CAD: s/p CABG 4. Atrial fibrillation 5. Osteoarthritis 6. Deconditioning 7. Hypokalemia  Hospital Course:   Raymond Boughner Jonesis a 83 y.o.malewith CAD s/p CABG, hypertension, hyperlipidemia and chronic systolic and diastolic heart failure. Readmitted 1/25 with increased dyspnea and weight gain, reports taking all meds and trying to limit sodium intake.  Problem based hospital course as below.   1.Acute on chronic systolic CHF: Ischemic cardiomyopathy. Echo 08/26/16 with LVEF 10-15%, severe biatrial enlargement, decreased RV systolic function. Most recent echo in 12/18 with EF20-25%. [place]. He is minimally active at baseline and is primarily wheelchair-bounddue to severe arthritis. NYHA classIIIbsymptomsat admission.Recently admitted with acute/chronic systolic CHF, now back with the same.   - Diuresed on IV lasix. Weight down 11 lbs from previous DC weight (bed weights.) Meds adjusted for home.  2. CKD stage III: - Fairly stable with diuresis this admission.  3. CAD: s/p CABG. Coronary angiography 1/18 with patent graft and no targets for revascularization. No s/s ischemia. No change.  4. Atrial fibrillation: Remains in NSR on po amiodarone. Eliquis continued.  5. Osteoarthritis: Knee OA significantly limits activity. To SNF on discharge as below.  6. Deconditioning: Work with PT. Plan for [name] for rehab at DC.  7. Prognosis: Seen by palliative care. Family has not made decision regarding code status. Will see how much he can rehab.  8. Hypokalemia - Supped on day of  discharge and will have close follow up as below.   Examined this am and thought stable for discharge to SNF with meds as below. We will follow in HF clinic next week, and weekly x 4.   Discharge Weight Range: 193 lbs Discharge Vitals: Blood pressure (!) 98/56, pulse 71, temperature (!) 97.2 F (36.2 C), temperature source Oral, resp. rate 17, height _0  (1.778 m), weight 193 lb 9 oz (87.8 kg), SpO2 100 %.  Labs: Lab Results  Component Value Date   WBC 3.8 (L) 09/13/2017   HGB 9.2 (L) 09/13/2017   HCT 29.0 (L) 09/13/2017   MCV 81.5 09/13/2017   PLT 192 09/13/2017    Recent Labs  Lab 09/13/17 0520  NA 131*  K 3.4*  CL 89*  CO2 31  BUN 31*  CREATININE 1.89*  CALCIUM 8.9  GLUCOSE 168*   Lab Results  Component Value Date   CHOL 93 07/29/2017   HDL 26 (L) 07/29/2017   LDLCALC 12 07/29/2017   TRIG 275 (H) 07/29/2017   BNP (last 3 results) Recent Labs    07/19/17 1819 08/25/17 1140 09/09/17 0525  BNP 926.3* 1,102.9* 1,105.4*    ProBNP (last 3 results) No results for input(s): PROBNP in the last 8760 hours.   Diagnostic Studies/Procedures   No results found.  Discharge Medications   Allergies as of 09/13/2017      Reactions   Metformin And Related Other (See Comments)   "Made sugar go wild"   Rivastigmine Nausea And Vomiting   NO EXELON PATCHES!!      Medication List    TAKE these medications   amiodarone 200 MG tablet Commonly known as:  PACERONE Take 1 tablet (200 mg total)  by mouth daily.   apixaban 2.5 MG Tabs tablet Commonly known as:  ELIQUIS Take 1 tablet (2.5 mg total) 2 (two) times daily by mouth.   carvedilol 3.125 MG tablet Commonly known as:  COREG Take 3.125 mg by mouth 2 (two) times daily with a meal.   digoxin 0.125 MG tablet Commonly known as:  LANOXIN Take 0.5 tablets (0.0625 mg total) by mouth every other day.   glyBURIDE 5 MG tablet Commonly known as:  DIABETA Take 5 mg by mouth daily with breakfast.   HUMULIN R 100  units/mL injection Generic drug:  insulin regular USE VIA PUMP AS DIRECTED WITH V-GO MACHINE AS DIRECTED. MAX DD OF 66 UNITS   hydrocortisone 2.5 % cream Apply 1 application topically daily. Apply to external ear canals, only as needed for itching   levothyroxine 75 MCG tablet Commonly known as:  SYNTHROID, LEVOTHROID Take 1 tablet (75 mcg total) by mouth daily before breakfast.   LIPITOR 10 MG tablet Generic drug:  atorvastatin Take 10 mg by mouth at bedtime.   metolazone 2.5 MG tablet Commonly known as:  ZAROXOLYN Take 1 tablet (2.5 mg total) by mouth once a week. Thursdays.   NEXIUM 40 MG capsule Generic drug:  esomeprazole Take 40 mg by mouth daily.   polyethylene glycol packet Commonly known as:  MIRALAX / GLYCOLAX Take 17 g by mouth daily as needed for mild constipation.   potassium chloride SA 20 MEQ tablet Commonly known as:  K-DUR,KLOR-CON Take 1 tablet (20 mEq total) by mouth daily. Take extra 20 meq on metolazone days. What changed:  additional instructions   spironolactone 25 MG tablet Commonly known as:  ALDACTONE Take 0.5 tablets (12.5 mg total) by mouth daily.   torsemide 100 MG tablet Commonly known as:  DEMADEX Take 1 tablet (100 mg total) by mouth 2 (two) times daily.   V-GO 30 Kit daily.       Disposition   The patient will be discharged in tenuous but stable condition to SNF.  Discharge Instructions    (HEART FAILURE PATIENTS) Call MD:  Anytime you have any of the following symptoms: 1) 3 pound weight gain in 24 hours or 5 pounds in 1 week 2) shortness of breath, with or without a dry hacking cough 3) swelling in the hands, feet or stomach 4) if you have to sleep on extra pillows at night in order to breathe.   Complete by:  As directed    Diet - low sodium heart healthy   Complete by:  As directed    Increase activity slowly   Complete by:  As directed    STOP any activity that causes chest pain, shortness of breath, dizziness, sweating,  or exessive weakness   Complete by:  As directed       Contact information for follow-up providers    [facility] Follow up on 09/20/2017.   Specialty:  Cardiology Why:  at 1000 am for post hospital follow up. Code for parking is 9001. Can also park in lower ED lot and enter blue awning.  Contact information: [address] [id number] [place] [phone]           Contact information for after-discharge care    Destination    [name] Follow up.   Service:  Skilled Chiropodist information: [facility] [place] [phone]  Duration of Discharge Encounter: Greater than 35 minutes   Signed, Shirley Friar, PA-C 09/13/2017, 9:46 AM

## 2017-09-13 NOTE — Clinical Social Work Note (Signed)
CSW facilitated patient discharge including contacting facility to confirm patient discharge plans. Patient stated he would call his son to notify of plan. Clinical information faxed to facility and family agreeable with plan. CSW arranged ambulance transport via [facility] to [name] [place] at 1:30 pm. RN to call report prior to discharge [phone] ext [phone], Room 608).  CSW will sign off for now as social work intervention is no longer needed. Please consult [place] again if new needs arise.  Charlynn Court, CSW [phone]

## 2017-09-13 NOTE — Progress Notes (Signed)
Pt is alert and verbal forgetful at times, is been discharge to [name] rehab, RN called in report to the nurse there, IV and tele d/c, awaiting [facility] for transport to the facility.

## 2017-09-13 NOTE — Care Management Important Message (Signed)
Important Message  Patient Details  Name: [name] MRN: [id number] Date of Birth: May 23, 1935   Medicare Important Message Given:  Yes    Dorena Bodo 09/13/2017, 12:13 PM

## 2017-09-20 ENCOUNTER — Encounter (HOSPITAL_COMMUNITY): Payer: Medicare Other

## 2017-09-28 ENCOUNTER — Encounter (HOSPITAL_COMMUNITY): Payer: Medicare Other

## 2017-09-30 ENCOUNTER — Encounter (HOSPITAL_COMMUNITY): Payer: Self-pay

## 2017-09-30 ENCOUNTER — Ambulatory Visit (HOSPITAL_COMMUNITY)
Admission: RE | Admit: 2017-09-30 | Discharge: 2017-09-30 | Disposition: A | Discharge status: Home / Self Care | Payer: Medicare Other | Source: Ambulatory Visit | Attending: Internal Medicine | Admitting: Internal Medicine

## 2017-09-30 VITALS — BP 106/60 | HR 80

## 2017-09-30 DIAGNOSIS — Z794 Long term (current) use of insulin: Secondary | ICD-10-CM | POA: Diagnosis not present

## 2017-09-30 DIAGNOSIS — Z8249 Family history of ischemic heart disease and other diseases of the circulatory system: Secondary | ICD-10-CM | POA: Diagnosis not present

## 2017-09-30 DIAGNOSIS — I252 Old myocardial infarction: Secondary | ICD-10-CM | POA: Diagnosis not present

## 2017-09-30 DIAGNOSIS — Z951 Presence of aortocoronary bypass graft: Secondary | ICD-10-CM | POA: Diagnosis not present

## 2017-09-30 DIAGNOSIS — Z95810 Presence of automatic (implantable) cardiac defibrillator: Secondary | ICD-10-CM | POA: Insufficient documentation

## 2017-09-30 DIAGNOSIS — N183 Chronic kidney disease, stage 3 unspecified: Secondary | ICD-10-CM

## 2017-09-30 DIAGNOSIS — Z8711 Personal history of peptic ulcer disease: Secondary | ICD-10-CM | POA: Diagnosis not present

## 2017-09-30 DIAGNOSIS — I4891 Unspecified atrial fibrillation: Secondary | ICD-10-CM | POA: Insufficient documentation

## 2017-09-30 DIAGNOSIS — Z7901 Long term (current) use of anticoagulants: Secondary | ICD-10-CM | POA: Insufficient documentation

## 2017-09-30 DIAGNOSIS — I2510 Atherosclerotic heart disease of native coronary artery without angina pectoris: Secondary | ICD-10-CM | POA: Insufficient documentation

## 2017-09-30 DIAGNOSIS — M1710 Unilateral primary osteoarthritis, unspecified knee: Secondary | ICD-10-CM | POA: Diagnosis not present

## 2017-09-30 DIAGNOSIS — I480 Paroxysmal atrial fibrillation: Secondary | ICD-10-CM

## 2017-09-30 DIAGNOSIS — I255 Ischemic cardiomyopathy: Secondary | ICD-10-CM | POA: Insufficient documentation

## 2017-09-30 DIAGNOSIS — I130 Hypertensive heart and chronic kidney disease with heart failure and stage 1 through stage 4 chronic kidney disease, or unspecified chronic kidney disease: Secondary | ICD-10-CM | POA: Insufficient documentation

## 2017-09-30 DIAGNOSIS — E039 Hypothyroidism, unspecified: Secondary | ICD-10-CM | POA: Diagnosis not present

## 2017-09-30 DIAGNOSIS — I5042 Chronic combined systolic (congestive) and diastolic (congestive) heart failure: Secondary | ICD-10-CM | POA: Insufficient documentation

## 2017-09-30 DIAGNOSIS — M069 Rheumatoid arthritis, unspecified: Secondary | ICD-10-CM | POA: Diagnosis not present

## 2017-09-30 DIAGNOSIS — I5022 Chronic systolic (congestive) heart failure: Secondary | ICD-10-CM | POA: Diagnosis not present

## 2017-09-30 DIAGNOSIS — E785 Hyperlipidemia, unspecified: Secondary | ICD-10-CM | POA: Diagnosis not present

## 2017-09-30 DIAGNOSIS — E1122 Type 2 diabetes mellitus with diabetic chronic kidney disease: Secondary | ICD-10-CM | POA: Diagnosis not present

## 2017-09-30 DIAGNOSIS — Z888 Allergy status to other drugs, medicaments and biological substances status: Secondary | ICD-10-CM | POA: Insufficient documentation

## 2017-09-30 DIAGNOSIS — Z79899 Other long term (current) drug therapy: Secondary | ICD-10-CM | POA: Insufficient documentation

## 2017-09-30 LAB — BASIC METABOLIC PANEL
Anion gap: 16 — ABNORMAL HIGH (ref 5–15)
BUN: 46 mg/dL — ABNORMAL HIGH (ref 6–20)
CHLORIDE: 84 mmol/L — AB (ref 101–111)
CO2: 28 mmol/L (ref 22–32)
Calcium: 9.4 mg/dL (ref 8.9–10.3)
Creatinine, Ser: 2.68 mg/dL — ABNORMAL HIGH (ref 0.61–1.24)
GFR calc non Af Amer: 21 mL/min — ABNORMAL LOW (ref >60)
GFR, EST AFRICAN AMERICAN: 24 mL/min — AB (ref >60)
GLUCOSE: 326 mg/dL — AB (ref 65–99)
POTASSIUM: 3.7 mmol/L (ref 3.5–5.1)
Sodium: 128 mmol/L — ABNORMAL LOW (ref 135–145)

## 2017-09-30 NOTE — Progress Notes (Signed)
Advanced Heart Failure Clinic Note   Primary Care: Ann Held MD Primary Cardiologist: Dr. Stanford Breed HF: Dr. Aundra Dubin   HPI:  [name] is a 83 y.o. male with CAD s/p CABG, hypertension, hyperlipidemia and chronic systolic and diastolic heart failure who returns for followup of dyspnea and CHF. .  Admitted in 1/18 with acute on chornic systolic HF complicated by cardiogenic shock.  Required support with both milrinone and norepinephrine. Developed transaminitis 2/2 shock liver.  He also developed atrial fibrillation with RVR requiring amiodarone to control.  Tolvaptan given for hyponatremia.  L/RHC as below. Echo 08/26/16 LVEF 10-15%, Severe LAE, RV reduced, Severe RAE, Trivial pericardial perfusion. He was diuresed and titrated off milrinone/norepinephrine.    He was admitted in 8/18 with acute on chronic systolic CHF.  Echo in 8/18 showed EF 35-40%, mildly decreased RV systolic function.   He was admitted in 12/18 with acute on chronic systolic CHF.  Echo in 12/18 showed EF 20-25%, mild LVH, PASP 37 mmHg.  This admission may have been related to high sodium intake with his diet.   Admitted 09/09/2017 with volume overload. Diuresed with IV lasix and transitioned to torsemide 100 mg twice a day + metolazone weekly. Discharge weight was 193 pounds.   Today he returns for HF follow up with his son. Overall feeling fine. Denies PND/Orthopnea. Says he has lost weight but could not recall the weight. Appetite ok. No fever or chills. pounds. Taking all medications. Plan to discharge from Rehab tomorrow.   Labs 1/18: Hgb 11.8 => 10.2, digoxin 1.7 (dig decreased), Na 132, K 4.5, Creatinine 1.32 => 1.7 Labs 2/18: hgb 11.4, digoxin 1.2, TSH 11 but free T4 and free T3 normal, LFTs normal, K 4.4, creatinine 1.69 Labs 9/18: digoxin 0.6, free T4 0.78, TSH 49 Labs 12/18: K 3.7, creatinine 2.08, LFTs normal, digoxin 0.5 Labs 08/11/17: K 4.8 Creatinine 2.33  Labs 09/13/2017: K 3.4 Creatinine 1.89   Echo  (8/18): EF 35-40%, dyskinesis of the mid-apical anteroseptum, mildly decreased RV systolic function.  Echo (12/18): EF 20-25%, mild LVH, PASP 37 mmHg  RHC/LHC (09/01/16) Coronary Findings  Dominance: Right Left Main: 50% ostial stenosis LAD: Totally occluded proximal LAD after D1. Medium-sized D1 patent with 40-50% stenosis proximally. LIMA-distal LAD patent. Proximal to the LIMA touchdown there are serial 80% stenoses in the mid LAD. Left Cx: LCx occluded proximally. OMs occluded proximally. Sequential SVG-OM1 and OM2 patent with good flow in target vessels. RCA: RCA occluded at the ostium. SVG-PDA patent, backfills only to distal RCA.  Right Heart  RHC Procedural Findings: Hemodynamics (mmHg) RA mean 5 RV 40/8 PA 40/18 mean 27 PCWP mean 19 LV 88/24 AO 86/52 Oxygen saturations: PA 69% AO 98% Cardiac Output (Fick) 6.43  Cardiac Index (Fick) 3.12 Cardiac Output (Thermo) 3.86 Cardiac Index (Thermo) 1.9    Past Medical History:  Diagnosis Date  . AICD (automatic cardioverter/defibrillator) present    DOI 2008;   . Arthritis    "mostly in my knees" (07/20/2017)  . Congestive heart failure, unspecified   . Coronary atherosclerosis of unspecified type of vessel, native or graft   . Hypothyroidism   . Ischemic cardiomyopathy   . Myocardial infarction [facility]) ?2005  . Other and unspecified hyperlipidemia   . Peptic ulcer, unspecified site, unspecified as acute or chronic, without mention of hemorrhage, perforation, or obstruction   . RA (rheumatoid arthritis) ([place])   . Type II diabetes mellitus ([place])   . Unspecified essential hypertension   . Ventricular tachycardia (  [place])    Rx via ICD 5 /2012    Current Outpatient Medications  Medication Sig Dispense Refill  . acetaminophen (TYLENOL) 325 MG tablet Take 650 mg by mouth every 6 (six) hours as needed.    Marland Kitchen amiodarone (PACERONE) 200 MG tablet Take 1 tablet (200 mg total) by mouth daily. 90 tablet 3  . apixaban (ELIQUIS) 2.5 MG  TABS tablet Take 1 tablet (2.5 mg total) 2 (two) times daily by mouth. 180 tablet 3  . atorvastatin (LIPITOR) 10 MG tablet Take 10 mg by mouth at bedtime.     . carvedilol (COREG) 3.125 MG tablet Take 3.125 mg by mouth 2 (two) times daily with a meal.    . digoxin (LANOXIN) 0.125 MG tablet Take 0.5 tablets (0.0625 mg total) by mouth every other day. 15 tablet 3  . esomeprazole (NEXIUM) 40 MG capsule Take 40 mg by mouth daily.      Marland Kitchen glyBURIDE (DIABETA) 5 MG tablet Take 5 mg by mouth daily with breakfast.    . HUMULIN R 100 UNIT/ML injection USE VIA PUMP AS DIRECTED WITH V-GO MACHINE AS DIRECTED. MAX DD OF 66 UNITS  3  . hydrocortisone 2.5 % cream Apply 1 application topically daily. Apply to external ear canals, only as needed for itching    . Insulin Disposable Pump (V-GO 30) KIT daily.   3  . levothyroxine (SYNTHROID, LEVOTHROID) 75 MCG tablet Take 1 tablet (75 mcg total) by mouth daily before breakfast. 30 tablet 0  . metolazone (ZAROXOLYN) 2.5 MG tablet Take 1 tablet (2.5 mg total) by mouth once a week. Thursdays. 5 tablet 3  . polyethylene glycol (MIRALAX / GLYCOLAX) packet Take 17 g by mouth daily as needed for mild constipation.     . potassium chloride SA (K-DUR,KLOR-CON) 20 MEQ tablet Take 40 mEq by mouth daily.    Marland Kitchen spironolactone (ALDACTONE) 25 MG tablet Take 0.5 tablets (12.5 mg total) by mouth daily. 15 tablet 2  . torsemide (DEMADEX) 100 MG tablet Take 1 tablet (100 mg total) by mouth 2 (two) times daily. 180 tablet 3   No current facility-administered medications for this encounter.     Allergies  Allergen Reactions  . Metformin And Related Other (See Comments)    "Made sugar go wild"   . Rivastigmine Nausea And Vomiting    NO EXELON PATCHES!!      Social History   Socioeconomic History  . Marital status: Widowed    Spouse name: Not on file  . Number of children: Not on file  . Years of education: Not on file  . Highest education level: Not on file  Social Needs    . Financial resource strain: Not on file  . Food insecurity - worry: Not on file  . Food insecurity - inability: Not on file  . Transportation needs - medical: Not on file  . Transportation needs - non-medical: Not on file  Occupational History  . Not on file  Tobacco Use  . Smoking status: Never Smoker  . Smokeless tobacco: Never Used  Substance and Sexual Activity  . Alcohol use: No  . Drug use: No  . Sexual activity: Not on file  Other Topics Concern  . Not on file  Social History Narrative   Married. Has 1 surviving child and 4 surviving grandchildren.       Family History  Problem Relation Age of Onset  . Coronary artery disease Sister   . Coronary artery disease Brother  Vitals:   09/30/17 1204  BP: 106/60  Pulse: 80  SpO2: 100%   Wt Readings from Last 3 Encounters:  09/13/17 193 lb 9 oz (87.8 kg)  08/27/17 204 lb 9.4 oz (92.8 kg)  08/19/17 206 lb (93.4 kg)  Refused to weigh today  PHYSICAL EXAM General:  Elderly. No resp difficulty. Arrived in a motorized scooter.  HEENT: normal Neck: supple. JVP 5-6. Carotids 2+ bilat; no bruits. No lymphadenopathy or thryomegaly appreciated. Cor: PMI nondisplaced. Regular rate & rhythm. No rubs, gallops or murmurs. Lungs: clear Abdomen: soft, nontender, nondistended. No hepatosplenomegaly. No bruits or masses. Good bowel sounds. Extremities: no cyanosis, clubbing, rash, edema Neuro: alert & orientedx3, cranial nerves grossly intact. moves all 4 extremities w/o difficulty. Affect pleasant  ASSESSMENT & PLAN: 1. Chronic systolic CHF:  Ischemic cardiomyopathy.  Echo 08/26/16 with LVEF 10-15%, severe biatrial enlargement, decreased RV systolic function.  Most recent echo in 12/18 with EF 20-25%.  [place] ICD.   -NYHA III. Continue torsemide 100 mg twice a day + metolazone 2. mg weekly.  - Continue coreg 3.125 mg twice a day.   -Continue 12.5 mg spiro daily.    - Continue digoxin 0.0625 daily, recent level ok.    - Wear compression stockings during the day.  - Check BMET today  - He has IVCD (146 msec).  CRT will be consideration though benefit may not be as much as with true LBBB. 2. CKD stage III:  BMET today.   3. CAD: s/p CABG.  Coronary angiography 1/18 with patent graft and no targets for revascularization.   No s/s ischemia     - Continue ASA and statin.   4. Atrial fibrillation:   Sounds regular.  - Continue amiodarone 200 mg daily.  LFTs normal recently.  He is on Synthroid for hypothyroidism.  He will need a regular eye exam.  - Continue Eliquis 2.5 mg BID (age, elevated creatinine).   5. Osteoarthritis: Knee OA significantly limits activity.    Plan to see in 2 weeks after d/c to home. Looks great today I am concerned he will be volume overloaded next visit because he will not have 24 hour care. He will need HH when discharged from SNF.    Greater than 50% of the (total minutes 25*) visit spent in counseling/coordination of care regarding the above.   Darrick Grinder, NP 09/30/17

## 2017-09-30 NOTE — Patient Instructions (Signed)
Routine lab work today. Will notify you of abnormal results, otherwise no news is good news!  Follow up 2 weeks with Amy Clegg NP-C.  ________________________________________________________ [name] Code:   Take all medication as prescribed the day of your appointment. Bring all medications with you to your appointment.  Do the following things EVERYDAY: 1) Weigh yourself in the morning before breakfast. Write it down and keep it in a log. 2) Take your medicines as prescribed 3) Eat low salt foods-Limit salt (sodium) to 2000 mg per day.  4) Stay as active as you can everyday 5) Limit all fluids for the day to less than 2 liters

## 2017-10-03 ENCOUNTER — Telehealth (HOSPITAL_COMMUNITY): Payer: Self-pay | Admitting: Cardiology

## 2017-10-03 NOTE — Telephone Encounter (Signed)
-----   Message from Sherald Hess, NP sent at 10/03/2017 10:57 AM EST ----- Please call and ask his brother to stop metolazone. Repeat BMET 7 days

## 2017-10-03 NOTE — Telephone Encounter (Signed)
Patient aware. Patient voiced understanding. Patient requests to have labs repeated with [facility] Order sent to [facility] [phone]

## 2017-10-05 ENCOUNTER — Encounter (HOSPITAL_COMMUNITY): Payer: Medicare Other

## 2017-10-12 ENCOUNTER — Encounter (HOSPITAL_COMMUNITY): Payer: Medicare Other

## 2017-10-14 ENCOUNTER — Ambulatory Visit (HOSPITAL_COMMUNITY)
Admission: RE | Admit: 2017-10-14 | Discharge: 2017-10-14 | Disposition: A | Discharge status: Home / Self Care | Payer: Medicare Other | Source: Ambulatory Visit | Attending: Internal Medicine | Admitting: Internal Medicine

## 2017-10-14 VITALS — BP 108/54 | HR 72 | Wt 184.0 lb

## 2017-10-14 DIAGNOSIS — I130 Hypertensive heart and chronic kidney disease with heart failure and stage 1 through stage 4 chronic kidney disease, or unspecified chronic kidney disease: Secondary | ICD-10-CM | POA: Diagnosis not present

## 2017-10-14 DIAGNOSIS — Z951 Presence of aortocoronary bypass graft: Secondary | ICD-10-CM | POA: Insufficient documentation

## 2017-10-14 DIAGNOSIS — I5022 Chronic systolic (congestive) heart failure: Secondary | ICD-10-CM | POA: Diagnosis present

## 2017-10-14 DIAGNOSIS — I252 Old myocardial infarction: Secondary | ICD-10-CM | POA: Diagnosis not present

## 2017-10-14 DIAGNOSIS — Z79899 Other long term (current) drug therapy: Secondary | ICD-10-CM | POA: Insufficient documentation

## 2017-10-14 DIAGNOSIS — Z8711 Personal history of peptic ulcer disease: Secondary | ICD-10-CM | POA: Diagnosis not present

## 2017-10-14 DIAGNOSIS — Z95810 Presence of automatic (implantable) cardiac defibrillator: Secondary | ICD-10-CM | POA: Diagnosis not present

## 2017-10-14 DIAGNOSIS — I2510 Atherosclerotic heart disease of native coronary artery without angina pectoris: Secondary | ICD-10-CM | POA: Insufficient documentation

## 2017-10-14 DIAGNOSIS — I5042 Chronic combined systolic (congestive) and diastolic (congestive) heart failure: Secondary | ICD-10-CM | POA: Insufficient documentation

## 2017-10-14 DIAGNOSIS — Z79890 Hormone replacement therapy (postmenopausal): Secondary | ICD-10-CM | POA: Insufficient documentation

## 2017-10-14 DIAGNOSIS — Z7901 Long term (current) use of anticoagulants: Secondary | ICD-10-CM | POA: Diagnosis not present

## 2017-10-14 DIAGNOSIS — N183 Chronic kidney disease, stage 3 unspecified: Secondary | ICD-10-CM

## 2017-10-14 DIAGNOSIS — E1122 Type 2 diabetes mellitus with diabetic chronic kidney disease: Secondary | ICD-10-CM | POA: Diagnosis not present

## 2017-10-14 DIAGNOSIS — E785 Hyperlipidemia, unspecified: Secondary | ICD-10-CM | POA: Diagnosis not present

## 2017-10-14 DIAGNOSIS — I480 Paroxysmal atrial fibrillation: Secondary | ICD-10-CM | POA: Diagnosis not present

## 2017-10-14 DIAGNOSIS — E039 Hypothyroidism, unspecified: Secondary | ICD-10-CM | POA: Diagnosis not present

## 2017-10-14 DIAGNOSIS — M069 Rheumatoid arthritis, unspecified: Secondary | ICD-10-CM | POA: Diagnosis not present

## 2017-10-14 DIAGNOSIS — Z794 Long term (current) use of insulin: Secondary | ICD-10-CM | POA: Diagnosis not present

## 2017-10-14 DIAGNOSIS — I255 Ischemic cardiomyopathy: Secondary | ICD-10-CM | POA: Diagnosis not present

## 2017-10-14 DIAGNOSIS — M1710 Unilateral primary osteoarthritis, unspecified knee: Secondary | ICD-10-CM | POA: Insufficient documentation

## 2017-10-14 LAB — BASIC METABOLIC PANEL
Anion gap: 12 (ref 5–15)
BUN: 37 mg/dL — AB (ref 6–20)
CHLORIDE: 87 mmol/L — AB (ref 101–111)
CO2: 26 mmol/L (ref 22–32)
Calcium: 8.8 mg/dL — ABNORMAL LOW (ref 8.9–10.3)
Creatinine, Ser: 1.52 mg/dL — ABNORMAL HIGH (ref 0.61–1.24)
GFR calc Af Amer: 48 mL/min — ABNORMAL LOW (ref >60)
GFR calc non Af Amer: 42 mL/min — ABNORMAL LOW (ref >60)
GLUCOSE: 276 mg/dL — AB (ref 65–99)
POTASSIUM: 4 mmol/L (ref 3.5–5.1)
Sodium: 125 mmol/L — ABNORMAL LOW (ref 135–145)

## 2017-10-14 MED ORDER — LEVOTHYROXINE SODIUM 75 MCG PO TABS: 75.0000 ug | ORAL_TABLET | Freq: Every day | ORAL | 3 refills | Status: AC

## 2017-10-14 MED ORDER — CARVEDILOL 3.125 MG PO TABS: 3.1250 mg | ORAL_TABLET | Freq: Two times a day (BID) | ORAL | 3 refills | Status: DC

## 2017-10-14 NOTE — Progress Notes (Signed)
Advanced Heart Failure Clinic Note   Primary Care: Ann Held MD Primary Cardiologist: Dr. Stanford Breed HF: Dr. Aundra Dubin   HPI: [name] is a 83 y.o. male with CAD s/p CABG, hypertension, hyperlipidemia and chronic systolic and diastolic heart failure who returns for followup of dyspnea and CHF. .  Admitted in 1/18 with acute on chornic systolic HF complicated by cardiogenic shock.  Required support with both milrinone and norepinephrine. Developed transaminitis 2/2 shock liver.  He also developed atrial fibrillation with RVR requiring amiodarone to control.  Tolvaptan given for hyponatremia.  L/RHC as below. Echo 08/26/16 LVEF 10-15%, Severe LAE, RV reduced, Severe RAE, Trivial pericardial perfusion. He was diuresed and titrated off milrinone/norepinephrine.    He was admitted in 8/18 with acute on chronic systolic CHF.  Echo in 8/18 showed EF 35-40%, mildly decreased RV systolic function.   He was admitted in 12/18 with acute on chronic systolic CHF.  Echo in 12/18 showed EF 20-25%, mild LVH, PASP 37 mmHg.  This admission may have been related to high sodium intake with his diet.   Admitted 09/09/2017 with volume overload. Diuresed with IV lasix and transitioned to torsemide 100 mg twice a day + metolazone weekly. Discharge weight was 193 pounds.   Today he returns for HF follow up. He has been discharged from [facility]. Overall feeling fine. Denies SOB/PND/Orthopnea. Appetite ok. No falls at home. Following low salt diet. He has 24 hour care at home. No fever or chills. He has not been weighing at home due to knee pain. Taking all medications. His son prepares all medications. Followed by [facility].   Labs 1/18: Hgb 11.8 => 10.2, digoxin 1.7 (dig decreased), Na 132, K 4.5, Creatinine 1.32 => 1.7 Labs 2/18: hgb 11.4, digoxin 1.2, TSH 11 but free T4 and free T3 normal, LFTs normal, K 4.4, creatinine 1.69 Labs 9/18: digoxin 0.6, free T4 0.78, TSH 49 Labs 12/18: K 3.7, creatinine 2.08,  LFTs normal, digoxin 0.5 Labs 08/11/17: K 4.8 Creatinine 2.33  Labs 09/13/2017: K 3.4 Creatinine 1.89   Echo (8/18): EF 35-40%, dyskinesis of the mid-apical anteroseptum, mildly decreased RV systolic function.  Echo (12/18): EF 20-25%, mild LVH, PASP 37 mmHg  RHC/LHC (09/01/16) Coronary Findings  Dominance: Right Left Main: 50% ostial stenosis LAD: Totally occluded proximal LAD after D1. Medium-sized D1 patent with 40-50% stenosis proximally. LIMA-distal LAD patent. Proximal to the LIMA touchdown there are serial 80% stenoses in the mid LAD. Left Cx: LCx occluded proximally. OMs occluded proximally. Sequential SVG-OM1 and OM2 patent with good flow in target vessels. RCA: RCA occluded at the ostium. SVG-PDA patent, backfills only to distal RCA.  Right Heart  RHC Procedural Findings: Hemodynamics (mmHg) RA mean 5 RV 40/8 PA 40/18 mean 27 PCWP mean 19 LV 88/24 AO 86/52 Oxygen saturations: PA 69% AO 98% Cardiac Output (Fick) 6.43  Cardiac Index (Fick) 3.12 Cardiac Output (Thermo) 3.86 Cardiac Index (Thermo) 1.9    Past Medical History:  Diagnosis Date  . AICD (automatic cardioverter/defibrillator) present    DOI 2008;   . Arthritis    "mostly in my knees" (07/20/2017)  . Congestive heart failure, unspecified   . Coronary atherosclerosis of unspecified type of vessel, native or graft   . Hypothyroidism   . Ischemic cardiomyopathy   . Myocardial infarction [facility]) ?2005  . Other and unspecified hyperlipidemia   . Peptic ulcer, unspecified site, unspecified as acute or chronic, without mention of hemorrhage, perforation, or obstruction   . RA (rheumatoid arthritis) (  [place])   . Type II diabetes mellitus ([place])   . Unspecified essential hypertension   . Ventricular tachycardia ([place])    Rx via ICD 5 /2012    Current Outpatient Medications  Medication Sig Dispense Refill  . acetaminophen (TYLENOL) 325 MG tablet Take 650 mg by mouth every 6 (six) hours as needed.    Marland Kitchen amiodarone  (PACERONE) 200 MG tablet Take 1 tablet (200 mg total) by mouth daily. 90 tablet 3  . apixaban (ELIQUIS) 2.5 MG TABS tablet Take 1 tablet (2.5 mg total) 2 (two) times daily by mouth. 180 tablet 3  . atorvastatin (LIPITOR) 10 MG tablet Take 10 mg by mouth at bedtime.     . carvedilol (COREG) 3.125 MG tablet Take 3.125 mg by mouth 2 (two) times daily with a meal.    . digoxin (LANOXIN) 0.125 MG tablet Take 0.5 tablets (0.0625 mg total) by mouth every other day. 15 tablet 3  . esomeprazole (NEXIUM) 40 MG capsule Take 40 mg by mouth daily.      Marland Kitchen glyBURIDE (DIABETA) 5 MG tablet Take 5 mg by mouth daily with breakfast.    . HUMULIN R 100 UNIT/ML injection USE VIA PUMP AS DIRECTED WITH V-GO MACHINE AS DIRECTED. MAX DD OF 66 UNITS  3  . hydrocortisone 2.5 % cream Apply 1 application topically daily. Apply to external ear canals, only as needed for itching    . Insulin Disposable Pump (V-GO 30) KIT daily.   3  . levothyroxine (SYNTHROID, LEVOTHROID) 75 MCG tablet Take 1 tablet (75 mcg total) by mouth daily before breakfast. 30 tablet 0  . polyethylene glycol (MIRALAX / GLYCOLAX) packet Take 17 g by mouth daily as needed for mild constipation.     . potassium chloride SA (K-DUR,KLOR-CON) 20 MEQ tablet Take 40 mEq by mouth daily.    Marland Kitchen spironolactone (ALDACTONE) 25 MG tablet Take 0.5 tablets (12.5 mg total) by mouth daily. 15 tablet 2  . torsemide (DEMADEX) 100 MG tablet Take 1 tablet (100 mg total) by mouth 2 (two) times daily. 180 tablet 3   No current facility-administered medications for this encounter.     Allergies  Allergen Reactions  . Metformin And Related Other (See Comments)    "Made sugar go wild"   . Rivastigmine Nausea And Vomiting    NO EXELON PATCHES!!      Social History   Socioeconomic History  . Marital status: Widowed    Spouse name: Not on file  . Number of children: Not on file  . Years of education: Not on file  . Highest education level: Not on file  Social Needs  .  Financial resource strain: Not on file  . Food insecurity - worry: Not on file  . Food insecurity - inability: Not on file  . Transportation needs - medical: Not on file  . Transportation needs - non-medical: Not on file  Occupational History  . Not on file  Tobacco Use  . Smoking status: Never Smoker  . Smokeless tobacco: Never Used  Substance and Sexual Activity  . Alcohol use: No  . Drug use: No  . Sexual activity: Not on file  Other Topics Concern  . Not on file  Social History Narrative   Married. Has 1 surviving child and 4 surviving grandchildren.       Family History  Problem Relation Age of Onset  . Coronary artery disease Sister   . Coronary artery disease Brother     Vitals:  10/14/17 1126  BP: (!) 108/54  Pulse: 72  SpO2: 100%  Weight: 184 lb (83.5 kg)   Wt Readings from Last 3 Encounters:  10/14/17 184 lb (83.5 kg)  09/13/17 193 lb 9 oz (87.8 kg)  08/27/17 204 lb 9.4 oz (92.8 kg)    PHYSICAL EXAM General:  Appears chronically ill. Arrived in a motorized scooter. No resp difficulty HEENT: normal Neck: supple. JVP 5-6. Carotids 2+ bilat; no bruits. No lymphadenopathy or thryomegaly appreciated. Cor: PMI nondisplaced. Regular rate & rhythm. No rubs, gallops or murmurs. Lungs: clear Abdomen: soft, nontender, nondistended. No hepatosplenomegaly. No bruits or masses. Good bowel sounds. Extremities: no cyanosis, clubbing, rash, edema Neuro: alert & orientedx3, cranial nerves grossly intact. moves all 4 extremities w/o difficulty. Affect pleasant   ASSESSMENT & PLAN: 1. Chronic systolic CHF:  Ischemic cardiomyopathy.  Echo 08/26/16 with LVEF 10-15%, severe biatrial enlargement, decreased RV systolic function.  Most recent echo in 12/18 with EF 20-25%.  [place] ICD.   -NYHA II-III. Volume status stable. Continue torsemide 100 mg twice a day + metolazone 2. mg weekly. Continue current medications.SBP soft so continue current regimen.  - Continue  coreg 3.125 mg twice a day.   -Continue 12.5 mg spiro daily.    - Continue digoxin 0.0625 daily, recent level ok.   - He has IVCD (146 msec).  CRT will be consideration though benefit may not be as much as with true LBBB. 2. CKD stage III:  BMET today.   3. CAD: s/p CABG.  Coronary angiography 1/18 with patent graft and no targets for revascularization.   No S/S ischemia.  - Continue ASA and statin.   4. PAF:   - Continue amiodarone 200 mg daily.  LFTs normal recently.  He is on Synthroid for hypothyroidism.  He will need a regular eye exam.  - Continue Eliquis 2.5 mg BID (age, elevated creatinine). No bleeding problems.  5. Osteoarthritis: Knee OA significantly limits activity.    BMET today. Possible cardiomems. Greater than 50% of the (total minutes 25  visit spent in counseling/coordination of care regarding possible cardiomems and daily readings.   Follow up in 6 weeks with Dr Aundra Dubin.   Lakendra Helling NP-C  11:27 AM

## 2017-10-14 NOTE — Patient Instructions (Signed)
Routine lab work today. Will notify you of abnormal results, otherwise no news is good news!  Follow up 6 weeks with [name].  _______________________________________________________ [place] Code:  Take all medication as prescribed the day of your appointment. Bring all medications with you to your appointment.  Do the following things EVERYDAY: 1) Weigh yourself in the morning before breakfast. Write it down and keep it in a log. 2) Take your medicines as prescribed 3) Eat low salt foods-Limit salt (sodium) to 2000 mg per day.  4) Stay as active as you can everyday 5) Limit all fluids for the day to less than 2 liters

## 2017-10-17 ENCOUNTER — Telehealth (HOSPITAL_COMMUNITY): Payer: Self-pay

## 2017-10-17 NOTE — Telephone Encounter (Signed)
Result Notes for Basic metabolic panel   Notes recorded by Chyrl Civatte, RN on 10/17/2017 at 3:49 PM EST Order for bmet redraw faxed to [facility] [place] to provided # [phone], patient made aware and agreeable to water restriction. ------  Notes recorded by Tonye Becket D, NP on 10/14/2017 at 1:44 PM EST Sodium . Repeat BMEt next week. Call and restrict free water.

## 2017-10-18 ENCOUNTER — Encounter (HOSPITAL_COMMUNITY): Payer: Medicare Other

## 2017-10-24 ENCOUNTER — Ambulatory Visit (INDEPENDENT_AMBULATORY_CARE_PROVIDER_SITE_OTHER): Payer: Medicare Other | Admitting: *Deleted

## 2017-10-24 DIAGNOSIS — I255 Ischemic cardiomyopathy: Secondary | ICD-10-CM | POA: Diagnosis not present

## 2017-10-24 NOTE — Progress Notes (Signed)
Remote ICD transmission.   

## 2017-10-26 ENCOUNTER — Telehealth: Payer: Self-pay | Admitting: Cardiology

## 2017-10-26 ENCOUNTER — Encounter: Payer: Self-pay | Admitting: Cardiology

## 2017-10-26 NOTE — Telephone Encounter (Signed)
Received incoming records from [facility] for upcoming appointment on 11/11/17 @ 10am with Dr. [name]. Records located in Medical Records. 10/26/17 ab

## 2017-10-31 ENCOUNTER — Telehealth: Payer: Self-pay | Admitting: Cardiology

## 2017-10-31 NOTE — Telephone Encounter (Signed)
spoke to son. No appointment has been made yet. It will be with oral surgeon in [place]-- # [phone].  son states he is 2 broken teeth , but there several other that may need to be pulled as well   son is aware the message will be deferred to Dr [name] and wil be contact once answer is received  Patient has an appointment schedule for 11/11/17 , son does not think patient can wait that long for an answer.

## 2017-10-31 NOTE — Telephone Encounter (Signed)
Pt can hold apixaban 3 days prior to procedure and resume day after; would not use general anesthesia but can have local anesthesia for teeth pull. Olga Millers, MD

## 2017-10-31 NOTE — Telephone Encounter (Signed)
[name] ( Son) is calling because [name] has broken two of his teeth and he wants to know does he need to come off of any medications to have this repaired . Also wants to know if its okay to put him to sleep if need be . Please call   Thanks

## 2017-10-31 NOTE — Telephone Encounter (Signed)
Faxed previous telephone note , giving clearance for dental procedures to DR RABES

## 2017-10-31 NOTE — Telephone Encounter (Signed)
ROUTED TELEPHONE MESSAGE TO ORAL SURGEONS OFFICE

## 2017-10-31 NOTE — Telephone Encounter (Signed)
Spoke to son. Information. Given. Son is aware to call office once appointment is set up. Will fax this information  verbalized understanding.

## 2017-10-31 NOTE — Telephone Encounter (Signed)
NEW MESSAGE    Patient's son call calling to provide fax number to nurse for oral surgeon.  Fax # [phone]

## 2017-11-01 NOTE — Progress Notes (Signed)
HPI: FU coronary disease, status post coronary artery bypassing graft, ischemic cardiomyopathy, hypertension, diabetes, and hyperlipidemia. He also has had a previous defibrillator placed. Patientpreviously admitted1/42fr heart failure/cardiogenic shock. He was noted to have MATand atrial fibrillation and started on amiodarone. Cardiac catheterization showed a 50% left main, occluded LAD, occluded circumflex and occluded right coronary artery. LIMA to LAD patent. Sequential saphenous vein graft to the first and second marginal patent. Saphenous vein graft to the PDA patent. Pulmonary capillary wedge pressure 19. Treated medically including course of milrinone. Course complicated by transaminitis and renal insuff.Echo 12/18 showed EF 20-25, restrictive filling, severe biatrial enlargement. Admitted 1/19 for CHF; cared for by Dr MAundra Dubin Since last seen,  patient denies dyspnea, chest pain, palpitations or syncope.  No pedal edema.  Current Outpatient Medications  Medication Sig Dispense Refill  . acetaminophen (TYLENOL) 325 MG tablet Take 650 mg by mouth every 6 (six) hours as needed for mild pain or moderate pain.     .Marland Kitchenamiodarone (PACERONE) 200 MG tablet Take 1 tablet (200 mg total) by mouth daily. 90 tablet 3  . apixaban (ELIQUIS) 2.5 MG TABS tablet Take 1 tablet (2.5 mg total) 2 (two) times daily by mouth. 180 tablet 3  . atorvastatin (LIPITOR) 10 MG tablet Take 10 mg by mouth at bedtime.     . carvedilol (COREG) 3.125 MG tablet Take 1 tablet (3.125 mg total) by mouth 2 (two) times daily with a meal. 180 tablet 3  . digoxin (LANOXIN) 0.125 MG tablet Take 0.5 tablets (0.0625 mg total) by mouth every other day. 15 tablet 3  . esomeprazole (NEXIUM) 40 MG capsule Take 40 mg by mouth daily.      .Marland KitchenglyBURIDE (DIABETA) 5 MG tablet Take 5 mg by mouth daily with breakfast.    . HUMULIN R 100 UNIT/ML injection USE VIA PUMP AS DIRECTED WITH V-GO MACHINE AS DIRECTED. MAX DD OF 66 UNITS  3  .  hydrocortisone 2.5 % cream Apply 1 application topically daily as needed. Apply to external ear canals, only as needed for itching     . Insulin Disposable Pump (V-GO 30) KIT daily.   3  . levothyroxine (SYNTHROID, LEVOTHROID) 75 MCG tablet Take 1 tablet (75 mcg total) by mouth daily before breakfast. 90 tablet 3  . polyethylene glycol (MIRALAX / GLYCOLAX) packet Take 17 g by mouth daily as needed for mild constipation.     . potassium chloride SA (K-DUR,KLOR-CON) 20 MEQ tablet Take 40 mEq by mouth daily.    .Marland Kitchenspironolactone (ALDACTONE) 25 MG tablet Take 0.5 tablets (12.5 mg total) by mouth daily. 15 tablet 2  . torsemide (DEMADEX) 100 MG tablet Take 1 tablet (100 mg total) by mouth 2 (two) times daily. 180 tablet 3  . vitamin C (ASCORBIC ACID) 500 MG tablet Take 500 mg by mouth 2 (two) times daily.     No current facility-administered medications for this visit.      Past Medical History:  Diagnosis Date  . AICD (automatic cardioverter/defibrillator) present    DOI 2008;   . Arthritis    "mostly in my knees" (07/20/2017)  . Congestive heart failure, unspecified   . Coronary atherosclerosis of unspecified type of vessel, native or graft   . Hypothyroidism   . Ischemic cardiomyopathy   . Myocardial infarction ([facility] ?2005  . Other and unspecified hyperlipidemia   . Peptic ulcer, unspecified site, unspecified as acute or chronic, without mention of hemorrhage, perforation, or obstruction   .  RA (rheumatoid arthritis) (HCC)   . Type II diabetes mellitus (HCC)   . Unspecified essential hypertension   . Ventricular tachycardia (HCC)    Rx via ICD 5 /2012    Past Surgical History:  Procedure Laterality Date  . CARDIAC CATHETERIZATION N/A 09/01/2016   Procedure: Right/Left Heart Cath and Coronary/Graft Angiography;  Surgeon: Dalton S McLean, MD;  Location: [facility] INVASIVE [facility];  Service: Cardiovascular;  Laterality: N/A;  . CARPAL TUNNEL RELEASE Left 05/2008   /notes 12/17/2010  . CARPAL  TUNNEL RELEASE Right 03/2010   /notes 04/08/2010  . CORONARY ARTERY BYPASS GRAFT  2000  . IMPLANTABLE CARDIOVERTER DEFIBRILLATOR (ICD) GENERATOR CHANGE N/A 01/25/2014   Procedure: ICD GENERATOR CHANGE;  Surgeon: Steven C Klein, MD;  Location: [facility] CATH LAB;  Service: Cardiovascular;  Laterality: N/A;  . INGUINAL HERNIA REPAIR Right     Social History   Socioeconomic History  . Marital status: Widowed    Spouse name: Not on file  . Number of children: Not on file  . Years of education: Not on file  . Highest education level: Not on file  Occupational History  . Not on file  Social Needs  . Financial resource strain: Not on file  . Food insecurity:    Worry: Not on file    Inability: Not on file  . Transportation needs:    Medical: Not on file    Non-medical: Not on file  Tobacco Use  . Smoking status: Never Smoker  . Smokeless tobacco: Never Used  Substance and Sexual Activity  . Alcohol use: No  . Drug use: No  . Sexual activity: Not on file  Lifestyle  . Physical activity:    Days per week: Not on file    Minutes per session: Not on file  . Stress: Not on file  Relationships  . Social connections:    Talks on phone: Not on file    Gets together: Not on file    Attends religious service: Not on file    Active member of club or organization: Not on file    Attends meetings of clubs or organizations: Not on file    Relationship status: Not on file  . Intimate partner violence:    Fear of current or ex partner: Not on file    Emotionally abused: Not on file    Physically abused: Not on file    Forced sexual activity: Not on file  Other Topics Concern  . Not on file  Social History Narrative   Married. Has 1 surviving child and 4 surviving grandchildren.     Family History  Problem Relation Age of Onset  . Coronary artery disease Sister   . Coronary artery disease Brother     ROS: no fevers or chills, productive cough, hemoptysis, dysphasia, odynophagia, melena,  hematochezia, dysuria, hematuria, rash, seizure activity, orthopnea, PND, pedal edema, claudication. Remaining systems are negative.  Physical Exam: Well-developed chronically ill appearing in no acute distress.  Skin is warm and dry.  HEENT is normal.  Neck is supple.  Chest is clear to auscultation with normal expansion.  Cardiovascular exam is regular rate and rhythm.  Abdominal exam nontender or distended. No masses palpated. Extremities show no edema. neuro grossly intact   A/P  1 chronic systolic congestive heart failure-patient is euvolemic on examination.  We discussed the importance of low-sodium diet and fluid restriction.  Continue present dose of Demadex.  Check potassium and renal function.  2 ischemic cardiomyopathy-continue carvedilol   and digoxin.  Blood pressure will not allow ARB at present.  3 coronary artery disease-plan to continue statin.  He is not on aspirin given need for anticoagulation.  4 paroxysmal atrial fibrillation-plan to continue amiodarone to maintain sinus rhythm.  Continue apixaban.  Will recheck TSH and free T4.  Patient had liver functions in December that were normal.  Chest x-ray January 2019 showed no evidence of toxicity.  5 prior ICD-monitored by electrophysiology.  6 chronic stage III kidney disease-plan repeat laboratories today.  Kirk Ruths, MD

## 2017-11-01 NOTE — H&P (View-Only) (Signed)
HPI: FU coronary disease, status post coronary artery bypassing graft, ischemic cardiomyopathy, hypertension, diabetes, and hyperlipidemia. He also has had a previous defibrillator placed. Patientpreviously admitted1/42fr heart failure/cardiogenic shock. He was noted to have MATand atrial fibrillation and started on amiodarone. Cardiac catheterization showed a 50% left main, occluded LAD, occluded circumflex and occluded right coronary artery. LIMA to LAD patent. Sequential saphenous vein graft to the first and second marginal patent. Saphenous vein graft to the PDA patent. Pulmonary capillary wedge pressure 19. Treated medically including course of milrinone. Course complicated by transaminitis and renal insuff.Echo 12/18 showed EF 20-25, restrictive filling, severe biatrial enlargement. Admitted 1/19 for CHF; cared for by Dr MAundra Dubin Since last seen,  patient denies dyspnea, chest pain, palpitations or syncope.  No pedal edema.  Current Outpatient Medications  Medication Sig Dispense Refill  . acetaminophen (TYLENOL) 325 MG tablet Take 650 mg by mouth every 6 (six) hours as needed for mild pain or moderate pain.     .Marland Kitchenamiodarone (PACERONE) 200 MG tablet Take 1 tablet (200 mg total) by mouth daily. 90 tablet 3  . apixaban (ELIQUIS) 2.5 MG TABS tablet Take 1 tablet (2.5 mg total) 2 (two) times daily by mouth. 180 tablet 3  . atorvastatin (LIPITOR) 10 MG tablet Take 10 mg by mouth at bedtime.     . carvedilol (COREG) 3.125 MG tablet Take 1 tablet (3.125 mg total) by mouth 2 (two) times daily with a meal. 180 tablet 3  . digoxin (LANOXIN) 0.125 MG tablet Take 0.5 tablets (0.0625 mg total) by mouth every other day. 15 tablet 3  . esomeprazole (NEXIUM) 40 MG capsule Take 40 mg by mouth daily.      .Marland KitchenglyBURIDE (DIABETA) 5 MG tablet Take 5 mg by mouth daily with breakfast.    . HUMULIN R 100 UNIT/ML injection USE VIA PUMP AS DIRECTED WITH V-GO MACHINE AS DIRECTED. MAX DD OF 66 UNITS  3  .  hydrocortisone 2.5 % cream Apply 1 application topically daily as needed. Apply to external ear canals, only as needed for itching     . Insulin Disposable Pump (V-GO 30) KIT daily.   3  . levothyroxine (SYNTHROID, LEVOTHROID) 75 MCG tablet Take 1 tablet (75 mcg total) by mouth daily before breakfast. 90 tablet 3  . polyethylene glycol (MIRALAX / GLYCOLAX) packet Take 17 g by mouth daily as needed for mild constipation.     . potassium chloride SA (K-DUR,KLOR-CON) 20 MEQ tablet Take 40 mEq by mouth daily.    .Marland Kitchenspironolactone (ALDACTONE) 25 MG tablet Take 0.5 tablets (12.5 mg total) by mouth daily. 15 tablet 2  . torsemide (DEMADEX) 100 MG tablet Take 1 tablet (100 mg total) by mouth 2 (two) times daily. 180 tablet 3  . vitamin C (ASCORBIC ACID) 500 MG tablet Take 500 mg by mouth 2 (two) times daily.     No current facility-administered medications for this visit.      Past Medical History:  Diagnosis Date  . AICD (automatic cardioverter/defibrillator) present    DOI 2008;   . Arthritis    "mostly in my knees" (07/20/2017)  . Congestive heart failure, unspecified   . Coronary atherosclerosis of unspecified type of vessel, native or graft   . Hypothyroidism   . Ischemic cardiomyopathy   . Myocardial infarction ([facility] ?2005  . Other and unspecified hyperlipidemia   . Peptic ulcer, unspecified site, unspecified as acute or chronic, without mention of hemorrhage, perforation, or obstruction   .  RA (rheumatoid arthritis) ([place])   . Type II diabetes mellitus ([place])   . Unspecified essential hypertension   . Ventricular tachycardia ([place])    Rx via ICD 5 /2012    Past Surgical History:  Procedure Laterality Date  . CARDIAC CATHETERIZATION N/A 09/01/2016   Procedure: Right/Left Heart Cath and Coronary/Graft Angiography;  Surgeon: Larey Dresser, MD;  Location: [facility];  Service: Cardiovascular;  Laterality: N/A;  . CARPAL TUNNEL RELEASE Left 05/2008   Archie Endo 12/17/2010  . CARPAL  TUNNEL RELEASE Right 03/2010   Archie Endo 04/08/2010  . CORONARY ARTERY BYPASS GRAFT  2000  . IMPLANTABLE CARDIOVERTER DEFIBRILLATOR (ICD) GENERATOR CHANGE N/A 01/25/2014   Procedure: ICD GENERATOR CHANGE;  Surgeon: Deboraha Sprang, MD;  Location: [facility] CATH LAB;  Service: Cardiovascular;  Laterality: N/A;  . INGUINAL HERNIA REPAIR Right     Social History   Socioeconomic History  . Marital status: Widowed    Spouse name: Not on file  . Number of children: Not on file  . Years of education: Not on file  . Highest education level: Not on file  Occupational History  . Not on file  Social Needs  . Financial resource strain: Not on file  . Food insecurity:    Worry: Not on file    Inability: Not on file  . Transportation needs:    Medical: Not on file    Non-medical: Not on file  Tobacco Use  . Smoking status: Never Smoker  . Smokeless tobacco: Never Used  Substance and Sexual Activity  . Alcohol use: No  . Drug use: No  . Sexual activity: Not on file  Lifestyle  . Physical activity:    Days per week: Not on file    Minutes per session: Not on file  . Stress: Not on file  Relationships  . Social connections:    Talks on phone: Not on file    Gets together: Not on file    Attends religious service: Not on file    Active member of club or organization: Not on file    Attends meetings of clubs or organizations: Not on file    Relationship status: Not on file  . Intimate partner violence:    Fear of current or ex partner: Not on file    Emotionally abused: Not on file    Physically abused: Not on file    Forced sexual activity: Not on file  Other Topics Concern  . Not on file  Social History Narrative   Married. Has 1 surviving child and 4 surviving grandchildren.     Family History  Problem Relation Age of Onset  . Coronary artery disease Sister   . Coronary artery disease Brother     ROS: no fevers or chills, productive cough, hemoptysis, dysphasia, odynophagia, melena,  hematochezia, dysuria, hematuria, rash, seizure activity, orthopnea, PND, pedal edema, claudication. Remaining systems are negative.  Physical Exam: Well-developed chronically ill appearing in no acute distress.  Skin is warm and dry.  HEENT is normal.  Neck is supple.  Chest is clear to auscultation with normal expansion.  Cardiovascular exam is regular rate and rhythm.  Abdominal exam nontender or distended. No masses palpated. Extremities show no edema. neuro grossly intact   A/P  1 chronic systolic congestive heart failure-patient is euvolemic on examination.  We discussed the importance of low-sodium diet and fluid restriction.  Continue present dose of Demadex.  Check potassium and renal function.  2 ischemic cardiomyopathy-continue carvedilol  and digoxin.  Blood pressure will not allow ARB at present.  3 coronary artery disease-plan to continue statin.  He is not on aspirin given need for anticoagulation.  4 paroxysmal atrial fibrillation-plan to continue amiodarone to maintain sinus rhythm.  Continue apixaban.  Will recheck TSH and free T4.  Patient had liver functions in December that were normal.  Chest x-ray January 2019 showed no evidence of toxicity.  5 prior ICD-monitored by electrophysiology.  6 chronic stage III kidney disease-plan repeat laboratories today.  Kirk Ruths, MD

## 2017-11-04 ENCOUNTER — Other Ambulatory Visit (HOSPITAL_COMMUNITY): Payer: Self-pay | Admitting: *Deleted

## 2017-11-04 MED ORDER — CARVEDILOL 3.125 MG PO TABS: 3.1250 mg | ORAL_TABLET | Freq: Two times a day (BID) | ORAL | 3 refills | Status: AC

## 2017-11-11 ENCOUNTER — Ambulatory Visit (INDEPENDENT_AMBULATORY_CARE_PROVIDER_SITE_OTHER): Payer: Medicare Other | Admitting: Cardiology

## 2017-11-11 ENCOUNTER — Encounter: Payer: Self-pay | Admitting: Cardiology

## 2017-11-11 VITALS — BP 92/58 | HR 69 | Ht 70.0 in | Wt 187.8 lb

## 2017-11-11 DIAGNOSIS — I5022 Chronic systolic (congestive) heart failure: Secondary | ICD-10-CM

## 2017-11-11 DIAGNOSIS — I255 Ischemic cardiomyopathy: Secondary | ICD-10-CM | POA: Diagnosis not present

## 2017-11-11 DIAGNOSIS — I480 Paroxysmal atrial fibrillation: Secondary | ICD-10-CM | POA: Diagnosis not present

## 2017-11-11 DIAGNOSIS — R7989 Other specified abnormal findings of blood chemistry: Secondary | ICD-10-CM

## 2017-11-11 LAB — TSH: TSH: 8.97 u[IU]/mL — ABNORMAL HIGH (ref 0.450–4.500)

## 2017-11-11 LAB — BASIC METABOLIC PANEL
BUN / CREAT RATIO: 14 (ref 10–24)
BUN: 24 mg/dL (ref 8–27)
CO2: 27 mmol/L (ref 20–29)
CREATININE: 1.73 mg/dL — AB (ref 0.76–1.27)
Calcium: 8.8 mg/dL (ref 8.6–10.2)
Chloride: 90 mmol/L — ABNORMAL LOW (ref 96–106)
GFR calc non Af Amer: 36 mL/min/{1.73_m2} — ABNORMAL LOW (ref >59)
GFR, EST AFRICAN AMERICAN: 42 mL/min/{1.73_m2} — AB (ref >59)
Glucose: 80 mg/dL (ref 65–99)
Potassium: 4.2 mmol/L (ref 3.5–5.2)
Sodium: 131 mmol/L — ABNORMAL LOW (ref 134–144)

## 2017-11-11 LAB — T4, FREE: Free T4: 1.4 ng/dL (ref 0.82–1.77)

## 2017-11-11 NOTE — Patient Instructions (Signed)
Medication Instructions:   NO CHANGE  Labwork:  Your physician recommends that you HAVE LAB WORK TODAY  Follow-Up:  Your physician recommends that you schedule a follow-up appointment in: 4 MONTHS WITH DR [name]  If you need a refill on your cardiac medications before your next appointment, please call your pharmacy.

## 2017-11-12 LAB — CUP PACEART REMOTE DEVICE CHECK
Battery Remaining Percentage: 100 %
Brady Statistic RV Percent Paced: 0 %
HighPow Impedance: 46 Ohm
Implantable Lead Implant Date: 20080415
Implantable Lead Serial Number: 176041
Implantable Pulse Generator Implant Date: 20150612
Lead Channel Impedance Value: 439 Ohm
Lead Channel Pacing Threshold Pulse Width: 0.4 ms
Lead Channel Setting Pacing Amplitude: 2.4 V
Lead Channel Setting Pacing Pulse Width: 0.4 ms
Lead Channel Setting Sensing Sensitivity: 0.5 mV
MDC IDC LEAD LOCATION: 753860
MDC IDC MSMT BATTERY REMAINING LONGEVITY: 120 mo
MDC IDC MSMT LEADCHNL RV PACING THRESHOLD AMPLITUDE: 0.9 V
MDC IDC SESS DTM: 20190311053100
Pulse Gen Serial Number: 126688

## 2017-11-16 ENCOUNTER — Telehealth (HOSPITAL_COMMUNITY): Payer: Self-pay | Admitting: *Deleted

## 2017-11-16 ENCOUNTER — Other Ambulatory Visit (HOSPITAL_COMMUNITY): Payer: Self-pay | Admitting: *Deleted

## 2017-11-16 DIAGNOSIS — I5022 Chronic systolic (congestive) heart failure: Secondary | ICD-10-CM

## 2017-11-16 NOTE — Telephone Encounter (Signed)
Pt's Cardiomems finally approved through [facility], [place] # [id number].  Pt sch for implant 11/18/17, pt's son aware and all instructions have been reviewed with him via phone

## 2017-11-18 ENCOUNTER — Ambulatory Visit (HOSPITAL_COMMUNITY)
Admission: RE | Admit: 2017-11-18 | Discharge: 2017-11-18 | Disposition: A | Discharge status: Home / Self Care | Payer: Medicare Other | Source: Ambulatory Visit | Attending: Cardiology | Admitting: Cardiology

## 2017-11-18 ENCOUNTER — Ambulatory Visit (HOSPITAL_COMMUNITY)
Admission: RE | Disposition: A | Discharge status: Home / Self Care | Payer: Self-pay | Source: Ambulatory Visit | Attending: Cardiology

## 2017-11-18 DIAGNOSIS — E039 Hypothyroidism, unspecified: Secondary | ICD-10-CM | POA: Insufficient documentation

## 2017-11-18 DIAGNOSIS — N183 Chronic kidney disease, stage 3 (moderate): Secondary | ICD-10-CM | POA: Insufficient documentation

## 2017-11-18 DIAGNOSIS — I130 Hypertensive heart and chronic kidney disease with heart failure and stage 1 through stage 4 chronic kidney disease, or unspecified chronic kidney disease: Secondary | ICD-10-CM | POA: Diagnosis not present

## 2017-11-18 DIAGNOSIS — Z95810 Presence of automatic (implantable) cardiac defibrillator: Secondary | ICD-10-CM | POA: Diagnosis not present

## 2017-11-18 DIAGNOSIS — I5022 Chronic systolic (congestive) heart failure: Secondary | ICD-10-CM | POA: Diagnosis not present

## 2017-11-18 DIAGNOSIS — Z794 Long term (current) use of insulin: Secondary | ICD-10-CM | POA: Insufficient documentation

## 2017-11-18 DIAGNOSIS — E785 Hyperlipidemia, unspecified: Secondary | ICD-10-CM | POA: Diagnosis not present

## 2017-11-18 DIAGNOSIS — Z79890 Hormone replacement therapy (postmenopausal): Secondary | ICD-10-CM | POA: Insufficient documentation

## 2017-11-18 DIAGNOSIS — I2510 Atherosclerotic heart disease of native coronary artery without angina pectoris: Secondary | ICD-10-CM | POA: Diagnosis not present

## 2017-11-18 DIAGNOSIS — I272 Pulmonary hypertension, unspecified: Secondary | ICD-10-CM | POA: Insufficient documentation

## 2017-11-18 DIAGNOSIS — I255 Ischemic cardiomyopathy: Secondary | ICD-10-CM | POA: Insufficient documentation

## 2017-11-18 DIAGNOSIS — I480 Paroxysmal atrial fibrillation: Secondary | ICD-10-CM | POA: Insufficient documentation

## 2017-11-18 DIAGNOSIS — I252 Old myocardial infarction: Secondary | ICD-10-CM | POA: Diagnosis not present

## 2017-11-18 DIAGNOSIS — M069 Rheumatoid arthritis, unspecified: Secondary | ICD-10-CM | POA: Insufficient documentation

## 2017-11-18 DIAGNOSIS — Z79899 Other long term (current) drug therapy: Secondary | ICD-10-CM | POA: Insufficient documentation

## 2017-11-18 DIAGNOSIS — E1122 Type 2 diabetes mellitus with diabetic chronic kidney disease: Secondary | ICD-10-CM | POA: Insufficient documentation

## 2017-11-18 DIAGNOSIS — Z7901 Long term (current) use of anticoagulants: Secondary | ICD-10-CM | POA: Diagnosis not present

## 2017-11-18 DIAGNOSIS — I509 Heart failure, unspecified: Secondary | ICD-10-CM | POA: Diagnosis not present

## 2017-11-18 LAB — POCT I-STAT 3, VENOUS BLOOD GAS (G3P V)
Bicarbonate: 24.8 mmol/L (ref 20.0–28.0)
Bicarbonate: 25.6 mmol/L (ref 20.0–28.0)
O2 SAT: 49 %
O2 Saturation: 46 %
PCO2 VEN: 42.9 mmHg — AB (ref 44.0–60.0)
PH VEN: 7.384 (ref 7.250–7.430)
TCO2: 26 mmol/L (ref 22–32)
TCO2: 27 mmol/L (ref 22–32)
pCO2, Ven: 42.1 mmHg — ABNORMAL LOW (ref 44.0–60.0)
pH, Ven: 7.378 (ref 7.250–7.430)
pO2, Ven: 26 mmHg — CL (ref 32.0–45.0)
pO2, Ven: 27 mmHg — CL (ref 32.0–45.0)

## 2017-11-18 LAB — CBC
HEMATOCRIT: 28.6 % — AB (ref 39.0–52.0)
HEMOGLOBIN: 9.3 g/dL — AB (ref 13.0–17.0)
MCH: 26.1 pg (ref 26.0–34.0)
MCHC: 32.5 g/dL (ref 30.0–36.0)
MCV: 80.1 fL (ref 78.0–100.0)
PLATELETS: 210 10*3/uL (ref 150–400)
RBC: 3.57 MIL/uL — AB (ref 4.22–5.81)
RDW: 18.5 % — ABNORMAL HIGH (ref 11.5–15.5)
WBC: 7.9 10*3/uL (ref 4.0–10.5)

## 2017-11-18 LAB — BASIC METABOLIC PANEL
ANION GAP: 11 (ref 5–15)
BUN: 37 mg/dL — ABNORMAL HIGH (ref 6–20)
CHLORIDE: 91 mmol/L — AB (ref 101–111)
CO2: 24 mmol/L (ref 22–32)
CREATININE: 2.05 mg/dL — AB (ref 0.61–1.24)
Calcium: 8.7 mg/dL — ABNORMAL LOW (ref 8.9–10.3)
GFR calc non Af Amer: 29 mL/min — ABNORMAL LOW (ref >60)
GFR, EST AFRICAN AMERICAN: 34 mL/min — AB (ref >60)
Glucose, Bld: 141 mg/dL — ABNORMAL HIGH (ref 65–99)
POTASSIUM: 4.7 mmol/L (ref 3.5–5.1)
SODIUM: 126 mmol/L — AB (ref 135–145)

## 2017-11-18 LAB — GLUCOSE, CAPILLARY
GLUCOSE-CAPILLARY: 113 mg/dL — AB (ref 65–99)
GLUCOSE-CAPILLARY: 81 mg/dL (ref 65–99)
Glucose-Capillary: 137 mg/dL — ABNORMAL HIGH (ref 65–99)

## 2017-11-18 SURGERY — PRESSURE SENSOR/CARDIOMEMS
Anesthesia: LOCAL

## 2017-11-18 MED ORDER — LIDOCAINE HCL (PF) 1 % IJ SOLN
INTRAMUSCULAR | Status: DC | PRN
  Administered 2017-11-18: 20 mL

## 2017-11-18 MED ORDER — NITROGLYCERIN 1 MG/10 ML FOR IR/CATH LAB
INTRA_ARTERIAL | Status: AC
  Filled 2017-11-18: qty 10

## 2017-11-18 MED ORDER — HEPARIN (PORCINE) IN NACL 2-0.9 UNIT/ML-% IJ SOLN
INTRAMUSCULAR | Status: AC
  Filled 2017-11-18: qty 1000

## 2017-11-18 MED ORDER — SODIUM CHLORIDE 0.9% FLUSH: 3.0000 mL | Freq: Two times a day (BID) | INTRAVENOUS | Status: DC

## 2017-11-18 MED ORDER — ASPIRIN 81 MG PO CHEW
CHEWABLE_TABLET | ORAL | Status: AC
  Filled 2017-11-18: qty 1

## 2017-11-18 MED ORDER — MIDAZOLAM HCL 2 MG/2ML IJ SOLN
INTRAMUSCULAR | Status: AC
  Filled 2017-11-18: qty 2

## 2017-11-18 MED ORDER — SODIUM CHLORIDE 0.9% FLUSH: 3.0000 mL | INTRAVENOUS | Status: DC | PRN

## 2017-11-18 MED ORDER — SODIUM CHLORIDE 0.9 % IV SOLN
INTRAVENOUS | Status: DC
  Administered 2017-11-18: 08:00:00 via INTRAVENOUS

## 2017-11-18 MED ORDER — SODIUM CHLORIDE 0.9 % IV SOLN: 250.0000 mL | INTRAVENOUS | Status: DC | PRN

## 2017-11-18 MED ORDER — FENTANYL CITRATE (PF) 100 MCG/2ML IJ SOLN
INTRAMUSCULAR | Status: DC | PRN
  Administered 2017-11-18: 25 ug via INTRAVENOUS

## 2017-11-18 MED ORDER — MIDAZOLAM HCL 2 MG/2ML IJ SOLN
INTRAMUSCULAR | Status: DC | PRN
  Administered 2017-11-18: 1 mg via INTRAVENOUS

## 2017-11-18 MED ORDER — ASPIRIN 81 MG PO CHEW
81.0000 mg | CHEWABLE_TABLET | ORAL | Status: AC
  Administered 2017-11-18: 81 mg via ORAL

## 2017-11-18 MED ORDER — HEPARIN (PORCINE) IN NACL 2-0.9 UNIT/ML-% IJ SOLN
INTRAMUSCULAR | Status: AC | PRN
  Administered 2017-11-18 (×2): 500 mL

## 2017-11-18 MED ORDER — FENTANYL CITRATE (PF) 100 MCG/2ML IJ SOLN
INTRAMUSCULAR | Status: AC
  Filled 2017-11-18: qty 2

## 2017-11-18 SURGICAL SUPPLY — 12 items
CARDIOMEMS PA SENSOR W/DELIVER (Prosthesis & Implant Heart) ×2 IMPLANT
CATH SWAN GANZ 7F STRAIGHT (CATHETERS) ×1 IMPLANT
KIT HEART LEFT (KITS) ×1 IMPLANT
KIT HEART RIGHT NAMIC (KITS) ×1 IMPLANT
PACK CARDIAC CATHETERIZATION (CUSTOM PROCEDURE TRAY) ×1 IMPLANT
SENSOR CARDIOMEMS PA W/DELIVER (Prosthesis & Implant Heart) IMPLANT
SHEATH AVANTI 11CM 5FR (SHEATH) IMPLANT
SHEATH AVANTI 11CM 7FR (SHEATH) ×1 IMPLANT
SHEATH FAST CATH 12F 12CM (SHEATH) ×1 IMPLANT
TRANSDUCER W/STOPCOCK (MISCELLANEOUS) ×1 IMPLANT
WIRE EMERALD 3MM-J .025X260CM (WIRE) ×1 IMPLANT
WIRE HI TORQ STEELCORE18 300CM (WIRE) ×1 IMPLANT

## 2017-11-18 NOTE — Progress Notes (Addendum)
Site area: Right groin a 12 french venous sheaths were removed  Site Prior to Removal:  Level 0  Pressure Applied For 30 MINUTES     Bedrest Beginning at 1150am  Manual:   Yes.    Patient Status During Pull:  stable  Post Pull Groin Site:  Level 0  Post Pull Instructions Given:  Yes.    Post Pull Pulses Present:  Yes.    Dressing Applied:  Yes.    Comments:  VS remain stable

## 2017-11-18 NOTE — Progress Notes (Signed)
Pt continues to desat while sleeping.  O2 at 2 lpm applied until awake

## 2017-11-18 NOTE — Interval H&P Note (Signed)
History and Physical Interval Note:  11/18/2017 7:50 AM  [name]  has presented today for surgery, with the diagnosis of hf  The various methods of treatment have been discussed with the patient and family. After consideration of risks, benefits and other options for treatment, the patient has consented to  Procedure(s): PRESSURE SENSOR/CARDIOMEMS (N/A) as a surgical intervention .  The patient's history has been reviewed, patient examined, no change in status, stable for surgery.  I have reviewed the patient's chart and labs.  Questions were answered to the patient's satisfaction.     Bartley Vuolo [employer]

## 2017-11-18 NOTE — Discharge Instructions (Signed)

## 2017-11-21 MED FILL — Nitroglycerin IV Soln 100 MCG/ML in D5W: INTRA_ARTERIAL | Qty: 10 | Status: AC

## 2017-11-21 MED FILL — Heparin Sodium (Porcine) 2 Unit/ML in Sodium Chloride 0.9%: INTRAMUSCULAR | Qty: 1000 | Status: AC

## 2017-11-25 ENCOUNTER — Encounter (HOSPITAL_COMMUNITY): Payer: Self-pay | Admitting: Cardiology

## 2017-11-25 ENCOUNTER — Ambulatory Visit (HOSPITAL_COMMUNITY)
Admission: RE | Admit: 2017-11-25 | Discharge: 2017-11-25 | Disposition: A | Discharge status: Home / Self Care | Payer: Medicare Other | Source: Ambulatory Visit | Attending: Cardiology | Admitting: Cardiology

## 2017-11-25 VITALS — BP 103/63 | HR 63 | Wt 194.0 lb

## 2017-11-25 DIAGNOSIS — Z79899 Other long term (current) drug therapy: Secondary | ICD-10-CM | POA: Diagnosis not present

## 2017-11-25 DIAGNOSIS — M069 Rheumatoid arthritis, unspecified: Secondary | ICD-10-CM | POA: Diagnosis not present

## 2017-11-25 DIAGNOSIS — E785 Hyperlipidemia, unspecified: Secondary | ICD-10-CM | POA: Insufficient documentation

## 2017-11-25 DIAGNOSIS — I130 Hypertensive heart and chronic kidney disease with heart failure and stage 1 through stage 4 chronic kidney disease, or unspecified chronic kidney disease: Secondary | ICD-10-CM | POA: Insufficient documentation

## 2017-11-25 DIAGNOSIS — I252 Old myocardial infarction: Secondary | ICD-10-CM | POA: Diagnosis not present

## 2017-11-25 DIAGNOSIS — Z951 Presence of aortocoronary bypass graft: Secondary | ICD-10-CM | POA: Diagnosis not present

## 2017-11-25 DIAGNOSIS — E1122 Type 2 diabetes mellitus with diabetic chronic kidney disease: Secondary | ICD-10-CM | POA: Insufficient documentation

## 2017-11-25 DIAGNOSIS — Z95810 Presence of automatic (implantable) cardiac defibrillator: Secondary | ICD-10-CM | POA: Insufficient documentation

## 2017-11-25 DIAGNOSIS — I447 Left bundle-branch block, unspecified: Secondary | ICD-10-CM | POA: Insufficient documentation

## 2017-11-25 DIAGNOSIS — I2510 Atherosclerotic heart disease of native coronary artery without angina pectoris: Secondary | ICD-10-CM | POA: Insufficient documentation

## 2017-11-25 DIAGNOSIS — E871 Hypo-osmolality and hyponatremia: Secondary | ICD-10-CM | POA: Diagnosis not present

## 2017-11-25 DIAGNOSIS — E039 Hypothyroidism, unspecified: Secondary | ICD-10-CM | POA: Insufficient documentation

## 2017-11-25 DIAGNOSIS — N183 Chronic kidney disease, stage 3 unspecified: Secondary | ICD-10-CM

## 2017-11-25 DIAGNOSIS — I255 Ischemic cardiomyopathy: Secondary | ICD-10-CM | POA: Insufficient documentation

## 2017-11-25 DIAGNOSIS — I480 Paroxysmal atrial fibrillation: Secondary | ICD-10-CM

## 2017-11-25 DIAGNOSIS — Z794 Long term (current) use of insulin: Secondary | ICD-10-CM | POA: Diagnosis not present

## 2017-11-25 DIAGNOSIS — I5022 Chronic systolic (congestive) heart failure: Secondary | ICD-10-CM | POA: Diagnosis not present

## 2017-11-25 DIAGNOSIS — I5042 Chronic combined systolic (congestive) and diastolic (congestive) heart failure: Secondary | ICD-10-CM | POA: Insufficient documentation

## 2017-11-25 DIAGNOSIS — Z7982 Long term (current) use of aspirin: Secondary | ICD-10-CM | POA: Diagnosis not present

## 2017-11-25 DIAGNOSIS — Z79890 Hormone replacement therapy (postmenopausal): Secondary | ICD-10-CM | POA: Diagnosis not present

## 2017-11-25 DIAGNOSIS — Z7901 Long term (current) use of anticoagulants: Secondary | ICD-10-CM | POA: Diagnosis not present

## 2017-11-25 LAB — COMPREHENSIVE METABOLIC PANEL
ALT: 45 U/L (ref 17–63)
AST: 37 U/L (ref 15–41)
Albumin: 3.4 g/dL — ABNORMAL LOW (ref 3.5–5.0)
Alkaline Phosphatase: 111 U/L (ref 38–126)
Anion gap: 12 (ref 5–15)
BILIRUBIN TOTAL: 1 mg/dL (ref 0.3–1.2)
BUN: 32 mg/dL — AB (ref 6–20)
CHLORIDE: 96 mmol/L — AB (ref 101–111)
CO2: 25 mmol/L (ref 22–32)
Calcium: 8.7 mg/dL — ABNORMAL LOW (ref 8.9–10.3)
Creatinine, Ser: 1.77 mg/dL — ABNORMAL HIGH (ref 0.61–1.24)
GFR, EST AFRICAN AMERICAN: 40 mL/min — AB (ref >60)
GFR, EST NON AFRICAN AMERICAN: 35 mL/min — AB (ref >60)
Glucose, Bld: 56 mg/dL — ABNORMAL LOW (ref 65–99)
POTASSIUM: 3.3 mmol/L — AB (ref 3.5–5.1)
Sodium: 133 mmol/L — ABNORMAL LOW (ref 135–145)
TOTAL PROTEIN: 6.9 g/dL (ref 6.5–8.1)

## 2017-11-25 LAB — CBC
HEMATOCRIT: 29.1 % — AB (ref 39.0–52.0)
Hemoglobin: 9.4 g/dL — ABNORMAL LOW (ref 13.0–17.0)
MCH: 25.8 pg — ABNORMAL LOW (ref 26.0–34.0)
MCHC: 32.3 g/dL (ref 30.0–36.0)
MCV: 79.7 fL (ref 78.0–100.0)
Platelets: 235 10*3/uL (ref 150–400)
RBC: 3.65 MIL/uL — ABNORMAL LOW (ref 4.22–5.81)
RDW: 18.1 % — ABNORMAL HIGH (ref 11.5–15.5)
WBC: 6.3 10*3/uL (ref 4.0–10.5)

## 2017-11-25 LAB — DIGOXIN LEVEL: DIGOXIN LVL: 0.6 ng/mL — AB (ref 0.8–2.0)

## 2017-11-25 MED ORDER — METOLAZONE 2.5 MG PO TABS: 2.5000 mg | ORAL_TABLET | ORAL | 3 refills | Status: DC

## 2017-11-25 NOTE — Patient Instructions (Signed)
Stop Asprin on May 3rd, 2019  Start Metolazone 2.5 mg (1 tab) every Saturday (before Torsemide) with extra Potassium (1 tab) 20 meq  Decrease Fluid 2 Liters (7 cups daily)  Rx for compression stockings given  Labs drawn today (if we do not call you, then your lab work was stable)   Your physician recommends that you return for lab work in: 10 days Rx given  Your physician recommends that you schedule a follow-up appointment in: 1 month with [name]

## 2017-11-27 NOTE — Progress Notes (Signed)
Advanced Heart Failure Clinic Note   Primary Care: Ann Held MD Primary Cardiologist: Dr. Stanford Breed HF: Dr. Aundra Dubin   HPI: [name] is a 83 y.o. male with CAD s/p CABG, hypertension, hyperlipidemia and chronic systolic and diastolic heart failure who returns for followup of dyspnea and CHF. .  Admitted in 1/18 with acute on chornic systolic HF complicated by cardiogenic shock.  Required support with both milrinone and norepinephrine. Developed transaminitis 2/2 shock liver.  He also developed atrial fibrillation with RVR requiring amiodarone to control.  Tolvaptan given for hyponatremia.  L/RHC as below. Echo 08/26/16 LVEF 10-15%, Severe LAE, RV reduced, Severe RAE, Trivial pericardial perfusion. He was diuresed and titrated off milrinone/norepinephrine.    He was admitted in 8/18 with acute on chronic systolic CHF.  Echo in 8/18 showed EF 35-40%, mildly decreased RV systolic function.   He was admitted in 12/18 with acute on chronic systolic CHF.  Echo in 12/18 showed EF 20-25%, mild LVH, PASP 37 mmHg.  This admission may have been related to high sodium intake with his diet.   Admitted 09/09/2017 with volume overload. Diuresed with IV lasix and transitioned to torsemide 100 mg twice a day + metolazone weekly. Discharge weight was 193 pounds.   He had CardioMems implant in 4/19.    Weight is up since last appointment.  He has not been taking metolazone. He does minimal walking due to orthopedic problems.  No dyspnea with transfers.  No orthopnea/PND. No chest pain.  He feels like he is volume overloaded. No BRBPR/melena.    CardioMems goal PADP 18-23, today PADP 18.    ECG (personally reviewed): NSR, LBBB 154 msec  Labs 1/18: Hgb 11.8 => 10.2, digoxin 1.7 (dig decreased), Na 132, K 4.5, Creatinine 1.32 => 1.7 Labs 2/18: hgb 11.4, digoxin 1.2, TSH 11 but free T4 and free T3 normal, LFTs normal, K 4.4, creatinine 1.69 Labs 9/18: digoxin 0.6, free T4 0.78, TSH 49 Labs 12/18: K 3.7,  creatinine 2.08, LFTs normal, digoxin 0.5, LDL 12, HDL 26 Labs 08/11/17: K 4.8 Creatinine 2.33  Labs 09/13/2017: K 3.4 Creatinine 1.89  Labs 4/19: Na 126, K 4.7, creatinine 2.05  Echo (8/18): EF 35-40%, dyskinesis of the mid-apical anteroseptum, mildly decreased RV systolic function.  Echo (12/18): EF 20-25%, mild LVH, PASP 37 mmHg  RHC/LHC (09/01/16) Coronary Findings  Dominance: Right Left Main: 50% ostial stenosis LAD: Totally occluded proximal LAD after D1. Medium-sized D1 patent with 40-50% stenosis proximally. LIMA-distal LAD patent. Proximal to the LIMA touchdown there are serial 80% stenoses in the mid LAD. Left Cx: LCx occluded proximally. OMs occluded proximally. Sequential SVG-OM1 and OM2 patent with good flow in target vessels. RCA: RCA occluded at the ostium. SVG-PDA patent, backfills only to distal RCA.  Right Heart  RHC Procedural Findings: Hemodynamics (mmHg) RA mean 5 RV 40/8 PA 40/18 mean 27 PCWP mean 19 LV 88/24 AO 86/52 Oxygen saturations: PA 69% AO 98% Cardiac Output (Fick) 6.43  Cardiac Index (Fick) 3.12 Cardiac Output (Thermo) 3.86 Cardiac Index (Thermo) 1.9  RHC (4/19): mean RA 14, PA 49/21 mean 32, mean PCWP 18, CI 2.15, PVR 3.2.    Past Medical History:  Diagnosis Date  . AICD (automatic cardioverter/defibrillator) present    DOI 2008;   . Arthritis    "mostly in my knees" (07/20/2017)  . Congestive heart failure, unspecified   . Coronary atherosclerosis of unspecified type of vessel, native or graft   . Hypothyroidism   . Ischemic cardiomyopathy   .  Myocardial infarction [facility]) ?2005  . Other and unspecified hyperlipidemia   . Peptic ulcer, unspecified site, unspecified as acute or chronic, without mention of hemorrhage, perforation, or obstruction   . RA (rheumatoid arthritis) ([place])   . Type II diabetes mellitus ([place])   . Unspecified essential hypertension   . Ventricular tachycardia ([place])    Rx via ICD 5 /2012    Current Outpatient  Medications  Medication Sig Dispense Refill  . acetaminophen (TYLENOL) 325 MG tablet Take 650 mg by mouth every 6 (six) hours as needed for mild pain or moderate pain.     Marland Kitchen amiodarone (PACERONE) 200 MG tablet Take 1 tablet (200 mg total) by mouth daily. 90 tablet 3  . aspirin 81 MG chewable tablet Chew by mouth daily.    Marland Kitchen atorvastatin (LIPITOR) 10 MG tablet Take 10 mg by mouth at bedtime.     . carvedilol (COREG) 3.125 MG tablet Take 1 tablet (3.125 mg total) by mouth 2 (two) times daily with a meal. 180 tablet 3  . digoxin (LANOXIN) 0.125 MG tablet Take 0.5 tablets (0.0625 mg total) by mouth every other day. 15 tablet 3  . esomeprazole (NEXIUM) 40 MG capsule Take 40 mg by mouth daily.      Marland Kitchen glyBURIDE (DIABETA) 5 MG tablet Take 5 mg by mouth daily with breakfast.    . HUMULIN R 100 UNIT/ML injection USE VIA PUMP AS DIRECTED WITH V-GO MACHINE AS DIRECTED. MAX DD OF 66 UNITS  3  . hydrocortisone 2.5 % cream Apply 1 application topically daily as needed. Apply to external ear canals, only as needed for itching     . Insulin Disposable Pump (V-GO 30) KIT daily.   3  . levothyroxine (SYNTHROID, LEVOTHROID) 75 MCG tablet Take 1 tablet (75 mcg total) by mouth daily before breakfast. 90 tablet 3  . polyethylene glycol (MIRALAX / GLYCOLAX) packet Take 17 g by mouth daily as needed for mild constipation.     . potassium chloride SA (K-DUR,KLOR-CON) 20 MEQ tablet Take 40 mEq by mouth daily.    Marland Kitchen spironolactone (ALDACTONE) 25 MG tablet Take 0.5 tablets (12.5 mg total) by mouth daily. 15 tablet 2  . torsemide (DEMADEX) 100 MG tablet Take 1 tablet (100 mg total) by mouth 2 (two) times daily. 180 tablet 3  . vitamin C (ASCORBIC ACID) 500 MG tablet Take 500 mg by mouth 2 (two) times daily.    Marland Kitchen apixaban (ELIQUIS) 2.5 MG TABS tablet Take 1 tablet (2.5 mg total) 2 (two) times daily by mouth. 180 tablet 3  . metolazone (ZAROXOLYN) 2.5 MG tablet Take 1 tablet (2.5 mg total) by mouth once a week. Take every  Saturday with extra 20 meq of Potassium 6 tablet 3   No current facility-administered medications for this encounter.     Allergies  Allergen Reactions  . Metformin And Related Other (See Comments)    "Made sugar go wild"   . Rivastigmine Nausea And Vomiting and Other (See Comments)    NO EXELON PATCHES!!      Social History   Socioeconomic History  . Marital status: Widowed    Spouse name: Not on file  . Number of children: Not on file  . Years of education: Not on file  . Highest education level: Not on file  Occupational History  . Not on file  Social Needs  . Financial resource strain: Not on file  . Food insecurity:    Worry: Not on file  Inability: Not on file  . Transportation needs:    Medical: Not on file    Non-medical: Not on file  Tobacco Use  . Smoking status: Never Smoker  . Smokeless tobacco: Never Used  Substance and Sexual Activity  . Alcohol use: No  . Drug use: No  . Sexual activity: Not on file  Lifestyle  . Physical activity:    Days per week: Not on file    Minutes per session: Not on file  . Stress: Not on file  Relationships  . Social connections:    Talks on phone: Not on file    Gets together: Not on file    Attends religious service: Not on file    Active member of club or organization: Not on file    Attends meetings of clubs or organizations: Not on file    Relationship status: Not on file  . Intimate partner violence:    Fear of current or ex partner: Not on file    Emotionally abused: Not on file    Physically abused: Not on file    Forced sexual activity: Not on file  Other Topics Concern  . Not on file  Social History Narrative   Married. Has 1 surviving child and 4 surviving grandchildren.       Family History  Problem Relation Age of Onset  . Coronary artery disease Sister   . Coronary artery disease Brother     Vitals:   11/25/17 1152  BP: 103/63  Pulse: 63  SpO2: 100%  Weight: 194 lb (88 kg)   Wt  Readings from Last 3 Encounters:  11/25/17 194 lb (88 kg)  11/18/17 188 lb (85.3 kg)  11/11/17 187 lb 12.8 oz (85.2 kg)    PHYSICAL EXAM General:  Appears chronically ill. Arrived in a motorized scooter. No resp difficulty Neck: JVP 8-9 cm with HJR, no thyromegaly or thyroid nodule.  Lungs: Clear to auscultation bilaterally with normal respiratory effort. CV: Nondisplaced PMI.  Heart regular S1/S2, no S3/S4, no murmur.  1+ edema to knees bilaterally.   Abdomen: Soft, nontender, no hepatosplenomegaly, no distention.  Skin: Intact without lesions or rashes.  Neurologic: Alert and oriented x 3.  Psych: Normal affect. Extremities: No clubbing or cyanosis.  HEENT: Normal.   ASSESSMENT & PLAN: 1. Chronic systolic CHF:  Ischemic cardiomyopathy.  Echo 08/26/16 with LVEF 10-15%, severe biatrial enlargement, decreased RV systolic function.  Most recent echo in 12/18 with EF 20-25%.  [place].  NYHA class II-III symptoms but minimally mobile due to orthopedic issues. PADP is at goal on CardioMems but weight is up and he has signs of right-sided failure.  - Continue torsemide 100 mg bid.  - Add metolazone 2.5 mg every Saturday, he will take an extra 20 mEq KCl with metolazone.  - Continue coreg 3.125 mg twice a day.   - Continue 12.5 mg spiro daily.    - Continue digoxin 0.0625 daily, check level today.  - BMET today and again in 10 days.    - He has LBBB-like IVCD (154 msec). CRT upgrade is a consideration => will discuss with EP.  - Wear graded compression stockings during the day.  2. CKD stage III: BMET today.   3. CAD: s/p CABG.  Coronary angiography 1/18 with patent grafts and no targets for revascularization.  No chest pain.  - Continue ASA and statin.   4. PAF:  NSR today.  - Continue amiodarone 200 mg daily.  Check LFTs  today.  He is on Synthroid for hypothyroidism.  He will need a regular eye exam.  - Continue Eliquis 2.5 mg BID (age, elevated creatinine). No bleeding  problems. Check CBC.  5. Osteoarthritis: Knee OA significantly limits activity.  6. Hyponatremia: We discussed cutting back on po fluid intake.   Followup in 1 month.   Loralie Champagne 11/27/2017

## 2017-11-29 ENCOUNTER — Telehealth (HOSPITAL_COMMUNITY): Payer: Self-pay

## 2017-11-29 MED ORDER — POTASSIUM CHLORIDE CRYS ER 20 MEQ PO TBCR: 60.0000 meq | EXTENDED_RELEASE_TABLET | Freq: Every day | ORAL | 3 refills | Status: DC

## 2017-12-02 ENCOUNTER — Other Ambulatory Visit (HOSPITAL_COMMUNITY): Payer: Self-pay | Admitting: *Deleted

## 2017-12-02 MED ORDER — POTASSIUM CHLORIDE CRYS ER 20 MEQ PO TBCR: 60.0000 meq | EXTENDED_RELEASE_TABLET | Freq: Every day | ORAL | 3 refills | Status: DC

## 2017-12-08 ENCOUNTER — Other Ambulatory Visit: Payer: Self-pay | Admitting: Cardiology

## 2017-12-08 ENCOUNTER — Other Ambulatory Visit (HOSPITAL_COMMUNITY): Payer: Self-pay

## 2017-12-08 MED ORDER — METOLAZONE 2.5 MG PO TABS: 2.5000 mg | ORAL_TABLET | ORAL | 3 refills | Status: AC

## 2017-12-08 MED ORDER — POTASSIUM CHLORIDE CRYS ER 20 MEQ PO TBCR: EXTENDED_RELEASE_TABLET | ORAL | 3 refills | Status: DC

## 2017-12-09 LAB — BASIC METABOLIC PANEL
BUN / CREAT RATIO: 27 — AB (ref 10–24)
BUN: 48 mg/dL — AB (ref 8–27)
CO2: 28 mmol/L (ref 20–29)
CREATININE: 1.79 mg/dL — AB (ref 0.76–1.27)
Calcium: 8.9 mg/dL (ref 8.6–10.2)
Chloride: 89 mmol/L — ABNORMAL LOW (ref 96–106)
GFR calc Af Amer: 40 mL/min/{1.73_m2} — ABNORMAL LOW (ref >59)
GFR calc non Af Amer: 35 mL/min/{1.73_m2} — ABNORMAL LOW (ref >59)
GLUCOSE: 151 mg/dL — AB (ref 65–99)
Potassium: 3.2 mmol/L — ABNORMAL LOW (ref 3.5–5.2)
Sodium: 126 mmol/L — ABNORMAL LOW (ref 134–144)

## 2017-12-12 ENCOUNTER — Telehealth (HOSPITAL_COMMUNITY): Payer: Self-pay | Admitting: Cardiology

## 2017-12-12 MED ORDER — POTASSIUM CHLORIDE CRYS ER 20 MEQ PO TBCR: 40.0000 meq | EXTENDED_RELEASE_TABLET | Freq: Two times a day (BID) | ORAL | 3 refills | Status: AC

## 2017-12-12 NOTE — Telephone Encounter (Signed)
Abnormal lab results received from [facility] Labs drawn 12/08/17 k 3.2 Cr 1.79 BUN 45 Na 126  Per VO AndyTillery,PA Increase potassium to 40 meQ BID, repeat labs in one week   Patient aware via son, and patient will have labs repeated at [facility] in [place] (fax # [phone]) as patient is no longer enrolled with [facility].

## 2017-12-13 NOTE — Telephone Encounter (Signed)
Faxed lab order over

## 2017-12-15 ENCOUNTER — Inpatient Hospital Stay (HOSPITAL_COMMUNITY)
Admission: AD | Admit: 2017-12-15 | Discharge: 2018-01-14 | DRG: 291 | Disposition: E | Payer: Medicare Other | Source: Other Acute Inpatient Hospital | Attending: Pulmonary Disease | Admitting: Pulmonary Disease

## 2017-12-15 ENCOUNTER — Inpatient Hospital Stay (HOSPITAL_COMMUNITY): Payer: Medicare Other

## 2017-12-15 DIAGNOSIS — I5043 Acute on chronic combined systolic (congestive) and diastolic (congestive) heart failure: Secondary | ICD-10-CM | POA: Diagnosis not present

## 2017-12-15 DIAGNOSIS — Z95810 Presence of automatic (implantable) cardiac defibrillator: Secondary | ICD-10-CM | POA: Diagnosis not present

## 2017-12-15 DIAGNOSIS — E1122 Type 2 diabetes mellitus with diabetic chronic kidney disease: Secondary | ICD-10-CM | POA: Diagnosis present

## 2017-12-15 DIAGNOSIS — D638 Anemia in other chronic diseases classified elsewhere: Secondary | ICD-10-CM | POA: Diagnosis present

## 2017-12-15 DIAGNOSIS — E871 Hypo-osmolality and hyponatremia: Secondary | ICD-10-CM | POA: Diagnosis present

## 2017-12-15 DIAGNOSIS — Z8249 Family history of ischemic heart disease and other diseases of the circulatory system: Secondary | ICD-10-CM | POA: Diagnosis not present

## 2017-12-15 DIAGNOSIS — Z993 Dependence on wheelchair: Secondary | ICD-10-CM | POA: Diagnosis not present

## 2017-12-15 DIAGNOSIS — I2510 Atherosclerotic heart disease of native coronary artery without angina pectoris: Secondary | ICD-10-CM | POA: Diagnosis present

## 2017-12-15 DIAGNOSIS — I340 Nonrheumatic mitral (valve) insufficiency: Secondary | ICD-10-CM | POA: Diagnosis not present

## 2017-12-15 DIAGNOSIS — M069 Rheumatoid arthritis, unspecified: Secondary | ICD-10-CM | POA: Diagnosis present

## 2017-12-15 DIAGNOSIS — I255 Ischemic cardiomyopathy: Secondary | ICD-10-CM | POA: Diagnosis not present

## 2017-12-15 DIAGNOSIS — N170 Acute kidney failure with tubular necrosis: Secondary | ICD-10-CM | POA: Diagnosis present

## 2017-12-15 DIAGNOSIS — I509 Heart failure, unspecified: Secondary | ICD-10-CM | POA: Diagnosis not present

## 2017-12-15 DIAGNOSIS — K7200 Acute and subacute hepatic failure without coma: Secondary | ICD-10-CM | POA: Diagnosis present

## 2017-12-15 DIAGNOSIS — Z7982 Long term (current) use of aspirin: Secondary | ICD-10-CM

## 2017-12-15 DIAGNOSIS — I252 Old myocardial infarction: Secondary | ICD-10-CM

## 2017-12-15 DIAGNOSIS — I248 Other forms of acute ischemic heart disease: Secondary | ICD-10-CM | POA: Diagnosis present

## 2017-12-15 DIAGNOSIS — J9690 Respiratory failure, unspecified, unspecified whether with hypoxia or hypercapnia: Secondary | ICD-10-CM

## 2017-12-15 DIAGNOSIS — J9621 Acute and chronic respiratory failure with hypoxia: Secondary | ICD-10-CM | POA: Diagnosis not present

## 2017-12-15 DIAGNOSIS — Z7901 Long term (current) use of anticoagulants: Secondary | ICD-10-CM

## 2017-12-15 DIAGNOSIS — E039 Hypothyroidism, unspecified: Secondary | ICD-10-CM | POA: Diagnosis present

## 2017-12-15 DIAGNOSIS — I493 Ventricular premature depolarization: Secondary | ICD-10-CM | POA: Diagnosis present

## 2017-12-15 DIAGNOSIS — E785 Hyperlipidemia, unspecified: Secondary | ICD-10-CM | POA: Diagnosis present

## 2017-12-15 DIAGNOSIS — Z951 Presence of aortocoronary bypass graft: Secondary | ICD-10-CM

## 2017-12-15 DIAGNOSIS — R451 Restlessness and agitation: Secondary | ICD-10-CM | POA: Diagnosis not present

## 2017-12-15 DIAGNOSIS — Z515 Encounter for palliative care: Secondary | ICD-10-CM

## 2017-12-15 DIAGNOSIS — Z7189 Other specified counseling: Secondary | ICD-10-CM

## 2017-12-15 DIAGNOSIS — I480 Paroxysmal atrial fibrillation: Secondary | ICD-10-CM | POA: Diagnosis present

## 2017-12-15 DIAGNOSIS — Z66 Do not resuscitate: Secondary | ICD-10-CM | POA: Diagnosis present

## 2017-12-15 DIAGNOSIS — Z79899 Other long term (current) drug therapy: Secondary | ICD-10-CM | POA: Diagnosis not present

## 2017-12-15 DIAGNOSIS — R05 Cough: Secondary | ICD-10-CM

## 2017-12-15 DIAGNOSIS — R570 Cardiogenic shock: Secondary | ICD-10-CM | POA: Diagnosis not present

## 2017-12-15 DIAGNOSIS — M25562 Pain in left knee: Secondary | ICD-10-CM

## 2017-12-15 DIAGNOSIS — Z794 Long term (current) use of insulin: Secondary | ICD-10-CM | POA: Diagnosis not present

## 2017-12-15 DIAGNOSIS — I5084 End stage heart failure: Secondary | ICD-10-CM | POA: Diagnosis present

## 2017-12-15 DIAGNOSIS — Z79890 Hormone replacement therapy (postmenopausal): Secondary | ICD-10-CM

## 2017-12-15 DIAGNOSIS — R0602 Shortness of breath: Secondary | ICD-10-CM | POA: Diagnosis present

## 2017-12-15 DIAGNOSIS — D6489 Other specified anemias: Secondary | ICD-10-CM | POA: Diagnosis present

## 2017-12-15 DIAGNOSIS — G9341 Metabolic encephalopathy: Secondary | ICD-10-CM | POA: Diagnosis present

## 2017-12-15 DIAGNOSIS — N184 Chronic kidney disease, stage 4 (severe): Secondary | ICD-10-CM | POA: Diagnosis present

## 2017-12-15 DIAGNOSIS — E876 Hypokalemia: Secondary | ICD-10-CM

## 2017-12-15 DIAGNOSIS — R34 Anuria and oliguria: Secondary | ICD-10-CM | POA: Diagnosis not present

## 2017-12-15 DIAGNOSIS — R059 Cough, unspecified: Secondary | ICD-10-CM

## 2017-12-15 DIAGNOSIS — N183 Chronic kidney disease, stage 3 (moderate): Secondary | ICD-10-CM | POA: Diagnosis not present

## 2017-12-15 DIAGNOSIS — I130 Hypertensive heart and chronic kidney disease with heart failure and stage 1 through stage 4 chronic kidney disease, or unspecified chronic kidney disease: Principal | ICD-10-CM | POA: Diagnosis present

## 2017-12-15 DIAGNOSIS — Z452 Encounter for adjustment and management of vascular access device: Secondary | ICD-10-CM

## 2017-12-15 DIAGNOSIS — M25561 Pain in right knee: Secondary | ICD-10-CM | POA: Diagnosis not present

## 2017-12-15 DIAGNOSIS — N171 Acute kidney failure with acute cortical necrosis: Secondary | ICD-10-CM

## 2017-12-15 DIAGNOSIS — L8990 Pressure ulcer of unspecified site, unspecified stage: Secondary | ICD-10-CM

## 2017-12-15 DIAGNOSIS — F0390 Unspecified dementia without behavioral disturbance: Secondary | ICD-10-CM | POA: Diagnosis present

## 2017-12-15 DIAGNOSIS — E11649 Type 2 diabetes mellitus with hypoglycemia without coma: Secondary | ICD-10-CM | POA: Diagnosis present

## 2017-12-15 DIAGNOSIS — E875 Hyperkalemia: Secondary | ICD-10-CM | POA: Diagnosis present

## 2017-12-15 DIAGNOSIS — G2581 Restless legs syndrome: Secondary | ICD-10-CM | POA: Diagnosis present

## 2017-12-15 LAB — CBC
HEMATOCRIT: 29.1 % — AB (ref 39.0–52.0)
HEMOGLOBIN: 9.2 g/dL — AB (ref 13.0–17.0)
MCH: 25.6 pg — ABNORMAL LOW (ref 26.0–34.0)
MCHC: 31.6 g/dL (ref 30.0–36.0)
MCV: 80.8 fL (ref 78.0–100.0)
Platelets: 179 10*3/uL (ref 150–400)
RBC: 3.6 MIL/uL — AB (ref 4.22–5.81)
RDW: 19.6 % — ABNORMAL HIGH (ref 11.5–15.5)
WBC: 8.8 10*3/uL (ref 4.0–10.5)

## 2017-12-15 LAB — HEPATIC FUNCTION PANEL
ALBUMIN: 3.2 g/dL — AB (ref 3.5–5.0)
ALT: 864 U/L — AB (ref 17–63)
AST: 1094 U/L — AB (ref 15–41)
Alkaline Phosphatase: 245 U/L — ABNORMAL HIGH (ref 38–126)
BILIRUBIN INDIRECT: 1.2 mg/dL — AB (ref 0.3–0.9)
Bilirubin, Direct: 1.1 mg/dL — ABNORMAL HIGH (ref 0.1–0.5)
TOTAL PROTEIN: 6.5 g/dL (ref 6.5–8.1)
Total Bilirubin: 2.3 mg/dL — ABNORMAL HIGH (ref 0.3–1.2)

## 2017-12-15 LAB — GLUCOSE, CAPILLARY
GLUCOSE-CAPILLARY: 175 mg/dL — AB (ref 65–99)
Glucose-Capillary: 151 mg/dL — ABNORMAL HIGH (ref 65–99)

## 2017-12-15 LAB — PROCALCITONIN: PROCALCITONIN: 0.24 ng/mL

## 2017-12-15 LAB — BASIC METABOLIC PANEL
Anion gap: 13 (ref 5–15)
BUN: 55 mg/dL — ABNORMAL HIGH (ref 6–20)
CHLORIDE: 89 mmol/L — AB (ref 101–111)
CO2: 25 mmol/L (ref 22–32)
Calcium: 9.2 mg/dL (ref 8.9–10.3)
Creatinine, Ser: 2.73 mg/dL — ABNORMAL HIGH (ref 0.61–1.24)
GFR calc Af Amer: 24 mL/min — ABNORMAL LOW (ref >60)
GFR, EST NON AFRICAN AMERICAN: 20 mL/min — AB (ref >60)
GLUCOSE: 166 mg/dL — AB (ref 65–99)
POTASSIUM: 5.9 mmol/L — AB (ref 3.5–5.1)
Sodium: 127 mmol/L — ABNORMAL LOW (ref 135–145)

## 2017-12-15 LAB — COOXEMETRY PANEL
Carboxyhemoglobin: 1.5 % (ref 0.5–1.5)
METHEMOGLOBIN: 1.5 % (ref 0.0–1.5)
O2 Saturation: 43.8 %
TOTAL HEMOGLOBIN: 8.6 g/dL — AB (ref 12.0–16.0)

## 2017-12-15 LAB — TROPONIN I: Troponin I: 0.06 ng/mL (ref <0.03)

## 2017-12-15 LAB — MAGNESIUM: Magnesium: 1.9 mg/dL (ref 1.7–2.4)

## 2017-12-15 LAB — PHOSPHORUS: Phosphorus: 4.4 mg/dL (ref 2.5–4.6)

## 2017-12-15 LAB — MRSA PCR SCREENING: MRSA by PCR: POSITIVE — AB

## 2017-12-15 MED ORDER — DOBUTAMINE IN D5W 4-5 MG/ML-% IV SOLN
3.0000 ug/kg/min | INTRAVENOUS | Status: DC
  Administered 2017-12-15: 2.5 ug/kg/min via INTRAVENOUS
  Administered 2017-12-17 – 2017-12-19 (×2): 5 ug/kg/min via INTRAVENOUS
  Administered 2017-12-21: 2.5 ug/kg/min via INTRAVENOUS
  Filled 2017-12-15 (×4): qty 250

## 2017-12-15 MED ORDER — NOREPINEPHRINE BITARTRATE 1 MG/ML IV SOLN
0.0000 ug/min | INTRAVENOUS | Status: DC
  Filled 2017-12-15: qty 4

## 2017-12-15 MED ORDER — CHLORHEXIDINE GLUCONATE 0.12 % MT SOLN
15.0000 mL | Freq: Two times a day (BID) | OROMUCOSAL | Status: DC
  Administered 2017-12-15 – 2017-12-16 (×2): 15 mL via OROMUCOSAL
  Filled 2017-12-15: qty 15

## 2017-12-15 MED ORDER — DEXTROSE 50 % IV SOLN
INTRAVENOUS | Status: AC
  Administered 2017-12-16 (×2): 50 mL
  Filled 2017-12-15: qty 50

## 2017-12-15 MED ORDER — SODIUM CHLORIDE 0.9 % IV SOLN
250.0000 mL | INTRAVENOUS | Status: DC | PRN
  Administered 2017-12-16: 250 mL via INTRAVENOUS

## 2017-12-15 MED ORDER — SODIUM CHLORIDE 0.9% FLUSH: 10.0000 mL | INTRAVENOUS | Status: DC | PRN

## 2017-12-15 MED ORDER — HEPARIN (PORCINE) IN NACL 100-0.45 UNIT/ML-% IJ SOLN
1250.0000 [IU]/h | INTRAMUSCULAR | Status: DC
  Administered 2017-12-15: 1250 [IU]/h via INTRAVENOUS
  Filled 2017-12-15: qty 250

## 2017-12-15 MED ORDER — MILRINONE LACTATE IN DEXTROSE 20-5 MG/100ML-% IV SOLN
0.2500 ug/kg/min | INTRAVENOUS | Status: DC
  Administered 2017-12-15: 0.25 ug/kg/min via INTRAVENOUS
  Filled 2017-12-15: qty 100

## 2017-12-15 MED ORDER — SODIUM CHLORIDE 0.9% FLUSH
10.0000 mL | Freq: Two times a day (BID) | INTRAVENOUS | Status: DC
  Administered 2017-12-15 – 2017-12-16 (×2): 10 mL
  Administered 2017-12-16: 30 mL
  Administered 2017-12-17 – 2017-12-22 (×6): 10 mL

## 2017-12-15 MED ORDER — LEVOTHYROXINE SODIUM 75 MCG PO TABS
75.0000 ug | ORAL_TABLET | Freq: Every day | ORAL | Status: DC
  Administered 2017-12-16 – 2017-12-23 (×8): 75 ug via ORAL
  Filled 2017-12-15 (×8): qty 1

## 2017-12-15 MED ORDER — ASPIRIN 81 MG PO CHEW
81.0000 mg | CHEWABLE_TABLET | Freq: Every day | ORAL | Status: DC
  Administered 2017-12-16 – 2017-12-19 (×4): 81 mg via ORAL
  Filled 2017-12-15 (×4): qty 1

## 2017-12-15 MED ORDER — INSULIN ASPART 100 UNIT/ML ~~LOC~~ SOLN
0.0000 [IU] | SUBCUTANEOUS | Status: DC
  Administered 2017-12-15 (×2): 3 [IU] via SUBCUTANEOUS
  Administered 2017-12-16 (×2): 5 [IU] via SUBCUTANEOUS
  Administered 2017-12-16: 3 [IU] via SUBCUTANEOUS
  Administered 2017-12-17: 2 [IU] via SUBCUTANEOUS
  Administered 2017-12-17 – 2017-12-18 (×3): 3 [IU] via SUBCUTANEOUS
  Administered 2017-12-18 (×2): 2 [IU] via SUBCUTANEOUS
  Administered 2017-12-18: 5 [IU] via SUBCUTANEOUS
  Administered 2017-12-18 – 2017-12-19 (×2): 3 [IU] via SUBCUTANEOUS
  Administered 2017-12-19: 5 [IU] via SUBCUTANEOUS
  Administered 2017-12-19 (×2): 3 [IU] via SUBCUTANEOUS
  Administered 2017-12-19: 2 [IU] via SUBCUTANEOUS
  Administered 2017-12-19: 3 [IU] via SUBCUTANEOUS
  Administered 2017-12-20: 8 [IU] via SUBCUTANEOUS
  Administered 2017-12-20: 3 [IU] via SUBCUTANEOUS
  Administered 2017-12-20: 2 [IU] via SUBCUTANEOUS
  Administered 2017-12-20: 3 [IU] via SUBCUTANEOUS
  Administered 2017-12-21: 8 [IU] via SUBCUTANEOUS
  Administered 2017-12-21: 3 [IU] via SUBCUTANEOUS
  Administered 2017-12-21: 5 [IU] via SUBCUTANEOUS
  Administered 2017-12-21 (×3): 3 [IU] via SUBCUTANEOUS
  Administered 2017-12-21: 2 [IU] via SUBCUTANEOUS
  Administered 2017-12-22 (×2): 3 [IU] via SUBCUTANEOUS
  Administered 2017-12-22 (×2): 5 [IU] via SUBCUTANEOUS
  Administered 2017-12-22: 3 [IU] via SUBCUTANEOUS
  Administered 2017-12-23: 5 [IU] via SUBCUTANEOUS
  Administered 2017-12-23 (×2): 3 [IU] via SUBCUTANEOUS

## 2017-12-15 MED ORDER — ORAL CARE MOUTH RINSE
15.0000 mL | Freq: Two times a day (BID) | OROMUCOSAL | Status: DC
  Administered 2017-12-16: 15 mL via OROMUCOSAL

## 2017-12-15 MED ORDER — FUROSEMIDE 10 MG/ML IJ SOLN
15.0000 mg/h | INTRAVENOUS | Status: DC
  Administered 2017-12-15: 8 mg/h via INTRAVENOUS
  Administered 2017-12-17: 6 mg/h via INTRAVENOUS
  Administered 2017-12-18: 15 mg/h via INTRAVENOUS
  Administered 2017-12-18: 10 mg/h via INTRAVENOUS
  Administered 2017-12-19: 15 mg/h via INTRAVENOUS
  Filled 2017-12-15: qty 20
  Filled 2017-12-15 (×8): qty 25
  Filled 2017-12-15: qty 20
  Filled 2017-12-15: qty 25

## 2017-12-15 MED ORDER — CHLORHEXIDINE GLUCONATE CLOTH 2 % EX PADS
6.0000 | MEDICATED_PAD | Freq: Every day | CUTANEOUS | Status: DC
  Administered 2017-12-16 – 2017-12-20 (×5): 6 via TOPICAL

## 2017-12-15 NOTE — Consult Note (Addendum)
Advanced Heart Failure Team Consult Note   Primary Physician: Primary Cardiologist:  Dr Aundra Dubin.   Reason for Consultation: A/C Systolic Heart Failure   HPI:    [name] is seen today for evaluation of heart failure at the request of Dr Maryan Rued.   [name] is a 83 y.o. male with CAD s/p CABG, hypertension, hyperlipidemia and chronic systolic and diastolic heart failure who returns for followup of dyspnea and CHF.   Admitted in 1/18 with acute on chornic systolic HF complicated by cardiogenic shock. Required support with both milrinone and norepinephrine. Developed transaminitis 2/2 shock liver.  He also developed atrial fibrillation with RVR requiring amiodarone to control. Tolvaptan given for hyponatremia.  L/RHC as below. Echo 08/26/16 LVEF 10-15%, Severe LAE, RV reduced, Severe RAE, Trivial pericardial perfusion. He was diuresed and titrated off milrinone/norepinephrine.    He was admitted in 8/18 with acute on chronic systolic CHF.  Echo in 8/18 showed EF 35-40%, mildly decreased RV systolic function.   He was admitted in 12/18 with acute on chronic systolic CHF.  Echo in 12/18 showed EF 20-25%, mild LVH, PASP 37 mmHg.  This admission may have been related to high sodium intake with his diet.   Admitted 09/09/2017 with volume overload. Diuresed with IV lasix and transitioned to torsemide 100 mg twice a day + metolazone weekly. Discharge weight was 193 pounds.   He had CardioMems implant in 4/19.    Last seen in clinic 11/25/17. Weight up in setting of not taking metolazone and increased fluid intake. Metolazone added back.   Presented to [facility] ED on 5/2 with progressive weakness and leg swelling. Now essentially wheelchair bound. SOB on any exertion.  Upon arrival to ED. Patient was noted to be bradycardiac with multiple PVCs, and K of 7.4. Crt 2.5 (up from 1.7) AST/ALT 1011/629. Treated with Bicarb/Insulin/Dextrose and transferred to [facility] for further  management.   Central line placed with co-ox 43.8% confirming cardiogenic shock. CXR with pulmonary edema. F/u K 5.9. Milrinone started but became hypotensive and switched to dobutamine. SBP in 80s now. Says he feels fine. Repeatedly asking for a drink of water     Echo 07/20/2017 Left ventricle: The cavity size was normal. Wall thickness was   increased in a pattern of mild LVH. Systolic function was   severely reduced. The estimated ejection fraction was in the   range of 20% to 25%. Diffuse hypokinesis. Doppler parameters are   consistent with a reversible restrictive pattern, indicative of   decreased left ventricular diastolic compliance and/or increased   left atrial pressure (grade 3 diastolic dysfunction). - Aortic valve: Valve area (VTI): 1.97 cm^2. Valve area (Vmax):   1.68 cm^2. Valve area (Vmean): 1.73 cm^2. - Left atrium: The atrium was severely dilated. - Right atrium: The atrium was severely dilated. - Pulmonary arteries: Systolic pressure was mildly increased. PA   peak pressure: 37 mm Hg (S).  Review of Systems: [y] = yes, '[ ]'$  = no   General: Weight gain Blue.Reese ]; Weight loss '[ ]'$ ; Anorexia '[ ]'$ ; Fatigue [ y]; Fever '[ ]'$ ; Chills '[ ]'$ ; Weakness Blue.Reese ]  Cardiac: Chest pain/pressure '[ ]'$ ; Resting SOB [Y ]; Exertional SOB Blue.Reese ]; Orthopnea [Y ]; Pedal Edema [Y ]; Palpitations '[ ]'$ ; Syncope '[ ]'$ ; Presyncope '[ ]'$ ; Paroxysmal nocturnal dyspnea'[ ]'$   Pulmonary: Cough '[ ]'$ ; Wheezing'[ ]'$ ; Hemoptysis'[ ]'$ ; Sputum '[ ]'$ ; Snoring '[ ]'$   GI: Vomiting'[ ]'$ ; Dysphagia'[ ]'$ ; Melena'[ ]'$ ; Hematochezia '[ ]'$ ;  Heartburn'[ ]'$ ; Abdominal pain '[ ]'$ ; Constipation '[ ]'$ ; Diarrhea '[ ]'$ ; BRBPR '[ ]'$   GU: Hematuria'[ ]'$ ; Dysuria '[ ]'$ ; Nocturia'[ ]'$   Vascular: Pain in legs with walking [Y ]; Pain in feet with lying flat '[ ]'$ ; Non-healing sores '[ ]'$ ; Stroke '[ ]'$ ; TIA '[ ]'$ ; Slurred speech '[ ]'$ ;  Neuro: Headaches'[ ]'$ ; Vertigo'[ ]'$ ; Seizures'[ ]'$ ; Paresthesias'[ ]'$ ;Blurred vision '[ ]'$ ; Diplopia '[ ]'$ ; Vision changes '[ ]'$   Ortho/Skin: Arthritis [ y]; Joint pain [Y  ]; Muscle pain '[ ]'$ ; Joint swelling '[ ]'$ ; Back Pain [Y ]; Rash '[ ]'$   Psych: Depression'[ ]'$ ; Anxiety'[ ]'$   Heme: Bleeding problems '[ ]'$ ; Clotting disorders '[ ]'$ ; Anemia '[ ]'$   Endocrine: Diabetes '[ ]'$ ; Thyroid dysfunction'[ ]'$   Home Medications Prior to Admission medications   Medication Sig Start Date End Date Taking? Authorizing Provider  amiodarone (PACERONE) 200 MG tablet Take 1 tablet (200 mg total) by mouth daily. 07/29/17   Lelon Perla, MD  apixaban (ELIQUIS) 2.5 MG TABS tablet Take 1 tablet (2.5 mg total) 2 (two) times daily by mouth. 06/20/17 09/18/17  Larey Dresser, MD  atorvastatin (LIPITOR) 10 MG tablet Take 10 mg by mouth at bedtime.     [provider]  benzonatate (TESSALON) 100 MG capsule Take 100 mg by mouth 3 (three) times daily as needed (for coughing).     [provider]  carvedilol (COREG) 3.125 MG tablet Take 3.125 mg by mouth 2 (two) times daily with a meal.    [provider]  carvedilol (COREG) 6.25 MG tablet Take 1 tablet (6.25 mg total) by mouth 2 (two) times daily. Patient not taking: Reported on 08/19/2017 07/29/17 10/27/17  Larey Dresser, MD  digoxin (LANOXIN) 0.125 MG tablet Take 0.5 tablets (0.0625 mg total) by mouth every other day. 06/09/17   Larey Dresser, MD  esomeprazole (NEXIUM) 40 MG capsule Take 40 mg by mouth daily.      [provider]  glyBURIDE (DIABETA) 5 MG tablet Take 5 mg by mouth daily with breakfast.    [provider]  HUMULIN R 100 UNIT/ML injection USE VIA PUMP AS DIRECTED WITH V-GO MACHINE AS DIRECTED. MAX DD OF 66 UNITS 07/04/17   [provider]  Insulin Disposable Pump (V-GO 30) KIT daily.  07/04/17   [provider]  levothyroxine (SYNTHROID, LEVOTHROID) 75 MCG tablet Take 1 tablet (75 mcg total) by mouth daily before breakfast. 07/23/17   Cherene Altes, MD  losartan (COZAAR) 25 MG tablet Take 0.5 tablets (12.5 mg total) by mouth daily. 11/19/16   Shirley Friar, PA-C    polyethylene glycol (MIRALAX / Floria Raveling) packet Take 17 g by mouth daily as needed for mild constipation.     [provider]  potassium chloride SA (K-DUR,KLOR-CON) 20 MEQ tablet Take 1 tablet (20 mEq total) by mouth daily. 06/09/17   Larey Dresser, MD  spironolactone (ALDACTONE) 25 MG tablet Take 25 mg by mouth at bedtime.     [provider]  torsemide (DEMADEX) 100 MG tablet Take 1 tablet (100 mg total) by mouth 2 (two) times daily. 05/02/17   Minus Breeding, MD  torsemide (DEMADEX) 100 MG tablet Take 100 mg by mouth 2 (two) times daily.    [provider]  vitamin C (ASCORBIC ACID) 500 MG tablet Take 500 mg by mouth 2 (two) times daily.    [provider]    Past Medical History: Past Medical History:  Diagnosis Date  .  AICD (automatic cardioverter/defibrillator) present    DOI 2008;   . Arthritis    "mostly in my knees" (07/20/2017)  . Congestive heart failure, unspecified   . Coronary atherosclerosis of unspecified type of vessel, native or graft   . Hypothyroidism   . Ischemic cardiomyopathy   . Myocardial infarction [facility]) ?2005  . Other and unspecified hyperlipidemia   . Peptic ulcer, unspecified site, unspecified as acute or chronic, without mention of hemorrhage, perforation, or obstruction   . RA (rheumatoid arthritis) ([place])   . Type II diabetes mellitus ([place])   . Unspecified essential hypertension   . Ventricular tachycardia ([place])    Rx via ICD 5 /2012    Past Surgical History: Past Surgical History:  Procedure Laterality Date  . CARDIAC CATHETERIZATION N/A 09/01/2016   Procedure: Right/Left Heart Cath and Coronary/Graft Angiography;  Surgeon: Larey Dresser, MD;  Location: [place] CV LAB;  Service: Cardiovascular;  Laterality: N/A;  . CARPAL TUNNEL RELEASE Left 05/2008   Archie Endo 12/17/2010  . CARPAL TUNNEL RELEASE Right 03/2010   Archie Endo 04/08/2010  . CORONARY ARTERY BYPASS GRAFT  2000  . IMPLANTABLE CARDIOVERTER  DEFIBRILLATOR (ICD) GENERATOR CHANGE N/A 01/25/2014   Procedure: ICD GENERATOR CHANGE;  Surgeon: Deboraha Sprang, MD;  Location: [facility] CATH LAB;  Service: Cardiovascular;  Laterality: N/A;  . INGUINAL HERNIA REPAIR Right     Family History: Family History  Problem Relation Age of Onset  . Coronary artery disease Sister   . Coronary artery disease Brother     Social History: Social History   Socioeconomic History  . Marital status: Widowed    Spouse name: Not on file  . Number of children: Not on file  . Years of education: Not on file  . Highest education level: Not on file  Occupational History  . Not on file  Social Needs  . Financial resource strain: Not on file  . Food insecurity:    Worry: Not on file    Inability: Not on file  . Transportation needs:    Medical: Not on file    Non-medical: Not on file  Tobacco Use  . Smoking status: Never Smoker  . Smokeless tobacco: Never Used  Substance and Sexual Activity  . Alcohol use: No  . Drug use: No  . Sexual activity: Not on file  Lifestyle  . Physical activity:    Days per week: Not on file    Minutes per session: Not on file  . Stress: Not on file  Relationships  . Social connections:    Talks on phone: Not on file    Gets together: Not on file    Attends religious service: Not on file    Active member of club or organization: Not on file    Attends meetings of clubs or organizations: Not on file    Relationship status: Not on file  Other Topics Concern  . Not on file  Social History Narrative   Married. Has 1 surviving child and 4 surviving grandchildren.     Allergies:  Allergies  Allergen Reactions  . Metformin And Related Other (See Comments)    "Made sugar go wild"   . Rivastigmine Nausea And Vomiting and Other (See Comments)    NO EXELON PATCHES!!  . Penicillins Rash    Objective:    Vital Signs:   Temp:  [97.6 F (36.4 C)-97.7 F (36.5 C)] 97.6 F (36.4 C) (05/02 2000) Pulse Rate:   [72-76] 76 (05/02 2130) Resp:  [  11-21] 15 (05/02 2130) BP: (81-148)/(46-106) 85/59 (05/02 2130) SpO2:  [97 %-100 %] 100 % (05/02 2130) Weight:  [89.6 kg (197 lb 8.5 oz)] 89.6 kg (197 lb 8.5 oz) (05/02 1600) Last BM Date: 12/19/2017  Weight change: Filed Weights   12/25/2017 1600  Weight: 89.6 kg (197 lb 8.5 oz)    Intake/Output:   Intake/Output Summary (Last 24 hours) at 12/19/2017 2156 Last data filed at 12/24/2017 2100 Gross per 24 hour  Intake 42.52 ml  Output 175 ml  Net -132.48 ml      Physical Exam  CVP 7 General:  Elderly. Sitting up in bed  No resp difficulty HEENT: normal Neck: supple. JVP 7 Carotids 2+ bilat; no bruits. No lymphadenopathy or thryomegaly appreciated. Cor: PMI laterally displaced. Regular rate & rhythm. +s3 Lungs: basilar crackles Abdomen: soft, nontender, nondistended. No hepatosplenomegaly. No bruits or masses. Good bowel sounds. Extremities: no cyanosis, clubbing, rash, 3+ edema Neuro: alert. ablet to engage in conwersation. Repeatedly asking for water. Doe not recall much,. cranial nerves grossly intact. moves all 4 extremities w/o difficulty. Affect pleasant   Telemetry   NSR 60-70s Personally reviewed   EKG   NSR 63 with IVCD (196m) +PVC Personally reviewed   Labs   Basic Metabolic Panel: Recent Labs  Lab 12/22/2017 1601  NA 127*  K 5.9*  CL 89*  CO2 25  GLUCOSE 166*  BUN 55*  CREATININE 2.73*  CALCIUM 9.2  MG 1.9  PHOS 4.4    Liver Function Tests: Recent Labs  Lab 01/03/2018 1601  AST 1,094*  ALT 864*  ALKPHOS 245*  BILITOT 2.3*  PROT 6.5  ALBUMIN 3.2*   No results for input(s): LIPASE, AMYLASE in the last 168 hours. No results for input(s): AMMONIA in the last 168 hours.  CBC: Recent Labs  Lab 01/02/2018 1601  WBC 8.8  HGB 9.2*  HCT 29.1*  MCV 80.8  PLT 179    Cardiac Enzymes: Recent Labs  Lab 01/10/2018 1601  TROPONINI 0.06*    BNP: BNP (last 3 results) Recent Labs    07/19/17 1819 08/25/17 1140  09/09/17 0525  BNP 926.3* 1,102.9* 1,105.4*    ProBNP (last 3 results) No results for input(s): PROBNP in the last 8760 hours.   CBG: Recent Labs  Lab 12/20/2017 1619 12/14/2017 2001  GLUCAP 175* 151*    Coagulation Studies: No results for input(s): LABPROT, INR in the last 72 hours.   Imaging   Dg Chest Port 1 View  Result Date: 01/03/2018 CLINICAL DATA:  Patient with central line placement EXAM: PORTABLE CHEST 1 VIEW COMPARISON:  Chest radiograph 01/12/2018 FINDINGS: Single lead AICD device overlies the left hemithorax, lead is stable in position. Interval insertion right IJ central venous catheter tip projecting over the superior vena cava. Stable cardiomegaly status post median sternotomy. Grossly unchanged left greater than right mid lower lung pulmonary consolidation. Left pleural effusion not excluded. IMPRESSION: New right IJ central venous catheter tip projecting over the superior vena cava. Re demonstrated bilateral perihilar interstitial opacities which may represent edema. Left greater than right mid and lower lung heterogeneous opacities may represent atelectasis or infection. Electronically Signed   By: DLovey NewcomerM.D.   On: 12/19/2017 18:40   Dg Chest Port 1 View  Result Date: 01/03/2018 CLINICAL DATA:  Acute on chronic respiratory failure EXAM: PORTABLE CHEST 1 VIEW COMPARISON:  Chest radiograph 01/06/2018 FINDINGS: Single lead AICD device overlies the left hemithorax, leads stable in position. Stable cardiomegaly. Low lung volumes. Grossly unchanged  mid lower lung heterogeneous opacities. Bilateral interstitial opacities. Persistent retrocardiac consolidation. IMPRESSION: Perihilar interstitial opacities may represent mild edema. Similar mid and lower lung heterogeneous opacities left greater than right which may represent atelectasis. Infection not excluded. Electronically Signed   By: Lovey Newcomer M.D.   On: 12/14/2017 17:56     Medications:     . [START ON 12/16/2017]  aspirin  81 mg Oral Daily  . chlorhexidine  15 mL Mouth Rinse BID  . [START ON 12/16/2017] Chlorhexidine Gluconate Cloth  6 each Topical Daily  . insulin aspart  0-15 Units Subcutaneous Q4H  . [START ON 12/16/2017] levothyroxine  75 mcg Oral QAC breakfast  . [START ON 12/16/2017] mouth rinse  15 mL Mouth Rinse q12n4p  . sodium chloride flush  10-40 mL Intracatheter Q12H   . sodium chloride    . DOBUTamine 2.5 mcg/kg/min (12/24/2017 2220)  . furosemide (LASIX) infusion 8 mg/hr (12/31/2017 2100)  . heparin 1,250 Units/hr (01/09/2018 2100)  . milrinone Stopped (12/24/2017 2220)      Patient Profile   [name] is a 83 y.o. male with CAD s/p CABG, hypertension, hyperlipidemia and chronic systolic and diastolic heart failure presenting today with sob at rest and increased leg edema.   Assessment/Plan   1. Acute on chronic systolic heart failure -> cardiogenic shock - ECHO 12/18: EF 20-25%  - He is end-stage with shock and multi-system organ failure. Co-ox 43% - Milrinone started but became hypotensive now on dobutamine 2.5. Can titrate or add NE for BP support as needed - Hold home HF meds with shock AKI - CVP only 7 and recent Cardiomems ok but marked LE edema. Place TED hose. Continue lasix gtt for now. - Given end-stage HF, multi-system organ failure, poor QOL and dementia suspect Palliative Care is best option. Dr. Aundra Dubin to see in am.   2. Acute on chronic renal failure, now stage IV - Due to cardiorenal/shock. Start inotropes - Follow renal function - Likely not HD candidate. As above, Palliative care likely only option   3. Shock liver - support with inotropes  4. Hyperkalemia - hopefully will improve with inotropes and diuresis  - not candidate for emergent CVVHD  5. PAF - In NSR today. Continue eliquis 2.5 mg twice a day  6. CAD  -s/p CABG. Coronary angiography 1/18 with patent graft and no targets for revascularization.   - No S/S ischemia.   - Off statin with shock  liver   7.Hyponatremia - relatively stable at 127  8. Code status - DNR/DNI. Ok with [place].  - Palliative Consult in am   CRITICAL CARE Performed by: Glori Bickers  Total critical care time: 45 minutes  Critical care time was exclusive of separately billable procedures and treating other patients.  Critical care was necessary to treat or prevent imminent or life-threatening deterioration.  Critical care was time spent personally by me (independent of midlevel providers or residents) on the following activities: development of treatment plan with patient and/or surrogate as well as nursing, discussions with consultants, evaluation of patient's response to treatment, examination of patient, obtaining history from patient or surrogate, ordering and performing treatments and interventions, ordering and review of laboratory studies, ordering and review of radiographic studies, pulse oximetry and re-evaluation of patient's condition.   Admit to telemetry. 24-48 hours.   Medication concerns reviewed with patient and pharmacy team. Barriers identified: none  Length of Stay: 0  Glori Bickers, MD  01/08/2018, 9:56 PM  Advanced Heart Failure Team  Pager [phone] (M-F; [place])  Please contact [facility] for night-coverage after hours (4p -7a ) and weekends on [email or web address]

## 2017-12-15 NOTE — Progress Notes (Signed)
PCCM Interval Note   Central Line placed. COOX O2 Saturation 43.9. Reported to Doctor Bensimhon who advised to start milrinone at 0.25 mcg/kg/min, however, If patient with systolic <80 overnight start dobutamine at 2.5 mcg/kg/min and d/c milrinone.   Jovita Kussmaul, AGACNP-BC [facility]  Pgr: [phone]  PCCM Pgr: [phone]

## 2017-12-15 NOTE — Progress Notes (Signed)
ANTICOAGULATION CONSULT NOTE - Initial Consult  Pharmacy Consult for Heparin (Eliquis on hold) Indication: atrial fibrillation  Allergies  Allergen Reactions  . Metformin And Related Other (See Comments)    "Made sugar go wild"   . Rivastigmine Nausea And Vomiting and Other (See Comments)    NO EXELON PATCHES!!  . Penicillins Rash    Patient Measurements: Height: 5\' 10"  (177.8 cm) Weight: 197 lb 8.5 oz (89.6 kg) IBW/kg (Calculated) : 73 Heparin Dosing Weight: 89.6 kg  Vital Signs: Temp: 97.7 F (36.5 C) (05/02 1621) Temp Source: Oral (05/02 1621) BP: 91/54 (05/02 1711)  Labs: Recent Labs    2017-12-31 1601  HGB 9.2*  HCT 29.1*  PLT 179  CREATININE 2.73*  TROPONINI 0.06*    Estimated Creatinine Clearance: 24.3 mL/min (A) (by C-G formula based on SCr of 2.73 mg/dL (H)).   Medical History: Past Medical History:  Diagnosis Date  . AICD (automatic cardioverter/defibrillator) present    DOI 2008;   . Arthritis    "mostly in my knees" (07/20/2017)  . Congestive heart failure, unspecified   . Coronary atherosclerosis of unspecified type of vessel, native or graft   . Hypothyroidism   . Ischemic cardiomyopathy   . Myocardial infarction [facility]) ?2005  . Other and unspecified hyperlipidemia   . Peptic ulcer, unspecified site, unspecified as acute or chronic, without mention of hemorrhage, perforation, or obstruction   . RA (rheumatoid arthritis) ([facility])   . Type II diabetes mellitus ([facility])   . Unspecified essential hypertension   . Ventricular tachycardia ([facility])    Rx via ICD 5 /2012   Assessment:  83 yr old male to begin IV heparin for atrial fibrillation.  Takes Eliquis 2.5 mg BID at home, which is to be held. Last Eliquis dose reported ~8am today.  PT 15.5 sec, INR 1.6, aPTT 30.8 at [place].    Hgb 10.3, platelet count 203 at [place] > 9.3 and 179 at [facility].    Transferred to [facility] from [facility] with cardiogenic shock vs cardiorenal syndrome.  SGOT 1011, SGPT 629 and  total bilirubin 2.1 at [place]. Slight trend up with labs at [facility] tonight.  Creatinine 2.5 at [facility], 2.73 at [facility].     Expect heparin levels to be falsely elevated due to recent Eliquis doses, so will use aPTTs for heparin monitoring until aPTTs correlate with heparin levels.     Goal of Therapy:  Heparin level 0.3-0.7 units/ml aPTT 66-102 seconds Monitor platelets by anticoagulation protocol: Yes   Plan:   Heparin drip to begin ~8pm at 1250 units/hr.  Heparin level and aPTT ~8 hrs after drip begins.  Daily aPTT, heparin level and CBC.  Eliquis on hold.  [facility], Dennie Fetters Pager: [phone] 12-31-17,6:27 PM

## 2017-12-15 NOTE — Procedures (Signed)
Central Venous Catheter Insertion Procedure Note CROSBY ORIORDAN [id number] August 14, 1935  Procedure: Insertion of Central Venous Catheter Indications: Assessment of intravascular volume, Drug and/or fluid administration and Frequent blood sampling  Procedure Details Consent: Risks of procedure as well as the alternatives and risks of each were explained to the (patient/caregiver).  Consent for procedure obtained. Time Out: Verified patient identification, verified procedure, site/side was marked, verified correct patient position, special equipment/implants available, medications/allergies/relevent history reviewed, required imaging and test results available.  Performed  Maximum sterile technique was used including antiseptics, cap, gloves, gown, hand hygiene, mask and sheet. Skin prep: Chlorhexidine; local anesthetic administered A antimicrobial bonded/coated triple lumen catheter was placed in the right internal jugular vein using the Seldinger technique.  Evaluation Blood flow good Complications: No apparent complications Patient did tolerate procedure well. Chest X-ray ordered to verify placement.  CXR: pending.  Jovita Kussmaul, AGACNP-BC [facility]  Pgr: [phone]  PCCM Pgr: [phone]

## 2017-12-15 NOTE — Progress Notes (Signed)
CRITICAL VALUE ALERT  Critical Value:  Troponin 0.06  Date & Time Notied:  01/12/2018 @1800   Provider Notified: NP  Orders Received: No new orders

## 2017-12-15 NOTE — H&P (Addendum)
PULMONARY / CRITICAL CARE MEDICINE   Name: [name] MRN: [id number] DOB: Jan 02, 1935    ADMISSION DATE:  12/22/2017  REFERRING MD:  [facility] Oval Linsey)   CHIEF COMPLAINT: Cardiogenic shock   HISTORY OF PRESENT ILLNESS:   83 year old male with history of CAD status post CABG (2000), hypertension, ischemic cardiomyopathy (EF 20 to 25% 07/20/2017), rheumatoid arthritis, stage III CKD, PAF on Eliquis, diabetes, chronic combined heart failure followed in heart failure clinic. With five admissions in last 10 months related to acute on chronic heart failure.   Presents to [facility] ED on 5/2 with progressive weakness and leg swelling. Upon arrival Patient was noted to be bradycardiac with multiple PVCs, and K of 7.4. Crt 2.5. AST/ALT 1011/629. Treated with Bicarb/Insulin/Dextrose and transferred to [facility] for further management.   Patient states that on 5/1 he had a fall while in was transferring from chair to wheelchair. At baseline he is wheelchair bound due to swelling and weakness in lower extremities. Voices that his son help with his medical decisions and he lives with grand-daughter and her children.   PAST MEDICAL HISTORY :  He  has a past medical history of AICD (automatic cardioverter/defibrillator) present, Arthritis, Congestive heart failure, unspecified, Coronary atherosclerosis of unspecified type of vessel, native or graft, Hypothyroidism, Ischemic cardiomyopathy, Myocardial infarction ([place]) (?2005), Other and unspecified hyperlipidemia, Peptic ulcer, unspecified site, unspecified as acute or chronic, without mention of hemorrhage, perforation, or obstruction, RA (rheumatoid arthritis) ([place]), Type II diabetes mellitus ([place]), Unspecified essential hypertension, and Ventricular tachycardia ([place]).  PAST SURGICAL HISTORY: He  has a past surgical history that includes implantable cardioverter defibrillator (icd) generator change (N/A, 01/25/2014); Cardiac catheterization (N/A, 09/01/2016);  Carpal tunnel release (Left, 05/2008); Carpal tunnel release (Right, 03/2010); Inguinal hernia repair (Right); and Coronary artery bypass graft (2000).  Allergies  Allergen Reactions  . Metformin And Related Other (See Comments)    "Made sugar go wild"   . Rivastigmine Nausea And Vomiting and Other (See Comments)    NO EXELON PATCHES!!  . Penicillins Rash    No current facility-administered medications on file prior to encounter.    Current Outpatient Medications on File Prior to Encounter  Medication Sig  . acetaminophen (TYLENOL) 325 MG tablet Take 650 mg by mouth every 6 (six) hours as needed for mild pain or moderate pain.   Marland Kitchen amiodarone (PACERONE) 200 MG tablet Take 1 tablet (200 mg total) by mouth daily.  Marland Kitchen apixaban (ELIQUIS) 2.5 MG TABS tablet Take 1 tablet (2.5 mg total) 2 (two) times daily by mouth.  Marland Kitchen aspirin 81 MG chewable tablet Chew by mouth daily.  Marland Kitchen atorvastatin (LIPITOR) 10 MG tablet Take 10 mg by mouth at bedtime.   . carvedilol (COREG) 3.125 MG tablet Take 1 tablet (3.125 mg total) by mouth 2 (two) times daily with a meal.  . digoxin (LANOXIN) 0.125 MG tablet Take 0.5 tablets (0.0625 mg total) by mouth every other day.  . esomeprazole (NEXIUM) 40 MG capsule Take 40 mg by mouth daily.    Marland Kitchen glyBURIDE (DIABETA) 5 MG tablet Take 5 mg by mouth daily with breakfast.  . HUMULIN R 100 UNIT/ML injection USE VIA PUMP AS DIRECTED WITH V-GO MACHINE AS DIRECTED. MAX DD OF 66 UNITS  . hydrocortisone 2.5 % cream Apply 1 application topically daily as needed. Apply to external ear canals, only as needed for itching   . Insulin Disposable Pump (V-GO 30) KIT daily.   Marland Kitchen levothyroxine (SYNTHROID, LEVOTHROID) 75 MCG tablet Take 1 tablet (  75 mcg total) by mouth daily before breakfast.  . metolazone (ZAROXOLYN) 2.5 MG tablet Take 1 tablet (2.5 mg total) by mouth once a week. Take every Saturday with extra 20 meq of Potassium and as prescribed  . polyethylene glycol (MIRALAX / GLYCOLAX)  packet Take 17 g by mouth daily as needed for mild constipation.   . potassium chloride SA (K-DUR,KLOR-CON) 20 MEQ tablet Take 2 tablets (40 mEq total) by mouth 2 (two) times daily.  Marland Kitchen spironolactone (ALDACTONE) 25 MG tablet Take 0.5 tablets (12.5 mg total) by mouth daily.  Marland Kitchen torsemide (DEMADEX) 100 MG tablet Take 1 tablet (100 mg total) by mouth 2 (two) times daily.  . vitamin C (ASCORBIC ACID) 500 MG tablet Take 500 mg by mouth 2 (two) times daily.    FAMILY HISTORY:  His indicated that his mother is deceased. He indicated that his father is deceased. He indicated that the status of his sister is unknown. He indicated that the status of his brother is unknown. He indicated that his maternal grandmother is deceased. He indicated that his maternal grandfather is deceased. He indicated that his paternal grandmother is deceased. He indicated that his paternal grandfather is deceased.   SOCIAL HISTORY: He  reports that he has never smoked. He has never used smokeless tobacco. He reports that he does not drink alcohol or use drugs.  REVIEW OF SYSTEMS:   All negative; except for those that are bolded, which indicate positives.  Constitutional: weight loss, weight gain, night sweats, fevers, chills, fatigue, weakness.  HEENT: headaches, sore throat, sneezing, nasal congestion, post nasal drip, difficulty swallowing, tooth/dental problems, visual complaints, visual changes, ear aches. Neuro: difficulty with speech, weakness, numbness, ataxia. CV:  chest pain, orthopnea, PND, swelling in lower extremities, dizziness, palpitations, syncope.  Resp: cough, hemoptysis, dyspnea, wheezing. GI: heartburn, indigestion, abdominal pain, nausea, vomiting, diarrhea, constipation, change in bowel habits, loss of appetite, hematemesis, melena, hematochezia.  GU: dysuria, change in color of urine, urgency or frequency, flank pain, hematuria. MSK: joint pain or swelling, decreased range of motion. Psych: change in  mood or affect, depression, anxiety, suicidal ideations, homicidal ideations. Skin: rash, itching, bruising.   SUBJECTIVE:    VITAL SIGNS: BP (!) 91/54   Temp 97.7 F (36.5 C) (Oral)   Resp 15   Ht _0  (1.778 m)   Wt 89.6 kg (197 lb 8.5 oz)   SpO2 100%   BMI 28.34 kg/m   HEMODYNAMICS:    VENTILATOR SETTINGS:    INTAKE / OUTPUT: No intake/output data recorded.  PHYSICAL EXAMINATION: General:  Chronically ill appearing elderly male, no distress  Neuro:  Alert, oriented to self and place, disoriented to situation/time, follows commands  HEENT:  MMM Cardiovascular:  Irregular, no MRG Lungs:  Diminished to bases, no wheeze, non-labored  Abdomen:  Soft, non-tender  Musculoskeletal:  +3 BLE edema  Skin:  Multiple skin abrasions and bruising noted to all extremities   LABS:  BMET Recent Labs  Lab 01/13/2018 1601  NA 127*  K 5.9*  CL 89*  CO2 25  BUN 55*  CREATININE 2.73*  GLUCOSE 166*    Electrolytes Recent Labs  Lab 01/12/2018 1601  CALCIUM 9.2  MG 1.9  PHOS 4.4    CBC Recent Labs  Lab 01/02/2018 1601  WBC 8.8  HGB 9.2*  HCT 29.1*  PLT 179    Coag's No results for input(s): APTT, INR in the last 168 hours.  Sepsis Markers No results for input(s): LATICACIDVEN, PROCALCITON, O2SATVEN in  the last 168 hours.  ABG No results for input(s): PHART, PCO2ART, PO2ART in the last 168 hours.  Liver Enzymes Recent Labs  Lab 12/17/2017 1601  AST 1,094*  ALT 864*  ALKPHOS 245*  BILITOT 2.3*  ALBUMIN 3.2*    Cardiac Enzymes No results for input(s): TROPONINI, PROBNP in the last 168 hours.  Glucose Recent Labs  Lab 01/09/2018 1619  GLUCAP 175*    Imaging No results found.   STUDIES:  CXR 5/2 >>   CULTURES: None.   ANTIBIOTICS: None.   SIGNIFICANT EVENTS: 5/2 > Arrived to Hospital   LINES/TUBES: PIV   DISCUSSION: 83 year old male in cardiogenic shock presents hyperkalemic and bradycardiac. Transferred from [place] to [facility]  for further management.   ASSESSMENT / PLAN:  PULMONARY A: No issues  P:   Maintain Oxygen Saturation >92 Pulmonary Hygiene  DNI, BiPAP Okay   CARDIOVASCULAR A:  Cardiogenic Shock in setting of decompensated chronic systolic/diastolic HF (EF 89-21) vs Cardiorenal syndrome  H/O CAD s/p CABG, HTN, HLD, PAF on Eliquis  P:  Cardiac Monitoring  Heart Failure consulted > recommends placing CVC, obtaining COOX > Based on this ?Milrinone  Continue ASA Heparin gtt (hold eliquis)  Lasix gtt  Levophed for MAP goal >65  Hold Lipitor, Coreg, Dig, Spirolactone, Torsemide  Hold amiodarone in setting of Transaminase  Trend Troponin   RENAL A:   Acute on Chronic Kidney Disease Stage 4 (Baseline Crt 1.7-1.8)  Hyperkalemia  Per Family has received Multiple does of K from cardiology office for hypokalemia over last 2 days as well as 2 doses of metolazone, also takes spironolactone  Hyponatremia  P:   Trend BMP  Lasix gtt as above   GASTROINTESTINAL A:   Transaminase in setting of cardiogenic shock  AST/ALT 1011/629 >> P:   Ice Chips only > Fluid restriction  LFT   HEMATOLOGIC A:   Anticoagulation needs in setting of PAF Anemia of Chronic Disease  P:  Trend CBC  Heparin gtt as above   INFECTIOUS A:   No issues  P:   Trend WBC and Fever Curve Trend PCT   ENDOCRINE A:   DM   P:   Trend Glucose  SSI   NEUROLOGIC A:   Memory Loss (Family reports dementia)   Multiple Fall secondary to weakness  P:   Monitor  PT/OT > May need SNF placement, currently leaves with grand-daughter    FAMILY  - Updates: Will update Son and discuss GOC when arrives to bedside   ---When family arrived to unit, discussed [place]. Family understands that patient is chronically ill with multiple co-morbidites. Agree that intubation and CPR are not in the patient's best interest, however, would like to attempt medical management over the next 48 hours to see if there is improvement. > DNI/no CPR,  okay with BiPAP. Palliative care consulted.    - Inter-disciplinary family meet or Palliative Care meeting due by: 12/22/2017    Hayden Pedro, AGACNP-BC [facility]  Pgr: [phone]  PCCM Pgr: [phone]

## 2017-12-16 ENCOUNTER — Other Ambulatory Visit (HOSPITAL_COMMUNITY): Payer: Self-pay | Admitting: Cardiology

## 2017-12-16 ENCOUNTER — Inpatient Hospital Stay (HOSPITAL_COMMUNITY): Payer: Medicare Other

## 2017-12-16 DIAGNOSIS — E876 Hypokalemia: Secondary | ICD-10-CM

## 2017-12-16 DIAGNOSIS — I340 Nonrheumatic mitral (valve) insufficiency: Secondary | ICD-10-CM

## 2017-12-16 LAB — BASIC METABOLIC PANEL
Anion gap: 14 (ref 5–15)
BUN: 49 mg/dL — ABNORMAL HIGH (ref 6–20)
CHLORIDE: 84 mmol/L — AB (ref 101–111)
CO2: 28 mmol/L (ref 22–32)
Calcium: 8.2 mg/dL — ABNORMAL LOW (ref 8.9–10.3)
Creatinine, Ser: 2.49 mg/dL — ABNORMAL HIGH (ref 0.61–1.24)
GFR calc non Af Amer: 23 mL/min — ABNORMAL LOW (ref >60)
GFR, EST AFRICAN AMERICAN: 27 mL/min — AB (ref >60)
Glucose, Bld: 204 mg/dL — ABNORMAL HIGH (ref 65–99)
Potassium: 3.8 mmol/L (ref 3.5–5.1)
SODIUM: 126 mmol/L — AB (ref 135–145)

## 2017-12-16 LAB — COMPREHENSIVE METABOLIC PANEL
ALT: 875 U/L — AB (ref 17–63)
AST: 1074 U/L — AB (ref 15–41)
Albumin: 3 g/dL — ABNORMAL LOW (ref 3.5–5.0)
Alkaline Phosphatase: 195 U/L — ABNORMAL HIGH (ref 38–126)
Anion gap: 13 (ref 5–15)
BILIRUBIN TOTAL: 1.7 mg/dL — AB (ref 0.3–1.2)
BUN: 52 mg/dL — AB (ref 6–20)
CO2: 28 mmol/L (ref 22–32)
CREATININE: 2.61 mg/dL — AB (ref 0.61–1.24)
Calcium: 8.5 mg/dL — ABNORMAL LOW (ref 8.9–10.3)
Chloride: 89 mmol/L — ABNORMAL LOW (ref 101–111)
GFR calc Af Amer: 25 mL/min — ABNORMAL LOW (ref >60)
GFR, EST NON AFRICAN AMERICAN: 22 mL/min — AB (ref >60)
Glucose, Bld: 32 mg/dL — CL (ref 65–99)
Potassium: 3 mmol/L — ABNORMAL LOW (ref 3.5–5.1)
Sodium: 130 mmol/L — ABNORMAL LOW (ref 135–145)
TOTAL PROTEIN: 6 g/dL — AB (ref 6.5–8.1)

## 2017-12-16 LAB — ECHOCARDIOGRAM COMPLETE
HEIGHTINCHES: 70 in
Weight: 3068.8 oz

## 2017-12-16 LAB — CBC
HEMATOCRIT: 25.8 % — AB (ref 39.0–52.0)
Hemoglobin: 8.3 g/dL — ABNORMAL LOW (ref 13.0–17.0)
MCH: 25.9 pg — ABNORMAL LOW (ref 26.0–34.0)
MCHC: 32.2 g/dL (ref 30.0–36.0)
MCV: 80.4 fL (ref 78.0–100.0)
Platelets: 167 10*3/uL (ref 150–400)
RBC: 3.21 MIL/uL — AB (ref 4.22–5.81)
RDW: 19.7 % — AB (ref 11.5–15.5)
WBC: 6.6 10*3/uL (ref 4.0–10.5)

## 2017-12-16 LAB — GLUCOSE, CAPILLARY
GLUCOSE-CAPILLARY: 178 mg/dL — AB (ref 65–99)
GLUCOSE-CAPILLARY: 40 mg/dL — AB (ref 65–99)
GLUCOSE-CAPILLARY: 81 mg/dL (ref 65–99)
Glucose-Capillary: 124 mg/dL — ABNORMAL HIGH (ref 65–99)
Glucose-Capillary: 130 mg/dL — ABNORMAL HIGH (ref 65–99)
Glucose-Capillary: 212 mg/dL — ABNORMAL HIGH (ref 65–99)
Glucose-Capillary: 227 mg/dL — ABNORMAL HIGH (ref 65–99)
Glucose-Capillary: 33 mg/dL — CL (ref 65–99)

## 2017-12-16 LAB — MAGNESIUM: MAGNESIUM: 1.6 mg/dL — AB (ref 1.7–2.4)

## 2017-12-16 LAB — TROPONIN I: Troponin I: 0.06 ng/mL (ref <0.03)

## 2017-12-16 LAB — HEPARIN LEVEL (UNFRACTIONATED)
HEPARIN UNFRACTIONATED: 1.24 [IU]/mL — AB (ref 0.30–0.70)
Heparin Unfractionated: 1.76 IU/mL — ABNORMAL HIGH (ref 0.30–0.70)

## 2017-12-16 LAB — APTT
APTT: 115 s — AB (ref 24–36)
aPTT: 145 seconds — ABNORMAL HIGH (ref 24–36)

## 2017-12-16 LAB — COOXEMETRY PANEL
CARBOXYHEMOGLOBIN: 2.1 % — AB (ref 0.5–1.5)
METHEMOGLOBIN: 0.8 % (ref 0.0–1.5)
O2 SAT: 64.9 %
TOTAL HEMOGLOBIN: 8.6 g/dL — AB (ref 12.0–16.0)

## 2017-12-16 LAB — PHOSPHORUS: PHOSPHORUS: 4.5 mg/dL (ref 2.5–4.6)

## 2017-12-16 LAB — BRAIN NATRIURETIC PEPTIDE: B NATRIURETIC PEPTIDE 5: 1012.3 pg/mL — AB (ref 0.0–100.0)

## 2017-12-16 LAB — PROCALCITONIN: Procalcitonin: 0.44 ng/mL

## 2017-12-16 MED ORDER — MAGNESIUM SULFATE 2 GM/50ML IV SOLN
2.0000 g | Freq: Once | INTRAVENOUS | Status: AC
  Administered 2017-12-16: 2 g via INTRAVENOUS
  Filled 2017-12-16: qty 50

## 2017-12-16 MED ORDER — POTASSIUM CHLORIDE 10 MEQ/50ML IV SOLN
10.0000 meq | INTRAVENOUS | Status: AC
  Administered 2017-12-16 (×2): 10 meq via INTRAVENOUS
  Filled 2017-12-16 (×2): qty 50

## 2017-12-16 MED ORDER — ACETAMINOPHEN 325 MG PO TABS
650.0000 mg | ORAL_TABLET | Freq: Four times a day (QID) | ORAL | Status: DC | PRN
  Administered 2017-12-16 – 2017-12-22 (×5): 650 mg via ORAL
  Filled 2017-12-16 (×6): qty 2

## 2017-12-16 MED ORDER — NOREPINEPHRINE BITARTRATE 1 MG/ML IV SOLN
0.0000 ug/min | INTRAVENOUS | Status: DC
  Administered 2017-12-16: 2 ug/min via INTRAVENOUS
  Filled 2017-12-16: qty 16

## 2017-12-16 MED ORDER — AMIODARONE HCL 200 MG PO TABS
200.0000 mg | ORAL_TABLET | Freq: Every day | ORAL | Status: DC
  Administered 2017-12-16 – 2017-12-23 (×8): 200 mg via ORAL
  Filled 2017-12-16 (×8): qty 1

## 2017-12-16 MED ORDER — POTASSIUM CHLORIDE 10 MEQ/50ML IV SOLN
10.0000 meq | INTRAVENOUS | Status: AC
  Administered 2017-12-16 (×3): 10 meq via INTRAVENOUS
  Filled 2017-12-16 (×3): qty 50

## 2017-12-16 MED ORDER — DEXTROSE 10 % IV SOLN
INTRAVENOUS | Status: DC
  Administered 2017-12-16: 06:00:00 via INTRAVENOUS

## 2017-12-16 MED ORDER — ORAL CARE MOUTH RINSE: 15.0000 mL | Freq: Two times a day (BID) | OROMUCOSAL | Status: DC

## 2017-12-16 MED ORDER — HEPARIN (PORCINE) IN NACL 100-0.45 UNIT/ML-% IJ SOLN
1050.0000 [IU]/h | INTRAMUSCULAR | Status: DC
  Filled 2017-12-16: qty 250

## 2017-12-16 MED ORDER — POTASSIUM CHLORIDE 10 MEQ/50ML IV SOLN: 10.0000 meq | INTRAVENOUS | Status: DC

## 2017-12-16 MED ORDER — HEPARIN (PORCINE) IN NACL 100-0.45 UNIT/ML-% IJ SOLN
900.0000 [IU]/h | INTRAMUSCULAR | Status: DC
  Administered 2017-12-16: 900 [IU]/h via INTRAVENOUS
  Administered 2017-12-17 – 2017-12-19 (×2): 800 [IU]/h via INTRAVENOUS
  Filled 2017-12-16 (×2): qty 250

## 2017-12-16 MED ORDER — PERFLUTREN LIPID MICROSPHERE
1.0000 mL | INTRAVENOUS | Status: AC | PRN
  Administered 2017-12-16: 2 mL via INTRAVENOUS
  Filled 2017-12-16: qty 10

## 2017-12-16 MED ORDER — MUPIROCIN 2 % EX OINT
1.0000 "application " | TOPICAL_OINTMENT | Freq: Two times a day (BID) | CUTANEOUS | Status: AC
  Administered 2017-12-16 – 2017-12-20 (×10): 1 via NASAL
  Filled 2017-12-16 (×2): qty 22

## 2017-12-16 NOTE — Progress Notes (Signed)
eLink Physician-Brief Progress Note Patient Name: [name] DOB: 1935/06/15 MRN: [id number]   Date of Service  12/16/2017  HPI/Events of Note  Hyperkalemic on admission but now hypokalemic  eICU Interventions  kcl 20 meq iv x i        Migdalia Dk 12/16/2017, 5:50 AM

## 2017-12-16 NOTE — Progress Notes (Signed)
Inpatient Diabetes Program Recommendations  AACE/ADA: New Consensus Statement on Inpatient Glycemic Control (2015)  Target Ranges:  Prepandial:   less than 140 mg/dL      Peak postprandial:   less than 180 mg/dL (1-2 hours)      Critically ill patients:  140 - 180 mg/dL   Lab Results  Component Value Date   GLUCAP 212 (H) 12/16/2017   HGBA1C 7.1 (H) 04/02/2017    Review of Glycemic Control  Diabetes history: DM2 Outpatient Diabetes medications: V-Go 30 Insulin Pump - Mx 66 units/day of Humulin R Current orders for Inpatient glycemic control: Novolog 0-15 units Q4H  HgbA1C - 7.1% on 04/02/2017 Palliative care consulted for goals of care.Had severe hypoglycemia on 5/2.  Inpatient Diabetes Program Recommendations:     Agree with orders. If FBS > 180 mg/dL, add small amount basal insulin - Levemir 10 units QHS  Will continue to follow.  Thank you. Ailene Ards, RD, LDN, CDE Inpatient Diabetes Coordinator [phone]

## 2017-12-16 NOTE — Consult Note (Signed)
Consultation Note Date: 12/16/2017   Patient Name: [name]  DOB: May 13, 1935  MRN: [id number]  Age / Sex: 83 y.o., male  PCP: Myer Peer, MD Referring Physician: Aldean Jewett, MD  Reason for Consultation: Establishing goals of care  HPI/Patient Profile: 83 y.o. male  with past medical history of ICM with EF 20-25% s/p AICD, CAD s/p CABG, HTN, RA, CKD III, who has multiple recent hospitalizations for CHF and is now readmitted on 12/24/2017 with cardiogenic shock requiring pressors. Palliative care was consulted to help address goals.   Clinical Assessment and Goals of Care: Patient is currently in CCU on pressors. He is comfortable appearing without any distressing symptoms. He is confused and unable to have detailed conversations about his health care needs.   Patient is familiar with me from his hospitalization here in January 2019. At that time, patient was living at home with his granddaughter and was severely limited by fatigue and exertional dyspnea. He is essentially chairbound at baseline and used a motorized scooter. Patient was followed by home health and was able to participate in his own care. It is unclear to me if patient returned to his previous functional baseline. We were able to complete advance directives naming patient's son, [name], as his HCPOA. Patient defers medical decision making to his son. Note, that we had previously discussed hospice with patient/family but they opted not to pursue that.   I tried calling patient's son today and did not answer. Will continue to try to reach family to clarify goals.    SUMMARY OF RECOMMENDATIONS   1. Will try to coordinate family meeting 2. Continue supportive care     Primary Diagnoses: Present on Admission: . Hyperkalemia   I have reviewed the medical record, interviewed the patient and family, and examined the patient. The  following aspects are pertinent.  Past Medical History:  Diagnosis Date  . AICD (automatic cardioverter/defibrillator) present    DOI 2008;   . Arthritis    "mostly in my knees" (07/20/2017)  . Congestive heart failure, unspecified   . Coronary atherosclerosis of unspecified type of vessel, native or graft   . Hypothyroidism   . Ischemic cardiomyopathy   . Myocardial infarction [facility]) ?2005  . Other and unspecified hyperlipidemia   . Peptic ulcer, unspecified site, unspecified as acute or chronic, without mention of hemorrhage, perforation, or obstruction   . RA (rheumatoid arthritis) ([place])   . Type II diabetes mellitus ([place])   . Unspecified essential hypertension   . Ventricular tachycardia ([place])    Rx via ICD 5 /2012   Social History   Socioeconomic History  . Marital status: Widowed    Spouse name: Not on file  . Number of children: Not on file  . Years of education: Not on file  . Highest education level: Not on file  Occupational History  . Not on file  Social Needs  . Financial resource strain: Not on file  . Food insecurity:    Worry: Not on file  Inability: Not on file  . Transportation needs:    Medical: Not on file    Non-medical: Not on file  Tobacco Use  . Smoking status: Never Smoker  . Smokeless tobacco: Never Used  Substance and Sexual Activity  . Alcohol use: No  . Drug use: No  . Sexual activity: Not on file  Lifestyle  . Physical activity:    Days per week: Not on file    Minutes per session: Not on file  . Stress: Not on file  Relationships  . Social connections:    Talks on phone: Not on file    Gets together: Not on file    Attends religious service: Not on file    Active member of club or organization: Not on file    Attends meetings of clubs or organizations: Not on file    Relationship status: Not on file  Other Topics Concern  . Not on file  Social History Narrative   Married. Has 1 surviving child and 4 surviving grandchildren.      Family History  Problem Relation Age of Onset  . Coronary artery disease Sister   . Coronary artery disease Brother    Scheduled Meds: . amiodarone  200 mg Oral Daily  . aspirin  81 mg Oral Daily  . chlorhexidine  15 mL Mouth Rinse BID  . Chlorhexidine Gluconate Cloth  6 each Topical Daily  . insulin aspart  0-15 Units Subcutaneous Q4H  . levothyroxine  75 mcg Oral QAC breakfast  . mouth rinse  15 mL Mouth Rinse q12n4p  . mupirocin ointment  1 application Nasal BID  . sodium chloride flush  10-40 mL Intracatheter Q12H   Continuous Infusions: . sodium chloride    . dextrose 50 mL/hr at 12/16/17 1100  . DOBUTamine 5 mcg/kg/min (12/16/17 1100)  . furosemide (LASIX) infusion 6 mg/hr (12/16/17 1100)  . heparin 1,050 Units/hr (12/16/17 1100)  . norepinephrine (LEVOPHED) Adult infusion Stopped (12/16/17 1225)   PRN Meds:.sodium chloride, sodium chloride flush Medications Prior to Admission:  Prior to Admission medications   Medication Sig Start Date End Date Taking? Authorizing Provider  acetaminophen (TYLENOL) 325 MG tablet Take 650 mg by mouth every 6 (six) hours as needed for mild pain or moderate pain.     [provider]  amiodarone (PACERONE) 200 MG tablet Take 1 tablet (200 mg total) by mouth daily. 07/29/17   Lelon Perla, MD  apixaban (ELIQUIS) 2.5 MG TABS tablet Take 1 tablet (2.5 mg total) 2 (two) times daily by mouth. 06/20/17 11/11/17  Larey Dresser, MD  aspirin 81 MG chewable tablet Chew by mouth daily.    [provider]  atorvastatin (LIPITOR) 10 MG tablet Take 10 mg by mouth at bedtime.     [provider]  carvedilol (COREG) 3.125 MG tablet Take 1 tablet (3.125 mg total) by mouth 2 (two) times daily with a meal. 11/04/17   Larey Dresser, MD  digoxin (LANOXIN) 0.125 MG tablet Take 0.5 tablets (0.0625 mg total) by mouth every other day. 06/09/17   Larey Dresser, MD  esomeprazole (NEXIUM) 40 MG capsule Take 40 mg by mouth daily.       [provider]  glyBURIDE (DIABETA) 5 MG tablet Take 5 mg by mouth daily with breakfast.    [provider]  HUMULIN R 100 UNIT/ML injection USE VIA PUMP AS DIRECTED WITH V-GO MACHINE AS DIRECTED. MAX DD OF 66 UNITS 07/04/17   [provider]  hydrocortisone 2.5 % cream Apply 1 application topically daily as needed. Apply to external ear canals, only as needed for itching     [provider]  Insulin Disposable Pump (V-GO 30) KIT daily.  07/04/17   [provider]  levothyroxine (SYNTHROID, LEVOTHROID) 75 MCG tablet Take 1 tablet (75 mcg total) by mouth daily before breakfast. 10/14/17   Clegg, Amy D, NP  metolazone (ZAROXOLYN) 2.5 MG tablet Take 1 tablet (2.5 mg total) by mouth once a week. Take every Saturday with extra 20 meq of Potassium and as prescribed 12/08/17 03/08/18  Larey Dresser, MD  polyethylene glycol [facility] Floria Raveling) packet Take 17 g by mouth daily as needed for mild constipation.     [provider]  potassium chloride SA (K-DUR,KLOR-CON) 20 MEQ tablet Take 2 tablets (40 mEq total) by mouth 2 (two) times daily. 12/12/17   Shirley Friar, PA-C  spironolactone (ALDACTONE) 25 MG tablet Take 0.5 tablets (12.5 mg total) by mouth daily. 08/28/17   Daune Perch, NP  torsemide (DEMADEX) 100 MG tablet Take 1 tablet (100 mg total) by mouth 2 (two) times daily. 05/02/17   Minus Breeding, MD  vitamin C (ASCORBIC ACID) 500 MG tablet Take 500 mg by mouth 2 (two) times daily.    [provider]   Allergies  Allergen Reactions  . Metformin And Related Other (See Comments)    "Made sugar go wild"   . Rivastigmine Nausea And Vomiting and Other (See Comments)    NO EXELON PATCHES!!  . Penicillins Rash   Review of Systems  Unable to perform ROS   Physical Exam  Constitutional:  Frail appearing in ICU  Cardiovascular: Normal rate and regular rhythm.  Pulmonary/Chest: Effort normal and breath sounds normal.   Abdominal: Soft. Bowel sounds are normal.  Neurological: He is alert.  Oriented x 1  Skin: Skin is warm and dry.    Vital Signs: BP 103/68   Pulse 81   Temp (!) 97.3 F (36.3 C) (Oral)   Resp 17   Ht '5\' 10"'$  (1.778 m)   Wt 87 kg (191 lb 12.8 oz)   SpO2 99%   BMI 27.52 kg/m  Pain Scale: 0-10   Pain Score: 0-No pain   SpO2: SpO2: 99 % O2 Device:SpO2: 99 % O2 Flow Rate: .   IO: Intake/output summary:   Intake/Output Summary (Last 24 hours) at 12/16/2017 1243 Last data filed at 12/16/2017 1200 Gross per 24 hour  Intake 1633.77 ml  Output 2800 ml  Net -1166.23 ml    LBM: Last BM Date: 12/28/2017 Baseline Weight: Weight: 89.6 kg (197 lb 8.5 oz) Most recent weight: Weight: 87 kg (191 lb 12.8 oz)     Palliative Assessment/Data:   Flowsheet Rows     Most Recent Value  Intake Tab  Referral Department  Hospitalist  Unit at Time of Referral  ICU  Palliative Care Primary Diagnosis  Cardiac  Date Notified  12/16/17  Palliative Care Type  Return patient Palliative Care  Reason for referral  Clarify Goals of Care  Date of Admission  12/16/17  Date first seen by Palliative Care  12/16/17  # of days Palliative referral response time  0 Day(s)  # of days IP prior to Palliative referral  0  Clinical Assessment  Palliative Performance Scale Score  30%  Psychosocial & Spiritual Assessment  Palliative Care Outcomes  Patient/Family meeting held?  No      Time In: 1230 Time Out: 1300 Time  Total: 30 minutes Greater than 50%  of this time was spent counseling and coordinating care related to the above assessment and plan.  Signed by: Irean Hong, NP   Please contact Palliative Medicine Team phone at [phone] for questions and concerns.  For individual provider: See Shea Evans

## 2017-12-16 NOTE — Progress Notes (Signed)
Poison control called and updated on pt status

## 2017-12-16 NOTE — Progress Notes (Signed)
CRITICAL VALUE ALERT  Critical Value:  Glucose 32 (Lab)  Date & Time Notied:  12/16/17 @ 0530  Provider Notified: MD already aware  Orders Received/Actions taken: D10 IV @ 50 cc/hr

## 2017-12-16 NOTE — Progress Notes (Signed)
ANTICOAGULATION CONSULT NOTE - Initial Consult  Pharmacy Consult for Heparin (Eliquis on hold) Indication: atrial fibrillation  Allergies  Allergen Reactions  . Metformin And Related Other (See Comments)    "Made sugar go wild"   . Rivastigmine Nausea And Vomiting and Other (See Comments)    NO EXELON PATCHES!!  . Penicillins Rash    Patient Measurements: Height: 5\' 10"  (177.8 cm) Weight: 191 lb 12.8 oz (87 kg) IBW/kg (Calculated) : 73 Heparin Dosing Weight: 89.6 kg  Vital Signs: Temp: 97.9 F (36.6 C) (05/03 1553) Temp Source: Oral (05/03 1553) BP: 105/62 (05/03 1630) Pulse Rate: 79 (05/03 1630)  Labs: Recent Labs    12/14/2017 1601 12/16/17 0345 12/16/17 1356 12/16/17 1515  HGB 9.2* 8.3*  --   --   HCT 29.1* 25.8*  --   --   PLT 179 167  --   --   APTT  --  145*  --  115*  HEPARINUNFRC  --  1.76*  --  1.24*  CREATININE 2.73* 2.61* 2.49*  --   TROPONINI 0.06* 0.06*  --   --     Estimated Creatinine Clearance: 24.4 mL/min (A) (by C-G formula based on SCr of 2.49 mg/dL (H)).   Assessment:  83 yr old male to begin IV heparin for atrial fibrillation.  Eliquis PTA with last dose reported ~8am 5/2. Transferred to [facility] from [facility] with cardiogenic shock vs cardiorenal syndrome.    Hep lvl 1.24, aptt 115 - pretty close Can stop monitoring aptt  No issues with bleeding per RN     Goal of Therapy:  Heparin level 0.3-0.7 units/ml aPTT 66-102 seconds Monitor platelets by anticoagulation protocol: Yes   Plan:  Hold heparin infusion ~1 hour Reduce heparin drip to 90 units/hr (resume @ 1800) Next lvl 0200 Daily hep lvl cbc  [facility], PharmD, BCPS, BCCCP Clinical Pharmacist Clinical phone for 12/16/2017 from 1430 - 2300: [birth year]1981 If after 2300, please call main pharmacy at: [phone] 12/16/2017 4:56 PM

## 2017-12-16 NOTE — Progress Notes (Signed)
eLink Physician-Brief Progress Note Patient Name: [name] DOB: 18-Mar-1935 MRN: [id number]   Date of Service  12/16/2017  HPI/Events of Note  Recurrent hypoglycemia  eICU Interventions  D 10 % infusion at 50 ml/ hr x next 24 hours        Okoronkwo U Ogan 12/16/2017, 5:28 AM

## 2017-12-16 NOTE — Progress Notes (Addendum)
Advanced Heart Failure Rounding Note  PCP-Cardiologist: Olga Millers, MD   Subjective:    Admitted with volume overload. He was in cardiogenic shock.   Started on milrinone but later switched to dobutamine due to hypotension. Norepi later added. CO-OX 44-->65%.   Diuresing with lasix drip. Weight down 6 pounds. CVP 7-8    Pleasantly confused. Denies SOB   Objective:   Weight Range: 191 lb 12.8 oz (87 kg) Body mass index is 27.52 kg/m.   Vital Signs:   Temp:  [97.6 F (36.4 C)-98.1 F (36.7 C)] 98.1 F (36.7 C) (05/02 2330) Pulse Rate:  [68-83] 71 (05/03 0700) Resp:  [10-23] 10 (05/03 0700) BP: (73-155)/(41-106) 106/54 (05/03 0700) SpO2:  [93 %-100 %] 99 % (05/03 0700) Weight:  [191 lb 12.8 oz (87 kg)-197 lb 8.5 oz (89.6 kg)] 191 lb 12.8 oz (87 kg) (05/03 0600) Last BM Date: 2018/01/09  Weight change: Filed Weights   09-Jan-2018 1600 12/16/17 0600  Weight: 197 lb 8.5 oz (89.6 kg) 191 lb 12.8 oz (87 kg)    Intake/Output:   Intake/Output Summary (Last 24 hours) at 12/16/2017 0713 Last data filed at 12/16/2017 0630 Gross per 24 hour  Intake 580.72 ml  Output 1900 ml  Net -1319.28 ml      Physical Exam   CVP 7-8  General:  Elderly in bed.  No resp difficulty HEENT: Normal Neck: Supple. JVP to jaw . Carotids 2+ bilat; no bruits. No lymphadenopathy or thyromegaly appreciated. Cor: PMI nondisplaced. Regular rate & rhythm. No rubs, gallops or murmurs. R subclavian central line.  Lungs: Decreased in the bases. On room air.  Abdomen: Soft, nontender, nondistended. No hepatosplenomegaly. No bruits or masses. Good bowel sounds. Extremities: No cyanosis, clubbing, rash, R and LLE 2+ edema Neuro: Alert to self. Cranial nerves grossly intact. moves all 4 extremities w/o difficulty.   Telemetry   Sinus Rhythm with PVCs 70s  EKG    N/A   Labs    CBC Recent Labs    09-Jan-2018 1601 12/16/17 0345  WBC 8.8 6.6  HGB 9.2* 8.3*  HCT 29.1* 25.8*  MCV 80.8 80.4  PLT  179 167   Basic Metabolic Panel Recent Labs    98/92/11 1601 12/16/17 0345  NA 127* 130*  K 5.9* 3.0*  CL 89* 89*  CO2 25 28  GLUCOSE 166* 32*  BUN 55* 52*  CREATININE 2.73* 2.61*  CALCIUM 9.2 8.5*  MG 1.9 1.6*  PHOS 4.4 4.5   Liver Function Tests Recent Labs    01/09/2018 1601 12/16/17 0345  AST 1,094* 1,074*  ALT 864* 875*  ALKPHOS 245* 195*  BILITOT 2.3* 1.7*  PROT 6.5 6.0*  ALBUMIN 3.2* 3.0*   No results for input(s): LIPASE, AMYLASE in the last 72 hours. Cardiac Enzymes Recent Labs    01/09/2018 1601 12/16/17 0345  TROPONINI 0.06* 0.06*    BNP: BNP (last 3 results) Recent Labs    07/19/17 1819 08/25/17 1140 09/09/17 0525  BNP 926.3* 1,102.9* 1,105.4*    ProBNP (last 3 results) No results for input(s): PROBNP in the last 8760 hours.   D-Dimer No results for input(s): DDIMER in the last 72 hours. Hemoglobin A1C No results for input(s): HGBA1C in the last 72 hours. Fasting Lipid Panel No results for input(s): CHOL, HDL, LDLCALC, TRIG, CHOLHDL, LDLDIRECT in the last 72 hours. Thyroid Function Tests No results for input(s): TSH, T4TOTAL, T3FREE, THYROIDAB in the last 72 hours.  Invalid input(s): FREET3  Other results:  Imaging    Dg Chest Port 1 View  Result Date: 01/10/2018 CLINICAL DATA:  Patient with central line placement EXAM: PORTABLE CHEST 1 VIEW COMPARISON:  Chest radiograph 12/14/2017 FINDINGS: Single lead AICD device overlies the left hemithorax, lead is stable in position. Interval insertion right IJ central venous catheter tip projecting over the superior vena cava. Stable cardiomegaly status post median sternotomy. Grossly unchanged left greater than right mid lower lung pulmonary consolidation. Left pleural effusion not excluded. IMPRESSION: New right IJ central venous catheter tip projecting over the superior vena cava. Re demonstrated bilateral perihilar interstitial opacities which may represent edema. Left greater than right mid  and lower lung heterogeneous opacities may represent atelectasis or infection. Electronically Signed   By: Annia Belt M.D.   On: 12/28/2017 18:40   Dg Chest Port 1 View  Result Date: 01/12/2018 CLINICAL DATA:  Acute on chronic respiratory failure EXAM: PORTABLE CHEST 1 VIEW COMPARISON:  Chest radiograph 01/05/2018 FINDINGS: Single lead AICD device overlies the left hemithorax, leads stable in position. Stable cardiomegaly. Low lung volumes. Grossly unchanged mid lower lung heterogeneous opacities. Bilateral interstitial opacities. Persistent retrocardiac consolidation. IMPRESSION: Perihilar interstitial opacities may represent mild edema. Similar mid and lower lung heterogeneous opacities left greater than right which may represent atelectasis. Infection not excluded. Electronically Signed   By: Annia Belt M.D.   On: 01/04/2018 17:56      Medications:     Scheduled Medications: . aspirin  81 mg Oral Daily  . chlorhexidine  15 mL Mouth Rinse BID  . Chlorhexidine Gluconate Cloth  6 each Topical Daily  . insulin aspart  0-15 Units Subcutaneous Q4H  . levothyroxine  75 mcg Oral QAC breakfast  . mouth rinse  15 mL Mouth Rinse q12n4p  . mupirocin ointment  1 application Nasal BID  . sodium chloride flush  10-40 mL Intracatheter Q12H     Infusions: . sodium chloride    . dextrose 50 mL/hr at 12/16/17 0600  . DOBUTamine 5 mcg/kg/min (12/16/17 0600)  . furosemide (LASIX) infusion 8 mg/hr (12/16/17 0600)  . heparin 1,050 Units/hr (12/16/17 0620)  . norepinephrine (LEVOPHED) Adult infusion 2 mcg/min (12/16/17 0600)  . potassium chloride 10 mEq (12/16/17 0630)     PRN Medications:  sodium chloride, sodium chloride flush    Patient Profile   Raymond Uecker Jonesis a 82 y.o.malewith CAD s/p CABG, hypertension, hyperlipidemia and chronic systolic and diastolic heart failure presenting today with sob at rest and increased leg edema.    Assessment/Plan  1. Acute on chronic systolic heart  failure -> cardiogenic shock - ECHO 12/18: EF 20-25%  - He is end-stage with shock and multi-system organ failure.  - Milrinone started but became hypotensive now on dobutamine 5 mcg + norepi 2 mcg.  Can titrate or add NE for BP support as needed - Hold home HF meds with shock AKI/ - CVP 7-8. Continue lasix drip.  - Given end-stage HF, multi-system organ failure, poor QOL and dementia suspect Palliative Care is best option. [name] to see in am.   2. Acute on chronic renal failure, now stage IV - Due to cardiorenal/shock. On inotropes - Follow renal function - Likely not HD candidate. As above, Palliative care likely only option   3. Shock liver -AST 1074 ALT 875  - support with inotropes - off statin. Off amio  4. Hyperkalemia - resolved.  - not candidate for emergent CVVHD  5. PAF - NSR . Continue eliquis 2.5 mg twice a day -  off amio with shock liver.   6. CAD  -s/p CABG. Coronary angiography 1/18 with patent graft and no targets for revascularization. - No s/s ischemia  - Off statin with shock liver   7.Hyponatremia - 130 today.  8. Code status - DNR/DNI. Ok with Bipap.  - Palliative Consult in am   9. Hypokalemia Carefully replace potassium. BMET at 1200.   Consult Palliative Care.   Medication concerns reviewed with patient and pharmacy team. Barriers identified: ongoing medications issues.   Length of Stay: 1  Amy Clegg, NP  12/16/2017, 7:13 AM  Advanced Heart Failure Team Pager [phone] (M-F; 7a - 4p)  Please contact [facility] for night-coverage after hours (4p -7a ) and weekends on [email or web address]  Patient seen with NP, agree with the above note.  Patient is confused this morning. Afebrile, WBCs normal.  CVP 8, co-ox 65%.  He is on dobutamine 5 and norepinephrine 2.    On exam, JVP 8 cm, regular S1S2, 2+ edema to knees with ted hose on.   1. Acute on chronic systolic CHF with cardiogenic shock: EF 20-25% in 12/18, ischemic  cardiomyopathy.  Co-ox up to 65% today, creatinine stable at 2.6.  - Continue dobutamine 5 - Wean off norepinephrine today as able.  - Decrease Lasix to 6 mg/hr with CVP coming down, probably to boluses tomorrow.  2. CAD: s/p CABG.  Mild TnI elevation likely demand ischemia.  Coronary angiography 1/18 with patent graft and no targets for revascularization. - Statin held for now with shock liver.  - Getting ASA 81 daily.  3. Atrial fibrillation: Paroxysmal.  He is in NSR today.  - Can continue amiodarone 200 daily to keep in NSR, elevated LFTs from shock liver.  - Currently on heparin gtt, continue for now but can restart Eliquis tomorrow most likely.  4. Elevated LFTs: Suspect shock liver. Support cardiac output. CMET daily.  5. Hyponatremia: Fluid restrict.  6. Delirium: Likely related to medical illness.  7. AKI: Suspect cardiorenal with cardiogenic shock.  Stable this morning.  Baseline creatinine 2.0.  8. Code status: DNR/DNI.  Agree with palliative care conversation.   CRITICAL CARE Performed by: Marca Ancona  Total critical care time: 35 minutes  Critical care time was exclusive of separately billable procedures and treating other patients.  Critical care was necessary to treat or prevent imminent or life-threatening deterioration.  Critical care was time spent personally by me on the following activities: development of treatment plan with patient and/or surrogate as well as nursing, discussions with consultants, evaluation of patient's response to treatment, examination of patient, obtaining history from patient or surrogate, ordering and performing treatments and interventions, ordering and review of laboratory studies, ordering and review of radiographic studies, pulse oximetry and re-evaluation of patient's condition.  Marca Ancona 12/16/2017 7:54 AM

## 2017-12-16 NOTE — Progress Notes (Signed)
Hypoglycemic Event  CBG: 33  Treatment: D50 IV 50 mL  Symptoms: Hungry and Nervous/irritable  Follow-up CBG: Time: 0421 CBG Result: 124  Possible Reasons for Event: Inadequate meal intake  Comments/MD notified: eLink notified    [name]

## 2017-12-16 NOTE — Progress Notes (Signed)
ANTICOAGULATION CONSULT NOTE - Initial Consult  Pharmacy Consult for Heparin (Eliquis on hold) Indication: atrial fibrillation  Allergies  Allergen Reactions  . Metformin And Related Other (See Comments)    "Made sugar go wild"   . Rivastigmine Nausea And Vomiting and Other (See Comments)    NO EXELON PATCHES!!  . Penicillins Rash    Patient Measurements: Height: 5\' 10"  (177.8 cm) Weight: 197 lb 8.5 oz (89.6 kg) IBW/kg (Calculated) : 73 Heparin Dosing Weight: 89.6 kg  Vital Signs: Temp: 98.1 F (36.7 C) (05/02 2330) Temp Source: Oral (05/02 2330) BP: 91/55 (05/03 0500) Pulse Rate: 79 (05/03 0500)  Labs: Recent Labs    24-Dec-2017 1601 12/16/17 0345  HGB 9.2* 8.3*  HCT 29.1* 25.8*  PLT 179 167  APTT  --  145*  HEPARINUNFRC  --  1.76*  CREATININE 2.73*  --   TROPONINI 0.06*  --     Estimated Creatinine Clearance: 24.3 mL/min (A) (by C-G formula based on SCr of 2.73 mg/dL (H)).   Medical History: Past Medical History:  Diagnosis Date  . AICD (automatic cardioverter/defibrillator) present    DOI 2008;   . Arthritis    "mostly in my knees" (07/20/2017)  . Congestive heart failure, unspecified   . Coronary atherosclerosis of unspecified type of vessel, native or graft   . Hypothyroidism   . Ischemic cardiomyopathy   . Myocardial infarction [facility]) ?2005  . Other and unspecified hyperlipidemia   . Peptic ulcer, unspecified site, unspecified as acute or chronic, without mention of hemorrhage, perforation, or obstruction   . RA (rheumatoid arthritis) (HCC)   . Type II diabetes mellitus (HCC)   . Unspecified essential hypertension   . Ventricular tachycardia (HCC)    Rx via ICD 5 /2012   Assessment:  83 yr old male to begin IV heparin for atrial fibrillation.  Eliquis PTA with last dose reported ~8am 5/2. Transferred to [facility] from [facility] with cardiogenic shock vs cardiorenal syndrome.  SGOT 1011, SGPT 629 and total bilirubin 2.1 at [place]. Slight trend up  with labs at [facility] tonight.  Creatinine 2.5 at [facility], 2.73 at [facility].  Expect heparin levels to be falsely elevated due to recent Eliquis doses, so will use aPTTs for heparin monitoring until aPTTs correlate with heparin levels.  aPTT: 145; No overt bleeding per RN; will hold infusion for ~1 hour and resume at lower rate.      Goal of Therapy:  Heparin level 0.3-0.7 units/ml aPTT 66-102 seconds Monitor platelets by anticoagulation protocol: Yes   Plan:  Hold heparin infusion ~1 hour Reduce heparin drip to 1050 units/hr. Heparin level and aPTT ~8 hrs after drip begins. Daily aPTT, heparin level and CBC. Eliquis on hold.  [facility], PharmD Clinical Pharmacist 12/16/2017 5:24 AM

## 2017-12-16 NOTE — H&P (Signed)
PULMONARY / CRITICAL CARE MEDICINE   Name: [name] MRN: [id number] DOB: 08/31/1934    ADMISSION DATE:  12/16/2017  REFERRING MD:  [facility])   Interval Hx Patient is feeling much better. He is now on dobutamine and levophed drips. Off milrinone. Still on lasix drip and diuresing well.   CHIEF COMPLAINT: Cardiogenic shock   HISTORY OF PRESENT ILLNESS:   83 year old male with history of CAD status post CABG (2000), hypertension, ischemic cardiomyopathy (EF 20 to 25% 07/20/2017), rheumatoid arthritis, stage III CKD, PAF on Eliquis, diabetes, chronic combined heart failure followed in heart failure clinic. With five admissions in last 10 months related to acute on chronic heart failure.   Presents to [facility] ED on 5/2 with progressive weakness and leg swelling. Upon arrival Patient was noted to be bradycardiac with multiple PVCs, and K of 7.4. Crt 2.5. AST/ALT 1011/629. Treated with Bicarb/Insulin/Dextrose and transferred to [facility] for further management.   Patient states that on 5/1 he had a fall while in was transferring from chair to wheelchair. At baseline he is wheelchair bound due to swelling and weakness in lower extremities. Voices that his son help with his medical decisions and he lives with grand-daughter and her children.   PAST MEDICAL HISTORY :  He  has a past medical history of AICD (automatic cardioverter/defibrillator) present, Arthritis, Congestive heart failure, unspecified, Coronary atherosclerosis of unspecified type of vessel, native or graft, Hypothyroidism, Ischemic cardiomyopathy, Myocardial infarction ([place]) (?2005), Other and unspecified hyperlipidemia, Peptic ulcer, unspecified site, unspecified as acute or chronic, without mention of hemorrhage, perforation, or obstruction, RA (rheumatoid arthritis) ([place]), Type II diabetes mellitus ([place]), Unspecified essential hypertension, and Ventricular tachycardia ([place]).  PAST SURGICAL HISTORY: He  has a past surgical  history that includes implantable cardioverter defibrillator (icd) generator change (N/A, 01/25/2014); Cardiac catheterization (N/A, 09/01/2016); Carpal tunnel release (Left, 05/2008); Carpal tunnel release (Right, 03/2010); Inguinal hernia repair (Right); and Coronary artery bypass graft (2000).  Allergies  Allergen Reactions  . Metformin And Related Other (See Comments)    "Made sugar go wild"   . Rivastigmine Nausea And Vomiting and Other (See Comments)    NO EXELON PATCHES!!  . Penicillins Rash    No current facility-administered medications on file prior to encounter.    Current Outpatient Medications on File Prior to Encounter  Medication Sig  . acetaminophen (TYLENOL) 325 MG tablet Take 650 mg by mouth every 6 (six) hours as needed for mild pain or moderate pain.   Marland Kitchen amiodarone (PACERONE) 200 MG tablet Take 1 tablet (200 mg total) by mouth daily.  Marland Kitchen apixaban (ELIQUIS) 2.5 MG TABS tablet Take 1 tablet (2.5 mg total) 2 (two) times daily by mouth.  Marland Kitchen aspirin 81 MG chewable tablet Chew by mouth daily.  Marland Kitchen atorvastatin (LIPITOR) 10 MG tablet Take 10 mg by mouth at bedtime.   . carvedilol (COREG) 3.125 MG tablet Take 1 tablet (3.125 mg total) by mouth 2 (two) times daily with a meal.  . digoxin (LANOXIN) 0.125 MG tablet Take 0.5 tablets (0.0625 mg total) by mouth every other day.  . esomeprazole (NEXIUM) 40 MG capsule Take 40 mg by mouth daily.    Marland Kitchen glyBURIDE (DIABETA) 5 MG tablet Take 5 mg by mouth daily with breakfast.  . HUMULIN R 100 UNIT/ML injection USE VIA PUMP AS DIRECTED WITH V-GO MACHINE AS DIRECTED. MAX DD OF 66 UNITS  . hydrocortisone 2.5 % cream Apply 1 application topically daily as needed. Apply to external ear canals, only  as needed for itching   . Insulin Disposable Pump (V-GO 30) KIT daily.   Marland Kitchen levothyroxine (SYNTHROID, LEVOTHROID) 75 MCG tablet Take 1 tablet (75 mcg total) by mouth daily before breakfast.  . metolazone (ZAROXOLYN) 2.5 MG tablet Take 1 tablet (2.5 mg  total) by mouth once a week. Take every Saturday with extra 20 meq of Potassium and as prescribed  . polyethylene glycol (MIRALAX / GLYCOLAX) packet Take 17 g by mouth daily as needed for mild constipation.   . potassium chloride SA (K-DUR,KLOR-CON) 20 MEQ tablet Take 2 tablets (40 mEq total) by mouth 2 (two) times daily.  Marland Kitchen spironolactone (ALDACTONE) 25 MG tablet Take 0.5 tablets (12.5 mg total) by mouth daily.  Marland Kitchen torsemide (DEMADEX) 100 MG tablet Take 1 tablet (100 mg total) by mouth 2 (two) times daily.  . vitamin C (ASCORBIC ACID) 500 MG tablet Take 500 mg by mouth 2 (two) times daily.    FAMILY HISTORY:  His indicated that his mother is deceased. He indicated that his father is deceased. He indicated that the status of his sister is unknown. He indicated that the status of his brother is unknown. He indicated that his maternal grandmother is deceased. He indicated that his maternal grandfather is deceased. He indicated that his paternal grandmother is deceased. He indicated that his paternal grandfather is deceased.   SOCIAL HISTORY: He  reports that he has never smoked. He has never used smokeless tobacco. He reports that he does not drink alcohol or use drugs.  REVIEW OF SYSTEMS:   All negative; except for those that are bolded, which indicate positives.  Constitutional: weight loss, weight gain, night sweats, fevers, chills, fatigue, weakness.  HEENT: headaches, sore throat, sneezing, nasal congestion, post nasal drip, difficulty swallowing, tooth/dental problems, visual complaints, visual changes, ear aches. Neuro: difficulty with speech, weakness, numbness, ataxia. CV:  chest pain, orthopnea, PND, swelling in lower extremities, dizziness, palpitations, syncope.  Resp: cough, hemoptysis, dyspnea, wheezing. GI: heartburn, indigestion, abdominal pain, nausea, vomiting, diarrhea, constipation, change in bowel habits, loss of appetite, hematemesis, melena, hematochezia.  GU: dysuria,  change in color of urine, urgency or frequency, flank pain, hematuria. MSK: joint pain or swelling, decreased range of motion. Psych: change in mood or affect, depression, anxiety, suicidal ideations, homicidal ideations. Skin: rash, itching, bruising.   SUBJECTIVE:    VITAL SIGNS: BP 103/61   Pulse 79   Temp (!) 97.4 F (36.3 C) (Oral)   Resp 14   Ht _0  (1.778 m)   Wt 87 kg (191 lb 12.8 oz)   SpO2 99%   BMI 27.52 kg/m   HEMODYNAMICS: CVP:  [1 mmHg-16 mmHg] 11 mmHg  VENTILATOR SETTINGS:    INTAKE / OUTPUT: I/O last 3 completed shifts: In: 604.3 [P.O.:240; I.V.:314.3; IV Piggyback:50] Out: 1900 [Urine:1900]  PHYSICAL EXAMINATION: General:  Chronically ill appearing elderly male, no distress  Neuro:  Alert, oriented to self and place, disoriented to situation/time, follows commands  HEENT:  MMM Cardiovascular:  Irregular, no MRG Lungs:  Diminished to bases, no wheeze, non-labored  Abdomen:  Soft, non-tender  Musculoskeletal:  +3 BLE edema  Skin:  Multiple skin abrasions and bruising noted to all extremities   LABS:  BMET Recent Labs  Lab 12/14/2017 1601 12/16/17 0345  NA 127* 130*  K 5.9* 3.0*  CL 89* 89*  CO2 25 28  BUN 55* 52*  CREATININE 2.73* 2.61*  GLUCOSE 166* 32*    Electrolytes Recent Labs  Lab 01/12/2018 1601 12/16/17 0345  CALCIUM 9.2 8.5*  MG 1.9 1.6*  PHOS 4.4 4.5    CBC Recent Labs  Lab 01/09/2018 1601 12/16/17 0345  WBC 8.8 6.6  HGB 9.2* 8.3*  HCT 29.1* 25.8*  PLT 179 167    Coag's Recent Labs  Lab 12/16/17 0345  APTT 145*    Sepsis Markers Recent Labs  Lab 12/25/2017 1601 12/16/17 0345  PROCALCITON 0.24 0.44    ABG No results for input(s): PHART, PCO2ART, PO2ART in the last 168 hours.  Liver Enzymes Recent Labs  Lab 12/30/2017 1601 12/16/17 0345  AST 1,094* 1,074*  ALT 864* 875*  ALKPHOS 245* 195*  BILITOT 2.3* 1.7*  ALBUMIN 3.2* 3.0*    Cardiac Enzymes Recent Labs  Lab 12/29/2017 1601 12/16/17 0345   TROPONINI 0.06* 0.06*    Glucose Recent Labs  Lab 01/12/2018 2001 12/26/2017 2358 12/16/17 0023 12/16/17 0344 12/16/17 0421 12/16/17 0741  GLUCAP 151* 40* 130* 33* 124* 81    Imaging Dg Chest Port 1 View  Result Date: 01/03/2018 CLINICAL DATA:  Patient with central line placement EXAM: PORTABLE CHEST 1 VIEW COMPARISON:  Chest radiograph 01/05/2018 FINDINGS: Single lead AICD device overlies the left hemithorax, lead is stable in position. Interval insertion right IJ central venous catheter tip projecting over the superior vena cava. Stable cardiomegaly status post median sternotomy. Grossly unchanged left greater than right mid lower lung pulmonary consolidation. Left pleural effusion not excluded. IMPRESSION: New right IJ central venous catheter tip projecting over the superior vena cava. Re demonstrated bilateral perihilar interstitial opacities which may represent edema. Left greater than right mid and lower lung heterogeneous opacities may represent atelectasis or infection. Electronically Signed   By: Lovey Newcomer M.D.   On: 12/18/2017 18:40   Dg Chest Port 1 View  Result Date: 01/01/2018 CLINICAL DATA:  Acute on chronic respiratory failure EXAM: PORTABLE CHEST 1 VIEW COMPARISON:  Chest radiograph 01/07/2018 FINDINGS: Single lead AICD device overlies the left hemithorax, leads stable in position. Stable cardiomegaly. Low lung volumes. Grossly unchanged mid lower lung heterogeneous opacities. Bilateral interstitial opacities. Persistent retrocardiac consolidation. IMPRESSION: Perihilar interstitial opacities may represent mild edema. Similar mid and lower lung heterogeneous opacities left greater than right which may represent atelectasis. Infection not excluded. Electronically Signed   By: Lovey Newcomer M.D.   On: 12/30/2017 17:56     STUDIES:  CXR 5/2 >>   CULTURES: None.   ANTIBIOTICS: None.   SIGNIFICANT EVENTS: 5/2 > Arrived to Hospital   LINES/TUBES: PIV   DISCUSSION: 83  year old male in cardiogenic shock presents hyperkalemic and bradycardiac. Transferred from [facility] to [facility] for further management.   ASSESSMENT / PLAN:  PULMONARY A: No issues  P:   Maintain Oxygen Saturation >92 Pulmonary Hygiene  DNI, BiPAP Okay   CARDIOVASCULAR A:  Cardiogenic Shock in setting of decompensated chronic systolic/diastolic HF (EF 82-64) vs Cardiorenal syndrome  H/O CAD s/p CABG, HTN, HLD, PAF on Eliquis  P:  Continue dobutamine and levophed  Cardiac Monitoring  We appreciate Heart Failure service follow up  Continue ASA Heparin gtt (hold eliquis)  Lasix gtt  Levophed for MAP goal >65  Hold Lipitor, Coreg, Dig, Spirolactone, Torsemide  Hold amiodarone in setting of Transaminase  Trend Troponin   RENAL A:   Acute on Chronic Kidney Disease Stage 4 (Baseline Crt 1.7-1.8)  Hyperkalemia resolved  Hypokalemia  Per Family has received Multiple does of K from cardiology office for hypokalemia over last 2 days as well as 2 doses of metolazone,  also takes spironolactone  Hyponatremia  P:   replace potasium and magnesium today  Trend BMP kidney function improving  Lasix gtt as above   GASTROINTESTINAL A:   Transaminase in setting of cardiogenic shock  Shocked liver  P:   Cardiac diet   Fluid restriction  LFT   HEMATOLOGIC A:   Anticoagulation needs in setting of PAF Anemia of Chronic Disease  P:  Trend CBC  Heparin gtt as above   INFECTIOUS A:   No issues  P:   Trend WBC and Fever Curve Trend PCT   ENDOCRINE A:   DM   P:   Trend Glucose  SSI   NEUROLOGIC A:   Memory Loss (Family reports dementia)   Multiple Fall secondary to weakness  P:   Monitor  PT/OT > May need SNF placement, currently leaves with grand-daughter    FAMILY  - Updates: no family available    - Inter-disciplinary family meet or Palliative Care meeting due by: 12/22/2017

## 2017-12-16 NOTE — Progress Notes (Signed)
Hypoglycemic Event  CBG: 40  Treatment: D50 IV 50 mL  Symptoms: Nervous/irritable  Follow-up CBG: Time: 0023 CBG Result:130  Possible Reasons for Event: Inadequate meal intake  Comments/MD notified: No    Dianna Rossetti F

## 2017-12-16 NOTE — Progress Notes (Addendum)
CRITICAL VALUE ALERT  Critical Value:  Troponin 0.06  Date & Time Notied:  12/16/17 @ 0527  Provider Notified: eLink notified  Orders Received/Actions taken: No new orders at this time.

## 2017-12-16 NOTE — Progress Notes (Signed)
  Echocardiogram 2D Echocardiogram with definity has been performed.  Leta Jungling M 12/16/2017, 8:23 AM

## 2017-12-17 ENCOUNTER — Encounter (HOSPITAL_COMMUNITY): Payer: Self-pay | Admitting: *Deleted

## 2017-12-17 ENCOUNTER — Other Ambulatory Visit: Payer: Self-pay

## 2017-12-17 DIAGNOSIS — Z7189 Other specified counseling: Secondary | ICD-10-CM

## 2017-12-17 DIAGNOSIS — J9621 Acute and chronic respiratory failure with hypoxia: Secondary | ICD-10-CM

## 2017-12-17 DIAGNOSIS — Z515 Encounter for palliative care: Secondary | ICD-10-CM

## 2017-12-17 DIAGNOSIS — M25562 Pain in left knee: Secondary | ICD-10-CM

## 2017-12-17 DIAGNOSIS — M25561 Pain in right knee: Secondary | ICD-10-CM

## 2017-12-17 LAB — CBC WITH DIFFERENTIAL/PLATELET
BASOS ABS: 0 10*3/uL (ref 0.0–0.1)
Basophils Relative: 0 %
EOS ABS: 0.1 10*3/uL (ref 0.0–0.7)
EOS PCT: 1 %
HCT: 24.1 % — ABNORMAL LOW (ref 39.0–52.0)
HEMOGLOBIN: 7.8 g/dL — AB (ref 13.0–17.0)
Lymphocytes Relative: 11 %
Lymphs Abs: 0.6 10*3/uL — ABNORMAL LOW (ref 0.7–4.0)
MCH: 26 pg (ref 26.0–34.0)
MCHC: 32.4 g/dL (ref 30.0–36.0)
MCV: 80.3 fL (ref 78.0–100.0)
Monocytes Absolute: 0.5 10*3/uL (ref 0.1–1.0)
Monocytes Relative: 9 %
NEUTROS PCT: 79 %
Neutro Abs: 4.3 10*3/uL (ref 1.7–7.7)
PLATELETS: 160 10*3/uL (ref 150–400)
RBC: 3 MIL/uL — AB (ref 4.22–5.81)
RDW: 19.4 % — ABNORMAL HIGH (ref 11.5–15.5)
WBC: 5.4 10*3/uL (ref 4.0–10.5)

## 2017-12-17 LAB — COOXEMETRY PANEL
CARBOXYHEMOGLOBIN: 1.8 % — AB (ref 0.5–1.5)
CARBOXYHEMOGLOBIN: 1.8 % — AB (ref 0.5–1.5)
METHEMOGLOBIN: 0.8 % (ref 0.0–1.5)
METHEMOGLOBIN: 1.6 % — AB (ref 0.0–1.5)
O2 SAT: 60.1 %
O2 Saturation: 51.3 %
TOTAL HEMOGLOBIN: 7.9 g/dL — AB (ref 12.0–16.0)
TOTAL HEMOGLOBIN: 6.8 g/dL — AB (ref 12.0–16.0)

## 2017-12-17 LAB — COMPREHENSIVE METABOLIC PANEL
ALT: 767 U/L — ABNORMAL HIGH (ref 17–63)
ANION GAP: 12 (ref 5–15)
AST: 550 U/L — ABNORMAL HIGH (ref 15–41)
Albumin: 2.9 g/dL — ABNORMAL LOW (ref 3.5–5.0)
Alkaline Phosphatase: 176 U/L — ABNORMAL HIGH (ref 38–126)
BILIRUBIN TOTAL: 1.6 mg/dL — AB (ref 0.3–1.2)
BUN: 43 mg/dL — AB (ref 6–20)
CO2: 29 mmol/L (ref 22–32)
CREATININE: 2.24 mg/dL — AB (ref 0.61–1.24)
Calcium: 8.1 mg/dL — ABNORMAL LOW (ref 8.9–10.3)
Chloride: 85 mmol/L — ABNORMAL LOW (ref 101–111)
GFR, EST AFRICAN AMERICAN: 30 mL/min — AB (ref >60)
GFR, EST NON AFRICAN AMERICAN: 26 mL/min — AB (ref >60)
Glucose, Bld: 97 mg/dL (ref 65–99)
POTASSIUM: 3 mmol/L — AB (ref 3.5–5.1)
Sodium: 126 mmol/L — ABNORMAL LOW (ref 135–145)
Total Protein: 6.1 g/dL — ABNORMAL LOW (ref 6.5–8.1)

## 2017-12-17 LAB — POCT I-STAT, CHEM 8
BUN: 36 mg/dL — ABNORMAL HIGH (ref 6–20)
Calcium, Ion: 1.01 mmol/L — ABNORMAL LOW (ref 1.15–1.40)
Chloride: 82 mmol/L — ABNORMAL LOW (ref 101–111)
Creatinine, Ser: 2.2 mg/dL — ABNORMAL HIGH (ref 0.61–1.24)
Glucose, Bld: 171 mg/dL — ABNORMAL HIGH (ref 65–99)
HEMATOCRIT: 24 % — AB (ref 39.0–52.0)
HEMOGLOBIN: 8.2 g/dL — AB (ref 13.0–17.0)
Potassium: 3.4 mmol/L — ABNORMAL LOW (ref 3.5–5.1)
SODIUM: 124 mmol/L — AB (ref 135–145)
TCO2: 28 mmol/L (ref 22–32)

## 2017-12-17 LAB — CBC
HEMATOCRIT: 24.4 % — AB (ref 39.0–52.0)
HEMOGLOBIN: 7.8 g/dL — AB (ref 13.0–17.0)
MCH: 25.7 pg — ABNORMAL LOW (ref 26.0–34.0)
MCHC: 32 g/dL (ref 30.0–36.0)
MCV: 80.3 fL (ref 78.0–100.0)
Platelets: 142 10*3/uL — ABNORMAL LOW (ref 150–400)
RBC: 3.04 MIL/uL — ABNORMAL LOW (ref 4.22–5.81)
RDW: 19.5 % — ABNORMAL HIGH (ref 11.5–15.5)
WBC: 5.7 10*3/uL (ref 4.0–10.5)

## 2017-12-17 LAB — HEPARIN LEVEL (UNFRACTIONATED)
HEPARIN UNFRACTIONATED: 0.63 [IU]/mL (ref 0.30–0.70)
Heparin Unfractionated: 0.75 IU/mL — ABNORMAL HIGH (ref 0.30–0.70)

## 2017-12-17 LAB — GLUCOSE, CAPILLARY
Glucose-Capillary: 113 mg/dL — ABNORMAL HIGH (ref 65–99)
Glucose-Capillary: 118 mg/dL — ABNORMAL HIGH (ref 65–99)
Glucose-Capillary: 143 mg/dL — ABNORMAL HIGH (ref 65–99)
Glucose-Capillary: 186 mg/dL — ABNORMAL HIGH (ref 65–99)
Glucose-Capillary: 193 mg/dL — ABNORMAL HIGH (ref 65–99)
Glucose-Capillary: 98 mg/dL (ref 65–99)

## 2017-12-17 LAB — PROTIME-INR
INR: 1.39
Prothrombin Time: 16.9 seconds — ABNORMAL HIGH (ref 11.4–15.2)

## 2017-12-17 LAB — ABO/RH: ABO/RH(D): O POS

## 2017-12-17 LAB — PROCALCITONIN: Procalcitonin: 0.39 ng/mL

## 2017-12-17 LAB — MAGNESIUM: MAGNESIUM: 1.8 mg/dL (ref 1.7–2.4)

## 2017-12-17 MED ORDER — POTASSIUM CHLORIDE 10 MEQ/50ML IV SOLN
10.0000 meq | INTRAVENOUS | Status: AC
  Administered 2017-12-17 (×6): 10 meq via INTRAVENOUS
  Filled 2017-12-17 (×6): qty 50

## 2017-12-17 MED ORDER — MAGNESIUM SULFATE 2 GM/50ML IV SOLN
2.0000 g | Freq: Once | INTRAVENOUS | Status: AC
  Administered 2017-12-17: 2 g via INTRAVENOUS
  Filled 2017-12-17: qty 50

## 2017-12-17 MED ORDER — POTASSIUM CHLORIDE CRYS ER 20 MEQ PO TBCR
40.0000 meq | EXTENDED_RELEASE_TABLET | Freq: Two times a day (BID) | ORAL | Status: DC
  Administered 2017-12-17 – 2017-12-19 (×5): 40 meq via ORAL
  Filled 2017-12-17 (×5): qty 2

## 2017-12-17 MED ORDER — POTASSIUM CHLORIDE 10 MEQ/50ML IV SOLN
10.0000 meq | INTRAVENOUS | Status: AC
  Administered 2017-12-17 (×3): 10 meq via INTRAVENOUS
  Filled 2017-12-17 (×3): qty 50

## 2017-12-17 MED ORDER — TRAMADOL HCL 50 MG PO TABS
50.0000 mg | ORAL_TABLET | Freq: Two times a day (BID) | ORAL | Status: DC | PRN
  Administered 2017-12-17 – 2017-12-22 (×6): 50 mg via ORAL
  Filled 2017-12-17 (×6): qty 1

## 2017-12-17 NOTE — Progress Notes (Signed)
ANTICOAGULATION CONSULT NOTE   Pharmacy Consult for Heparin (Eliquis on hold) Indication: atrial fibrillation   Labs: Recent Labs    01/02/2018 1601 12/16/17 0345 12/16/17 1356 12/16/17 1515 12/17/17 0206  HGB 9.2* 8.3*  --   --   --   HCT 29.1* 25.8*  --   --   --   PLT 179 167  --   --   --   APTT  --  145*  --  115*  --   HEPARINUNFRC  --  1.76*  --  1.24* 0.75*  CREATININE 2.73* 2.61* 2.49*  --   --   TROPONINI 0.06* 0.06*  --   --   --      Assessment:  83 yr old male to begin IV heparin for atrial fibrillation.  Eliquis PTA with last dose reported ~8am 5/2. Transferred to [facility] from [facility] with cardiogenic shock vs cardiorenal syndrome.    Hep lvl remains slightly elevated     Goal of Therapy:  Heparin level 0.3-0.7 units/ml Monitor platelets by anticoagulation protocol: Yes   Plan:  Dec heparin drip to 800 units/hr  Next lvl 1100 Daily hep lvl cbc  Thanks for allowing pharmacy to be a part of this patient's care.  Talbert Cage, PharmD Clinical Pharmacist

## 2017-12-17 NOTE — Progress Notes (Signed)
ANTICOAGULATION CONSULT NOTE   Pharmacy Consult for Heparin (Eliquis on hold) Indication: atrial fibrillation   Labs: Recent Labs    12/14/2017 1601  12/16/17 0345 12/16/17 1356 12/16/17 1515 12/17/17 0206 12/17/17 0341 12/17/17 1115  HGB 9.2*  --  8.3*  --   --   --  7.8*  --   HCT 29.1*  --  25.8*  --   --   --  24.1*  --   PLT 179  --  167  --   --   --  160  --   APTT  --   --  145*  --  115*  --   --   --   LABPROT  --   --   --   --   --   --   --  16.9*  INR  --   --   --   --   --   --   --  1.39  HEPARINUNFRC  --    < > 1.76*  --  1.24* 0.75*  --  0.63  CREATININE 2.73*  --  2.61* 2.49*  --   --  2.24*  --   TROPONINI 0.06*  --  0.06*  --   --   --   --   --    < > = values in this interval not displayed.     Assessment:  83 yr old male to begin IV heparin for atrial fibrillation.  Eliquis PTA with last dose reported ~8am 5/2. Transferred to [facility] from [facility] with cardiogenic shock vs cardiorenal syndrome.    Heparin level now at goal, no overt bleeding or complications noted.     Goal of Therapy:  Heparin level 0.3-0.7 units/ml Monitor platelets by anticoagulation protocol: Yes   Plan:  Continue IV Heparin at 800 units/hr  Confirm level in 8 hrs. Daily heparin level and CBC.  Thanks for allowing pharmacy to be a part of this patient's care.  Tad Moore, BCPS  Clinical Pharmacist Pager [phone]  12/17/2017 12:33 PM

## 2017-12-17 NOTE — Progress Notes (Signed)
Daily Progress Note   Patient Name: [name]       Date: 12/17/2017 DOB: 18-Jul-1935  Age: 83 y.o. MRN#: [id number] Attending Physician: Aldean Jewett, MD Primary Care Physician: Myer Peer, MD Admit Date: 12/24/2017  Reason for Consultation/Follow-up: Establishing goals of care, Non pain symptom management, Pain control and Psychosocial/spiritual support  Subjective: Met with patient chart reviewed; also spoke to son, [name] via phone.  Patient himself is mildly confused, requesting to go home.  He is complaining of bilateral leg pain as well as restless legs (patient is hypokalemic and receiving runs of potassium).  Length of Stay: 2  Current Medications: Scheduled Meds:  . amiodarone  200 mg Oral Daily  . aspirin  81 mg Oral Daily  . Chlorhexidine Gluconate Cloth  6 each Topical Daily  . insulin aspart  0-15 Units Subcutaneous Q4H  . levothyroxine  75 mcg Oral QAC breakfast  . mupirocin ointment  1 application Nasal BID  . potassium chloride  40 mEq Oral BID  . sodium chloride flush  10-40 mL Intracatheter Q12H    Continuous Infusions: . sodium chloride Stopped (12/17/17 1000)  . DOBUTamine 5 mcg/kg/min (12/17/17 1200)  . furosemide (LASIX) infusion 10 mg/hr (12/17/17 1200)  . heparin 800 Units/hr (12/17/17 1200)  . magnesium sulfate 1 - 4 g bolus IVPB    . norepinephrine (LEVOPHED) Adult infusion Stopped (12/16/17 1225)    PRN Meds: sodium chloride, acetaminophen, sodium chloride flush, traMADol  Physical Exam  Constitutional: He appears well-developed and well-nourished.  HENT:  Head: Normocephalic and atraumatic.  Neck: Normal range of motion.  Pulmonary/Chest:  Mild increased work of breathing at rest  Abdominal: Soft. He exhibits distension.    Genitourinary:  Genitourinary Comments: Foley catheter  Musculoskeletal: Normal range of motion.  Neurological: He is alert.  Oriented to self; can follow brief simple commands Short-term memory deficits noted, mild confusion  Skin: Skin is warm and dry. There is pallor.  Psychiatric:  Anxious, agitated; complaining of leg pain  Nursing note and vitals reviewed.           Vital Signs: BP 104/61 (BP Location: Left Arm)   Pulse 82   Temp 98 F (36.7 C) (Oral)   Resp 15   Ht '5\' 10"'$  (1.778 m)   Wt 87.2 kg (  192 lb 3.9 oz)   SpO2 99%   BMI 27.58 kg/m  SpO2: SpO2: 99 % O2 Device: O2 Device: Room Air O2 Flow Rate:    Intake/output summary:   Intake/Output Summary (Last 24 hours) at 12/17/2017 1239 Last data filed at 12/17/2017 1200 Gross per 24 hour  Intake 1913.13 ml  Output 2265 ml  Net -351.87 ml   LBM: Last BM Date: 12/19/2017 Baseline Weight: Weight: 89.6 kg (197 lb 8.5 oz) Most recent weight: Weight: 87.2 kg (192 lb 3.9 oz)       Palliative Assessment/Data:    Flowsheet Rows     Most Recent Value  Intake Tab  Referral Department  Hospitalist  Unit at Time of Referral  ICU  Palliative Care Primary Diagnosis  Cardiac  Date Notified  12/16/17  Palliative Care Type  Return patient Palliative Care  Reason for referral  Clarify Goals of Care  Date of Admission  12/16/17  Date first seen by Palliative Care  12/16/17  # of days Palliative referral response time  0 Day(s)  # of days IP prior to Palliative referral  0  Clinical Assessment  Palliative Performance Scale Score  30%  Psychosocial & Spiritual Assessment  Palliative Care Outcomes  Patient/Family meeting held?  No      Patient Active Problem List   Diagnosis Date Noted  . Acute on chronic respiratory failure with hypoxemia ([place])   . Hypokalemia   . Hyperkalemia 01/08/2018  . Pressure injury of skin 12/26/2017  . Palliative care encounter   . Goals of care, counseling/discussion   . Acute on chronic  diastolic congestive heart failure ([place]) 09/09/2017  . Acute on chronic systolic heart failure ([place]) 08/25/2017  . CHF (congestive heart failure) ([place]) 07/19/2017  . Hyponatremia 04/02/2017  . Normocytic anemia 04/02/2017  . OA (osteoarthritis) 11/19/2016  . Paroxysmal atrial fibrillation ([place]) 09/10/2016  . CKD (chronic kidney disease), stage III ([place]) 09/10/2016  . Physical deconditioning 09/10/2016  . Cardiogenic shock ([place])   . Acute renal failure with acute renal cortical necrosis superimposed on stage 3 chronic kidney disease ([place]) 08/05/2016  . Single implantable cardioverter-defibrillator BSX    . Ventricular tachycardia-treated the ATP 01/29/2011  . Hx of CABG 04/17/2009  . Cardiomyopathy, ischemic 04/17/2009  . Acute on chronic systolic CHF (congestive heart failure) ([place]) 04/17/2009  . Insulin dependent diabetes mellitus ([place]) 04/12/2009  . Hyperlipidemia LDL goal <70 04/12/2009  . Essential hypertension 04/12/2009  . PUD 04/12/2009  . Rheumatoid arthritis ([place]) 04/12/2009    Palliative Care Assessment & Plan   Patient Profile:  83 y.o. male  with past medical history of ICM with EF 20-25% s/p AICD, CAD s/p CABG, HTN, RA, CKD III, who has multiple recent hospitalizations for CHF and is now readmitted on 12/21/2017 with cardiogenic shock requiring pressors.  Palliative care was consulted to help address goals.    Assessment: Spoke to patient's son [name] who is his healthcare power of attorney.  Patient gave me permission to speak to him and states he needs Gregorys help in order to process all that is going on.  Patient himself is open to hospice care in the home.  In speaking to [place] however patient needs to be able to at least be able to pivot himself to the wheelchair and be at his prior baseline before they can continue to take care of him at home.  They are requesting that he be discharged to [place] home for rehab as initial first steps.  [name] does  recognize his father is declining and that hospice would be beneficial.  We went through extensive disease education on weakness and patient's ability to rehab will probably be very limited because of the severity of his heart disease.  I also addressed his defibrillator.  [name] at this point does not want to see that the activated.  I encouraged him though to remember that that is an active device in the setting of worsening heart failure.  I also introduced CODE STATUS and defined those terms.  Informed [name] that his father is was called a partial code in the hospital but that that is not possible in an outpatient setting.  He would want him to be a full DNR upon discharge to collapse nursing home with an active AICD  Also reviewed again hospice support in the home should they be able to bring him back home even for brief period of time for end-of-life care  Recommendations/Plan:  DNR upon discharge to [place] home  Order placed to social work to help facilitate move to skilled nursing facility when medically maximized  Continue partial code while in the hospital   Code Status:    Code Status Orders  (From admission, onward)        Start     Ordered   01/08/2018 1746  Limited resuscitation (code)  Continuous    Question Answer Comment  In the event of cardiac or respiratory ARREST: Initiate Code Blue, Call Rapid Response No   In the event of cardiac or respiratory ARREST: Perform CPR No   In the event of cardiac or respiratory ARREST: Perform Intubation/Mechanical Ventilation No   In the event of cardiac or respiratory ARREST: Use NIPPV/BiPAp only if indicated Yes   In the event of cardiac or respiratory ARREST: Administer ACLS medications if indicated No   In the event of cardiac or respiratory ARREST: Perform Defibrillation or Cardioversion if indicated No      01/06/2018 1745    Code Status History    Date Active Date Inactive Code Status Order ID Comments User  Context   12/26/2017 1543 12/27/2017 1745 Full Code [id number]  Omar Person, NP Inpatient   08/25/2017 1920 08/27/2017 1941 Full Code [id number]  Conrad [place], NP ED   07/20/2017 1710 07/22/2017 1821 Full Code [id number]  Allie Bossier, MD ED   07/19/2017 2154 07/20/2017 1710 Full Code [id number]  Jani Gravel, MD ED   04/02/2017 0450 04/03/2017 1546 Full Code [id number]  Vianne Bulls, MD ED   08/23/2016 1730 09/03/2016 0309 Full Code [id number]  Consuelo Pandy, PA-C ED   01/25/2014 1605 01/25/2014 2005 Full Code [id number]  Deboraha Sprang, MD Inpatient       Prognosis:   < 6 months in the setting of end-stage heart failure, EF 20 to 25%, chronic kidney disease stage III atrial fib; multisystem organ failure.  Patient is at high risk for acute decompensation.  I did share this potential prognosis with patient's son  Discharge Planning:  [place] for rehab with Palliative care service follow-up    Thank you for allowing the Palliative Medicine Team to assist in the care of this patient.   Time In: 0900 Time Out: 1010 Total Time 40 min Prolonged Time Billed  no       Greater than 50%  of this time was spent counseling and coordinating care related to the above assessment and plan.  Dory Horn, NP  Please contact  Palliative Medicine Team phone at [phone] for questions and concerns.      

## 2017-12-17 NOTE — H&P (Signed)
PULMONARY / CRITICAL CARE MEDICINE   Name: [name] MRN: [id number] DOB: [birth year]    ADMISSION DATE:  12/30/2017  REFERRING MD:  [facility] Linsey)   Interval Hx He was somewhat confused overnight. Still on dobutaime, lasix drips. He is also on heparin drip. Denies shortness of breath or chest pain.   CHIEF COMPLAINT: Cardiogenic shock   HISTORY OF PRESENT ILLNESS:   83 year old male with history of CAD status post CABG (2000), hypertension, ischemic cardiomyopathy (EF 20 to 25% 07/20/2017), rheumatoid arthritis, stage III CKD, PAF on Eliquis, diabetes, chronic combined heart failure followed in heart failure clinic. With five admissions in last 10 months related to acute on chronic heart failure.   Presents to [facility] ED on 5/2 with progressive weakness and leg swelling. Upon arrival Patient was noted to be bradycardiac with multiple PVCs, and K of 7.4. Crt 2.5. AST/ALT 1011/629. Treated with Bicarb/Insulin/Dextrose and transferred to [facility] for further management.   Patient states that on 5/1 he had a fall while in was transferring from chair to wheelchair. At baseline he is wheelchair bound due to swelling and weakness in lower extremities. Voices that his son help with his medical decisions and he lives with grand-daughter and her children.   PAST MEDICAL HISTORY :  He  has a past medical history of AICD (automatic cardioverter/defibrillator) present, Arthritis, Congestive heart failure, unspecified, Coronary atherosclerosis of unspecified type of vessel, native or graft, Hypothyroidism, Ischemic cardiomyopathy, Myocardial infarction ([place]) (?2005), Other and unspecified hyperlipidemia, Peptic ulcer, unspecified site, unspecified as acute or chronic, without mention of hemorrhage, perforation, or obstruction, RA (rheumatoid arthritis) ([place]), Type II diabetes mellitus ([place]), Unspecified essential hypertension, and Ventricular tachycardia ([place]).  PAST SURGICAL HISTORY: He  has a past  surgical history that includes implantable cardioverter defibrillator (icd) generator change (N/A, 01/25/2014); Cardiac catheterization (N/A, 09/01/2016); Carpal tunnel release (Left, 05/2008); Carpal tunnel release (Right, 03/2010); Inguinal hernia repair (Right); and Coronary artery bypass graft (2000).  Allergies  Allergen Reactions  . Metformin And Related Other (See Comments)    "Made sugar go wild"   . Rivastigmine Nausea And Vomiting and Other (See Comments)    NO EXELON PATCHES!!  . Penicillins Rash    Details unknown    No current facility-administered medications on file prior to encounter.    Current Outpatient Medications on File Prior to Encounter  Medication Sig  . acetaminophen (TYLENOL) 325 MG tablet Take 650 mg by mouth every 6 (six) hours as needed for mild pain or moderate pain.   Marland Kitchen amiodarone (PACERONE) 200 MG tablet Take 1 tablet (200 mg total) by mouth daily.  Marland Kitchen apixaban (ELIQUIS) 2.5 MG TABS tablet Take 1 tablet (2.5 mg total) 2 (two) times daily by mouth.  Marland Kitchen aspirin 81 MG chewable tablet Chew 81 mg by mouth daily.   Marland Kitchen atorvastatin (LIPITOR) 10 MG tablet Take 10 mg by mouth at bedtime.   . carvedilol (COREG) 3.125 MG tablet Take 1 tablet (3.125 mg total) by mouth 2 (two) times daily with a meal.  . digoxin (LANOXIN) 0.125 MG tablet Take 0.5 tablets (0.0625 mg total) by mouth every other day.  . esomeprazole (NEXIUM) 40 MG capsule Take 40 mg by mouth daily.    Marland Kitchen glyBURIDE (DIABETA) 5 MG tablet Take 5 mg by mouth daily with breakfast.  . HUMULIN R 100 UNIT/ML injection USE VIA PUMP AS DIRECTED WITH V-GO MACHINE AS DIRECTED. MAX DD OF 66 UNITS  . Insulin Disposable Pump (V-GO 30) KIT daily.   Marland Kitchen  levothyroxine (SYNTHROID, LEVOTHROID) 75 MCG tablet Take 1 tablet (75 mcg total) by mouth daily before breakfast.  . metolazone (ZAROXOLYN) 2.5 MG tablet Take 1 tablet (2.5 mg total) by mouth once a week. Take every Saturday with extra 20 meq of Potassium and as prescribed  .  potassium chloride SA (K-DUR,KLOR-CON) 20 MEQ tablet Take 2 tablets (40 mEq total) by mouth 2 (two) times daily.  Marland Kitchen spironolactone (ALDACTONE) 25 MG tablet Take 0.5 tablets (12.5 mg total) by mouth daily.  Marland Kitchen torsemide (DEMADEX) 100 MG tablet Take 1 tablet (100 mg total) by mouth 2 (two) times daily.  . vitamin C (ASCORBIC ACID) 500 MG tablet Take 500 mg by mouth 2 (two) times daily.  . polyethylene glycol (MIRALAX / GLYCOLAX) packet Take 17 g by mouth daily as needed for mild constipation.     FAMILY HISTORY:  His indicated that his mother is deceased. He indicated that his father is deceased. He indicated that the status of his sister is unknown. He indicated that the status of his brother is unknown. He indicated that his maternal grandmother is deceased. He indicated that his maternal grandfather is deceased. He indicated that his paternal grandmother is deceased. He indicated that his paternal grandfather is deceased.   SOCIAL HISTORY: He  reports that he has never smoked. He has never used smokeless tobacco. He reports that he does not drink alcohol or use drugs.  REVIEW OF SYSTEMS:   All negative; except for those that are bolded, which indicate positives.  Constitutional: weight loss, weight gain, night sweats, fevers, chills, fatigue, weakness.  HEENT: headaches, sore throat, sneezing, nasal congestion, post nasal drip, difficulty swallowing, tooth/dental problems, visual complaints, visual changes, ear aches. Neuro: difficulty with speech, weakness, numbness, ataxia. CV:  chest pain, orthopnea, PND, swelling in lower extremities, dizziness, palpitations, syncope.  Resp: cough, hemoptysis, dyspnea, wheezing. GI: heartburn, indigestion, abdominal pain, nausea, vomiting, diarrhea, constipation, change in bowel habits, loss of appetite, hematemesis, melena, hematochezia.  GU: dysuria, change in color of urine, urgency or frequency, flank pain, hematuria. MSK: joint pain or swelling,  decreased range of motion. Psych: change in mood or affect, depression, anxiety, suicidal ideations, homicidal ideations. Skin: rash, itching, bruising.   SUBJECTIVE:    VITAL SIGNS: BP (!) 103/56 (BP Location: Left Arm)   Pulse 90   Temp 98 F (36.7 C) (Oral)   Resp 19   Ht '5\' 10"'$  (1.778 m)   Wt 87.2 kg (192 lb 3.9 oz)   SpO2 100%   BMI 27.58 kg/m   HEMODYNAMICS: CVP:  [8 mmHg-31 mmHg] 15 mmHg  VENTILATOR SETTINGS:    INTAKE / OUTPUT: I/O last 3 completed shifts: In: 3001.6 [P.O.:1300; I.V.:1451.6; IV Piggyback:250] Out: 7939 [Urine:4340]  PHYSICAL EXAMINATION: General:  Chronically ill appearing elderly male, no distress  Neuro:  Alert, oriented to self and place, disoriented to situation/time, follows commands  HEENT:  MMM Cardiovascular:  Irregular, no MRG Lungs:  Diminished to bases, no wheeze, non-labored  Abdomen:  Soft, non-tender  Musculoskeletal:  +3 BLE edema  Skin:  Multiple skin abrasions and bruising noted to all extremities   LABS:  BMET Recent Labs  Lab 12/16/17 0345 12/16/17 1356 12/17/17 0341  NA 130* 126* 126*  K 3.0* 3.8 3.0*  CL 89* 84* 85*  CO2 '28 28 29  '$ BUN 52* 49* 43*  CREATININE 2.61* 2.49* 2.24*  GLUCOSE 32* 204* 97    Electrolytes Recent Labs  Lab 12/26/2017 1601 12/16/17 0345 12/16/17 1356 12/17/17 0341  CALCIUM 9.2 8.5* 8.2* 8.1*  MG 1.9 1.6*  --  1.8  PHOS 4.4 4.5  --   --     CBC Recent Labs  Lab 12/22/2017 1601 12/16/17 0345 12/17/17 0341  WBC 8.8 6.6 5.4  HGB 9.2* 8.3* 7.8*  HCT 29.1* 25.8* 24.1*  PLT 179 167 160    Coag's Recent Labs  Lab 12/16/17 0345 12/16/17 1515  APTT 145* 115*    Sepsis Markers Recent Labs  Lab 12/30/2017 1601 12/16/17 0345 12/17/17 0341  PROCALCITON 0.24 0.44 0.39    ABG No results for input(s): PHART, PCO2ART, PO2ART in the last 168 hours.  Liver Enzymes Recent Labs  Lab 01/04/2018 1601 12/16/17 0345 12/17/17 0341  AST 1,094* 1,074* 550*  ALT 864* 875* 767*   ALKPHOS 245* 195* 176*  BILITOT 2.3* 1.7* 1.6*  ALBUMIN 3.2* 3.0* 2.9*    Cardiac Enzymes Recent Labs  Lab 12/17/2017 1601 12/16/17 0345  TROPONINI 0.06* 0.06*    Glucose Recent Labs  Lab 12/16/17 1157 12/16/17 1559 12/16/17 1940 12/17/17 0005 12/17/17 0352 12/17/17 0751  GLUCAP 212* 227* 178* 143* 113* 98    Imaging No results found.   STUDIES:  CXR 5/2 >>   CULTURES: None.   ANTIBIOTICS: None.   SIGNIFICANT EVENTS: 5/2 > Arrived to Hospital   LINES/TUBES: PIV   DISCUSSION: 83 year old male in cardiogenic shock presents hyperkalemic and bradycardiac. Transferred from [facility] to [facility] for further management.   ASSESSMENT / PLAN:  PULMONARY A: No issues  P:   Maintain Oxygen Saturation >92 On nasal canula  Pulmonary Hygiene  DNI, BiPAP Okay   CARDIOVASCULAR A:  Cardiogenic Shock in setting of decompensated chronic systolic/diastolic HF (EF 67-61) vs Cardiorenal syndrome  H/O CAD s/p CABG, HTN, HLD, PAF on Eliquis  P:  Continue dobutamine as per cards Increase lasix drip to '10mg'$ /hr Cardiac Monitoring  Resuming amio  We appreciate Heart Failure service follow up  Continue ASA Heparin gtt (hold eliquis)  Hold Lipitor, Coreg, Dig, Spirolactone, Torsemide  Trend Troponin   RENAL A:   Acute on Chronic Kidney Disease Stage 4 (Baseline Crt 1.7-1.8)  Hyperkalemia resolved  Hypokalemia  Per Family has received Multiple does of K from cardiology office for hypokalemia over last 2 days as well as 2 doses of metolazone, also takes spironolactone  Hyponatremia  P:   replace potasium and magnesium again  today  Trend BMP kidney function improving  Lasix gtt as above   GASTROINTESTINAL A:   Transaminase in setting of cardiogenic shock  Shocked liver  P:   Cardiac diet   Fluid restriction  LFT   HEMATOLOGIC A:   Anticoagulation needs in setting of PAF Anemia of Chronic Disease  P:  Trend CBC  Heparin gtt as above    INFECTIOUS A:   No issues  P:   Trend WBC and Fever Curve Trend PCT   ENDOCRINE A:   DM   P:   Trend Glucose  SSI   NEUROLOGIC A:   Memory Loss (Family reports dementia)   Multiple Fall secondary to weakness ICU delirium   P:   Monitor  PT/OT > May need SNF placement, currently leaves with grand-daughter    FAMILY  - Updates: no family available    - Inter-disciplinary family meet or Palliative Care meeting due by: 12/22/2017

## 2017-12-17 NOTE — Progress Notes (Signed)
Patient ID: [name], male   DOB: 08-20-1934, 83 y.o.   MRN: [id number]     Advanced Heart Failure Rounding Note  PCP-Cardiologist: Olga Millers, MD   Subjective:    Admitted with volume overload. He was in cardiogenic shock.   Started on milrinone but later switched to dobutamine due to hypotension. Norepi later added. Norepinephrine weaned off on 5/3. This morning, stable with SBP in 100s.  Remains on dobutamine 5, co-ox 51% but drawn while asleep and presumable sats lower.   I/Os even with Lasix gtt at 6, CVP 14-15 this morning.  Creatinine down to 2.24. LFTs trending down.  PCT 0.39, not significantly elevated.   He is much more alert this morning and knows who I am.     Objective:   Weight Range: 192 lb 3.9 oz (87.2 kg) Body mass index is 27.58 kg/m.   Vital Signs:   Temp:  [97.3 F (36.3 C)-98.1 F (36.7 C)] 98 F (36.7 C) (05/04 0758) Pulse Rate:  [41-101] 77 (05/04 0730) Resp:  [11-28] 20 (05/04 0730) BP: (93-122)/(49-99) 100/81 (05/04 0730) SpO2:  [94 %-100 %] 96 % (05/04 0730) Weight:  [192 lb 3.9 oz (87.2 kg)] 192 lb 3.9 oz (87.2 kg) (05/04 0600) Last BM Date: 2018-01-06  Weight change: Filed Weights   01-06-18 1600 12/16/17 0600 12/17/17 0600  Weight: 197 lb 8.5 oz (89.6 kg) 191 lb 12.8 oz (87 kg) 192 lb 3.9 oz (87.2 kg)    Intake/Output:   Intake/Output Summary (Last 24 hours) at 12/17/2017 0820 Last data filed at 12/17/2017 0600 Gross per 24 hour  Intake 2221.43 ml  Output 2475 ml  Net -253.57 ml      Physical Exam  CVP 14-15 General: NAD Neck: JVP 10-12 cm, no thyromegaly or thyroid nodule.  Lungs: Crackles at bases bilaterally.  CV: Lateral PMI.  Heart regular S1/S2, no S3/S4, no murmur.  1+ edema to knees bilaterally.   Abdomen: Soft, nontender, no hepatosplenomegaly, no distention.  Skin: Intact without lesions or rashes.  Neurologic: Alert and oriented x 3.  Psych: Normal affect. Extremities: No clubbing or cyanosis.  HEENT: Normal.     Telemetry   NSR in 70s-80s, rare PVCs  EKG    N/A   Labs    CBC Recent Labs    12/16/17 0345 12/17/17 0341  WBC 6.6 5.4  NEUTROABS  --  4.3  HGB 8.3* 7.8*  HCT 25.8* 24.1*  MCV 80.4 80.3  PLT 167 160   Basic Metabolic Panel Recent Labs    29/79/89 1601 12/16/17 0345 12/16/17 1356 12/17/17 0341  NA 127* 130* 126* 126*  K 5.9* 3.0* 3.8 3.0*  CL 89* 89* 84* 85*  CO2 25 28 28 29   GLUCOSE 166* 32* 204* 97  BUN 55* 52* 49* 43*  CREATININE 2.73* 2.61* 2.49* 2.24*  CALCIUM 9.2 8.5* 8.2* 8.1*  MG 1.9 1.6*  --  1.8  PHOS 4.4 4.5  --   --    Liver Function Tests Recent Labs    12/16/17 0345 12/17/17 0341  AST 1,074* 550*  ALT 875* 767*  ALKPHOS 195* 176*  BILITOT 1.7* 1.6*  PROT 6.0* 6.1*  ALBUMIN 3.0* 2.9*   No results for input(s): LIPASE, AMYLASE in the last 72 hours. Cardiac Enzymes Recent Labs    Jan 06, 2018 1601 12/16/17 0345  TROPONINI 0.06* 0.06*    BNP: BNP (last 3 results) Recent Labs    08/25/17 1140 09/09/17 0525 12/16/17 0345  BNP 1,102.9* 1,105.4*  1,012.3*    ProBNP (last 3 results) No results for input(s): PROBNP in the last 8760 hours.   D-Dimer No results for input(s): DDIMER in the last 72 hours. Hemoglobin A1C No results for input(s): HGBA1C in the last 72 hours. Fasting Lipid Panel No results for input(s): CHOL, HDL, LDLCALC, TRIG, CHOLHDL, LDLDIRECT in the last 72 hours. Thyroid Function Tests No results for input(s): TSH, T4TOTAL, T3FREE, THYROIDAB in the last 72 hours.  Invalid input(s): FREET3  Other results:   Imaging    No results found.   Medications:     Scheduled Medications: . amiodarone  200 mg Oral Daily  . aspirin  81 mg Oral Daily  . Chlorhexidine Gluconate Cloth  6 each Topical Daily  . insulin aspart  0-15 Units Subcutaneous Q4H  . levothyroxine  75 mcg Oral QAC breakfast  . mupirocin ointment  1 application Nasal BID  . potassium chloride  40 mEq Oral BID  . sodium chloride flush   10-40 mL Intracatheter Q12H    Infusions: . sodium chloride 10 mL/hr at 12/17/17 0600  . DOBUTamine 5 mcg/kg/min (12/17/17 0600)  . furosemide (LASIX) infusion 6 mg/hr (12/17/17 0600)  . heparin 800 Units/hr (12/17/17 0600)  . norepinephrine (LEVOPHED) Adult infusion Stopped (12/16/17 1225)  . potassium chloride 10 mEq (12/17/17 0755)    PRN Medications: sodium chloride, acetaminophen, sodium chloride flush    Patient Profile   Raymond Keeven Jonesis a 82 y.o.malewith CAD s/p CABG, hypertension, hyperlipidemia and chronic systolic and diastolic heart failure presenting today with sob at rest and increased leg edema.    Assessment/Plan   1. Acute on chronic systolic CHF with cardiogenic shock: EF 20-25% in 12/18, ischemic cardiomyopathy.  Echo this admission with EF 25%, mild MR, mildly dilated RV, PASP 57 mmHg.  Today, co-ox down to 51% but drawn overnight while sleeping.  SBP 100s off norepinephrine.  Remains on dobutamine 5 and Lasix 6 mg/hr, CVP 14-15.  - Continue dobutamine 5, repeat co-ox this morning.  - With higher CVP, increase Lasix to 10 mg/hr.  - Home spironolactone, Coreg, and digoxin on hold for now.   2. CAD: s/p CABG.  Mild TnI elevation likely demand ischemia.  Coronary angiography 1/18 with patent graft and no targets for revascularization. - Statin held for now with shock liver.  - Getting ASA 81 daily.  3. Atrial fibrillation: Paroxysmal.  He is in NSR today.  - Can continue amiodarone 200 daily to keep in NSR, elevated LFTs from shock liver and trending down.  - Currently on heparin gtt, continue for now but can restart Eliquis eventually.  4. Elevated LFTs: Suspect shock liver. Support cardiac output. CMET daily. LFTs trending down.  5. Hyponatremia: Hypervolemic hyponatremia.  Stable at 126.  Fluid restrict now that he is back to taking po.  6. Delirium: Likely related to medical illness. Much improved today.  7. AKI: Suspect cardiorenal with cardiogenic  shock. Creatinine trending down to 2.24 today.  Baseline creatinine around 2.0.  8. Code status: DNR/DNI.  Agree with palliative care conversation.  9. Hypokalemia: Replace.   CRITICAL CARE Performed by: Marca Ancona  Total critical care time: 35 minutes  Critical care time was exclusive of separately billable procedures and treating other patients.  Critical care was necessary to treat or prevent imminent or life-threatening deterioration.  Critical care was time spent personally by me on the following activities: development of treatment plan with patient and/or surrogate as well as nursing, discussions with consultants, evaluation  of patient's response to treatment, examination of patient, obtaining history from patient or surrogate, ordering and performing treatments and interventions, ordering and review of laboratory studies, ordering and review of radiographic studies, pulse oximetry and re-evaluation of patient's condition.  Marca Ancona 12/17/2017 8:20 AM

## 2017-12-17 NOTE — Progress Notes (Signed)
eLink Physician-Brief Progress Note Patient Name: [name] DOB: Oct 21, 1934 MRN: [id number]   Date of Service  12/17/2017  HPI/Events of Note  Hypokalemia  eICU Interventions  Potassium replaced     Intervention Category Intermediate Interventions: Electrolyte abnormality - evaluation and management  DETERDING,ELIZABETH 12/17/2017, 6:04 AM

## 2017-12-18 ENCOUNTER — Inpatient Hospital Stay (HOSPITAL_COMMUNITY): Payer: Medicare Other

## 2017-12-18 DIAGNOSIS — I255 Ischemic cardiomyopathy: Secondary | ICD-10-CM

## 2017-12-18 DIAGNOSIS — E871 Hypo-osmolality and hyponatremia: Secondary | ICD-10-CM

## 2017-12-18 DIAGNOSIS — R451 Restlessness and agitation: Secondary | ICD-10-CM

## 2017-12-18 DIAGNOSIS — G9341 Metabolic encephalopathy: Secondary | ICD-10-CM

## 2017-12-18 LAB — COMPREHENSIVE METABOLIC PANEL
ALBUMIN: 2.9 g/dL — AB (ref 3.5–5.0)
ALK PHOS: 150 U/L — AB (ref 38–126)
ALT: 543 U/L — AB (ref 17–63)
AST: 282 U/L — AB (ref 15–41)
Anion gap: 9 (ref 5–15)
BILIRUBIN TOTAL: 1.9 mg/dL — AB (ref 0.3–1.2)
BUN: 41 mg/dL — AB (ref 6–20)
CO2: 28 mmol/L (ref 22–32)
CREATININE: 2.09 mg/dL — AB (ref 0.61–1.24)
Calcium: 7.9 mg/dL — ABNORMAL LOW (ref 8.9–10.3)
Chloride: 85 mmol/L — ABNORMAL LOW (ref 101–111)
GFR calc Af Amer: 33 mL/min — ABNORMAL LOW (ref >60)
GFR, EST NON AFRICAN AMERICAN: 28 mL/min — AB (ref >60)
GLUCOSE: 95 mg/dL (ref 65–99)
POTASSIUM: 4.9 mmol/L (ref 3.5–5.1)
Sodium: 122 mmol/L — ABNORMAL LOW (ref 135–145)
TOTAL PROTEIN: 6 g/dL — AB (ref 6.5–8.1)

## 2017-12-18 LAB — BASIC METABOLIC PANEL
ANION GAP: 10 (ref 5–15)
BUN: 37 mg/dL — ABNORMAL HIGH (ref 6–20)
CHLORIDE: 87 mmol/L — AB (ref 101–111)
CO2: 29 mmol/L (ref 22–32)
Calcium: 8.4 mg/dL — ABNORMAL LOW (ref 8.9–10.3)
Creatinine, Ser: 1.98 mg/dL — ABNORMAL HIGH (ref 0.61–1.24)
GFR calc non Af Amer: 30 mL/min — ABNORMAL LOW (ref >60)
GFR, EST AFRICAN AMERICAN: 35 mL/min — AB (ref >60)
Glucose, Bld: 154 mg/dL — ABNORMAL HIGH (ref 65–99)
Potassium: 3.8 mmol/L (ref 3.5–5.1)
Sodium: 126 mmol/L — ABNORMAL LOW (ref 135–145)

## 2017-12-18 LAB — CBC WITH DIFFERENTIAL/PLATELET
BASOS ABS: 0 10*3/uL (ref 0.0–0.1)
Basophils Relative: 0 %
EOS PCT: 1 %
Eosinophils Absolute: 0 10*3/uL (ref 0.0–0.7)
HCT: 25 % — ABNORMAL LOW (ref 39.0–52.0)
Hemoglobin: 7.6 g/dL — ABNORMAL LOW (ref 13.0–17.0)
LYMPHS ABS: 0.5 10*3/uL — AB (ref 0.7–4.0)
Lymphocytes Relative: 11 %
MCH: 25.5 pg — AB (ref 26.0–34.0)
MCHC: 30.4 g/dL (ref 30.0–36.0)
MCV: 83.9 fL (ref 78.0–100.0)
MONO ABS: 0.5 10*3/uL (ref 0.1–1.0)
Monocytes Relative: 11 %
NEUTROS ABS: 3.6 10*3/uL (ref 1.7–7.7)
Neutrophils Relative %: 77 %
PLATELETS: 215 10*3/uL (ref 150–400)
RBC: 2.98 MIL/uL — ABNORMAL LOW (ref 4.22–5.81)
RDW: 23.3 % — AB (ref 11.5–15.5)
WBC: 4.6 10*3/uL (ref 4.0–10.5)

## 2017-12-18 LAB — COOXEMETRY PANEL
CARBOXYHEMOGLOBIN: 1.6 % — AB (ref 0.5–1.5)
Methemoglobin: 1.7 % — ABNORMAL HIGH (ref 0.0–1.5)
O2 Saturation: 58.1 %
Total hemoglobin: 7.9 g/dL — ABNORMAL LOW (ref 12.0–16.0)

## 2017-12-18 LAB — GLUCOSE, CAPILLARY
GLUCOSE-CAPILLARY: 136 mg/dL — AB (ref 65–99)
GLUCOSE-CAPILLARY: 155 mg/dL — AB (ref 65–99)
GLUCOSE-CAPILLARY: 220 mg/dL — AB (ref 65–99)
Glucose-Capillary: 111 mg/dL — ABNORMAL HIGH (ref 65–99)
Glucose-Capillary: 196 mg/dL — ABNORMAL HIGH (ref 65–99)
Glucose-Capillary: 97 mg/dL (ref 65–99)

## 2017-12-18 LAB — IRON AND TIBC
IRON: 27 ug/dL — AB (ref 45–182)
Saturation Ratios: 8 % — ABNORMAL LOW (ref 17.9–39.5)
TIBC: 351 ug/dL (ref 250–450)
UIBC: 324 ug/dL

## 2017-12-18 LAB — HEMOGLOBIN AND HEMATOCRIT, BLOOD
HEMATOCRIT: 28.9 % — AB (ref 39.0–52.0)
HEMOGLOBIN: 9.1 g/dL — AB (ref 13.0–17.0)

## 2017-12-18 LAB — FERRITIN: Ferritin: 92 ng/mL (ref 24–336)

## 2017-12-18 LAB — PREPARE RBC (CROSSMATCH)

## 2017-12-18 LAB — VITAMIN B12: VITAMIN B 12: 1057 pg/mL — AB (ref 180–914)

## 2017-12-18 LAB — HEPARIN LEVEL (UNFRACTIONATED): Heparin Unfractionated: 0.47 IU/mL (ref 0.30–0.70)

## 2017-12-18 MED ORDER — MIRTAZAPINE 15 MG PO TABS
15.0000 mg | ORAL_TABLET | Freq: Once | ORAL | Status: DC
  Filled 2017-12-18: qty 1

## 2017-12-18 MED ORDER — DEXMEDETOMIDINE HCL IN NACL 200 MCG/50ML IV SOLN
0.2000 ug/kg/h | INTRAVENOUS | Status: DC
  Administered 2017-12-18: 0.5 ug/kg/h via INTRAVENOUS
  Administered 2017-12-18: 0.4 ug/kg/h via INTRAVENOUS
  Administered 2017-12-18: 0.5 ug/kg/h via INTRAVENOUS
  Administered 2017-12-19: 0.7 ug/kg/h via INTRAVENOUS
  Administered 2017-12-19: 0.5 ug/kg/h via INTRAVENOUS
  Administered 2017-12-19: 0.4 ug/kg/h via INTRAVENOUS
  Administered 2017-12-19: 0.5 ug/kg/h via INTRAVENOUS
  Administered 2017-12-19 (×2): 0.6 ug/kg/h via INTRAVENOUS
  Administered 2017-12-20 (×7): 0.7 ug/kg/h via INTRAVENOUS
  Administered 2017-12-21: 0.5 ug/kg/h via INTRAVENOUS
  Administered 2017-12-21 (×2): 0.3 ug/kg/h via INTRAVENOUS
  Administered 2017-12-21 (×2): 0.7 ug/kg/h via INTRAVENOUS
  Administered 2017-12-22: 0.4 ug/kg/h via INTRAVENOUS
  Administered 2017-12-22: 0.3 ug/kg/h via INTRAVENOUS
  Administered 2017-12-23: 0.7 ug/kg/h via INTRAVENOUS
  Filled 2017-12-18 (×10): qty 50
  Filled 2017-12-18: qty 100
  Filled 2017-12-18 (×14): qty 50

## 2017-12-18 MED ORDER — TOLVAPTAN 15 MG PO TABS
15.0000 mg | ORAL_TABLET | Freq: Once | ORAL | Status: AC
  Administered 2017-12-18: 15 mg via ORAL
  Filled 2017-12-18: qty 1

## 2017-12-18 MED ORDER — BISACODYL 10 MG RE SUPP
10.0000 mg | Freq: Every day | RECTAL | Status: DC | PRN
  Administered 2017-12-18: 10 mg via RECTAL
  Filled 2017-12-18: qty 1

## 2017-12-18 MED ORDER — SODIUM CHLORIDE 0.9 % IV SOLN
Freq: Once | INTRAVENOUS | Status: AC
  Administered 2017-12-18: 10:00:00 via INTRAVENOUS

## 2017-12-18 NOTE — Progress Notes (Addendum)
Patient ID: [name], male   DOB: 10-26-1934, 83 y.o.   MRN: [id number]     Advanced Heart Failure Rounding Note  PCP-Cardiologist: Olga Millers, MD   Subjective:    Admitted with volume overload. He was in cardiogenic shock.   Started on milrinone but later switched to dobutamine due to hypotension. Norepi later added. Norepinephrine weaned off on 5/3. This morning, stable with SBP in 100s.  Remains on dobutamine 5, co-ox 58%.   He did not diurese particularly well yesterday, weight up 3 lbs with CVP 14.    He is more confused this morning, able to recognize who I am.  Very confused overnight.  Now on Precedex gtt.  Sodium down to 122, nurse says that patient will not comply with fluid restriction.   Hemoglobin down to 7.6 but denies signs of overt bleeding.   Objective:   Weight Range: 195 lb 8.8 oz (88.7 kg) Body mass index is 28.06 kg/m.   Vital Signs:   Temp:  [97.5 F (36.4 C)-98.7 F (37.1 C)] 97.5 F (36.4 C) (05/05 0732) Pulse Rate:  [71-87] 71 (05/05 0800) Resp:  [10-23] 11 (05/05 0800) BP: (89-116)/(50-86) 104/68 (05/05 0800) SpO2:  [94 %-100 %] 96 % (05/05 0800) Weight:  [195 lb 8.8 oz (88.7 kg)] 195 lb 8.8 oz (88.7 kg) (05/05 0435) Last BM Date: 12/29/2017  Weight change: Filed Weights   12/16/17 0600 12/17/17 0600 12/18/17 0435  Weight: 191 lb 12.8 oz (87 kg) 192 lb 3.9 oz (87.2 kg) 195 lb 8.8 oz (88.7 kg)    Intake/Output:   Intake/Output Summary (Last 24 hours) at 12/18/2017 0837 Last data filed at 12/18/2017 0800 Gross per 24 hour  Intake 1993.6 ml  Output 1880 ml  Net 113.6 ml      Physical Exam  CVP 14 General: NAD Neck: JVP 10-12 cm, no thyromegaly or thyroid nodule.  Lungs: Clear to auscultation bilaterally with normal respiratory effort. CV: Nondisplaced PMI.  Heart regular S1/S2, no S3/S4, no murmur.  1+ edema 1/2 to knees bilaterally.   Abdomen: Soft, nontender, no hepatosplenomegaly, no distention.  Skin: Intact without lesions or  rashes.  Neurologic: Confused.   Extremities: No clubbing or cyanosis.  HEENT: Normal.    Telemetry   NSR in 70s  EKG    N/A   Labs    CBC Recent Labs    12/17/17 0341 12/17/17 1232 12/17/17 1614 12/18/17 0414  WBC 5.4 5.7  --  4.6  NEUTROABS 4.3  --   --  3.6  HGB 7.8* 7.8* 8.2* 7.6*  HCT 24.1* 24.4* 24.0* 25.0*  MCV 80.3 80.3  --  83.9  PLT 160 142*  --  215   Basic Metabolic Panel Recent Labs    29/52/84 1601 12/16/17 0345  12/17/17 0341 12/17/17 1614 12/18/17 0414  NA 127* 130*   < > 126* 124* 122*  K 5.9* 3.0*   < > 3.0* 3.4* 4.9  CL 89* 89*   < > 85* 82* 85*  CO2 25 28   < > 29  --  28  GLUCOSE 166* 32*   < > 97 171* 95  BUN 55* 52*   < > 43* 36* 41*  CREATININE 2.73* 2.61*   < > 2.24* 2.20* 2.09*  CALCIUM 9.2 8.5*   < > 8.1*  --  7.9*  MG 1.9 1.6*  --  1.8  --   --   PHOS 4.4 4.5  --   --   --   --    < > =  values in this interval not displayed.   Liver Function Tests Recent Labs    12/17/17 0341 12/18/17 0414  AST 550* 282*  ALT 767* 543*  ALKPHOS 176* 150*  BILITOT 1.6* 1.9*  PROT 6.1* 6.0*  ALBUMIN 2.9* 2.9*   No results for input(s): LIPASE, AMYLASE in the last 72 hours. Cardiac Enzymes Recent Labs    30-Dec-2017 1601 12/16/17 0345  TROPONINI 0.06* 0.06*    BNP: BNP (last 3 results) Recent Labs    08/25/17 1140 09/09/17 0525 12/16/17 0345  BNP 1,102.9* 1,105.4* 1,012.3*    ProBNP (last 3 results) No results for input(s): PROBNP in the last 8760 hours.   D-Dimer No results for input(s): DDIMER in the last 72 hours. Hemoglobin A1C No results for input(s): HGBA1C in the last 72 hours. Fasting Lipid Panel No results for input(s): CHOL, HDL, LDLCALC, TRIG, CHOLHDL, LDLDIRECT in the last 72 hours. Thyroid Function Tests No results for input(s): TSH, T4TOTAL, T3FREE, THYROIDAB in the last 72 hours.  Invalid input(s): FREET3  Other results:   Imaging    No results found.   Medications:     Scheduled  Medications: . amiodarone  200 mg Oral Daily  . aspirin  81 mg Oral Daily  . Chlorhexidine Gluconate Cloth  6 each Topical Daily  . insulin aspart  0-15 Units Subcutaneous Q4H  . levothyroxine  75 mcg Oral QAC breakfast  . mirtazapine  15 mg Oral Once  . mupirocin ointment  1 application Nasal BID  . potassium chloride  40 mEq Oral BID  . sodium chloride flush  10-40 mL Intracatheter Q12H  . tolvaptan  15 mg Oral Once    Infusions: . sodium chloride Stopped (12/17/17 1000)  . sodium chloride    . dexmedetomidine (PRECEDEX) IV infusion 0.5 mcg/kg/hr (12/18/17 0800)  . DOBUTamine 5 mcg/kg/min (12/18/17 0800)  . furosemide (LASIX) infusion 10 mg/hr (12/18/17 0800)  . heparin 800 Units/hr (12/18/17 0800)  . norepinephrine (LEVOPHED) Adult infusion Stopped (12/16/17 1225)    PRN Medications: sodium chloride, acetaminophen, sodium chloride flush, traMADol    Patient Profile   Raymond Grzyb Jonesis a 82 y.o.malewith CAD s/p CABG, hypertension, hyperlipidemia and chronic systolic and diastolic heart failure presenting today with sob at rest and increased leg edema.    Assessment/Plan   1. Acute on chronic systolic CHF with cardiogenic shock: EF 20-25% in 12/18, ischemic cardiomyopathy.  Echo this admission with EF 25%, mild MR, mildly dilated RV, PASP 57 mmHg.  Today, co-ox 58%.  SBP 100s off norepinephrine.  Remains on dobutamine 5 and Lasix 10 mg/hr, CVP 14. Creatinine down to 2.09.  - Continue dobutamine 5.  - Increase Lasix to 15 mg/hr and will give a dose of tolvaptan 15.  - Home spironolactone, Coreg, and digoxin on hold for now.   2. CAD: s/p CABG.  Mild TnI elevation likely demand ischemia.  Coronary angiography 1/18 with patent graft and no targets for revascularization. - Statin held for now with shock liver.  - Getting ASA 81 daily.  3. Atrial fibrillation: Paroxysmal.  He is in NSR today.  - Can continue amiodarone 200 daily to keep in NSR, elevated LFTs from shock  liver and trending down.  - Currently on heparin gtt, continue for now but can restart Eliquis eventually.  Hemoglobin down but no overt bleeding.  4. Elevated LFTs: Suspect shock liver. Support cardiac output. CMET daily. LFTs trending down.  5. Hyponatremia: Hypervolemic hyponatremia.  Lower at 122.  Resisting fluid restriction.  -  Will give tolvaptan 15 mg now.   6. Delirium: Worse today in setting of worsening hyponatremia.   - Reinforced fluid restriction.  - Will give dose of tolvaptan.  - On Precedex.   7. AKI: Suspect cardiorenal with cardiogenic shock. Creatinine trending down to 2.09 today.  Baseline creatinine around 2.0.  8. Code status: DNR/DNI.  Palliative care came by yesterday.  Hospice discussions begun, son not ready yet to turn off ICD.  Will definitely need SNF at discharge (will be unable to return home).   9. Hypokalemia: Resolved.   10. Anemia: Hgb 7.6, no sign of overt bleeding.  Suspect chronic disease, renal disease play a role.   - Will transfuse 1 unit.  - Send anemia panel.  - guaiac stool.   CRITICAL CARE Performed by: Marca Ancona  Total critical care time: 35 minutes  Critical care time was exclusive of separately billable procedures and treating other patients.  Critical care was necessary to treat or prevent imminent or life-threatening deterioration.  Critical care was time spent personally by me on the following activities: development of treatment plan with patient and/or surrogate as well as nursing, discussions with consultants, evaluation of patient's response to treatment, examination of patient, obtaining history from patient or surrogate, ordering and performing treatments and interventions, ordering and review of laboratory studies, ordering and review of radiographic studies, pulse oximetry and re-evaluation of patient's condition.  Marca Ancona 12/18/2017 8:37 AM

## 2017-12-18 NOTE — Progress Notes (Signed)
ANTICOAGULATION CONSULT NOTE   Pharmacy Consult for Heparin (Eliquis on hold) Indication: atrial fibrillation   Labs: Recent Labs    01/03/2018 1601  12/16/17 0345  12/16/17 1515 12/17/17 0206 12/17/17 0341 12/17/17 1115 12/17/17 1232 12/17/17 1614 12/18/17 0414 12/18/17 0933  HGB 9.2*  --  8.3*  --   --   --  7.8*  --  7.8* 8.2* 7.6*  --   HCT 29.1*  --  25.8*  --   --   --  24.1*  --  24.4* 24.0* 25.0*  --   PLT 179  --  167  --   --   --  160  --  142*  --  215  --   APTT  --   --  145*  --  115*  --   --   --   --   --   --   --   LABPROT  --   --   --   --   --   --   --  16.9*  --   --   --   --   INR  --   --   --   --   --   --   --  1.39  --   --   --   --   HEPARINUNFRC  --    < > 1.76*  --  1.24* 0.75*  --  0.63  --   --   --  0.47  CREATININE 2.73*  --  2.61*   < >  --   --  2.24*  --   --  2.20* 2.09*  --   TROPONINI 0.06*  --  0.06*  --   --   --   --   --   --   --   --   --    < > = values in this interval not displayed.     Assessment:  83 yr old male to begin IV heparin for atrial fibrillation.  Eliquis PTA with last dose reported ~8am 5/2. Transferred to [facility] from [facility] with cardiogenic shock vs cardiorenal syndrome.    Heparin level now at goal, no overt bleeding or complications noted.  Heparin running through IJ, so levels being drawn peripherally.     Goal of Therapy:  Heparin level 0.3-0.7 units/ml Monitor platelets by anticoagulation protocol: Yes   Plan:  Continue IV Heparin at 800 units/hr  Daily heparin level and CBC. F/u plans to restart Eliquis as appropriate.  Thanks for allowing pharmacy to be a part of this patient's care.  Reece Leader, Colon Flattery, [facility] Clinical Pharmacist Pager [phone]  12/18/2017 10:44 AM

## 2017-12-18 NOTE — Progress Notes (Signed)
Pt refuse X-ray this morning, tried to redirect but became agitated.

## 2017-12-18 NOTE — Progress Notes (Signed)
Pt has become more and more agitate through out the night, he has refuse medication, Xray, and lab draw.  Pt called out and I entered the room, he had blood on his face and hands from where he had been picking at his skin tears.  He became irrate when I tried to clean him up and demanded to leave, called elink who ordered Precedex.  I called his son and let the patient speak with him, he continue to yell at his son.  I spoke with [name] (son) and he understood what was going on and he will visit today, ltr in the day.

## 2017-12-18 NOTE — Progress Notes (Signed)
Daily Progress Note   Patient Name: [name]       Date: 12/18/2017 DOB: [birth year]  Age: 83 y.o. MRN#: [id number] Attending Physician: Aldean Jewett, MD Primary Care Physician: Myer Peer, MD Admit Date: 12/25/2017  Reason for Consultation/Follow-up: Establishing goals of care and Psychosocial/spiritual support  Subjective: Patient seen, chart reviewed.  Patient became agitated overnight and was started on Precedex.  He has become more alert as the day is gone on but still quite confused.  Met with patient's son Belenda Cruise, healthcare power of attorney, updated as to events overnight as well as review of pathophysiology of patient's heart failure and associated comorbidities of chronic kidney disease  I attempted to prepare Belenda Cruise for the possibility that patient may not rebound to the point of going to [facility] for rehab.  Son acknowledges that he too sees his father as potentially "it could go either way".  At this point I did raise the option of if he continues to decline  we could consider residential hospice in [facility].  Son is tearful, still trying to maintain hope, but appears to be recognizing that his father may be nearing end of life.  Patient's wife died 2 years ago and son shares that they have seen a sharp decline over that time.  Length of Stay: 3  Current Medications: Scheduled Meds:  . amiodarone  200 mg Oral Daily  . aspirin  81 mg Oral Daily  . Chlorhexidine Gluconate Cloth  6 each Topical Daily  . insulin aspart  0-15 Units Subcutaneous Q4H  . levothyroxine  75 mcg Oral QAC breakfast  . mirtazapine  15 mg Oral Once  . mupirocin ointment  1 application Nasal BID  . potassium chloride  40 mEq Oral BID  . sodium chloride flush  10-40 mL  Intracatheter Q12H    Continuous Infusions: . sodium chloride Stopped (12/17/17 1000)  . dexmedetomidine (PRECEDEX) IV infusion 0.4 mcg/kg/hr (12/18/17 1800)  . DOBUTamine 5 mcg/kg/min (12/18/17 1800)  . furosemide (LASIX) infusion 15 mg/hr (12/18/17 1800)  . heparin 800 Units/hr (12/18/17 1800)  . norepinephrine (LEVOPHED) Adult infusion Stopped (12/16/17 1225)    PRN Meds: sodium chloride, acetaminophen, bisacodyl, sodium chloride flush, traMADol  Physical Exam  Constitutional: He appears well-developed and well-nourished.  Ill appearing older man seen in  ICU  Cardiovascular: Normal rate.  Pulmonary/Chest:  Mild increased work of breathing noted at rest  Abdominal: Soft. He exhibits distension.  Musculoskeletal: Normal range of motion.  Neurological:  Fluctuating level of consciousness  Skin: Skin is warm and dry. There is pallor.  Psychiatric:  Psychomotor restlessness, agitation overnight requiring Precedex Otherwise unable to test  Nursing note and vitals reviewed.           Vital Signs: BP 112/71   Pulse 67   Temp (!) 97.4 F (36.3 C) (Oral)   Resp 12   Ht '5\' 10"'$  (1.778 m)   Wt 88.7 kg (195 lb 8.8 oz)   SpO2 97%   BMI 28.06 kg/m  SpO2: SpO2: 97 % O2 Device: O2 Device: Room Air O2 Flow Rate:    Intake/output summary:   Intake/Output Summary (Last 24 hours) at 12/18/2017 1813 Last data filed at 12/18/2017 1800 Gross per 24 hour  Intake 2379.08 ml  Output 2075 ml  Net 304.08 ml   LBM: Last BM Date: 01/08/2018 Baseline Weight: Weight: 89.6 kg (197 lb 8.5 oz) Most recent weight: Weight: 88.7 kg (195 lb 8.8 oz)       Palliative Assessment/Data:    Flowsheet Rows     Most Recent Value  Intake Tab  Referral Department  Hospitalist  Unit at Time of Referral  ICU  Palliative Care Primary Diagnosis  Cardiac  Date Notified  12/16/17  Palliative Care Type  Return patient Palliative Care  Reason for referral  Clarify Goals of Care  Date of Admission   12/16/17  Date first seen by Palliative Care  12/16/17  # of days Palliative referral response time  0 Day(s)  # of days IP prior to Palliative referral  0  Clinical Assessment  Palliative Performance Scale Score  30%  Psychosocial & Spiritual Assessment  Palliative Care Outcomes  Patient/Family meeting held?  No      Patient Active Problem List   Diagnosis Date Noted  . Acute metabolic encephalopathy   . Acute on chronic respiratory failure with hypoxemia ([place])   . Palliative care by specialist   . Arthralgia of both lower legs   . Hypokalemia   . Hyperkalemia 12/25/2017  . Pressure injury of skin 12/16/2017  . Palliative care encounter   . Goals of care, counseling/discussion   . Acute on chronic diastolic congestive heart failure ([place]) 09/09/2017  . Acute on chronic systolic heart failure ([place]) 08/25/2017  . CHF (congestive heart failure) ([place]) 07/19/2017  . Hyponatremia 04/02/2017  . Normocytic anemia 04/02/2017  . OA (osteoarthritis) 11/19/2016  . Paroxysmal atrial fibrillation ([place]) 09/10/2016  . CKD (chronic kidney disease), stage III ([place]) 09/10/2016  . Physical deconditioning 09/10/2016  . Cardiogenic shock ([place])   . Acute renal failure with acute renal cortical necrosis superimposed on stage 3 chronic kidney disease ([place]) 08/05/2016  . Single implantable cardioverter-defibrillator BSX    . Ventricular tachycardia-treated the ATP 01/29/2011  . Hx of CABG 04/17/2009  . Cardiomyopathy, ischemic 04/17/2009  . Acute on chronic systolic CHF (congestive heart failure) ([place]) 04/17/2009  . Insulin dependent diabetes mellitus ([place]) 04/12/2009  . Hyperlipidemia LDL goal <70 04/12/2009  . Essential hypertension 04/12/2009  . PUD 04/12/2009  . Rheumatoid arthritis ([place]) 04/12/2009    Palliative Care Assessment & Plan   Patient Profile: 83 y.o.malewith past medical history of ICM with EF 20-25% s/p AICD, CAD s/p CABG, HTN, RA, CKD III, who has multiple recent  hospitalizations for CHF and is now  readmitted on05/08/2019with cardiogenic shock requiring pressors.  Palliative care was consulted to help address goals. Patient's prognosis remains grim and although family is trying to maintain hope that he will go to [facility] nursing home they are beginning to recognize that he may be trending towards end-of-life secondary to the severity of his underlying heart disease   Recommendations/Plan:  Continue with current treatment plan unchanged for now  Family's goal is for patient to improve and be discharged to [facility] home for rehab to go back home; however, they are recognizing that this may not happen and that they may be looking at end-of-life, residential hospice in [place] to continue to monitor his situation and patient's progress or decline and assist with symptom management and disposition    Code Status:    Code Status Orders  (From admission, onward)        Start     Ordered   12/19/2017 1746  Limited resuscitation (code)  Continuous    Question Answer Comment  In the event of cardiac or respiratory ARREST: Initiate Code Blue, Call Rapid Response No   In the event of cardiac or respiratory ARREST: Perform CPR No   In the event of cardiac or respiratory ARREST: Perform Intubation/Mechanical Ventilation No   In the event of cardiac or respiratory ARREST: Use NIPPV/BiPAp only if indicated Yes   In the event of cardiac or respiratory ARREST: Administer ACLS medications if indicated No   In the event of cardiac or respiratory ARREST: Perform Defibrillation or Cardioversion if indicated No      12/30/2017 1745    Code Status History    Date Active Date Inactive Code Status Order ID Comments User Context   01/06/2018 1543 12/25/2017 1745 Full Code [id number]  Omar Person, NP Inpatient   08/25/2017 1920 08/27/2017 1941 Full Code [id number]  Conrad Persia, NP ED   07/20/2017 1710 07/22/2017 1821 Full Code  [id number]  Allie Bossier, MD ED   07/19/2017 2154 07/20/2017 1710 Full Code [id number]  Jani Gravel, MD ED   04/02/2017 0450 04/03/2017 1546 Full Code [id number]  Vianne Bulls, MD ED   08/23/2016 1730 09/03/2016 0309 Full Code [id number]  Consuelo Pandy, PA-C ED   01/25/2014 1605 01/25/2014 2005 Full Code [id number]  Deboraha Sprang, MD Inpatient       Prognosis:  < 6 months in the setting of end-stage heart failure, EF 20 to 25%, chronic kidney disease stage III atrial fib; multisystem organ failure.  Patient is at high risk for acute decompensation, thus making him potentially eligible for residential hospice, end-of-life care.   He is symptomatically worsening with worsening delirium requiring Precedex   Discharge Planning:  To Be Determined   Thank you for allowing the Palliative Medicine Team to assist in the care of this patient.   Time In: 1530 Time Out: 1600 Total Time 30 min Prolonged Time Billed  no       Greater than 50%  of this time was spent counseling and coordinating care related to the above assessment and plan.  Dory Horn, NP  Please contact Palliative Medicine Team phone at [phone] for questions and concerns.

## 2017-12-18 NOTE — H&P (Signed)
PULMONARY / CRITICAL CARE MEDICINE   Name: [name] MRN: [id number] DOB: 14-Sep-1934    ADMISSION DATE:  12/22/2017  REFERRING MD:  [facility] Linsey)   Interval Hx More confused today. He had a rough night was very agitated sundowning requiring precedex drip. Now asleep. Still on dobutamine drip and lasix drip    CHIEF COMPLAINT: Cardiogenic shock   HISTORY OF PRESENT ILLNESS:   83 year old male with history of CAD status post CABG (2000), hypertension, ischemic cardiomyopathy (EF 20 to 25% 07/20/2017), rheumatoid arthritis, stage III CKD, PAF on Eliquis, diabetes, chronic combined heart failure followed in heart failure clinic. With five admissions in last 10 months related to acute on chronic heart failure.   Presents to [facility] ED on 5/2 with progressive weakness and leg swelling. Upon arrival Patient was noted to be bradycardiac with multiple PVCs, and K of 7.4. Crt 2.5. AST/ALT 1011/629. Treated with Bicarb/Insulin/Dextrose and transferred to [facility] for further management.   Patient states that on 5/1 he had a fall while in was transferring from chair to wheelchair. At baseline he is wheelchair bound due to swelling and weakness in lower extremities. Voices that his son help with his medical decisions and he lives with grand-daughter and her children.   PAST MEDICAL HISTORY :  He  has a past medical history of AICD (automatic cardioverter/defibrillator) present, Arthritis, Congestive heart failure, unspecified, Coronary atherosclerosis of unspecified type of vessel, native or graft, Hypothyroidism, Ischemic cardiomyopathy, Myocardial infarction ([place]) (?2005), Other and unspecified hyperlipidemia, Peptic ulcer, unspecified site, unspecified as acute or chronic, without mention of hemorrhage, perforation, or obstruction, RA (rheumatoid arthritis) ([place]), Type II diabetes mellitus ([place]), Unspecified essential hypertension, and Ventricular tachycardia ([place]).  PAST SURGICAL HISTORY: He   has a past surgical history that includes implantable cardioverter defibrillator (icd) generator change (N/A, 01/25/2014); Cardiac catheterization (N/A, 09/01/2016); Carpal tunnel release (Left, 05/2008); Carpal tunnel release (Right, 03/2010); Inguinal hernia repair (Right); and Coronary artery bypass graft (2000).  Allergies  Allergen Reactions  . Metformin And Related Other (See Comments)    "Made sugar go wild"   . Rivastigmine Nausea And Vomiting and Other (See Comments)    NO EXELON PATCHES!!  . Penicillins Rash    Details unknown    No current facility-administered medications on file prior to encounter.    Current Outpatient Medications on File Prior to Encounter  Medication Sig  . acetaminophen (TYLENOL) 325 MG tablet Take 650 mg by mouth every 6 (six) hours as needed for mild pain or moderate pain.   Marland Kitchen amiodarone (PACERONE) 200 MG tablet Take 1 tablet (200 mg total) by mouth daily.  Marland Kitchen apixaban (ELIQUIS) 2.5 MG TABS tablet Take 1 tablet (2.5 mg total) 2 (two) times daily by mouth.  Marland Kitchen aspirin 81 MG chewable tablet Chew 81 mg by mouth daily.   Marland Kitchen atorvastatin (LIPITOR) 10 MG tablet Take 10 mg by mouth at bedtime.   . carvedilol (COREG) 3.125 MG tablet Take 1 tablet (3.125 mg total) by mouth 2 (two) times daily with a meal.  . digoxin (LANOXIN) 0.125 MG tablet Take 0.5 tablets (0.0625 mg total) by mouth every other day.  . esomeprazole (NEXIUM) 40 MG capsule Take 40 mg by mouth daily.    Marland Kitchen glyBURIDE (DIABETA) 5 MG tablet Take 5 mg by mouth daily with breakfast.  . HUMULIN R 100 UNIT/ML injection USE VIA PUMP AS DIRECTED WITH V-GO MACHINE AS DIRECTED. MAX DD OF 66 UNITS  . Insulin Disposable Pump (V-GO 30) KIT  daily.   . levothyroxine (SYNTHROID, LEVOTHROID) 75 MCG tablet Take 1 tablet (75 mcg total) by mouth daily before breakfast.  . metolazone (ZAROXOLYN) 2.5 MG tablet Take 1 tablet (2.5 mg total) by mouth once a week. Take every Saturday with extra 20 meq of Potassium and as  prescribed  . potassium chloride SA (K-DUR,KLOR-CON) 20 MEQ tablet Take 2 tablets (40 mEq total) by mouth 2 (two) times daily.  Marland Kitchen spironolactone (ALDACTONE) 25 MG tablet Take 0.5 tablets (12.5 mg total) by mouth daily.  Marland Kitchen torsemide (DEMADEX) 100 MG tablet Take 1 tablet (100 mg total) by mouth 2 (two) times daily.  . vitamin C (ASCORBIC ACID) 500 MG tablet Take 500 mg by mouth 2 (two) times daily.  . polyethylene glycol (MIRALAX / GLYCOLAX) packet Take 17 g by mouth daily as needed for mild constipation.     FAMILY HISTORY:  His indicated that his mother is deceased. He indicated that his father is deceased. He indicated that the status of his sister is unknown. He indicated that the status of his brother is unknown. He indicated that his maternal grandmother is deceased. He indicated that his maternal grandfather is deceased. He indicated that his paternal grandmother is deceased. He indicated that his paternal grandfather is deceased.   SOCIAL HISTORY: He  reports that he has never smoked. He has never used smokeless tobacco. He reports that he does not drink alcohol or use drugs.  REVIEW OF SYSTEMS:   All negative; except for those that are bolded, which indicate positives.  Constitutional: weight loss, weight gain, night sweats, fevers, chills, fatigue, weakness.  HEENT: headaches, sore throat, sneezing, nasal congestion, post nasal drip, difficulty swallowing, tooth/dental problems, visual complaints, visual changes, ear aches. Neuro: difficulty with speech, weakness, numbness, ataxia. CV:  chest pain, orthopnea, PND, swelling in lower extremities, dizziness, palpitations, syncope.  Resp: cough, hemoptysis, dyspnea, wheezing. GI: heartburn, indigestion, abdominal pain, nausea, vomiting, diarrhea, constipation, change in bowel habits, loss of appetite, hematemesis, melena, hematochezia.  GU: dysuria, change in color of urine, urgency or frequency, flank pain, hematuria. MSK: joint pain or  swelling, decreased range of motion. Psych: change in mood or affect, depression, anxiety, suicidal ideations, homicidal ideations. Skin: rash, itching, bruising.   SUBJECTIVE:    VITAL SIGNS: BP 110/70   Pulse 73   Temp (!) 97.5 F (36.4 C) (Axillary)   Resp 11   Ht _0  (1.778 m)   Wt 88.7 kg (195 lb 8.8 oz)   SpO2 98%   BMI 28.06 kg/m   HEMODYNAMICS: CVP:  [12 mmHg-54 mmHg] 12 mmHg On dobutamine    VENTILATOR SETTINGS:    INTAKE / OUTPUT: I/O last 3 completed shifts: In: 2714.9 [P.O.:1270; I.V.:1044.9; IV Piggyback:400] Out: 3105 [Urine:3105]  PHYSICAL EXAMINATION: General:  Chronically ill appearing elderly male, no distress  Neuro:  Asleep after having arough night. On precedex drip  HEENT:  MMM Cardiovascular:  Irregular, no MRG Lungs:  Diminished to bases, no wheeze, non-labored  Abdomen:  Soft, non-tender  Musculoskeletal:  +3 BLE edema  Skin:  Multiple skin abrasions and bruising noted to all extremities   LABS:  BMET Recent Labs  Lab 12/16/17 1356 12/17/17 0341 12/17/17 1614 12/18/17 0414  NA 126* 126* 124* 122*  K 3.8 3.0* 3.4* 4.9  CL 84* 85* 82* 85*  CO2 28 29  --  28  BUN 49* 43* 36* 41*  CREATININE 2.49* 2.24* 2.20* 2.09*  GLUCOSE 204* 97 171* 95    Electrolytes Recent  Labs  Lab 12/25/2017 1601 12/16/17 0345 12/16/17 1356 12/17/17 0341 12/18/17 0414  CALCIUM 9.2 8.5* 8.2* 8.1* 7.9*  MG 1.9 1.6*  --  1.8  --   PHOS 4.4 4.5  --   --   --     CBC Recent Labs  Lab 12/17/17 0341 12/17/17 1232 12/17/17 1614 12/18/17 0414  WBC 5.4 5.7  --  4.6  HGB 7.8* 7.8* 8.2* 7.6*  HCT 24.1* 24.4* 24.0* 25.0*  PLT 160 142*  --  215    Coag's Recent Labs  Lab 12/16/17 0345 12/16/17 1515 12/17/17 1115  APTT 145* 115*  --   INR  --   --  1.39    Sepsis Markers Recent Labs  Lab 01/13/2018 1601 12/16/17 0345 12/17/17 0341  PROCALCITON 0.24 0.44 0.39    ABG No results for input(s): PHART, PCO2ART, PO2ART in the last 168  hours.  Liver Enzymes Recent Labs  Lab 12/16/17 0345 12/17/17 0341 12/18/17 0414  AST 1,074* 550* 282*  ALT 875* 767* 543*  ALKPHOS 195* 176* 150*  BILITOT 1.7* 1.6* 1.9*  ALBUMIN 3.0* 2.9* 2.9*    Cardiac Enzymes Recent Labs  Lab 01/12/2018 1601 12/16/17 0345  TROPONINI 0.06* 0.06*    Glucose Recent Labs  Lab 12/17/17 1131 12/17/17 1606 12/17/17 2044 12/18/17 0058 12/18/17 0442 12/18/17 0715  GLUCAP 118* 186* 193* 136* 97 111*    Imaging No results found.   STUDIES:  CXR 5/2 >>   CULTURES: None.   ANTIBIOTICS: None.   SIGNIFICANT EVENTS: 5/2 > Arrived to Hospital   LINES/TUBES: PIV   DISCUSSION: 83 year old male in cardiogenic shock presents hyperkalemic and bradycardiac. Transferred from [place] to [facility] for further management.   ASSESSMENT / PLAN:  PULMONARY A: No issues  P:   Maintain Oxygen Saturation >92 On room air   Pulmonary Hygiene  DNI, BiPAP Okay   CARDIOVASCULAR A:  Cardiogenic Shock in setting of decompensated chronic systolic/diastolic HF (EF 81-19) vs Cardiorenal syndrome  H/O CAD s/p CABG, HTN, HLD, PAF on Eliquis  P:  Continue dobutamine as per cards Increase lasix drip to 47m/hr as per cards  Cardiac Monitoring  On  amio  We appreciate Heart Failure service follow up  Continue ASA Heparin gtt (hold eliquis)  Hold Lipitor, Coreg, Dig, Spirolactone, Torsemide  Trend Troponin   RENAL A:   Acute on Chronic Kidney Disease Stage 4 (Baseline Crt 1.7-1.8)  Hyperkalemia resolved  Hypokalemia  Per Family has received Multiple does of K from cardiology office for hypokalemia over last 2 days as well as 2 doses of metolazone, also takes spironolactone  Hyponatremia  P:   replace electrolytes  Sodium is dropping  Trend BMP kidney function improving  Lasix gtt as above   GASTROINTESTINAL A:   Transaminase in setting of cardiogenic shock  Shocked liver  P:   Cardiac diet   Fluid restriction  LFT    HEMATOLOGIC A:   Anticoagulation needs in setting of PAF Anemia of Chronic Disease  P:  Trend CBC  Heparin gtt as above   INFECTIOUS A:   No issues  P:   Trend WBC and Fever Curve Trend PCT   ENDOCRINE A:   DM   P:   Trend Glucose  SSI   NEUROLOGIC A:   Memory Loss (Family reports dementia)   Multiple Fall secondary to weakness ICU delirium   P:   precedex drip titration  Monitor  PT/OT > May need SNF placement,  currently leaves with grand-daughter    FAMILY  - Updates: no family available    - Inter-disciplinary family meet or Palliative Care meeting due by: 12/22/2017 PATIENT IS DNR

## 2017-12-18 NOTE — Progress Notes (Signed)
Pharmacist Heart Failure Core Measure Documentation  Assessment: [name] has an EF documented as 25% on 12/16/17 by ECHO.  Rationale: Heart failure patients with left ventricular systolic dysfunction (LVSD) and an EF < 40% should be prescribed an angiotensin converting enzyme inhibitor (ACEI) or angiotensin receptor blocker (ARB) at discharge unless a contraindication is documented in the medical record.  This patient is not currently on an ACEI or ARB for HF.  This note is being placed in the record in order to provide documentation that a contraindication to the use of these agents is present for this encounter.  ACE Inhibitor or Angiotensin Receptor Blocker is contraindicated (specify all that apply)  []   ACEI allergy AND ARB allergy []   Angioedema []   Moderate or severe aortic stenosis []   Hyperkalemia [x]   Hypotension []   Renal artery stenosis [x]   Worsening renal function, preexisting renal disease or dysfunction   12/18/2017 1:52 PM

## 2017-12-19 ENCOUNTER — Encounter (HOSPITAL_COMMUNITY): Payer: Self-pay

## 2017-12-19 ENCOUNTER — Inpatient Hospital Stay (HOSPITAL_COMMUNITY): Payer: Medicare Other

## 2017-12-19 LAB — GLUCOSE, CAPILLARY
GLUCOSE-CAPILLARY: 124 mg/dL — AB (ref 65–99)
GLUCOSE-CAPILLARY: 164 mg/dL — AB (ref 65–99)
GLUCOSE-CAPILLARY: 166 mg/dL — AB (ref 65–99)
Glucose-Capillary: 154 mg/dL — ABNORMAL HIGH (ref 65–99)
Glucose-Capillary: 224 mg/dL — ABNORMAL HIGH (ref 65–99)
Glucose-Capillary: 193 mg/dL — ABNORMAL HIGH (ref 65–99)

## 2017-12-19 LAB — COOXEMETRY PANEL
CARBOXYHEMOGLOBIN: 1.8 % — AB (ref 0.5–1.5)
Carboxyhemoglobin: 1.5 % (ref 0.5–1.5)
Methemoglobin: 0.7 % (ref 0.0–1.5)
Methemoglobin: 1.4 % (ref 0.0–1.5)
O2 SAT: 43.6 %
O2 Saturation: 49.6 %
TOTAL HEMOGLOBIN: 13.7 g/dL (ref 12.0–16.0)
Total hemoglobin: 10.3 g/dL — ABNORMAL LOW (ref 12.0–16.0)

## 2017-12-19 LAB — BPAM RBC
BLOOD PRODUCT EXPIRATION DATE: 201905282359
ISSUE DATE / TIME: 201905050931
UNIT TYPE AND RH: 5100

## 2017-12-19 LAB — CBC WITH DIFFERENTIAL/PLATELET
BASOS PCT: 0 %
Basophils Absolute: 0 10*3/uL (ref 0.0–0.1)
EOS ABS: 0.1 10*3/uL (ref 0.0–0.7)
Eosinophils Relative: 2 %
HCT: 28.7 % — ABNORMAL LOW (ref 39.0–52.0)
Hemoglobin: 9.2 g/dL — ABNORMAL LOW (ref 13.0–17.0)
LYMPHS ABS: 0.7 10*3/uL (ref 0.7–4.0)
Lymphocytes Relative: 15 %
MCH: 25.6 pg — AB (ref 26.0–34.0)
MCHC: 32.1 g/dL (ref 30.0–36.0)
MCV: 79.7 fL (ref 78.0–100.0)
MONO ABS: 0 10*3/uL — AB (ref 0.1–1.0)
MONOS PCT: 0 %
Neutro Abs: 3.9 10*3/uL (ref 1.7–7.7)
Neutrophils Relative %: 83 %
Platelets: 144 10*3/uL — ABNORMAL LOW (ref 150–400)
RBC: 3.6 MIL/uL — ABNORMAL LOW (ref 4.22–5.81)
RDW: 19 % — ABNORMAL HIGH (ref 11.5–15.5)
WBC: 4.7 10*3/uL (ref 4.0–10.5)

## 2017-12-19 LAB — COMPREHENSIVE METABOLIC PANEL
ALBUMIN: 3 g/dL — AB (ref 3.5–5.0)
ALT: 449 U/L — ABNORMAL HIGH (ref 17–63)
AST: 139 U/L — ABNORMAL HIGH (ref 15–41)
Alkaline Phosphatase: 154 U/L — ABNORMAL HIGH (ref 38–126)
Anion gap: 11 (ref 5–15)
BILIRUBIN TOTAL: 1.7 mg/dL — AB (ref 0.3–1.2)
BUN: 35 mg/dL — ABNORMAL HIGH (ref 6–20)
CO2: 30 mmol/L (ref 22–32)
Calcium: 8.3 mg/dL — ABNORMAL LOW (ref 8.9–10.3)
Chloride: 87 mmol/L — ABNORMAL LOW (ref 101–111)
Creatinine, Ser: 1.91 mg/dL — ABNORMAL HIGH (ref 0.61–1.24)
GFR calc Af Amer: 37 mL/min — ABNORMAL LOW (ref >60)
GFR calc non Af Amer: 32 mL/min — ABNORMAL LOW (ref >60)
GLUCOSE: 172 mg/dL — AB (ref 65–99)
POTASSIUM: 4 mmol/L (ref 3.5–5.1)
Sodium: 128 mmol/L — ABNORMAL LOW (ref 135–145)
TOTAL PROTEIN: 6.5 g/dL (ref 6.5–8.1)

## 2017-12-19 LAB — TYPE AND SCREEN
ABO/RH(D): O POS
ANTIBODY SCREEN: NEGATIVE
Unit division: 0

## 2017-12-19 LAB — HEPARIN LEVEL (UNFRACTIONATED): HEPARIN UNFRACTIONATED: 0.33 [IU]/mL (ref 0.30–0.70)

## 2017-12-19 LAB — FOLATE RBC
Folate, Hemolysate: 523.1 ng/mL
Folate, RBC: 2189 ng/mL (ref >498)
HEMATOCRIT: 23.9 % — AB (ref 37.5–51.0)

## 2017-12-19 MED ORDER — SODIUM CHLORIDE 0.9 % IV SOLN
510.0000 mg | Freq: Once | INTRAVENOUS | Status: AC
  Administered 2017-12-19: 510 mg via INTRAVENOUS
  Filled 2017-12-19: qty 17

## 2017-12-19 MED ORDER — TOLVAPTAN 15 MG PO TABS
15.0000 mg | ORAL_TABLET | Freq: Once | ORAL | Status: AC
  Administered 2017-12-19: 15 mg via ORAL
  Filled 2017-12-19: qty 1

## 2017-12-19 MED ORDER — STERILE WATER FOR INJECTION IV SOLN: INTRAVENOUS | Status: DC

## 2017-12-19 NOTE — Progress Notes (Signed)
ANTICOAGULATION CONSULT NOTE   Pharmacy Consult for Heparin (Eliquis on hold) Indication: atrial fibrillation   Labs: Recent Labs    12/16/17 1515  12/17/17 1115 12/17/17 1232  12/18/17 0414 12/18/17 0933 12/18/17 1433 12/19/17 0359 12/19/17 0406  HGB  --    < >  --  7.8*   < > 7.6*  --  9.1*  --  9.2*  HCT  --    < >  --  24.4*   < > 25.0*  --  28.9*  --  28.7*  PLT  --    < >  --  142*  --  215  --   --   --  144*  APTT 115*  --   --   --   --   --   --   --   --   --   LABPROT  --   --  16.9*  --   --   --   --   --   --   --   INR  --   --  1.39  --   --   --   --   --   --   --   HEPARINUNFRC 1.24*   < > 0.63  --   --   --  0.47  --  0.33  --   CREATININE  --    < >  --   --    < > 2.09*  --  1.98*  --  1.91*   < > = values in this interval not displayed.     Assessment:  83 yr old male to begin IV heparin for atrial fibrillation.  Eliquis PTA with last dose reported ~8am 5/2. Transferred to [facility] from [facility] with cardiogenic shock vs cardiorenal syndrome.    Heparin level now at goal, no overt bleeding or complications noted. Pltc down but consistent with prior values.     Goal of Therapy:  Heparin level 0.3-0.7 units/ml Monitor platelets by anticoagulation protocol: Yes   Plan:  -Continue IV Heparin at 800 units/hr  -Daily heparin level and CBC -F/u plans to restart Eliquis as appropriate  Fredonia Highland, PharmD, BCPS PGY-2 Cardiology Pharmacy Resident Pager: [phone] 12/19/2017

## 2017-12-19 NOTE — Progress Notes (Addendum)
Patient ID: [name], male   DOB: 10-17-1934, 83 y.o.   MRN: [id number]     Advanced Heart Failure Rounding Note  PCP-Cardiologist: Olga Millers, MD   Subjective:    Admitted with volume overload. He was in cardiogenic shock.   Started on milrinone but later switched to dobutamine due to hypotension. Norepi later added. Norepinephrine weaned off on 5/3.  Coox 49.6% this am on dobutamine 5. Not sure accurate with stable BP and improvement of Hgb.   Cr stable at 1.91. Weight down 6 lbs. CVP ~10 cm  More alert this am. Knows which hospital he is at. Denies SOB. Fatigued and weak.   Hemoglobin up to 9.2 after 1 uPRBCs  Na 122 -> 128 with dose of Tolvaptan.   Objective:   Weight Range: 189 lb 13.1 oz (86.1 kg) Body mass index is 27.24 kg/m.   Vital Signs:   Temp:  [96.4 F (35.8 C)-97.6 F (36.4 C)] 97.4 F (36.3 C) (05/06 0000) Pulse Rate:  [66-75] 73 (05/06 0700) Resp:  [10-19] 12 (05/06 0700) BP: (90-120)/(56-78) 102/60 (05/06 0700) SpO2:  [95 %-100 %] 97 % (05/06 0700) Weight:  [189 lb 13.1 oz (86.1 kg)] 189 lb 13.1 oz (86.1 kg) (05/06 0400) Last BM Date: 12/18/17  Weight change: Filed Weights   12/17/17 0600 12/18/17 0435 12/19/17 0400  Weight: 192 lb 3.9 oz (87.2 kg) 195 lb 8.8 oz (88.7 kg) 189 lb 13.1 oz (86.1 kg)    Intake/Output:   Intake/Output Summary (Last 24 hours) at 12/19/2017 0715 Last data filed at 12/19/2017 0600 Gross per 24 hour  Intake 2600.98 ml  Output 5125 ml  Net -2524.02 ml    Physical Exam   CVP 10  General: NAD  HEENT: Normal Neck: Supple. JVP 10-11 cm Carotids 2+ bilat; no bruits. No thyromegaly or nodule noted. Cor: PMI nondisplaced. RRR, No M/G/R noted Lungs: CTAB, normal effort. Abdomen: Soft, non-tender, non-distended, no HSM. No bruits or masses. +BS  Extremities: No cyanosis, clubbing, or rash. Trace to 1+ edema to 1/2 way to knees.  Neuro: Confused.   Telemetry   NSR 70-80s, personally reviewed.   EKG    No new  tracings.   Labs    CBC Recent Labs    12/18/17 0414 12/18/17 1433 12/19/17 0406  WBC 4.6  --  4.7  NEUTROABS 3.6  --  3.9  HGB 7.6* 9.1* 9.2*  HCT 25.0* 28.9* 28.7*  MCV 83.9  --  79.7  PLT 215  --  144*   Basic Metabolic Panel Recent Labs    66/59/93 0341  12/18/17 1433 12/19/17 0406  NA 126*   < > 126* 128*  K 3.0*   < > 3.8 4.0  CL 85*   < > 87* 87*  CO2 29   < > 29 30  GLUCOSE 97   < > 154* 172*  BUN 43*   < > 37* 35*  CREATININE 2.24*   < > 1.98* 1.91*  CALCIUM 8.1*   < > 8.4* 8.3*  MG 1.8  --   --   --    < > = values in this interval not displayed.   Liver Function Tests Recent Labs    12/18/17 0414 12/19/17 0406  AST 282* 139*  ALT 543* 449*  ALKPHOS 150* 154*  BILITOT 1.9* 1.7*  PROT 6.0* 6.5  ALBUMIN 2.9* 3.0*   No results for input(s): LIPASE, AMYLASE in the last 72 hours. Cardiac Enzymes No results  for input(s): CKTOTAL, CKMB, CKMBINDEX, TROPONINI in the last 72 hours.  BNP: BNP (last 3 results) Recent Labs    08/25/17 1140 09/09/17 0525 12/16/17 0345  BNP 1,102.9* 1,105.4* 1,012.3*    ProBNP (last 3 results) No results for input(s): PROBNP in the last 8760 hours.   D-Dimer No results for input(s): DDIMER in the last 72 hours. Hemoglobin A1C No results for input(s): HGBA1C in the last 72 hours. Fasting Lipid Panel No results for input(s): CHOL, HDL, LDLCALC, TRIG, CHOLHDL, LDLDIRECT in the last 72 hours. Thyroid Function Tests No results for input(s): TSH, T4TOTAL, T3FREE, THYROIDAB in the last 72 hours.  Invalid input(s): FREET3  Other results:   Imaging    No results found.   Medications:     Scheduled Medications: . amiodarone  200 mg Oral Daily  . aspirin  81 mg Oral Daily  . Chlorhexidine Gluconate Cloth  6 each Topical Daily  . insulin aspart  0-15 Units Subcutaneous Q4H  . levothyroxine  75 mcg Oral QAC breakfast  . mirtazapine  15 mg Oral Once  . mupirocin ointment  1 application Nasal BID  . potassium  chloride  40 mEq Oral BID  . sodium chloride flush  10-40 mL Intracatheter Q12H    Infusions: . sodium chloride Stopped (12/17/17 1000)  . dexmedetomidine (PRECEDEX) IV infusion 0.6 mcg/kg/hr (12/19/17 0600)  . DOBUTamine 5 mcg/kg/min (12/19/17 0600)  . furosemide (LASIX) infusion 15 mg/hr (12/19/17 0600)  . heparin 800 Units/hr (12/19/17 0600)  . norepinephrine (LEVOPHED) Adult infusion Stopped (12/16/17 1225)    PRN Medications: sodium chloride, acetaminophen, bisacodyl, sodium chloride flush, traMADol    Patient Profile   Javontay Vandam Jonesis a 82 y.o.malewith CAD s/p CABG, hypertension, hyperlipidemia and chronic systolic and diastolic heart failure presenting today with sob at rest and increased leg edema.    Assessment/Plan   1. Acute on chronic systolic CHF with cardiogenic shock: EF 20-25% in 12/18, ischemic cardiomyopathy.  Echo this admission with EF 25%, mild MR, mildly dilated RV, PASP 57 mmHg. - Coox 49.6% this am dobutamine 5 and Lasix 15 mg/hr. Will recheck coox.  - CVP ~10-11. Keep lasix going at least this am.  - Home spironolactone, Coreg, and digoxin on hold for now => creatinine at baseline, can restart digoxin.   2. CAD: s/p CABG.  Mild TnI elevation likely demand ischemia.  Coronary angiography 1/18 with patent graft and no targets for revascularization. - Statin held for now with shock liver => can restart with downtrend in LFTs.   - Getting ASA 81 daily.  - No change to current plan.   3. Atrial fibrillation: Paroxysmal.   - Remains in NSR.  - Can continue amiodarone 200 daily to keep in NSR, elevated LFTs from shock liver and trending down.  - Currently on heparin gtt, continue for now but can restart Eliquis eventually.  Hemoglobin down but no overt bleeding.  4. Elevated LFTs:  - Suspect shock liver. Support cardiac output.  - LFTs trending down.  5. Hyponatremia: Hypervolemic hyponatremia. - 122 - > 128 after dose of Tolvaptan.  Will give 1 more  dose today.  6. Delirium: - Worse 5/5 in setting of worsening hyponatremia, now improved.   - Reinforced fluid restriction.  - Na improved to 128 with dose of tolvaptan.  - On Precedex.   7. AKI: Suspect cardiorenal with cardiogenic shock.  - Creatinine 1.91 today. Baseline creatinine around 2.0.  8. Code status: DNR/DNI.  Palliative care came by yesterday.  Hospice discussions begun, son not ready yet to turn off ICD.  Will definitely need SNF at discharge (will be unable to return home).   - No change to current plan.   9. Hypokalemia:  - Resolved. K 4.0 today.  10. Anemia:  - Hgb 7.6 -> 9.2. No sign of overt bleeding.  Suspect chronic disease, renal disease play a role.   - Iron and Iron sat low => will give dose of feraheme.  - FOBT pending.  Graciella Freer, PA-C  12/19/2017 7:15 AM   Advanced Heart Failure Team Pager [phone] (M-F; 7a - 4p)  Please contact [facility] for night-coverage after hours (4p -7a ) and weekends on [email or web address]  Patient seen with PA, agree with the above note.  He has made some improvement, much more alert/oriented today.  Co-ox remains low but good UOP and weight down.  Appropriate hgb rise with 1 unit PRBCs.  CVP 10.   On exam, JVP 8-9, regular rhythm, decreased breath sounds at bases, trace ankle edema.  Alert, oriented to place/person.   Continue current dobutamine, will repeat co-ox.  Can restart digoxin as creatinine now at baseline.  - I am concerned he may not be able to come off the dobutamine and maintain cardiac output.  Palliative care following, will need discussion of home hospice versus home dobutamine potentially, will watch over the next couple of days.  - Continue Lasix 15 mg/hr for now, will add dose of tolvaptan with Na still low. Probably hold gtt later today or tomorrow.   He is much more alert this morning, probably with improving volume and sodium level.   Hgb to 9.2 with 1 unit PRBCs.  Fe low, will give feraheme.    Marca Ancona 12/19/2017 7:52 AM

## 2017-12-19 NOTE — Progress Notes (Signed)
Daily Progress Note   Patient Name: [name]       Date: 12/19/2017 DOB: [birth year]  Age: 83 y.o. MRN#: [id number] Attending Physician: Coralyn Helling, MD Primary Care Physician: [name], MD Admit Date: 12/16/2017  Reason for Consultation/Follow-up: Establishing goals of care  Subjective: Patient in bed. Asking for water. His nurse tells me he is on fluid restrictions and cannot have any more water. He tells me he knows he is very sick, that he is in the hospital because of his heart. He is oriented to person and place, however, after I explain to him the reason for not having water he quickly forgets and asks again. I do not think he is able to participate in GOC- he is also on precedex infusion. There is no family at bedside. Note low BP when dobutamine is attempted to be weaned.    Review of Systems  Unable to perform ROS: Dementia    Length of Stay: 4  Current Medications: Scheduled Meds:  . amiodarone  200 mg Oral Daily  . aspirin  81 mg Oral Daily  . Chlorhexidine Gluconate Cloth  6 each Topical Daily  . insulin aspart  0-15 Units Subcutaneous Q4H  . levothyroxine  75 mcg Oral QAC breakfast  . mirtazapine  15 mg Oral Once  . mupirocin ointment  1 application Nasal BID  . potassium chloride  40 mEq Oral BID  . sodium chloride flush  10-40 mL Intracatheter Q12H    Continuous Infusions: . sodium chloride Stopped (12/17/17 1000)  . dexmedetomidine (PRECEDEX) IV infusion 0.401 mcg/kg/hr (12/19/17 1500)  . DOBUTamine 4.018 mcg/kg/min (12/19/17 1500)  . furosemide (LASIX) infusion 15 mg/hr (12/19/17 1500)  . heparin 800 Units/hr (12/19/17 1500)    PRN Meds: sodium chloride, acetaminophen, bisacodyl, sodium chloride flush, traMADol  Physical Exam  Constitutional: He  appears well-developed and well-nourished.  Cardiovascular: Normal rate and regular rhythm.  Pulmonary/Chest: Effort normal. No respiratory distress.  Abdominal: Soft.  Neurological: He is alert.  Nursing note and vitals reviewed.           Vital Signs: BP 97/60   Pulse 74   Temp (!) 97.5 F (36.4 C) (Oral)   Resp 12   Ht 5\' 10"  (1.778 m)   Wt 86.1 kg (189 lb 13.1 oz)   SpO2  98%   BMI 27.24 kg/m  SpO2: SpO2: 98 % O2 Device: O2 Device: Room Air O2 Flow Rate:    Intake/output summary:   Intake/Output Summary (Last 24 hours) at 12/19/2017 1613 Last data filed at 12/19/2017 1500 Gross per 24 hour  Intake 2713.3 ml  Output 5750 ml  Net -3036.7 ml   LBM: Last BM Date: 12/18/17 Baseline Weight: Weight: 89.6 kg (197 lb 8.5 oz) Most recent weight: Weight: 86.1 kg (189 lb 13.1 oz)       Palliative Assessment/Data: PPS: 20%    Flowsheet Rows     Most Recent Value  Intake Tab  Referral Department  Hospitalist  Unit at Time of Referral  ICU  Palliative Care Primary Diagnosis  Cardiac  Date Notified  12/16/17  Palliative Care Type  Return patient Palliative Care  Reason for referral  Clarify Goals of Care  Date of Admission  12/16/17  Date first seen by Palliative Care  12/16/17  # of days Palliative referral response time  0 Day(s)  # of days IP prior to Palliative referral  0  Clinical Assessment  Palliative Performance Scale Score  30%  Psychosocial & Spiritual Assessment  Palliative Care Outcomes  Patient/Family meeting held?  No      Patient Active Problem List   Diagnosis Date Noted  . Acute metabolic encephalopathy   . Agitation requiring sedation protocol   . Acute on chronic respiratory failure with hypoxemia (HCC)   . Palliative care by specialist   . Arthralgia of both lower legs   . Hypokalemia   . Hyperkalemia Jan 11, 2018  . Pressure injury of skin 01/11/18  . Palliative care encounter   . Goals of care, counseling/discussion   . Acute on chronic  diastolic congestive heart failure (HCC) 09/09/2017  . Acute on chronic systolic heart failure (HCC) 08/25/2017  . CHF (congestive heart failure) (HCC) 07/19/2017  . Hyponatremia 04/02/2017  . Normocytic anemia 04/02/2017  . OA (osteoarthritis) 11/19/2016  . Paroxysmal atrial fibrillation (HCC) 09/10/2016  . CKD (chronic kidney disease), stage III (HCC) 09/10/2016  . Physical deconditioning 09/10/2016  . Cardiogenic shock (HCC)   . Acute renal failure with acute renal cortical necrosis superimposed on stage 3 chronic kidney disease (HCC) 08/05/2016  . Single implantable cardioverter-defibrillator BSX    . Ventricular tachycardia-treated the ATP 01/29/2011  . Hx of CABG 04/17/2009  . Cardiomyopathy, ischemic 04/17/2009  . Acute on chronic systolic CHF (congestive heart failure) (HCC) 04/17/2009  . Insulin dependent diabetes mellitus (HCC) 04/12/2009  . Hyperlipidemia LDL goal <70 04/12/2009  . Essential hypertension 04/12/2009  . PUD 04/12/2009  . Rheumatoid arthritis (HCC) 04/12/2009    Palliative Care Assessment & Plan   Patient Profile: 83 y.o. male  with past medical history of ICM with EF 20-25% s/p AICD, CAD s/p CABG, HTN, RA, CKD III, who has multiple recent hospitalizations for CHF and is now readmitted on Jan 11, 2018 with cardiogenic shock requiring pressors. Palliative care was consulted to help address goals.    Assessment/Recommendations/Plan   Continue current level of care  PMT will continue to follow- will call [name] for continued support   Goals of Care and Additional Recommendations:  Limitations on Scope of Treatment: Full Scope Treatment  Code Status:  Limited code - Bipap ok  Prognosis:   Unable to determine  Discharge Planning:  To Be Determined  Thank you for allowing the Palliative Medicine Team to assist in the care of this patient.   Time In: 1500 Time Out:  1535 Total Time 35 mins Prolonged Time Billed No      Greater than 50%  of this  time was spent counseling and coordinating care related to the above assessment and plan.  Ocie Bob, AGNP-C Palliative Medicine   Please contact Palliative Medicine Team phone at [phone] for questions and concerns.

## 2017-12-19 NOTE — Progress Notes (Signed)
PCCM Progress Note  Admission date: 01/12/2018 Referring provider: Duke Salvia CC: Weakness  HPI: 83 yo male with recurrent admissions for CHF presented to [facility] with weakness and swelling with bradycardia. PMHx of CAD s/p CABG, HTN, systolic CHF with EF 20 to 25%, RA, Stage 3 CKD, PAS on eliquis, DM, hypothyroidism.  Subjective: Anxious to go home.  Denies chest pain, nausea, dyspnea.  Vital signs: BP 102/60   Pulse 73   Temp (!) 97.4 F (36.3 C) (Oral)   Resp 12   Ht 5\' 10"  (1.778 m)   Wt 189 lb 13.1 oz (86.1 kg)   SpO2 97%   BMI 27.24 kg/m   Intake/outpt: I/O last 3 completed shifts: In: 3348.1 [P.O.:1711; I.V.:1322.1; Blood:315] Out: 5825 [Urine:5825]  Physical exam:  General - pleasant Eyes - pupils reactive ENT - no sinus tenderness, no oral exudate, no LAN Cardiac - regular, no murmur Chest - no wheeze, rales Abd - soft, non tender Ext - no edema Skin - no rashes Neuro - normal strength Psych - normal mood   CBC Recent Labs    12/17/17 1232  12/18/17 0414 12/18/17 1433 12/19/17 0406  WBC 5.7  --  4.6  --  4.7  HGB 7.8*   < > 7.6* 9.1* 9.2*  HCT 24.4*   < > 25.0* 28.9* 28.7*  PLT 142*  --  215  --  144*   < > = values in this interval not displayed.    Coag's Recent Labs    12/16/17 1515 12/17/17 1115  APTT 115*  --   INR  --  1.39    BMET Recent Labs    12/18/17 0414 12/18/17 1433 12/19/17 0406  NA 122* 126* 128*  K 4.9 3.8 4.0  CL 85* 87* 87*  CO2 28 29 30   BUN 41* 37* 35*  CREATININE 2.09* 1.98* 1.91*  GLUCOSE 95 154* 172*    Electrolytes Recent Labs    12/17/17 0341 12/18/17 0414 12/18/17 1433 12/19/17 0406  CALCIUM 8.1* 7.9* 8.4* 8.3*  MG 1.8  --   --   --     Sepsis Markers Recent Labs    12/17/17 0341  PROCALCITON 0.39    ABG No results for input(s): PHART, PCO2ART, PO2ART in the last 72 hours.  Liver Enzymes Recent Labs    12/17/17 0341 12/18/17 0414 12/19/17 0406  AST 550* 282* 139*  ALT 767*  543* 449*  ALKPHOS 176* 150* 154*  BILITOT 1.6* 1.9* 1.7*  ALBUMIN 2.9* 2.9* 3.0*    Cardiac Enzymes No results for input(s): TROPONINI, PROBNP in the last 72 hours.  Glucose Recent Labs    12/18/17 0442 12/18/17 0715 12/18/17 1139 12/18/17 1934 12/18/17 2336 12/19/17 0732  GLUCAP 97 111* 155* 220* 196* 164*    Imaging No results found.   Studies: Echo 5/03 >> mild LVH, EF 25%, mild MR, PAS 57 mmHg  Lines/tubes: Rt IJ CVL 5/02 >>  Assessment/plan:  Acute on chronic systolic CHF with cardiogenic shock. Hx of CAD s/p CABG, PAF. - per cardiology  Acute renal failure with ATN. CKD 3 - baseline creatinine 1.8. Hyponatremia in setting of CHF. - f/u BMET  Elevated LFTs 2nd to shock. - f/u LFTs  Anemia of critical illness and chronic disease. - f/u CBC  DM. Hypothyroidism. - SSI - synthroid  Acute metabolic encephalopathy. - wean off precedex as able  DVT prophylaxis - heparin gtt SUP - not indiciated Nutrition - 2 gram sodium Goals of care - DNR/DNI,  palliative care consulted  Coralyn Helling, MD [facility] Pulmonary/Critical Care 12/19/2017, 8:19 AM

## 2017-12-20 DIAGNOSIS — Z7189 Other specified counseling: Secondary | ICD-10-CM

## 2017-12-20 DIAGNOSIS — R451 Restlessness and agitation: Secondary | ICD-10-CM

## 2017-12-20 DIAGNOSIS — I509 Heart failure, unspecified: Secondary | ICD-10-CM

## 2017-12-20 LAB — GLUCOSE, CAPILLARY
GLUCOSE-CAPILLARY: 270 mg/dL — AB (ref 65–99)
GLUCOSE-CAPILLARY: 134 mg/dL — AB (ref 65–99)
GLUCOSE-CAPILLARY: 193 mg/dL — AB (ref 65–99)
Glucose-Capillary: 117 mg/dL — ABNORMAL HIGH (ref 65–99)
Glucose-Capillary: 157 mg/dL — ABNORMAL HIGH (ref 65–99)
Glucose-Capillary: 189 mg/dL — ABNORMAL HIGH (ref 65–99)

## 2017-12-20 LAB — COMPREHENSIVE METABOLIC PANEL
ALBUMIN: 3.1 g/dL — AB (ref 3.5–5.0)
ALT: 328 U/L — ABNORMAL HIGH (ref 17–63)
ANION GAP: 11 (ref 5–15)
AST: 90 U/L — AB (ref 15–41)
Alkaline Phosphatase: 141 U/L — ABNORMAL HIGH (ref 38–126)
BILIRUBIN TOTAL: 2.1 mg/dL — AB (ref 0.3–1.2)
BUN: 29 mg/dL — AB (ref 6–20)
CHLORIDE: 89 mmol/L — AB (ref 101–111)
CO2: 29 mmol/L (ref 22–32)
Calcium: 8.6 mg/dL — ABNORMAL LOW (ref 8.9–10.3)
Creatinine, Ser: 1.77 mg/dL — ABNORMAL HIGH (ref 0.61–1.24)
GFR calc Af Amer: 40 mL/min — ABNORMAL LOW (ref >60)
GFR calc non Af Amer: 35 mL/min — ABNORMAL LOW (ref >60)
GLUCOSE: 127 mg/dL — AB (ref 65–99)
POTASSIUM: 5 mmol/L (ref 3.5–5.1)
Sodium: 129 mmol/L — ABNORMAL LOW (ref 135–145)
TOTAL PROTEIN: 6.7 g/dL (ref 6.5–8.1)

## 2017-12-20 LAB — COOXEMETRY PANEL
Carboxyhemoglobin: 2.3 % — ABNORMAL HIGH (ref 0.5–1.5)
METHEMOGLOBIN: 1.2 % (ref 0.0–1.5)
O2 Saturation: 73 %
Total hemoglobin: 10.2 g/dL — ABNORMAL LOW (ref 12.0–16.0)

## 2017-12-20 LAB — CBC
HCT: 30 % — ABNORMAL LOW (ref 39.0–52.0)
HEMOGLOBIN: 9.5 g/dL — AB (ref 13.0–17.0)
MCH: 25.5 pg — AB (ref 26.0–34.0)
MCHC: 31.7 g/dL (ref 30.0–36.0)
MCV: 80.6 fL (ref 78.0–100.0)
Platelets: 162 10*3/uL (ref 150–400)
RBC: 3.72 MIL/uL — AB (ref 4.22–5.81)
RDW: 19.2 % — ABNORMAL HIGH (ref 11.5–15.5)
WBC: 6.7 10*3/uL (ref 4.0–10.5)

## 2017-12-20 LAB — MAGNESIUM: MAGNESIUM: 2.2 mg/dL (ref 1.7–2.4)

## 2017-12-20 LAB — HEPARIN LEVEL (UNFRACTIONATED): HEPARIN UNFRACTIONATED: 0.24 [IU]/mL — AB (ref 0.30–0.70)

## 2017-12-20 MED ORDER — APIXABAN 2.5 MG PO TABS
2.5000 mg | ORAL_TABLET | Freq: Two times a day (BID) | ORAL | Status: DC
  Administered 2017-12-20 – 2017-12-23 (×7): 2.5 mg via ORAL
  Filled 2017-12-20 (×7): qty 1

## 2017-12-20 MED ORDER — OLANZAPINE 5 MG PO TBDP
2.5000 mg | ORAL_TABLET | Freq: Every day | ORAL | Status: DC
  Administered 2017-12-20 – 2017-12-22 (×3): 2.5 mg via ORAL
  Filled 2017-12-20 (×4): qty 0.5

## 2017-12-20 MED ORDER — TORSEMIDE 20 MG PO TABS
100.0000 mg | ORAL_TABLET | Freq: Two times a day (BID) | ORAL | Status: DC
  Administered 2017-12-20 – 2017-12-21 (×2): 100 mg via ORAL
  Filled 2017-12-20 (×2): qty 5

## 2017-12-20 MED ORDER — DIGOXIN 125 MCG PO TABS
0.0625 mg | ORAL_TABLET | Freq: Every day | ORAL | Status: DC
  Administered 2017-12-20: 0.0625 mg via ORAL
  Filled 2017-12-20: qty 1

## 2017-12-20 MED ORDER — ATORVASTATIN CALCIUM 10 MG PO TABS
10.0000 mg | ORAL_TABLET | Freq: Every day | ORAL | Status: DC
  Administered 2017-12-20 – 2017-12-22 (×3): 10 mg via ORAL
  Filled 2017-12-20 (×4): qty 1

## 2017-12-20 NOTE — Progress Notes (Signed)
PCCM Progress Note  Admission date: 12-28-17 Referring provider: Duke Salvia CC: Weakness  HPI: 83 yo male with recurrent admissions for CHF presented to [facility] with weakness and swelling with bradycardia. PMHx of CAD s/p CABG, HTN, systolic CHF with EF 20 to 25%, RA, Stage 3 CKD, PAS on eliquis, DM, hypothyroidism.  Subjective: Denies chest pain, dyspnea, abdominal pain.  Slept well overnight.  Vital signs: BP 101/80   Pulse 83   Temp 98 F (36.7 C) (Axillary)   Resp 18   Ht 5\' 10"  (1.778 m)   Wt 184 lb 1.4 oz (83.5 kg)   SpO2 97%   BMI 26.41 kg/m   Intake/outpt: I/O last 3 completed shifts: In: 3284.6 [P.O.:1800; I.V.:1484.6] Out: 9575 [Urine:9575]  Physical exam:  General - pleasant Eyes - pupils reactive ENT - no sinus tenderness, no oral exudate, no LAN Cardiac - regular, no murmur Chest - no wheeze, rales Abd - soft, non tender Ext - no edema Skin - no rashes Neuro - normal strength Psych - normal mood    CBC Recent Labs    12/18/17 0414  12/18/17 1433 12/19/17 0406 12/20/17 0457  WBC 4.6  --   --  4.7 6.7  HGB 7.6*  --  9.1* 9.2* 9.5*  HCT 25.0*   < > 28.9* 28.7* 30.0*  PLT 215  --   --  144* 162   < > = values in this interval not displayed.    Coag's Recent Labs    12/17/17 1115  INR 1.39    BMET Recent Labs    12/18/17 1433 12/19/17 0406 12/20/17 0457  NA 126* 128* 129*  K 3.8 4.0 5.0  CL 87* 87* 89*  CO2 29 30 29   BUN 37* 35* 29*  CREATININE 1.98* 1.91* 1.77*  GLUCOSE 154* 172* 127*    Electrolytes Recent Labs    12/18/17 1433 12/19/17 0406 12/20/17 0457  CALCIUM 8.4* 8.3* 8.6*  MG  --   --  2.2    Sepsis Markers No results for input(s): PROCALCITON, O2SATVEN in the last 72 hours.  Invalid input(s): LACTICACIDVEN  ABG No results for input(s): PHART, PCO2ART, PO2ART in the last 72 hours.  Liver Enzymes Recent Labs    12/18/17 0414 12/19/17 0406 12/20/17 0457  AST 282* 139* 90*  ALT 543* 449* 328*   ALKPHOS 150* 154* 141*  BILITOT 1.9* 1.7* 2.1*  ALBUMIN 2.9* 3.0* 3.1*    Cardiac Enzymes No results for input(s): TROPONINI, PROBNP in the last 72 hours.  Glucose Recent Labs    12/19/17 0405 12/19/17 0732 12/19/17 1140 12/19/17 1603 12/19/17 1935 12/19/17 2340  GLUCAP 154* 164* 224* 124* 193* 166*    Imaging Dg Chest Port 1 View  Result Date: 12/19/2017 CLINICAL DATA:  Shortness of breath.  Respiratory failure. EXAM: PORTABLE CHEST 1 VIEW COMPARISON:  28-Dec-2017 FINDINGS: Stable position of the left cardiac ICD. Heart size remains slightly prominent for size but no change. Right jugular central line tip in the lower SVC and stable. Slightly increased densities in the perihilar regions and most compatible with pulmonary edema. Median sternotomy wires are present. IMPRESSION: Slightly increased perihilar opacities. Findings are suggestive for pulmonary edema. Support apparatuses are stable. Electronically Signed   By: 02/18/2018 M.D.   On: 12/19/2017 09:21     Studies: Echo 5/03 >> mild LVH, EF 25%, mild MR, PAS 57 mmHg  Lines/tubes: Rt IJ CVL 5/02 >>  Assessment/plan:  Acute on chronic systolic CHF with cardiogenic shock. Hx  of CAD s/p CABG, PAF. - transition off lasix gtt to torsemide 5/07 - dobutamine per cardiology - continue amiodarone, eliquis - holding lipitor with elevated LFTs - hold outpt digoxin, coreg, zaroxolyn, aldactone  Acute renal failure with ATN. CKD 3 - baseline creatinine 1.8. Hyponatremia in setting of CHF. - f/u BMET  Elevated LFTs 2nd to shock. - improving - f/u LFT's intermittently  Anemia of critical illness and chronic disease. - f/u CBC intermittently  DM. Hypothyroidism. - continue synthroid - SSI  Acute metabolic encephalopathy. - likely from hyponatremia and hypotension - improved 5/07 - try to keep off precedex as able  DVT prophylaxis - heparin gtt SUP - not indiciated Nutrition - 2 gram sodium Goals of care -  DNR/DNI, palliative care consulted  Coralyn Helling, MD [facility] Pulmonary/Critical Care 12/20/2017, 8:11 AM

## 2017-12-20 NOTE — Progress Notes (Signed)
CSW following for possible SNF placement if pt appropriate when stable for DC.  Per previous notes family was wanting placement at [name] in [place] if pt is appropriate for short term rehab.  Pt will need to be stable off of dobutamine in order for placement in [place] to be possible- otherwise will have to consider [facility] facility options  Burna Sis, LCSW Clinical Social Worker [phone]

## 2017-12-20 NOTE — Progress Notes (Signed)
Daily Progress Note   Patient Name: [name]       Date: 12/20/2017 DOB: 11-25-1934  Age: 84 y.o. MRN#: [id number] Attending Physician: Coralyn Helling, MD Primary Care Physician: [name], MD Admit Date: 12/28/17  Reason for Consultation/Follow-up: Establishing goals of care  Subjective: Patient sitting in chair. Eating ice- tells me its the best thing he's ever tasted. A bet lethargic. Asks if its Thursday. Tells me he feels fine. Tells me he needs Hospice (without my bringing it up) and he needs to talk to [facility] about it. Doesn't engage further about end of life discussion.  Noted plan to wean from precedex. Discussed with patient's nurse- when precedex is turned down- patient becomes very anxious and begins shaking. He perseverates on the need to drink water.  Called [name]. [name] cannot come for meeting during the day. I mentioned to [name] that patient broached topic of Hospice. Gregory's states GOC are to continue aggressive care and return to hospital if needed in future.   Review of Systems  Constitutional: Positive for malaise/fatigue.  Cardiovascular: Negative for chest pain and palpitations.  Gastrointestinal: Negative for blood in stool.  Psychiatric/Behavioral: Negative for depression.    Length of Stay: 5  Current Medications: Scheduled Meds:  . amiodarone  200 mg Oral Daily  . apixaban  2.5 mg Oral BID  . atorvastatin  10 mg Oral q1800  . Chlorhexidine Gluconate Cloth  6 each Topical Daily  . digoxin  0.0625 mg Oral Daily  . insulin aspart  0-15 Units Subcutaneous Q4H  . levothyroxine  75 mcg Oral QAC breakfast  . mupirocin ointment  1 application Nasal BID  . sodium chloride flush  10-40 mL Intracatheter Q12H  . torsemide  100 mg Oral BID     Continuous Infusions: . sodium chloride Stopped (12/17/17 1000)  . dexmedetomidine (PRECEDEX) IV infusion 0.699 mcg/kg/hr (12/20/17 1300)  . DOBUTamine 2.5 mcg/kg/min (12/20/17 1300)    PRN Meds: sodium chloride, acetaminophen, bisacodyl, sodium chloride flush, traMADol  Physical Exam  Constitutional: He appears well-developed. No distress.  Ill appearing  Musculoskeletal:  Edema in hands and BLE  Neurological:  Lethargic, weak  Skin: Skin is warm and dry.  Psychiatric:  Pleasant, forgetful  Nursing note and vitals reviewed.           Vital  Signs: BP (!) 95/55   Pulse (!) 109   Temp 98.1 F (36.7 C) (Oral)   Resp 19   Ht 5\' 10"  (1.778 m)   Wt 83.5 kg (184 lb 1.4 oz)   SpO2 98%   BMI 26.41 kg/m  SpO2: SpO2: 98 % O2 Device: O2 Device: Room Air O2 Flow Rate:    Intake/output summary:   Intake/Output Summary (Last 24 hours) at 12/20/2017 1505 Last data filed at 12/20/2017 1322 Gross per 24 hour  Intake 2129.04 ml  Output 5105 ml  Net -2975.96 ml   LBM: Last BM Date: 12/18/17 Baseline Weight: Weight: 89.6 kg (197 lb 8.5 oz) Most recent weight: Weight: 83.5 kg (184 lb 1.4 oz)       Palliative Assessment/Data: PPS: 30%    Flowsheet Rows     Most Recent Value  Intake Tab  Referral Department  Hospitalist  Unit at Time of Referral  ICU  Palliative Care Primary Diagnosis  Cardiac  Date Notified  12/16/17  Palliative Care Type  Return patient Palliative Care  Reason for referral  Clarify Goals of Care  Date of Admission  12/16/17  Date first seen by Palliative Care  12/16/17  # of days Palliative referral response time  0 Day(s)  # of days IP prior to Palliative referral  0  Clinical Assessment  Palliative Performance Scale Score  30%  Psychosocial & Spiritual Assessment  Palliative Care Outcomes  Patient/Family meeting held?  No      Patient Active Problem List   Diagnosis Date Noted  . Acute metabolic encephalopathy   . Agitation requiring  sedation protocol   . Acute on chronic respiratory failure with hypoxemia (HCC)   . Palliative care by specialist   . Arthralgia of both lower legs   . Hypokalemia   . Hyperkalemia 12/14/2017  . Pressure injury of skin 12/22/2017  . Palliative care encounter   . Goals of care, counseling/discussion   . Acute on chronic diastolic congestive heart failure (HCC) 09/09/2017  . Acute on chronic systolic heart failure (HCC) 08/25/2017  . CHF (congestive heart failure) (HCC) 07/19/2017  . Hyponatremia 04/02/2017  . Normocytic anemia 04/02/2017  . OA (osteoarthritis) 11/19/2016  . Paroxysmal atrial fibrillation (HCC) 09/10/2016  . CKD (chronic kidney disease), stage III (HCC) 09/10/2016  . Physical deconditioning 09/10/2016  . Cardiogenic shock (HCC)   . Acute renal failure with acute renal cortical necrosis superimposed on stage 3 chronic kidney disease (HCC) 08/05/2016  . Single implantable cardioverter-defibrillator BSX    . Ventricular tachycardia-treated the ATP 01/29/2011  . Hx of CABG 04/17/2009  . Cardiomyopathy, ischemic 04/17/2009  . Acute on chronic systolic CHF (congestive heart failure) (HCC) 04/17/2009  . Insulin dependent diabetes mellitus (HCC) 04/12/2009  . Hyperlipidemia LDL goal <70 04/12/2009  . Essential hypertension 04/12/2009  . PUD 04/12/2009  . Rheumatoid arthritis (HCC) 04/12/2009    Palliative Care Assessment & Plan   Patient Profile: 83 y.o.malewith past medical history of ICM with EF 20-25% s/p AICD, CAD s/p CABG, HTN, RA, CKD III, who has multiple recent hospitalizations for CHF and is now readmitted on5/2/2019with cardiogenic shock requiring pressors. Palliative care was consulted to help address goals.  Assessment/Recommendations/Plan   Will add low dose olanzapine 2.5mg  po qhs for insomnia and anxiety in attempt to help assist patient off precedex  PMT will continue to follow and support holistically   Goals of Care and Additional  Recommendations:  Limitations on Scope of Treatment: Full Scope Treatment  Code Status:  Limited code- Bipap ok   Prognosis:   < 6 months d/t advanced CHF s/p cardiogenic shock  Discharge Planning:  To Be Determined  Care plan was discussed with patient's nurse and patient's son- [name]  Thank you for allowing the Palliative Medicine Team to assist in the care of this patient.   Time In: 1445 Time Out: 1520 Total Time 35 mins Prolonged Time Billed no      Greater than 50%  of this time was spent counseling and coordinating care related to the above assessment and plan.  Ocie Bob, AGNP-C Palliative Medicine   Please contact Palliative Medicine Team phone at [phone] for questions and concerns.

## 2017-12-20 NOTE — NC FL2 (Signed)
[place] MEDICAID FL2 LEVEL OF CARE SCREENING TOOL     IDENTIFICATION  Patient Name: [name] Birthdate: [birth year] Sex: male Admission Date (Current Location): 12/26/17  [facility] and [place] Number:  Producer, television/film/video and Address:  [facility], [address] [place], [place] [address]      Provider Number: [id number]  Attending Physician Name and Address:  Coralyn Helling, MD  Relative Name and Phone Number:       Current Level of Care: Hospital Recommended Level of Care: Skilled Nursing Facility Prior Approval Number:    Date Approved/Denied:   PASRR Number: [id number]  Discharge Plan: SNF    Current Diagnoses: Patient Active Problem List   Diagnosis Date Noted  . Acute metabolic encephalopathy   . Agitation requiring sedation protocol   . Acute on chronic respiratory failure with hypoxemia (HCC)   . Palliative care by specialist   . Arthralgia of both lower legs   . Hypokalemia   . Hyperkalemia 26-Dec-2017  . Pressure injury of skin Dec 26, 2017  . Palliative care encounter   . Goals of care, counseling/discussion   . Acute on chronic diastolic congestive heart failure (HCC) 09/09/2017  . Acute on chronic systolic heart failure (HCC) 08/25/2017  . CHF (congestive heart failure) (HCC) 07/19/2017  . Hyponatremia 04/02/2017  . Normocytic anemia 04/02/2017  . OA (osteoarthritis) 11/19/2016  . Paroxysmal atrial fibrillation (HCC) 09/10/2016  . CKD (chronic kidney disease), stage III (HCC) 09/10/2016  . Physical deconditioning 09/10/2016  . Cardiogenic shock (HCC)   . Acute renal failure with acute renal cortical necrosis superimposed on stage 3 chronic kidney disease (HCC) 08/05/2016  . Single implantable cardioverter-defibrillator BSX    . Ventricular tachycardia-treated the ATP 01/29/2011  . Hx of CABG 04/17/2009  . Cardiomyopathy, ischemic 04/17/2009  . Acute on chronic systolic CHF (congestive heart failure) (HCC) 04/17/2009   . Insulin dependent diabetes mellitus (HCC) 04/12/2009  . Hyperlipidemia LDL goal <70 04/12/2009  . Essential hypertension 04/12/2009  . PUD 04/12/2009  . Rheumatoid arthritis (HCC) 04/12/2009    Orientation RESPIRATION BLADDER Height & Weight     Self, Place  Normal Incontinent, External catheter Weight: 184 lb 1.4 oz (83.5 kg) Height:  5\' 10"  (177.8 cm)  BEHAVIORAL SYMPTOMS/MOOD NEUROLOGICAL BOWEL NUTRITION STATUS      Continent Diet  AMBULATORY STATUS COMMUNICATION OF NEEDS Skin   Extensive Assist Verbally Normal                       Personal Care Assistance Level of Assistance  Bathing, Dressing Bathing Assistance: Maximum assistance   Dressing Assistance: Maximum assistance     Functional Limitations Info             SPECIAL CARE FACTORS FREQUENCY  PT (By licensed PT), OT (By licensed OT)                    Contractures      Additional Factors Info  Code Status, Allergies, Isolation Precautions Code Status Info: Partial Allergies Info: Metformin And Related, Rivastigmine, Penicillins     Isolation Precautions Info: MRSA     Current Medications (12/20/2017):  This is the current hospital active medication list Current Facility-Administered Medications  Medication Dose Route Frequency Provider Last Rate Last Dose  . 0.9 %  sodium chloride infusion  250 mL Intravenous PRN 02/19/2018, NP   Stopped at 12/17/17 1000  . acetaminophen (TYLENOL) tablet 650 mg  650 mg Oral Q6H PRN Deterding, Dorise Hiss, MD   650 mg at 12/16/17 2013  . amiodarone (PACERONE) tablet 200 mg  200 mg Oral Daily Laurey Morale, MD   200 mg at 12/20/17 1008  . apixaban (ELIQUIS) tablet 2.5 mg  2.5 mg Oral BID Mosetta Anis, RPH   2.5 mg at 12/20/17 1008  . atorvastatin (LIPITOR) tablet 10 mg  10 mg Oral q1800 Laurey Morale, MD      . bisacodyl (DULCOLAX) suppository 10 mg  10 mg Rectal Daily PRN Laurey Morale, MD   10 mg at 12/18/17 1706  . Chlorhexidine  Gluconate Cloth 2 % PADS 6 each  6 each Topical Daily Tobey Grim, NP   6 each at 12/20/17 0500  . dexmedetomidine (PRECEDEX) 200 MCG/50ML (4 mcg/mL) infusion  0.2-0.7 mcg/kg/hr Intravenous Continuous Deterding, Dorise Hiss, MD 15.5 mL/hr at 12/20/17 1300 0.699 mcg/kg/hr at 12/20/17 1300  . digoxin (LANOXIN) tablet 0.0625 mg  0.0625 mg Oral Daily Laurey Morale, MD   0.0625 mg at 12/20/17 1009  . DOBUTamine (DOBUTREX) infusion 4000 mcg/mL  2.5 mcg/kg/min Intravenous Continuous Graciella Freer, PA-C 3.4 mL/hr at 12/20/17 1300 2.5 mcg/kg/min at 12/20/17 1300  . insulin aspart (novoLOG) injection 0-15 Units  0-15 Units Subcutaneous Q4H Tobey Grim, NP   3 Units at 12/20/17 1318  . levothyroxine (SYNTHROID, LEVOTHROID) tablet 75 mcg  75 mcg Oral QAC breakfast Tobey Grim, NP   75 mcg at 12/20/17 0845  . mupirocin ointment (BACTROBAN) 2 % 1 application  1 application Nasal BID Reyes Ivan, MD   1 application at 12/20/17 1014  . sodium chloride flush (NS) 0.9 % injection 10-40 mL  10-40 mL Intracatheter Q12H Tobey Grim, NP   10 mL at 12/20/17 1015  . sodium chloride flush (NS) 0.9 % injection 10-40 mL  10-40 mL Intracatheter PRN Tobey Grim, NP      . torsemide (DEMADEX) tablet 100 mg  100 mg Oral BID Graciella Freer, PA-C      . traMADol Janean Sark) tablet 50 mg  50 mg Oral Q12H PRN Laurey Morale, MD   50 mg at 12/18/17 2259     Discharge Medications: Please see discharge summary for a list of discharge medications.  Relevant Imaging Results:  Relevant Lab Results:   Additional Information SS# [id number]  Burna Sis, LCSW

## 2017-12-20 NOTE — Progress Notes (Signed)
ANTICOAGULATION CONSULT NOTE   Pharmacy Consult for Heparin (Eliquis on hold) Indication: atrial fibrillation   Labs: Recent Labs    12/17/17 1115  12/18/17 0414  12/18/17 0933 12/18/17 1433 12/19/17 0359 12/19/17 0406 12/20/17 0457  HGB  --    < > 7.6*  --   --  9.1*  --  9.2* 9.5*  HCT  --    < > 25.0*   < >  --  28.9*  --  28.7* 30.0*  PLT  --    < > 215  --   --   --   --  144* 162  LABPROT 16.9*  --   --   --   --   --   --   --   --   INR 1.39  --   --   --   --   --   --   --   --   HEPARINUNFRC 0.63  --   --   --  0.47  --  0.33  --  0.24*  CREATININE  --    < > 2.09*  --   --  1.98*  --  1.91* 1.77*   < > = values in this interval not displayed.     Assessment:  83 yr old male to begin IV heparin for atrial fibrillation.  Eliquis PTA with last dose reported ~8am 5/2. Transferred to [facility] from [facility] with cardiogenic shock vs cardiorenal syndrome.    Heparin level is low this AM, Hgb stable     Goal of Therapy:  Heparin level 0.3-0.7 units/ml Monitor platelets by anticoagulation protocol: Yes   Plan:  Inc heparin to 900 units/hr 8h HL Daily heparin level and CBC. F/u plans to restart Eliquis as appropriate.  ADDENDUM: Transitioning back to apixaban.  Plan: -Stop heparin -Resume apixaban 2.5mg  BID  Fredonia Highland, PharmD, BCPS PGY-2 Cardiology Pharmacy Resident Pager: [phone] 12/20/2017

## 2017-12-20 NOTE — Progress Notes (Addendum)
Patient ID: [name], male   DOB: [birth year], 83 y.o.   MRN: [id number]     Advanced Heart Failure Rounding Note  PCP-Cardiologist: Olga Millers, MD   Subjective:    Admitted with volume overload. He was in cardiogenic shock.   Started on milrinone but later switched to dobutamine due to hypotension. Norepi later added. Norepinephrine weaned off on 5/3.  Coox 73.0% this am on dobutamine 5.   Creatinine 1.77. CVP 4-5. Down another 5 lbs.   Feeling better today. Alert to person and place. Very thirsty. Denies SOB.   Hgb 9.5.   Na 122 -> Tolvaptan -> 128 -> 129.   Objective:   Weight Range: 184 lb 1.4 oz (83.5 kg) Body mass index is 26.41 kg/m.   Vital Signs:   Temp:  [97.4 F (36.3 C)-98.2 F (36.8 C)] 97.5 F (36.4 C) (05/07 0400) Pulse Rate:  [72-95] 83 (05/07 0700) Resp:  [12-24] 18 (05/07 0700) BP: (90-119)/(40-86) 101/80 (05/07 0700) SpO2:  [96 %-100 %] 97 % (05/07 0700) Weight:  [184 lb 1.4 oz (83.5 kg)] 184 lb 1.4 oz (83.5 kg) (05/07 0500) Last BM Date: 12/18/17  Weight change: Filed Weights   12/18/17 0435 12/19/17 0400 12/20/17 0500  Weight: 195 lb 8.8 oz (88.7 kg) 189 lb 13.1 oz (86.1 kg) 184 lb 1.4 oz (83.5 kg)    Intake/Output:   Intake/Output Summary (Last 24 hours) at 12/20/2017 0730 Last data filed at 12/20/2017 0700 Gross per 24 hour  Intake 2269.24 ml  Output 6475 ml  Net -4205.76 ml    Physical Exam   CVP 4-5  General: NAD  HEENT: Normal Neck: Supple. JVP not elevated. Carotids 2+ bilat; no bruits. No thyromegaly or nodule noted. Cor: PMI nondisplaced. RRR, No M/G/R noted Lungs: CTAB, normal effort. Abdomen: Soft, non-tender, non-distended, no HSM. No bruits or masses. +BS  Extremities: No cyanosis, clubbing, or rash. Trace to 1+ ankle edema.  Neuro: Alert to person.   Telemetry   NSR 70-80s, personally reviewed.   EKG    No new tracings.    Labs    CBC Recent Labs    12/18/17 0414  12/19/17 0406 12/20/17 0457    WBC 4.6  --  4.7 6.7  NEUTROABS 3.6  --  3.9  --   HGB 7.6*   < > 9.2* 9.5*  HCT 25.0*   < > 28.7* 30.0*  MCV 83.9  --  79.7 80.6  PLT 215  --  144* 162   < > = values in this interval not displayed.   Basic Metabolic Panel Recent Labs    63/01/60 0406 12/20/17 0457  NA 128* 129*  K 4.0 5.0  CL 87* 89*  CO2 30 29  GLUCOSE 172* 127*  BUN 35* 29*  CREATININE 1.91* 1.77*  CALCIUM 8.3* 8.6*  MG  --  2.2   Liver Function Tests Recent Labs    12/19/17 0406 12/20/17 0457  AST 139* 90*  ALT 449* 328*  ALKPHOS 154* 141*  BILITOT 1.7* 2.1*  PROT 6.5 6.7  ALBUMIN 3.0* 3.1*   No results for input(s): LIPASE, AMYLASE in the last 72 hours. Cardiac Enzymes No results for input(s): CKTOTAL, CKMB, CKMBINDEX, TROPONINI in the last 72 hours.  BNP: BNP (last 3 results) Recent Labs    08/25/17 1140 09/09/17 0525 12/16/17 0345  BNP 1,102.9* 1,105.4* 1,012.3*    ProBNP (last 3 results) No results for input(s): PROBNP in the last 8760 hours.   D-Dimer  No results for input(s): DDIMER in the last 72 hours. Hemoglobin A1C No results for input(s): HGBA1C in the last 72 hours. Fasting Lipid Panel No results for input(s): CHOL, HDL, LDLCALC, TRIG, CHOLHDL, LDLDIRECT in the last 72 hours. Thyroid Function Tests No results for input(s): TSH, T4TOTAL, T3FREE, THYROIDAB in the last 72 hours.  Invalid input(s): FREET3  Other results:   Imaging    No results found.   Medications:     Scheduled Medications: . amiodarone  200 mg Oral Daily  . aspirin  81 mg Oral Daily  . Chlorhexidine Gluconate Cloth  6 each Topical Daily  . insulin aspart  0-15 Units Subcutaneous Q4H  . levothyroxine  75 mcg Oral QAC breakfast  . mirtazapine  15 mg Oral Once  . mupirocin ointment  1 application Nasal BID  . potassium chloride  40 mEq Oral BID  . sodium chloride flush  10-40 mL Intracatheter Q12H    Infusions: . sodium chloride Stopped (12/17/17 1000)  . dexmedetomidine  (PRECEDEX) IV infusion 0.7 mcg/kg/hr (12/20/17 0528)  . DOBUTamine 4.985 mcg/kg/min (12/19/17 1800)  . furosemide (LASIX) infusion 15 mg/hr (12/19/17 1800)  . heparin 900 Units/hr (12/20/17 0642)    PRN Medications: sodium chloride, acetaminophen, bisacodyl, sodium chloride flush, traMADol    Patient Profile   Raymond Vangorder Jonesis a 82 y.o.malewith CAD s/p CABG, hypertension, hyperlipidemia and chronic systolic and diastolic heart failure presenting today with sob at rest and increased leg edema.    Assessment/Plan   1. Acute on chronic systolic CHF with cardiogenic shock: EF 20-25% in 12/18, ischemic cardiomyopathy.  Echo this admission with EF 25%, mild MR, mildly dilated RV, PASP 57 mmHg. - Coox 73.0% this am on dobutamine 5  - CVP 4-5.  - Stop lasix gtt. Likely start torsemide this evening. Was on 100 mg BID torsemide at home.  - Home spironolactone, Coreg, and digoxin on hold for now => creatinine at baseline, can restart digoxin.   2. CAD: s/p CABG.  Mild TnI elevation likely demand ischemia.  Coronary angiography 1/18 with patent graft and no targets for revascularization. - Statin held for now with shock liver => can restart with downtrend in LFTs.   - Getting ASA 81 daily.  - No change to current plan.   3. Atrial fibrillation: Paroxysmal.   - Remains in NSR.  - Continue amiodarone 200 daily to keep in NSR, elevated LFTs from shock liver and trending down.  - Currently on heparin gtt, continue for now but can restart Eliquis eventually.  Hemoglobin down but no overt bleeding.  4. Elevated LFTs:  - Suspect shock liver. Support cardiac output.  - LFTs trending down.  5. Hyponatremia: Hypervolemic hyponatremia. - 122 - > 128 > 129 after dose of Tolvaptan x 2 days.  6. Delirium: - Worse 5/5 in setting of worsening hyponatremia, now improved.   - Reinforced fluid restriction.  - Na improved to 129 with dose of tolvaptan x 2.  - On Precedex.   7. AKI: Suspect cardiorenal  with cardiogenic shock.  - Creatinine 1.77 today. Baseline creatinine around 2.0.  8. Code status: DNR/DNI.  Palliative care came by yesterday.  Hospice discussions begun, son not ready yet to turn off ICD.  Will definitely need SNF at discharge (will be unable to return home).   - No change to current plan.   9. Hypokalemia:  - Over corrected to 5.0 today.   10. Anemia:  - Hgb 7.6 -> 9.2 -> 9.5. No sign  of overt bleeding.  Suspect chronic disease, renal disease play a role.   - Received feraheme 12/19/17 with low Iron sat.  - FOBT pending.  Making slow progress. To po diuretics.  Will attempt wean of dobutamine now that is diuresed. Will discuss timing with MD.   Graciella Freer, PA-C  12/20/2017 7:30 AM   Advanced Heart Failure Team Pager [phone] (M-F; 7a - 4p)  Please contact [facility] for night-coverage after hours (4p -7a ) and weekends on [email or web address]  Patient seen with PA, agree with the above note.  He is making improvement.  Co-ox 73% today with CVP 4-5.  He is alert, oriented to place/person.  Some confusion at times.   On exam, regular S1S2.  JVP not elevated.  Trace ankle edema.   Today, will decrease dobutamine to 2.5 mcg/kg/min. He can restart home digoxin 0.0625 mg daily.   Stop Lasix gtt and start back on torsemide 100 mg bid.    He can restart his home statin.   Stop heparin gtt, start back on apixaban 2.5 mg bid.   Will need SNF.   Marca Ancona 12/20/2017 8:20 AM

## 2017-12-20 NOTE — Progress Notes (Signed)
ANTICOAGULATION CONSULT NOTE   Pharmacy Consult for Heparin (Eliquis on hold) Indication: atrial fibrillation   Labs: Recent Labs    12/17/17 1115  12/18/17 0414  12/18/17 0933 12/18/17 1433 12/19/17 0359 12/19/17 0406 12/20/17 0457  HGB  --    < > 7.6*  --   --  9.1*  --  9.2* 9.5*  HCT  --    < > 25.0*   < >  --  28.9*  --  28.7* 30.0*  PLT  --    < > 215  --   --   --   --  144* 162  LABPROT 16.9*  --   --   --   --   --   --   --   --   INR 1.39  --   --   --   --   --   --   --   --   HEPARINUNFRC 0.63  --   --   --  0.47  --  0.33  --  0.24*  CREATININE  --    < > 2.09*  --   --  1.98*  --  1.91*  --    < > = values in this interval not displayed.     Assessment:  83 yr old male to begin IV heparin for atrial fibrillation.  Eliquis PTA with last dose reported ~8am 5/2. Transferred to [facility] from [facility] with cardiogenic shock vs cardiorenal syndrome.    Heparin level is low this AM, Hgb stable     Goal of Therapy:  Heparin level 0.3-0.7 units/ml Monitor platelets by anticoagulation protocol: Yes   Plan:  Inc heparin to 900 units/hr 8h HL Daily heparin level and CBC. F/u plans to restart Eliquis as appropriate.  Abran Duke, PharmD, BCPS Clinical Pharmacist Phone: [phone]

## 2017-12-20 NOTE — Evaluation (Signed)
Physical Therapy Evaluation Patient Details Name: [name] MRN: [id number] DOB: 1935-04-26 Today's Date: 12/20/2017   History of Present Illness  83 yo admitted with weakness, edema, bradycardia, CHF with acute metabolic encephalopathy. PMHx of CAD s/p CABG, HTN, systolic CHF with EF 20 to 25%, RA, Stage 3 CKD, PAF on eliquis, DM, hypothyroidism  Clinical Impression  Pt pleasant but highly perseverative on water. Pt was given swab at beginning and end of session with goal target of 0930 for additional swab, pt able to read clock, count down time to goal but continued to ask constantly for water despite education for HF and limited intake. Pt with decreased cognition, strength, balance and function who will benefit from acute therapy to maximize mobility, function, strength and balance to decrease burden of care.   VSS     Follow Up Recommendations SNF;Supervision/Assistance - 24 hour    Equipment Recommendations  None recommended by PT    Recommendations for Other Services       Precautions / Restrictions Precautions Precautions: Fall Restrictions Weight Bearing Restrictions: No      Mobility  Bed Mobility Overal bed mobility: Needs Assistance Bed Mobility: Supine to Sit     Supine to sit: Min assist     General bed mobility comments: min assist with increased time, cues and encouragement with HHA to fully elevate trunk and scoot to EOB  Transfers Overall transfer level: Needs assistance   Transfers: Sit to/from [employer] Transfers Sit to Stand: Mod assist         General transfer comment: mod assist with left knee blocked, increased time and assist to initiate rise for pt to stand. pt unable to advance feet. Stood x 3 trials with max +2 to pivot from bed to chair with drop arm  Ambulation/Gait             General Gait Details: unable  Stairs            Wheelchair Mobility    Modified Rankin (Stroke Patients Only)       Balance  Overall balance assessment: Needs assistance   Sitting balance-Leahy Scale: Fair       Standing balance-Leahy Scale: Poor                               Pertinent Vitals/Pain Pain Assessment: No/denies pain    Home Living Family/patient expects to be discharged to:: Private residence Living Arrangements: Other relatives Available Help at Discharge: Family;Available PRN/intermittently Type of Home: House Home Access: Ramped entrance     Home Layout: One level Home Equipment: Insurance underwriter - 2 wheels Additional Comments: states he could get OOB and transfer alone previously and walk with RW limited distance. Per chart he has not walked in 5 years and could transfer. Lives with grandson per pt. Per chart lives with granddaughter    Prior Function Level of Independence: Independent with assistive device(s)         Comments: pt reports he could bathe and dress himself and walk with walker. Per report from last admission he was only transferring and unable to walk. No family present to clarify     Hand Dominance   Dominant Hand: Right    Extremity/Trunk Assessment   Upper Extremity Assessment Upper Extremity Assessment: Generalized weakness    Lower Extremity Assessment Lower Extremity Assessment: Generalized weakness    Cervical / Trunk Assessment Cervical / Trunk Assessment: Kyphotic  Communication  Communication: No difficulties  Cognition Arousal/Alertness: Awake/alert   Overall Cognitive Status: Impaired/Different from baseline Area of Impairment: Orientation;Attention;Memory;Following commands;Safety/judgement;Awareness;Problem solving                 Orientation Level: Disoriented to;Time;Situation Current Attention Level: Focused Memory: Decreased recall of precautions;Decreased short-term memory Following Commands: Follows one step commands inconsistently Safety/Judgement: Decreased awareness of deficits;Decreased awareness  of safety   Problem Solving: Slow processing;Decreased initiation;Difficulty sequencing;Requires verbal cues;Requires tactile cues General Comments: pt very perseverative on getting water and constantly asking despite given specific time frame for getting another sip      General Comments      Exercises     Assessment/Plan    PT Assessment Patient needs continued PT services  PT Problem List Decreased strength;Decreased mobility;Decreased safety awareness;Decreased activity tolerance;Decreased cognition;Decreased balance;Decreased knowledge of use of DME;Decreased coordination       PT Treatment Interventions DME instruction;Therapeutic activities;Cognitive remediation;Therapeutic exercise;Patient/family education;Balance training;Functional mobility training;Neuromuscular re-education    PT Goals (Current goals can be found in the Care Plan section)  Acute Rehab PT Goals Patient Stated Goal: get some water PT Goal Formulation: With patient Time For Goal Achievement: 01/03/18 Potential to Achieve Goals: Fair    Frequency Min 2X/week   Barriers to discharge Decreased caregiver support unclear how much family support since no one present    Co-evaluation               AM-PAC PT "6 Clicks" Daily Activity  Outcome Measure Difficulty turning over in bed (including adjusting bedclothes, sheets and blankets)?: A Little Difficulty moving from lying on back to sitting on the side of the bed? : Unable Difficulty sitting down on and standing up from a chair with arms (e.g., wheelchair, bedside commode, etc,.)?: Unable Help needed moving to and from a bed to chair (including a wheelchair)?: Total Help needed walking in hospital room?: Total Help needed climbing 3-5 steps with a railing? : Total 6 Click Score: 8    End of Session Equipment Utilized During Treatment: Gait belt Activity Tolerance: Patient tolerated treatment well Patient left: in chair;with call bell/phone  within reach;with chair alarm set Nurse Communication: Need for lift equipment;Mobility status PT Visit Diagnosis: Unsteadiness on feet (R26.81);Muscle weakness (generalized) (M62.81)    Time: 4098-1191 PT Time Calculation (min) (ACUTE ONLY): 32 min   Charges:   PT Evaluation $PT Eval Moderate Complexity: 1 Mod PT Treatments $Therapeutic Activity: 8-22 mins   PT G Codes:        Delaney Meigs, PT [phone]   Derelle Cockrell B Tarin Johndrow 12/20/2017, 9:41 AM

## 2017-12-21 ENCOUNTER — Inpatient Hospital Stay (HOSPITAL_COMMUNITY): Payer: Medicare Other

## 2017-12-21 DIAGNOSIS — R05 Cough: Secondary | ICD-10-CM

## 2017-12-21 DIAGNOSIS — R059 Cough, unspecified: Secondary | ICD-10-CM

## 2017-12-21 LAB — CBC
HCT: 29.1 % — ABNORMAL LOW (ref 39.0–52.0)
HEMOGLOBIN: 9.2 g/dL — AB (ref 13.0–17.0)
MCH: 25.2 pg — ABNORMAL LOW (ref 26.0–34.0)
MCHC: 31.6 g/dL (ref 30.0–36.0)
MCV: 79.7 fL (ref 78.0–100.0)
PLATELETS: 144 10*3/uL — AB (ref 150–400)
RBC: 3.65 MIL/uL — AB (ref 4.22–5.81)
RDW: 18.9 % — ABNORMAL HIGH (ref 11.5–15.5)
WBC: 11 10*3/uL — AB (ref 4.0–10.5)

## 2017-12-21 LAB — COMPREHENSIVE METABOLIC PANEL
ALT: 227 U/L — ABNORMAL HIGH (ref 17–63)
AST: 49 U/L — ABNORMAL HIGH (ref 15–41)
Albumin: 2.9 g/dL — ABNORMAL LOW (ref 3.5–5.0)
Alkaline Phosphatase: 113 U/L (ref 38–126)
Anion gap: 10 (ref 5–15)
BUN: 31 mg/dL — ABNORMAL HIGH (ref 6–20)
CHLORIDE: 87 mmol/L — AB (ref 101–111)
CO2: 29 mmol/L (ref 22–32)
Calcium: 8.6 mg/dL — ABNORMAL LOW (ref 8.9–10.3)
Creatinine, Ser: 1.89 mg/dL — ABNORMAL HIGH (ref 0.61–1.24)
GFR, EST AFRICAN AMERICAN: 37 mL/min — AB (ref >60)
GFR, EST NON AFRICAN AMERICAN: 32 mL/min — AB (ref >60)
Glucose, Bld: 163 mg/dL — ABNORMAL HIGH (ref 65–99)
POTASSIUM: 3.7 mmol/L (ref 3.5–5.1)
SODIUM: 126 mmol/L — AB (ref 135–145)
Total Bilirubin: 1.8 mg/dL — ABNORMAL HIGH (ref 0.3–1.2)
Total Protein: 6.3 g/dL — ABNORMAL LOW (ref 6.5–8.1)

## 2017-12-21 LAB — COOXEMETRY PANEL
CARBOXYHEMOGLOBIN: 1.7 % — AB (ref 0.5–1.5)
METHEMOGLOBIN: 1.6 % — AB (ref 0.0–1.5)
O2 Saturation: 57 %
TOTAL HEMOGLOBIN: 9.5 g/dL — AB (ref 12.0–16.0)

## 2017-12-21 LAB — GLUCOSE, CAPILLARY
GLUCOSE-CAPILLARY: 165 mg/dL — AB (ref 65–99)
GLUCOSE-CAPILLARY: 123 mg/dL — AB (ref 65–99)
Glucose-Capillary: 164 mg/dL — ABNORMAL HIGH (ref 65–99)
Glucose-Capillary: 232 mg/dL — ABNORMAL HIGH (ref 65–99)
Glucose-Capillary: 261 mg/dL — ABNORMAL HIGH (ref 65–99)
Glucose-Capillary: 189 mg/dL — ABNORMAL HIGH (ref 65–99)

## 2017-12-21 MED ORDER — DIGOXIN 0.0625 MG HALF TABLET
0.0625 mg | ORAL_TABLET | ORAL | Status: DC
  Administered 2017-12-22: 0.0625 mg via ORAL
  Filled 2017-12-21: qty 1

## 2017-12-21 MED ORDER — POTASSIUM CHLORIDE CRYS ER 20 MEQ PO TBCR
40.0000 meq | EXTENDED_RELEASE_TABLET | Freq: Once | ORAL | Status: AC
  Administered 2017-12-21: 40 meq via ORAL
  Filled 2017-12-21: qty 2

## 2017-12-21 MED ORDER — SODIUM CHLORIDE 0.9 % IV SOLN: 510.0000 mg | Freq: Once | INTRAVENOUS | Status: DC

## 2017-12-21 MED ORDER — FUROSEMIDE 10 MG/ML IJ SOLN
80.0000 mg | Freq: Two times a day (BID) | INTRAMUSCULAR | Status: DC
  Administered 2017-12-21 – 2017-12-23 (×4): 80 mg via INTRAVENOUS
  Filled 2017-12-21 (×4): qty 8

## 2017-12-21 MED ORDER — SODIUM CHLORIDE 0.9 % IV SOLN
510.0000 mg | Freq: Once | INTRAVENOUS | Status: AC
  Administered 2017-12-22: 510 mg via INTRAVENOUS
  Filled 2017-12-21 (×2): qty 17

## 2017-12-21 MED ORDER — TOLVAPTAN 15 MG PO TABS
15.0000 mg | ORAL_TABLET | Freq: Once | ORAL | Status: AC
  Administered 2017-12-21: 15 mg via ORAL
  Filled 2017-12-21: qty 1

## 2017-12-21 MED ORDER — ENSURE ENLIVE PO LIQD
237.0000 mL | Freq: Two times a day (BID) | ORAL | Status: DC
  Administered 2017-12-22: 237 mL via ORAL

## 2017-12-21 MED ORDER — FUROSEMIDE 10 MG/ML IJ SOLN
80.0000 mg | Freq: Once | INTRAMUSCULAR | Status: AC
  Administered 2017-12-21: 80 mg via INTRAVENOUS
  Filled 2017-12-21: qty 8

## 2017-12-21 NOTE — Progress Notes (Addendum)
Initial Nutrition Assessment  DOCUMENTATION CODES:   Not applicable  INTERVENTION:  Ensure Enlive po BID, each supplement provides 350 kcal and 20 grams of protein Magic cup TID with meals, each supplement provides 290 kcal and 9 grams of protein Recommend downgrade diet to D3.   NUTRITION DIAGNOSIS:   Inadequate oral intake related to poor appetite, lethargy/confusion as evidenced by meal completion < 50%.  GOAL:   Patient will meet greater than or equal to 90% of their needs  MONITOR:   PO intake, Supplement acceptance, Weight trends, Labs, I & O's, Skin  REASON FOR ASSESSMENT:   Other (Comment)(Poor PO; <50% of meals)    ASSESSMENT:   83 y.o. M admitted to [facility] from home on 01/09/18 for cardiogenic shock, hyperkalemia,  acute metabolic encephalopathy, AKI. PMH of htn, ischemic cardiomyopathy (EF 20-25% 07/20/17), CHF, RA, CKD stg III, PAF, T2DM, dementia, and hypothyroidism. PSH CABG (2000).   Per MD note pt is wheelchair bound at baseline and had a fall 12/14/17. Pt son helps with medical decisions; per palliative care note pt son wants to continue with aggressive care.  Per chart pt weight trending down; admission weight 89.6 kg, now 84.8 kg - suspected due to fluid changes.   Per chart pt regularly eating <50% of meals.    Pt hard of hearing. Pt seemed mildly confused upon dietetic intern visit. Pt reports no recent weight loss and upon asking about his usual intake pt kept repeating "I was eating good". Pt reports no decreased appetite PTA. Pt reports having difficulty eating due to arm weakness and pain.   Sitter reports pt would benefit from a supplement and that he has trouble chewing when he orders certain things. Sitter also reports that he will take "anythign with a straw". Pt open to supplementation "as long as it helps get me out of here."   Medications reviewed: lasix, novolog 0-15 units, levothyroxine, zyprexa, precedex, ultram.   Labs reviewed: Na 126 (L),  Cl 87 (L), BG 163 (H), BUN 31 (H), creatinine 1.89 (H), AST 49 (H), ALT 227 (H), total bilirubin 1.8 (H), GFR 32 (L), WBC 11 (H), RBC 3.65 (L), hemoglobin 9.2 (L), HCT 29.1 (L), platelets 144 (L).   Net I/O since admit: -5.7 L  Lab Results  Component Value Date   HGBA1C 7.1 (H) 04/02/2017   NUTRITION - FOCUSED PHYSICAL EXAM:  No fat or muscle depletions, mild edema.  Diet Order:   Diet Order           Diet 2 gram sodium Room service appropriate? Yes; Fluid consistency: Thin; Fluid restriction: 1800 mL Fluid  Diet effective now          EDUCATION NEEDS:   Education needs have been addressed  Skin:  Skin Assessment: Skin Integrity Issues: Skin Integrity Issues:: DTI DTI: coccyx  Last BM:  12/18/17  Height:   Ht Readings from Last 1 Encounters:  12/16/17 5\' 10"  (1.778 m)    Weight:   Wt Readings from Last 1 Encounters:  12/21/17 186 lb 15.2 oz (84.8 kg)   UBW: 186 - 204 lbs: weight fluctuations likely due to fluid changes %UBW: 100%  Ideal Body Weight:  75.45 kg  BMI:  Body mass index is 26.82 kg/m.  Estimated Nutritional Needs:   Kcal:  1800-2000 kcal  Protein:  85-100 grams  Fluid:  1.8 L    02/20/18, Dietetic Intern

## 2017-12-21 NOTE — Progress Notes (Signed)
Daily Progress Note   Patient Name: [name]       Date: 12/21/2017 DOB: [birth year]  Age: 83 y.o. MRN#: [id number] Attending Physician: Coralyn Helling, MD Primary Care Physician: [name], MD Admit Date: 01/03/18  Reason for Consultation/Follow-up: Establishing goals of care and Non pain symptom management  Subjective: Patient is sleeping this morning after precedex being stopped for the first time. Per RN- he slept for a solid 2 hours last night. This is about the most he has slept during admission. He was awake and alert this morning, but confused, a little paranoid. He has been noted to be coughing while eating and now has increased cough producing thick secretions. His WBC is up today. Dobutamine is weaning down.   Review of Systems  Unable to perform ROS: Acuity of condition    Length of Stay: 6  Current Medications: Scheduled Meds:  . amiodarone  200 mg Oral Daily  . apixaban  2.5 mg Oral BID  . atorvastatin  10 mg Oral q1800  . Chlorhexidine Gluconate Cloth  6 each Topical Daily  . [START ON 12/22/2017] digoxin  0.0625 mg Oral QODAY  . furosemide  80 mg Intravenous BID  . insulin aspart  0-15 Units Subcutaneous Q4H  . levothyroxine  75 mcg Oral QAC breakfast  . OLANZapine zydis  2.5 mg Oral QHS  . sodium chloride flush  10-40 mL Intracatheter Q12H    Continuous Infusions: . sodium chloride Stopped (12/17/17 1000)  . dexmedetomidine (PRECEDEX) IV infusion Stopped (12/21/17 1015)  . DOBUTamine 1.5 mcg/kg/min (12/21/17 0759)  . [START ON 12/22/2017] ferumoxytol      PRN Meds: sodium chloride, acetaminophen, bisacodyl, sodium chloride flush, traMADol  Physical Exam  Constitutional: He appears well-developed and well-nourished.  Cardiovascular: Normal rate and  regular rhythm.  hypotensive  Pulmonary/Chest: Effort normal.  Lungs diminished  Abdominal: Soft.  Musculoskeletal: He exhibits edema.  Skin: Skin is warm. There is pallor.  Nursing note and vitals reviewed.           Vital Signs: BP 100/62   Pulse 74   Temp 98.8 F (37.1 C) (Oral)   Resp 17   Ht 5\' 10"  (1.778 m)   Wt 84.8 kg (186 lb 15.2 oz)   SpO2 100%   BMI 26.82 kg/m  SpO2: SpO2: 100 %  O2 Device: O2 Device: Room Air O2 Flow Rate:    Intake/output summary:   Intake/Output Summary (Last 24 hours) at 12/21/2017 1148 Last data filed at 12/21/2017 0915 Gross per 24 hour  Intake 2205.31 ml  Output 695 ml  Net 1510.31 ml   LBM: Last BM Date: 12/18/17 Baseline Weight: Weight: 89.6 kg (197 lb 8.5 oz) Most recent weight: Weight: 84.8 kg (186 lb 15.2 oz)       Palliative Assessment/Data: PPS: 30%    Flowsheet Rows     Most Recent Value  Intake Tab  Referral Department  Hospitalist  Unit at Time of Referral  ICU  Palliative Care Primary Diagnosis  Cardiac  Date Notified  12/16/17  Palliative Care Type  Return patient Palliative Care  Reason for referral  Clarify Goals of Care  Date of Admission  12/16/17  Date first seen by Palliative Care  12/16/17  # of days Palliative referral response time  0 Day(s)  # of days IP prior to Palliative referral  0  Clinical Assessment  Palliative Performance Scale Score  30%  Psychosocial & Spiritual Assessment  Palliative Care Outcomes  Patient/Family meeting held?  No      Patient Active Problem List   Diagnosis Date Noted  . Advanced care planning/counseling discussion   . Acute metabolic encephalopathy   . Agitation requiring sedation protocol   . Acute on chronic respiratory failure with hypoxemia (HCC)   . Palliative care by specialist   . Arthralgia of both lower legs   . Hypokalemia   . Hyperkalemia 01-06-2018  . Pressure injury of skin Jan 06, 2018  . Palliative care encounter   . Goals of care,  counseling/discussion   . Acute on chronic diastolic congestive heart failure (HCC) 09/09/2017  . Acute on chronic systolic heart failure (HCC) 08/25/2017  . CHF (congestive heart failure) (HCC) 07/19/2017  . Hyponatremia 04/02/2017  . Normocytic anemia 04/02/2017  . OA (osteoarthritis) 11/19/2016  . Paroxysmal atrial fibrillation (HCC) 09/10/2016  . CKD (chronic kidney disease), stage III (HCC) 09/10/2016  . Physical deconditioning 09/10/2016  . Cardiogenic shock (HCC)   . Acute renal failure with acute renal cortical necrosis superimposed on stage 3 chronic kidney disease (HCC) 08/05/2016  . Single implantable cardioverter-defibrillator BSX    . Ventricular tachycardia-treated the ATP 01/29/2011  . Hx of CABG 04/17/2009  . Cardiomyopathy, ischemic 04/17/2009  . Acute on chronic systolic CHF (congestive heart failure) (HCC) 04/17/2009  . Insulin dependent diabetes mellitus (HCC) 04/12/2009  . Hyperlipidemia LDL goal <70 04/12/2009  . Essential hypertension 04/12/2009  . PUD 04/12/2009  . Rheumatoid arthritis (HCC) 04/12/2009    Palliative Care Assessment & Plan   Patient Profile: 83 y.o.malewith past medical history of ICM with EF 20-25% s/p AICD, CAD s/p CABG, HTN, RA, CKD III, who has multiple recent hospitalizations for CHF and is now readmitted onMay 24, 2019with cardiogenic shock requiring pressors. Palliative care was consulted to help address goals.  Assessment/Recommendations/Plan   Continue olanzapine 2.5mg  qhs for anxiety and delirium  Will check chest xray to ensure there is no developing pneumonia- this could be helpful for continued talks with family regarding patient's prognosis  SLP eval for possible aspiration  PMT will continue to follow and participate in discussions with patient's son, [name]. [name] notes that as long as patient is able to get out of bed and into a chair he wishes to continue aggressive care and hopes for d/c to rehab.  Goals of Care  and Additional Recommendations:  Limitations  on Scope of Treatment: Full Scope Treatment  Code Status:  Limited code- bipap ok  Prognosis:   Unable to determine  Discharge Planning:  To Be Determined  Care plan was discussed with patient's RN, patient's sonEarl Lites.   Thank you for allowing the Palliative Medicine Team to assist in the care of this patient.   Time In: 0930 Time Out: 1005 Total Time 35 mins Prolonged Time Billed no      Greater than 50%  of this time was spent counseling and coordinating care related to the above assessment and plan.  Ocie Bob, AGNP-C Palliative Medicine   Please contact Palliative Medicine Team phone at [phone] for questions and concerns.

## 2017-12-21 NOTE — Plan of Care (Signed)
Pt is diuresing greater than 30cc of urine an hour w/ support of IV lasix. Pt is being turned q2 in r/t to skin integrity. Pt is is WDL r/t hemodynamics w/ support of Dobutamine. Pt continues to need a Recruitment consultant and has to be redirected by staff continuously. Pt does not understand fluid restrictions and continuously asks for PO fluids.

## 2017-12-21 NOTE — Progress Notes (Signed)
PCCM Progress Note  Admission date: 12/21/2017 Referring provider: Duke Salvia CC: Weakness  HPI: 83 yo male with recurrent admissions for CHF presented to [facility] with weakness and swelling with bradycardia. PMHx of CAD s/p CABG, HTN, systolic CHF with EF 20 to 25%, RA, Stage 3 CKD, PAS on eliquis, DM, hypothyroidism.  Subjective: Back on precedex overnight.  Vital signs: BP 100/62   Pulse 74   Temp 99.1 F (37.3 C) (Oral)   Resp 17   Ht 5\' 10"  (1.778 m)   Wt 186 lb 15.2 oz (84.8 kg)   SpO2 100%   BMI 26.82 kg/m   Intake/outpt: I/O last 3 completed shifts: In: 3056.2 [P.O.:2050; I.V.:1006.2] Out: 3455 [Urine:3455]  Physical exam:  General - sedated Eyes - pupils reactive ENT - dry mucosa Cardiac - regular, no murmur Chest - no wheeze, rales Abd - soft, non tender Ext - no edema Skin - no rashes Neuro - RASS -3     CBC Recent Labs    12/19/17 0406 12/20/17 0457 12/21/17 0429  WBC 4.7 6.7 11.0*  HGB 9.2* 9.5* 9.2*  HCT 28.7* 30.0* 29.1*  PLT 144* 162 144*    Coag's No results for input(s): APTT, INR in the last 72 hours.  BMET Recent Labs    12/19/17 0406 12/20/17 0457 12/21/17 0429  NA 128* 129* 126*  K 4.0 5.0 3.7  CL 87* 89* 87*  CO2 30 29 29   BUN 35* 29* 31*  CREATININE 1.91* 1.77* 1.89*  GLUCOSE 172* 127* 163*    Electrolytes Recent Labs    12/19/17 0406 12/20/17 0457 12/21/17 0429  CALCIUM 8.3* 8.6* 8.6*  MG  --  2.2  --     Sepsis Markers No results for input(s): PROCALCITON, O2SATVEN in the last 72 hours.  Invalid input(s): LACTICACIDVEN  ABG No results for input(s): PHART, PCO2ART, PO2ART in the last 72 hours.  Liver Enzymes Recent Labs    12/19/17 0406 12/20/17 0457 12/21/17 0429  AST 139* 90* 49*  ALT 449* 328* 227*  ALKPHOS 154* 141* 113  BILITOT 1.7* 2.1* 1.8*  ALBUMIN 3.0* 3.1* 2.9*    Cardiac Enzymes No results for input(s): TROPONINI, PROBNP in the last 72 hours.  Glucose Recent Labs     12/20/17 1222 12/20/17 1615 12/20/17 1930 12/20/17 2319 12/21/17 0340 12/21/17 0727  GLUCAP 157* 134* 193* 189* 165* 164*    Imaging No results found.   Studies: Echo 5/03 >> mild LVH, EF 25%, mild MR, PAS 57 mmHg  Lines/tubes: Rt IJ CVL 5/02 >>  Assessment/plan:  Acute on chronic systolic CHF with cardiogenic shock. Hx of CAD s/p CABG, PAF. - per cardiology  Acute renal failure with ATN. CKD 3 - baseline creatinine 1.8. Hyponatremia in setting of CHF. - f/u BMET  Elevated LFTs 2nd to shock. - improving - f/u LFT's intermittently  Anemia of critical illness and chronic disease. - f/u CBC intermittently  DM. Hypothyroidism. - continue synthroid - SSI  Acute metabolic encephalopathy. - likely from hyponatremia and hypotension - try to limit precedex  DVT prophylaxis - heparin gtt SUP - not indiciated Nutrition - 2 gram sodium Goals of care - DNR/DNI, palliative care consulted  7/03, MD [facility] Pulmonary/Critical Care 12/21/2017, 10:01 AM

## 2017-12-21 NOTE — Progress Notes (Signed)
Patient remains confused and hallucinating throughout the day with sitter at the bedside. Precedex wean attempted but due to agitation and confusion was restarted approximately 3 hrs later. Evaluated by speech therapy due to coughing with intake, diet adjusted accordingly. Will continue to monitor.

## 2017-12-21 NOTE — Progress Notes (Signed)
eLink Physician-Brief Progress Note Patient Name: [name] DOB: 05/20/1935 MRN: [id number]   Date of Service  12/21/2017  HPI/Events of Note  Oliguria - Bladder scan with 185 mL. CVP = 15-20. BP = 105/69 with MAP = 81. LVEF = 25%  eICU Interventions  Will order: 1. Lasix 80 mg IV X 1 now.      Intervention Category Intermediate Interventions: Oliguria - evaluation and management  Laurence Folz Eugene 12/21/2017, 1:09 AM

## 2017-12-21 NOTE — Progress Notes (Signed)
RN and 2 NTs at bedside in attempt to stand patient and ambulate to chair but patient refused. Patient dangled on the side of the bed for approximately 2 mins.

## 2017-12-21 NOTE — Progress Notes (Addendum)
Patient ID: [name], male   DOB: [birth year], 83 y.o.   MRN: [id number]     Advanced Heart Failure Rounding Note  PCP-Cardiologist: Olga Millers, MD   Subjective:    Admitted with volume overload. He was in cardiogenic shock.   Started on milrinone but later switched to dobutamine due to hypotension. Norepi later added. Norepinephrine weaned off on 5/3.  Coox 57.0% this am on dobutamine 2.5 mcg/kg/min.  Creatinine 1.89. CVP 12-13. I/O + 1.5 L.   Confused this am. Asking about leaving the hospital and when he can go home.   Hgb 9.2.   Na 122 -> Tolvaptan -> 128 -> 129 -> 126.  Objective:   Weight Range: 186 lb 15.2 oz (84.8 kg) Body mass index is 26.82 kg/m.   Vital Signs:   Temp:  [98.1 F (36.7 C)-99.1 F (37.3 C)] 99.1 F (37.3 C) (05/08 0342) Pulse Rate:  [38-109] 80 (05/08 0700) Resp:  [15-21] 19 (05/08 0700) BP: (81-126)/(46-86) 89/74 (05/08 0700) SpO2:  [77 %-100 %] 99 % (05/08 0700) Weight:  [186 lb 15.2 oz (84.8 kg)] 186 lb 15.2 oz (84.8 kg) (05/08 0404) Last BM Date: 12/18/17  Weight change: Filed Weights   12/19/17 0400 12/20/17 0500 12/21/17 0404  Weight: 189 lb 13.1 oz (86.1 kg) 184 lb 1.4 oz (83.5 kg) 186 lb 15.2 oz (84.8 kg)    Intake/Output:   Intake/Output Summary (Last 24 hours) at 12/21/2017 0750 Last data filed at 12/21/2017 0740 Gross per 24 hour  Intake 2622.41 ml  Output 980 ml  Net 1642.41 ml    Physical Exam   CVP 12-13  General: Chronically ill appearing. No resp difficulty. HEENT: Normal Neck: Supple. JVP 12 cm +. Carotids 2+ bilat; no bruits. No thyromegaly or nodule noted. Cor: PMI nondisplaced. RRR, No M/G/R noted Lungs: CTAB, normal effort. Abdomen: Soft, non-tender, non-distended, no HSM. No bruits or masses. +BS  Extremities: No cyanosis, clubbing, or rash. 1-2+ edema Neuro: Alert & orientedx3, cranial nerves grossly intact. moves all 4 extremities w/o difficulty. Affect pleasant   Telemetry   NSR 70-80s,  personally reviewed.   EKG    No new tracings.    Labs    CBC Recent Labs    12/19/17 0406 12/20/17 0457 12/21/17 0429  WBC 4.7 6.7 11.0*  NEUTROABS 3.9  --   --   HGB 9.2* 9.5* 9.2*  HCT 28.7* 30.0* 29.1*  MCV 79.7 80.6 79.7  PLT 144* 162 144*   Basic Metabolic Panel Recent Labs    37/16/96 0457 12/21/17 0429  NA 129* 126*  K 5.0 3.7  CL 89* 87*  CO2 29 29  GLUCOSE 127* 163*  BUN 29* 31*  CREATININE 1.77* 1.89*  CALCIUM 8.6* 8.6*  MG 2.2  --    Liver Function Tests Recent Labs    12/20/17 0457 12/21/17 0429  AST 90* 49*  ALT 328* 227*  ALKPHOS 141* 113  BILITOT 2.1* 1.8*  PROT 6.7 6.3*  ALBUMIN 3.1* 2.9*   No results for input(s): LIPASE, AMYLASE in the last 72 hours. Cardiac Enzymes No results for input(s): CKTOTAL, CKMB, CKMBINDEX, TROPONINI in the last 72 hours.  BNP: BNP (last 3 results) Recent Labs    08/25/17 1140 09/09/17 0525 12/16/17 0345  BNP 1,102.9* 1,105.4* 1,012.3*    ProBNP (last 3 results) No results for input(s): PROBNP in the last 8760 hours.   D-Dimer No results for input(s): DDIMER in the last 72 hours. Hemoglobin A1C No results for  input(s): HGBA1C in the last 72 hours. Fasting Lipid Panel No results for input(s): CHOL, HDL, LDLCALC, TRIG, CHOLHDL, LDLDIRECT in the last 72 hours. Thyroid Function Tests No results for input(s): TSH, T4TOTAL, T3FREE, THYROIDAB in the last 72 hours.  Invalid input(s): FREET3  Other results:   Imaging    No results found.   Medications:     Scheduled Medications: . amiodarone  200 mg Oral Daily  . apixaban  2.5 mg Oral BID  . atorvastatin  10 mg Oral q1800  . Chlorhexidine Gluconate Cloth  6 each Topical Daily  . digoxin  0.0625 mg Oral Daily  . insulin aspart  0-15 Units Subcutaneous Q4H  . levothyroxine  75 mcg Oral QAC breakfast  . OLANZapine zydis  2.5 mg Oral QHS  . sodium chloride flush  10-40 mL Intracatheter Q12H  . torsemide  100 mg Oral BID     Infusions: . sodium chloride Stopped (12/17/17 1000)  . dexmedetomidine (PRECEDEX) IV infusion 0.7 mcg/kg/hr (12/21/17 0724)  . DOBUTamine 2.5 mcg/kg/min (12/21/17 0700)    PRN Medications: sodium chloride, acetaminophen, bisacodyl, sodium chloride flush, traMADol    Patient Profile   Raymond Saefong Jonesis a 82 y.o.malewith CAD s/p CABG, hypertension, hyperlipidemia and chronic systolic and diastolic heart failure presenting today with sob at rest and increased leg edema.    Assessment/Plan   1. Acute on chronic systolic CHF with cardiogenic shock: EF 20-25% in 12/18, ischemic cardiomyopathy.  Echo this admission with EF 25%, mild MR, mildly dilated RV, PASP 57 mmHg. - Coox 57.0% this am on dobutamine 2.5 mcg/kg/min. Will wean to 1.5 mcg/kg/min - CVP 12-13 - Will put back on IV lasix 80 mg BID.  - Continue digoxin 0.125 mg daily.  - Home spironolactone and Coreg on hold for now => creatinine at baseline. 2. CAD: s/p CABG.  Mild TnI elevation likely demand ischemia.  Coronary angiography 1/18 with patent graft and no targets for revascularization. - Statin held for now with shock liver => can restart with downtrend in LFTs.   - Getting ASA 81 daily.  - No change to current plan.   3. Atrial fibrillation: Paroxysmal.   - Remains in NSR.   - Continue amiodarone 200 daily to keep in NSR, elevated LFTs from shock liver and trending down.  - He is back on apixaban.  Hemoglobin down but no overt bleeding.  4. Elevated LFTs:  - Suspect shock liver. Support cardiac output.  - LFTs trending down.   5. Hyponatremia: Hypervolemic hyponatremia. - 122 - > 128 > 129 > 126 after dose of Tolvaptan x 2 days.  6. Delirium: - Worse 5/5 in setting of worsening hyponatremia, now improved.   - Reinforced fluid restriction.  - Na back down to 126. Will repeat Tolvaptan (Reviewed with MD, appropriate) - On Precedex.   7. AKI: Suspect cardiorenal with cardiogenic shock.  - Creatinine 1.77 ->  1.89 today. Baseline creatinine around 2.0.  8. Code status: DNR/DNI.  Palliative care has seen. Hospice discussions begun, son not ready yet to turn off ICD.  Will definitely need SNF at discharge (will be unable to return home).   - No change to current plan.   9. Hypokalemia:  - 3.7 today. Supp gently.  10. Anemia:  - Hgb 7.6 -> 9.2 -> 9.5 -> 9.2. - No signs of overt bleeding.  Suspect chronic disease, renal disease play a role.   - Received feraheme 12/19/17 with low Iron sat.  - FOBT pending  Graciella Freer, PA-C  12/21/2017 7:50 AM   Advanced Heart Failure Team Pager [phone] (M-F; 7a - 4p)  Please contact [facility] for night-coverage after hours (4p -7a ) and weekends on [email or web address]  Patient seen with PA, agree with the above note.  Co-ox 57% today with CVP 12-13.  He is more confused today, sodium lower back to 126.   On exam, regular S1S2.  JVP 10 cm.  Trace ankle edema.   Today, will decrease dobutamine to 1.5 mcg/kg/min. He is back on digoxin 0.0625 mg daily.   I will give him Lasix 80 mg IV bid with a dose of tolvaptan 15 mg.   He is back on apixaban 2.5 mg bid, in NSR on amiodarone.    Will need SNF.   Marca Ancona 12/21/2017 8:40 AM

## 2017-12-22 ENCOUNTER — Telehealth (HOSPITAL_COMMUNITY): Payer: Self-pay

## 2017-12-22 ENCOUNTER — Encounter (HOSPITAL_COMMUNITY): Payer: Self-pay

## 2017-12-22 DIAGNOSIS — I5043 Acute on chronic combined systolic (congestive) and diastolic (congestive) heart failure: Secondary | ICD-10-CM

## 2017-12-22 LAB — COMPREHENSIVE METABOLIC PANEL
ALBUMIN: 2.5 g/dL — AB (ref 3.5–5.0)
ALK PHOS: 104 U/L (ref 38–126)
ALT: 166 U/L — AB (ref 17–63)
AST: 39 U/L (ref 15–41)
Anion gap: 10 (ref 5–15)
BILIRUBIN TOTAL: 1.5 mg/dL — AB (ref 0.3–1.2)
BUN: 37 mg/dL — ABNORMAL HIGH (ref 6–20)
CALCIUM: 8.4 mg/dL — AB (ref 8.9–10.3)
CO2: 28 mmol/L (ref 22–32)
CREATININE: 2.03 mg/dL — AB (ref 0.61–1.24)
Chloride: 87 mmol/L — ABNORMAL LOW (ref 101–111)
GFR calc non Af Amer: 29 mL/min — ABNORMAL LOW (ref >60)
GFR, EST AFRICAN AMERICAN: 34 mL/min — AB (ref >60)
GLUCOSE: 205 mg/dL — AB (ref 65–99)
Potassium: 4.1 mmol/L (ref 3.5–5.1)
Sodium: 125 mmol/L — ABNORMAL LOW (ref 135–145)
TOTAL PROTEIN: 5.6 g/dL — AB (ref 6.5–8.1)

## 2017-12-22 LAB — CBC
HEMATOCRIT: 28.7 % — AB (ref 39.0–52.0)
HEMOGLOBIN: 9.2 g/dL — AB (ref 13.0–17.0)
MCH: 25.6 pg — ABNORMAL LOW (ref 26.0–34.0)
MCHC: 32.1 g/dL (ref 30.0–36.0)
MCV: 79.9 fL (ref 78.0–100.0)
Platelets: 120 10*3/uL — ABNORMAL LOW (ref 150–400)
RBC: 3.59 MIL/uL — ABNORMAL LOW (ref 4.22–5.81)
RDW: 19 % — AB (ref 11.5–15.5)
WBC: 7.4 10*3/uL (ref 4.0–10.5)

## 2017-12-22 LAB — GLUCOSE, CAPILLARY
GLUCOSE-CAPILLARY: 198 mg/dL — AB (ref 65–99)
Glucose-Capillary: 193 mg/dL — ABNORMAL HIGH (ref 65–99)
Glucose-Capillary: 165 mg/dL — ABNORMAL HIGH (ref 65–99)
Glucose-Capillary: 225 mg/dL — ABNORMAL HIGH (ref 65–99)
Glucose-Capillary: 238 mg/dL — ABNORMAL HIGH (ref 65–99)

## 2017-12-22 LAB — COOXEMETRY PANEL
CARBOXYHEMOGLOBIN: 2.1 % — AB (ref 0.5–1.5)
Methemoglobin: 1 % (ref 0.0–1.5)
O2 SAT: 57.4 %
TOTAL HEMOGLOBIN: 9.4 g/dL — AB (ref 12.0–16.0)

## 2017-12-22 MED ORDER — CHLORHEXIDINE GLUCONATE 0.12 % MT SOLN
15.0000 mL | Freq: Two times a day (BID) | OROMUCOSAL | Status: DC
  Administered 2017-12-22 – 2017-12-23 (×4): 15 mL via OROMUCOSAL

## 2017-12-22 MED ORDER — ORAL CARE MOUTH RINSE
15.0000 mL | Freq: Two times a day (BID) | OROMUCOSAL | Status: DC
  Administered 2017-12-22: 15 mL via OROMUCOSAL

## 2017-12-22 MED ORDER — TOLVAPTAN 15 MG PO TABS
30.0000 mg | ORAL_TABLET | Freq: Once | ORAL | Status: AC
  Administered 2017-12-22: 30 mg via ORAL
  Filled 2017-12-22: qty 2

## 2017-12-22 NOTE — Progress Notes (Signed)
PCCM Progress Note  Admission date: 12-18-17 Referring provider: Duke Salvia CC: Weakness  HPI: 83 yo male with recurrent admissions for CHF presented to [facility] with weakness and swelling with bradycardia.  PMHx of CAD s/p CABG, HTN, systolic CHF with EF 20 to 25%, RA, Stage 3 CKD, PAS on eliquis, DM, hypothyroidism.  Subjective: Remains on precedex.  Vital signs: BP (!) 106/59   Pulse 86   Temp 98.2 F (36.8 C) (Oral)   Resp 18   Ht 5\' 10"  (1.778 m)   Wt 196 lb (88.9 kg)   SpO2 100%   BMI 28.12 kg/m   Intake/outpt: I/O last 3 completed shifts: In: 2459.8 [P.O.:1967; I.V.:492.8] Out: 1780 [Urine:1780]  Physical exam:  General - sedated Eyes - pupils reactive ENT - no sinus tenderness, no oral exudate, no LAN Cardiac - regular, no murmur Chest - no wheeze, rales Abd - soft, non tender Ext - no edema Skin - no rashes Neuro - pleasantly confused  Labs: CBC Recent Labs    12/20/17 0457 12/21/17 0429 12/22/17 0310  WBC 6.7 11.0* 7.4  HGB 9.5* 9.2* 9.2*  HCT 30.0* 29.1* 28.7*  PLT 162 144* 120*    Coag's No results for input(s): APTT, INR in the last 72 hours.  BMET Recent Labs    12/20/17 0457 12/21/17 0429 12/22/17 0310  NA 129* 126* 125*  K 5.0 3.7 4.1  CL 89* 87* 87*  CO2 29 29 28   BUN 29* 31* 37*  CREATININE 1.77* 1.89* 2.03*  GLUCOSE 127* 163* 205*    Electrolytes Recent Labs    12/20/17 0457 12/21/17 0429 12/22/17 0310  CALCIUM 8.6* 8.6* 8.4*  MG 2.2  --   --     Sepsis Markers No results for input(s): PROCALCITON, O2SATVEN in the last 72 hours.  Invalid input(s): LACTICACIDVEN  ABG No results for input(s): PHART, PCO2ART, PO2ART in the last 72 hours.  Liver Enzymes Recent Labs    12/20/17 0457 12/21/17 0429 12/22/17 0310  AST 90* 49* 39  ALT 328* 227* 166*  ALKPHOS 141* 113 104  BILITOT 2.1* 1.8* 1.5*  ALBUMIN 3.1* 2.9* 2.5*    Cardiac Enzymes No results for input(s): TROPONINI, PROBNP in the last 72  hours.  Glucose Recent Labs    12/21/17 1140 12/21/17 1536 12/21/17 1956 12/21/17 2329 12/22/17 0306 12/22/17 0736  GLUCAP 123* 232* 261* 189* 193* 165*    Imaging Dg Chest Port 1 View  Result Date: 12/21/2017 CLINICAL DATA:  Productive cough. EXAM: PORTABLE CHEST 1 VIEW COMPARISON:  Radiograph of Dec 19, 2017. FINDINGS: Stable cardiomegaly with central pulmonary vascular congestion. Single lead left-sided pacemaker is unchanged in position. No pneumothorax or pleural effusion is noted. Right internal jugular catheter is unchanged in position. No acute pulmonary disease is noted. Bony thorax is unremarkable. IMPRESSION: Stable cardiomegaly with central pulmonary vascular congestion. Electronically Signed   By: 02/20/2018, M.D.   On: 12/21/2017 14:39   Studies: Echo 5/03 >> mild LVH, EF 25%, mild MR, PAS 57 mmHg  Lines/tubes: Rt IJ CVL 5/02 >>  Assessment/plan:  Acute on chronic systolic CHF with cardiogenic shock. Hx of CAD s/p CABG, PAF. - d/w cardiology > not making any progress  Acute renal failure with ATN. CKD 3 - baseline creatinine 1.8. Hyponatremia in setting of CHF. - f/u BMET  Anemia of critical illness and chronic disease. - f/u CBC intermittently  DM. Hypothyroidism. - continue synthroid - SSI  Acute metabolic encephalopathy. - likely from hyponatremia and  hypotension - stuck on precedex  DVT prophylaxis - heparin gtt SUP - not indiciated Nutrition - 2 gram sodium Goals of care - DNR/DNI, palliative care consulted.  D/w [name].  Not making any progress.  Not sure he can improve beyond his current status.  Need to have further d/w family about goals of care.  Coralyn Helling, MD [facility] Pulmonary/Critical Care 12/22/2017, 10:41 AM

## 2017-12-22 NOTE — Progress Notes (Signed)
Palliative:  I met today with Mr. Sherrer who is lying in bed, sitter at bedside, continues on Precedex. He is slightly confused but does understand that he is critically ill. He makes statements such as "I'm not sure how much longer I can go on like this" and often begins praying for himself and the medical staff and for healing. He seems to understand that he is unlikely to survive this event but continues to pray for healing and improvement. Emotional support provided.   I spoke with son, Belenda Cruise, and explained gently that we do not seem to be making any progress in making his father any better.Belenda Cruise asks "well they're going to keep trying aren't they?".  I explained that we have been aggressive but his father is still tied to these medications in ICU and his body is not responded as well to them as we need it too. I explained that we are likely running out of options to make this any better so there is not much else "to try." Belenda Cruise says that he was hopeful that his father could improve enough to make it to [name] and try and get stronger. He understands that we fear this is unlikely to happen. We plan to meet tomorrow 5/10 ~12-13 pm to further discuss progress and where to go from here.   20 min  Vinie Sill, NP Palliative Medicine Team Pager # [phone] (M-F 8a-5p) Team Phone # [phone] (Nights/Weekends)

## 2017-12-22 NOTE — Telephone Encounter (Signed)
Notes recorded by Teresa Coombs, RN on 12/13/2017 at 10:41 AM EDT Pt son aware of results, agreeable to med changes (changes made in [facility]), Labs drawn today (if we do not call you, then your lab work was stable)   ------  Notes recorded by Laurey Morale, MD on 12/12/2017 at 10:40 PM EDT Increase total daily KCl by 20 meq. BMET 10 days.

## 2017-12-22 NOTE — Progress Notes (Addendum)
Patient ID: [name], male   DOB: [birth year], 83 y.o.   MRN: [id number]     Advanced Heart Failure Rounding Note  PCP-Cardiologist: Olga Millers, MD   Subjective:    Admitted with volume overload. He was in cardiogenic shock.   Started on milrinone but later switched to dobutamine due to hypotension. Norepi later added. Norepinephrine weaned off on 5/3.  Coox 57.4% this am on dobutamine 1.5 mcg/kg/min.  Creatinine 2.03. CVP 11-12. I/O + 350 cc. Weight shows up 10 lbs.   Confused this am but knows that he is in [facility]. Denies SOB. Complaining about fluid restrictions.   Hgb 9.2  Na 122 -> Tolvaptan -> 128 -> 129 -> 126 -> Tolvaptan -> 125  Objective:   Weight Range: 196 lb (88.9 kg) Body mass index is 28.12 kg/m.   Vital Signs:   Temp:  [98.2 F (36.8 C)-98.8 F (37.1 C)] 98.2 F (36.8 C) (05/09 0308) Pulse Rate:  [70-86] 72 (05/09 0600) Resp:  [15-27] 27 (05/09 0600) BP: (82-100)/(50-62) 89/55 (05/09 0600) SpO2:  [94 %-100 %] 100 % (05/09 0600) Weight:  [196 lb (88.9 kg)] 196 lb (88.9 kg) (05/09 0327) Last BM Date: 12/18/17  Weight change: Filed Weights   12/20/17 0500 12/21/17 0404 12/22/17 0327  Weight: 184 lb 1.4 oz (83.5 kg) 186 lb 15.2 oz (84.8 kg) 196 lb (88.9 kg)    Intake/Output:   Intake/Output Summary (Last 24 hours) at 12/22/2017 0740 Last data filed at 12/22/2017 0600 Gross per 24 hour  Intake 1740.83 ml  Output 1530 ml  Net 210.83 ml    Physical Exam   CVP 11-12  General: Chronically ill appearing. No resp difficulty. HEENT: Normal Neck: Supple. JVP 11-12 cm. Carotids 2+ bilat; no bruits. No thyromegaly or nodule noted. Cor: PMI nondisplaced. RRR, No M/G/R noted Lungs: CTAB, normal effort. Abdomen: Soft, non-tender, non-distended, no HSM. No bruits or masses. +BS  Extremities: No cyanosis, clubbing, or rash. 1-2+ edema.  Neuro: Alert & orientedx3, cranial nerves grossly intact. moves all 4 extremities w/o difficulty. Affect  pleasant   Telemetry   NSR 70-80s, personally reviewed.   EKG    No new tracings.    Labs    CBC Recent Labs    12/21/17 0429 12/22/17 0310  WBC 11.0* 7.4  HGB 9.2* 9.2*  HCT 29.1* 28.7*  MCV 79.7 79.9  PLT 144* 120*   Basic Metabolic Panel Recent Labs    78/67/54 0457 12/21/17 0429 12/22/17 0310  NA 129* 126* 125*  K 5.0 3.7 4.1  CL 89* 87* 87*  CO2 29 29 28   GLUCOSE 127* 163* 205*  BUN 29* 31* 37*  CREATININE 1.77* 1.89* 2.03*  CALCIUM 8.6* 8.6* 8.4*  MG 2.2  --   --    Liver Function Tests Recent Labs    12/21/17 0429 12/22/17 0310  AST 49* 39  ALT 227* 166*  ALKPHOS 113 104  BILITOT 1.8* 1.5*  PROT 6.3* 5.6*  ALBUMIN 2.9* 2.5*   No results for input(s): LIPASE, AMYLASE in the last 72 hours. Cardiac Enzymes No results for input(s): CKTOTAL, CKMB, CKMBINDEX, TROPONINI in the last 72 hours.  BNP: BNP (last 3 results) Recent Labs    08/25/17 1140 09/09/17 0525 12/16/17 0345  BNP 1,102.9* 1,105.4* 1,012.3*    ProBNP (last 3 results) No results for input(s): PROBNP in the last 8760 hours.   D-Dimer No results for input(s): DDIMER in the last 72 hours. Hemoglobin A1C No results for  input(s): HGBA1C in the last 72 hours. Fasting Lipid Panel No results for input(s): CHOL, HDL, LDLCALC, TRIG, CHOLHDL, LDLDIRECT in the last 72 hours. Thyroid Function Tests No results for input(s): TSH, T4TOTAL, T3FREE, THYROIDAB in the last 72 hours.  Invalid input(s): FREET3  Other results:   Imaging    Dg Chest Port 1 View  Result Date: 12/21/2017 CLINICAL DATA:  Productive cough. EXAM: PORTABLE CHEST 1 VIEW COMPARISON:  Radiograph of Dec 19, 2017. FINDINGS: Stable cardiomegaly with central pulmonary vascular congestion. Single lead left-sided pacemaker is unchanged in position. No pneumothorax or pleural effusion is noted. Right internal jugular catheter is unchanged in position. No acute pulmonary disease is noted. Bony thorax is unremarkable.  IMPRESSION: Stable cardiomegaly with central pulmonary vascular congestion. Electronically Signed   By: Lupita Raider, M.D.   On: 12/21/2017 14:39     Medications:     Scheduled Medications: . amiodarone  200 mg Oral Daily  . apixaban  2.5 mg Oral BID  . atorvastatin  10 mg Oral q1800  . Chlorhexidine Gluconate Cloth  6 each Topical Daily  . digoxin  0.0625 mg Oral QODAY  . feeding supplement (ENSURE ENLIVE)  237 mL Oral BID BM  . furosemide  80 mg Intravenous BID  . insulin aspart  0-15 Units Subcutaneous Q4H  . levothyroxine  75 mcg Oral QAC breakfast  . OLANZapine zydis  2.5 mg Oral QHS  . sodium chloride flush  10-40 mL Intracatheter Q12H    Infusions: . sodium chloride Stopped (12/17/17 1000)  . dexmedetomidine (PRECEDEX) IV infusion 0.401 mcg/kg/hr (12/22/17 0600)  . DOBUTamine 1.5 mcg/kg/min (12/22/17 0600)  . ferumoxytol      PRN Medications: sodium chloride, acetaminophen, bisacodyl, sodium chloride flush, traMADol    Patient Profile   Raymond Schaab Jonesis a 83 y.o.malewith CAD s/p CABG, hypertension, hyperlipidemia and chronic systolic and diastolic heart failure presenting today with sob at rest and increased leg edema.    Assessment/Plan   1. Acute on chronic systolic CHF with cardiogenic shock: EF 20-25% in 12/18, ischemic cardiomyopathy.  Echo this admission with EF 25%, mild MR, mildly dilated RV, PASP 57 mmHg. - Coox 57.4% on dobutamine 1.5 mcg/kg/min.  With rise in creatinine and ongoing need for diuresis, will increase back to 2.  - CVP 11-12: Continue IV lasix 80 mg BID, will give dose of tolvaptan 30 mg today. - Continue digoxin 0.0625 mg daily.  - Home spironolactone and Coreg on hold for now => creatinine near baseline. 2. CAD: s/p CABG.  Mild TnI elevation likely demand ischemia.  Coronary angiography 1/18 with patent graft and no targets for revascularization. - Continue statin with downtrend in LFTs.   - Getting ASA 81 daily.  - No change to  current plan.   3. Atrial fibrillation: Paroxysmal.   - Remains in NSR.  - Continue amiodarone 200 daily to keep in NSR, elevated LFTs from shock liver and trending down.  - He is back on apixaban.  Hemoglobin down but no overt bleeding.  4. Elevated LFTs:  - Suspect shock liver. Support cardiac output.  - LFTs trending down.  5. Hyponatremia: Hypervolemic hyponatremia. - 122 - > 128 > 129 > 126 > 125  - Has had total 3 doses of Tolvaptan. - Will repeat Tolvaptan at 30 mg. 6. Delirium: - Worse 5/5 in setting of worsening hyponatremia.  - Reinforced fluid restriction.  - Na back down to 125.  - On Precedex still, have been unable to wean.  7. AKI: Suspect cardiorenal with cardiogenic shock.  - Creatinine 1.77 -> 1.89 -> 2.03. Baseline creatinine around 2.0.  8. Code status: DNR/DNI.  Palliative care has seen. Hospice discussions begun, son not ready yet to turn off ICD.  Will definitely need SNF at discharge (will be unable to return home).   - No change to current plan.   9. Hypokalemia:  - 4.1 today.  10. Anemia:  - Hgb 7.6 -> 9.2 -> 9.5 -> 9.2 -> 9.2 - No signs of overt bleeding.  Suspect chronic disease, renal disease play a role.   - Received feraheme 12/19/17 with low Iron sat.  - FOBT not done.  Prognosis guarded. Poor candidate for outpatient inotrope support with co-morbidities and relative immobility.   Graciella Freer, PA-C  12/22/2017 7:40 AM   Advanced Heart Failure Team Pager [phone] (M-F; 7a - 4p)  Please contact [facility] for night-coverage after hours (4p -7a ) and weekends on [email or web address]  Patient seen with PA, agree with the above note.  Mr [name] remains confused, have been unable to wean Precedex. He is able to tell me that he is in [facility]. CVP remains 11-12, creatinine up a bit to 2 and co-ox down some to 57%.    On exam, pleasantly confused.  JVP 10 cm.  Crackles at bases. Regular rhythm.   With rise in creatinine and need for ongoing  diuresis, increase dobutamine back to 2 for today.  Continue Lasix 80 mg IV bid with elevated CVP and will increase tolvaptan to 30 mg daily to see if we can use this to get off more fluid.   Sodium 125.  This likely contributes to delirium.  It is hard to fluid restrict him.  Increase tolvaptan to 30 mg daily.   I do not think he is a good candidate for home inotropes.  Poor function at baseline and remains delirious.  Agree with ongoing palliative care discussions, comfort measures/hospice would be appropriate.   Marca Ancona 12/22/2017 8:08 AM

## 2017-12-23 LAB — GLUCOSE, CAPILLARY
GLUCOSE-CAPILLARY: 159 mg/dL — AB (ref 65–99)
Glucose-Capillary: 215 mg/dL — ABNORMAL HIGH (ref 65–99)
Glucose-Capillary: 183 mg/dL — ABNORMAL HIGH (ref 65–99)
Glucose-Capillary: 211 mg/dL — ABNORMAL HIGH (ref 65–99)

## 2017-12-23 LAB — COMPREHENSIVE METABOLIC PANEL
ALBUMIN: 2.4 g/dL — AB (ref 3.5–5.0)
ALK PHOS: 109 U/L (ref 38–126)
ALT: 130 U/L — ABNORMAL HIGH (ref 17–63)
ANION GAP: 14 (ref 5–15)
AST: 41 U/L (ref 15–41)
BILIRUBIN TOTAL: 1.8 mg/dL — AB (ref 0.3–1.2)
BUN: 39 mg/dL — ABNORMAL HIGH (ref 6–20)
CALCIUM: 8.5 mg/dL — AB (ref 8.9–10.3)
CO2: 26 mmol/L (ref 22–32)
Chloride: 85 mmol/L — ABNORMAL LOW (ref 101–111)
Creatinine, Ser: 2 mg/dL — ABNORMAL HIGH (ref 0.61–1.24)
GFR calc non Af Amer: 30 mL/min — ABNORMAL LOW (ref >60)
GFR, EST AFRICAN AMERICAN: 35 mL/min — AB (ref >60)
GLUCOSE: 171 mg/dL — AB (ref 65–99)
Potassium: 3.7 mmol/L (ref 3.5–5.1)
Sodium: 125 mmol/L — ABNORMAL LOW (ref 135–145)
TOTAL PROTEIN: 5.9 g/dL — AB (ref 6.5–8.1)

## 2017-12-23 LAB — COOXEMETRY PANEL
Carboxyhemoglobin: 1.6 % — ABNORMAL HIGH (ref 0.5–1.5)
Carboxyhemoglobin: 2 % — ABNORMAL HIGH (ref 0.5–1.5)
Methemoglobin: 1.8 % — ABNORMAL HIGH (ref 0.0–1.5)
Methemoglobin: 1.1 % (ref 0.0–1.5)
O2 SAT: 50.6 %
O2 Saturation: 44.1 %
TOTAL HEMOGLOBIN: 10 g/dL — AB (ref 12.0–16.0)
Total hemoglobin: 10.1 g/dL — ABNORMAL LOW (ref 12.0–16.0)

## 2017-12-23 MED ORDER — FUROSEMIDE 10 MG/ML IJ SOLN
10.0000 mg/h | INTRAVENOUS | Status: DC
  Administered 2017-12-23: 10 mg/h via INTRAVENOUS
  Filled 2017-12-23: qty 20

## 2017-12-23 MED ORDER — FENTANYL CITRATE (PF) 100 MCG/2ML IJ SOLN
25.0000 ug | INTRAMUSCULAR | Status: DC | PRN
Start: 2017-12-23 — End: 2017-12-24

## 2017-12-23 MED ORDER — FUROSEMIDE 10 MG/ML IJ SOLN: 80.0000 mg | Freq: Two times a day (BID) | INTRAMUSCULAR | Status: DC

## 2017-12-23 MED ORDER — POTASSIUM CHLORIDE CRYS ER 20 MEQ PO TBCR
40.0000 meq | EXTENDED_RELEASE_TABLET | Freq: Once | ORAL | Status: AC
  Administered 2017-12-23: 40 meq via ORAL
  Filled 2017-12-23: qty 2

## 2017-12-23 MED ORDER — FENTANYL CITRATE (PF) 100 MCG/2ML IJ SOLN
50.0000 ug | INTRAMUSCULAR | Status: DC
  Administered 2017-12-23: 50 ug via INTRAVENOUS
  Filled 2017-12-23: qty 2

## 2017-12-23 MED ORDER — LORAZEPAM 2 MG/ML IJ SOLN: 0.5000 mg | INTRAMUSCULAR | Status: DC | PRN

## 2017-12-23 MED ORDER — GLYCOPYRROLATE 0.2 MG/ML IJ SOLN
0.2000 mg | Freq: Three times a day (TID) | INTRAMUSCULAR | Status: DC
  Administered 2017-12-23: 0.2 mg via INTRAVENOUS
  Filled 2017-12-23: qty 1

## 2017-12-23 MED ORDER — TOLVAPTAN 15 MG PO TABS
30.0000 mg | ORAL_TABLET | ORAL | Status: DC
  Administered 2017-12-23: 30 mg via ORAL
  Filled 2017-12-23: qty 2

## 2017-12-26 NOTE — Discharge Summary (Signed)
83 yo male transferred from [facility] with CHF exacerbation, weakness, edema, and bradycardia.  He was seen by cardiology.  Started dobutamine and lasix infusions.  Required precedex for agitated delirium.  He did not improved with aggressive medical therapies.  Palliative care consulted.  Family opted for DNR/DNI status and transition to comfort care.    Cause of death: Cardiogenic shock from HFrEF with EF 20% secondary to coronary artery disease  Final diagnoses: History of hypertension History of rheumatoid arthritis Hyponatremia Acute metabolic encephalopathy Acute renal failure with ATN CKD 3 Paroxysmal atrial fibrillation Diabetes mellitus Hypothyroidism Anemia of critical illness and chronic disease  Coralyn Helling, MD [facility] Pulmonary/Critical Care 12/26/2017, 1:34 PM

## 2017-12-29 ENCOUNTER — Telehealth: Payer: Self-pay

## 2017-12-29 NOTE — Telephone Encounter (Signed)
On 12/29/17 I received a d/c from [facility] (original). The d/c is for burial.   The patient is a patient of Doctor Sood. The d/c will be taken to [facility] ICU for signature.  On 12/30/17 I received the d/c back from [facility]. I got the d/c ready and called the funeral home to let them know I mailed the d/c to vital records per the funeral home request.

## 2018-01-14 NOTE — Progress Notes (Signed)
Physical Therapy Treatment Patient Details Name: [name] MRN: [id number] DOB: 1935-05-21 Today's Date: Dec 25, 2017    History of Present Illness 83 yo admitted with weakness, edema, bradycardia, CHF with acute metabolic encephalopathy. PMHx of CAD s/p CABG, HTN, systolic CHF with EF 20 to 25%, RA, Stage 3 CKD, PAF on eliquis, DM, hypothyroidism    PT Comments    Patient confused and c/o pain with all movements. Pt followed simple commands inconsistently and assisted to bring bilat LE to EOB however resistive to all attempts of getting supine to sit and yelling out "I can't. You're killing me!" PT will continue to follow acutely and progress as tolerated according to POC.     Follow Up Recommendations  SNF;Supervision/Assistance - 24 hour     Equipment Recommendations  None recommended by PT    Recommendations for Other Services       Precautions / Restrictions Precautions Precautions: Fall Precaution Comments: fluid restrictions Restrictions Weight Bearing Restrictions: No    Mobility  Bed Mobility               General bed mobility comments: pt followed commands for moving bilat LE toward EOB with assistance; pt resisting all attempts to flex trunk or use bilat UE to elevate trunk into sitting and yelling out "I can't. You're killing me"   Transfers                    Ambulation/Gait             General Gait Details: unable   Stairs             Wheelchair Mobility    Modified Rankin (Stroke Patients Only)       Balance                                            Cognition Arousal/Alertness: Awake/alert Behavior During Therapy: Flat affect Overall Cognitive Status: Impaired/Different from baseline Area of Impairment: Orientation;Attention;Memory;Following commands;Safety/judgement;Awareness;Problem solving                 Orientation Level: Disoriented to;Time;Situation Current Attention Level:  Focused Memory: Decreased recall of precautions;Decreased short-term memory Following Commands: Follows one step commands inconsistently Safety/Judgement: Decreased awareness of deficits;Decreased awareness of safety Awareness: Intellectual Problem Solving: Slow processing;Decreased initiation;Difficulty sequencing;Requires verbal cues;Requires tactile cues General Comments: perseverative on getting water and "get me out of here, will you?"      Exercises      General Comments        Pertinent Vitals/Pain Pain Assessment: Faces Faces Pain Scale: Hurts worst Pain Location: bilat LE, bilat UE, and back Pain Descriptors / Indicators: Guarding;Grimacing(yelling out ) Pain Intervention(s): Limited activity within patient's tolerance    Home Living                      Prior Function            PT Goals (current goals can now be found in the care plan section) Acute Rehab PT Goals Patient Stated Goal: get some water and "get out of here" PT Goal Formulation: With patient Time For Goal Achievement: 01/03/18 Potential to Achieve Goals: Fair Progress towards PT goals: Not progressing toward goals - comment    Frequency    Min 2X/week      PT Plan Current plan remains appropriate  Co-evaluation              AM-PAC PT "6 Clicks" Daily Activity  Outcome Measure  Difficulty turning over in bed (including adjusting bedclothes, sheets and blankets)?: Unable Difficulty moving from lying on back to sitting on the side of the bed? : Unable Difficulty sitting down on and standing up from a chair with arms (e.g., wheelchair, bedside commode, etc,.)?: Unable Help needed moving to and from a bed to chair (including a wheelchair)?: Total Help needed walking in hospital room?: Total Help needed climbing 3-5 steps with a railing? : Total 6 Click Score: 6    End of Session   Activity Tolerance: Patient limited by pain Patient left: with call bell/phone within  reach;in bed;with SCD's reapplied Nurse Communication: Need for lift equipment;Mobility status PT Visit Diagnosis: Unsteadiness on feet (R26.81);Muscle weakness (generalized) (M62.81)     Time: 3154-0086 PT Time Calculation (min) (ACUTE ONLY): 18 min  Charges:  $Therapeutic Activity: 8-22 mins                    G Codes:       Erline Levine, PTA Pager: [phone]     Carolynne Edouard 01/11/2018, 11:09 AM

## 2018-01-14 NOTE — Progress Notes (Signed)
Palliative Medicine RN Note: PMT NP Yong Channel has placed order to have AICD deactivated. I called cardiology master on Amion, who will call [employer].  Margret Chance Lee Kalt, RN, BSN, [facility] Palliative Medicine Team 01-20-2018 3:28 PM Office [phone]

## 2018-01-14 NOTE — Progress Notes (Signed)
Consult for prayer and end of life as some support has been removed. Visit with patient and son and offered spiritual care-listening, prayer concerns for family, patient and staff and for peace and strength for all during this particular time.  Patient/Son may want to request again as time goes by.  I told son chaplain are here for that purpose.  Phebe Colla, Chaplain   01/21/2018 1600  Clinical Encounter Type  Visited With Patient and family together  Visit Type Spiritual support;Patient actively dying  Referral From Nurse  Consult/Referral To Chaplain  Spiritual Encounters  Spiritual Needs Prayer;Emotional  Stress Factors  Patient Stress Factors Other (Comment) (dying)  Family Stress Factors Other (Comment) (dad is dying)

## 2018-01-14 NOTE — Progress Notes (Signed)
PCCM Progress Note  Admission date: 10-Jan-2018 Referring provider: Duke Salvia CC: Weakness  HPI: 83 yo male with recurrent admissions for CHF presented to [facility] with weakness and swelling with bradycardia.  PMHx of CAD s/p CABG, HTN, systolic CHF with EF 20 to 25%, RA, Stage 3 CKD, PAS on eliquis, DM, hypothyroidism.  Subjective: Off precedex, confused. Thinks he is at [employer].  Remains volume overloaded and inotrope dependent.  Very little progress   Vital signs: BP (!) 87/59 (BP Location: Left Arm)   Pulse 88   Temp 98.9 F (37.2 C) (Oral)   Resp (!) 23   Ht 5\' 10"  (1.778 m)   Wt 196 lb (88.9 kg)   SpO2 100%   BMI 28.12 kg/m   Intake/outpt: I/O last 3 completed shifts: In: 2713.6 [P.O.:2285; I.V.:428.6] Out: 3125 [Urine:3125]  Physical exam:  General - Confused elderly male supine in bed calling out. Eyes - PERRLA ENT - no sinus tenderness, no oral exudate, no LAN, + JVD Cardiac - S1, S2, RRR, No RMG Chest - Bilateral chest excursion, Clear with few rales noted per bases Abd - soft, non tender, ND, BS + Ext - 1-2 + BLE edema edema Skin - no rashes, thin frail with bruising Neuro - pleasantly confused, MAE x 4, Alert and oriented to self only  Labs: CBC Recent Labs    12/21/17 0429 12/22/17 0310  WBC 11.0* 7.4  HGB 9.2* 9.2*  HCT 29.1* 28.7*  PLT 144* 120*    Coag's No results for input(s): APTT, INR in the last 72 hours.  BMET Recent Labs    12/21/17 0429 12/22/17 0310 12/25/2017 0445  NA 126* 125* 125*  K 3.7 4.1 3.7  CL 87* 87* 85*  CO2 29 28 26   BUN 31* 37* 39*  CREATININE 1.89* 2.03* 2.00*  GLUCOSE 163* 205* 171*    Electrolytes Recent Labs    12/21/17 0429 12/22/17 0310 01/03/2018 0445  CALCIUM 8.6* 8.4* 8.5*    Sepsis Markers No results for input(s): PROCALCITON, O2SATVEN in the last 72 hours.  Invalid input(s): LACTICACIDVEN  ABG No results for input(s): PHART, PCO2ART, PO2ART in the last 72 hours.  Liver  Enzymes Recent Labs    12/21/17 0429 12/22/17 0310 01/07/2018 0445  AST 49* 39 41  ALT 227* 166* 130*  ALKPHOS 113 104 109  BILITOT 1.8* 1.5* 1.8*  ALBUMIN 2.9* 2.5* 2.4*    Cardiac Enzymes No results for input(s): TROPONINI, PROBNP in the last 72 hours.  Glucose Recent Labs    12/22/17 0736 12/22/17 1148 12/22/17 1547 12/22/17 1948 12/25/2017 0044 12/15/2017 0727  GLUCAP 165* 225* 198* 238* 215* 159*    Imaging Dg Chest Port 1 View  Result Date: 12/21/2017 CLINICAL DATA:  Productive cough. EXAM: PORTABLE CHEST 1 VIEW COMPARISON:  Radiograph of Dec 19, 2017. FINDINGS: Stable cardiomegaly with central pulmonary vascular congestion. Single lead left-sided pacemaker is unchanged in position. No pneumothorax or pleural effusion is noted. Right internal jugular catheter is unchanged in position. No acute pulmonary disease is noted. Bony thorax is unremarkable. IMPRESSION: Stable cardiomegaly with central pulmonary vascular congestion. Electronically Signed   By: 02/20/2018, M.D.   On: 12/21/2017 14:39   Studies: Echo 5/03 >> mild LVH, EF 25%, mild MR, PAS 57 mmHg  Lines/tubes: Rt IJ CVL 5/02 >>  Assessment/plan:  Acute on chronic systolic CHF with cardiogenic shock. Hx of CAD s/p CABG, PAF. Co - ox this am 44%, Increased Dobutamine to 3 from 2 Starting  Lasix gtt - d/w cardiology > not making any progress - Family to speak with Palliative care today re: goals of care - Appreciate cards assistance  Acute renal failure with ATN. CKD 3 - baseline creatinine 1.8. Hyponatremia in setting of CHF.>> contributing to delirium GAP is 14. - f/u BMET - Monitor UO - Replete electrolytes as needed( Potassium repleted 5/10)  Anemia of critical illness and chronic disease. - f/u CBC intermittently - Transfuse for HGB < 7  DM. Hypothyroidism. - continue synthroid - SSI  Acute metabolic encephalopathy. - likely from hyponatremia and hypotension - Precedex off 5/10 am  DVT  prophylaxis - heparin gtt SUP - not indiciated Nutrition - 2 gram sodium Goals of care - DNR/DNI, palliative care consulted.>> BiPAP only  Per Dr.McLean's note patient is now requiring increase in Dobutamine and has been started on Lasix gtt. Co-ox 44%. He is not improving despite these efforts.He is confused and he told me repeatedly he wants to go home. Palliative care to discuss Hospice as option today with family.   Bevelyn Ngo, AGACNP-BC [facility] 01/12/18, 8:47 AM

## 2018-01-14 NOTE — Progress Notes (Signed)
Palliative:  Mr. Amon' is more miserable today. He is anxious and complaining of pain. Saying he cannot take anymore. Granddaughter at bedside tearful and says that they went through this recently with her grandmother (patient's wife) so they know what is going on. Emotional support provided.   I met today with son, Belenda Cruise, who is tearful. He asks about how his father is doing and I shared that he is no better but he seems to be suffering more. Belenda Cruise shares that he has seen how his father has been worse each day. He talks of the great care he has received and that in the past he has had these episodes but typically is better within 2-3 days and was able to return home. Belenda Cruise sees that this time is different. He also says that his father has been morning the loss of his wife of 68 yrs and he knows that the will to live after she passed is not there as it has been in the past. After extensive discussion Belenda Cruise does agree that we should not let his father suffer and that now is the right time to focus on comfort.   We discussed stopping the medications as these are not getting the improvement that his father needs. We discussed deactivating AICD at this time and he agrees. Will transition to comfort - continue a few po meds as long as he is able to take po. May transition to 6N if he is able to made comfortable and titrate Precedex off once comfortable. We discussed possibility of hospital death vs hospice facility death. I did share with Belenda Cruise that I am unsure if his father would be able to make it to hospice. They had a good experience with hospice with his mother (he did note concern of hospice trying to give his mother morphine too soon). We discussed opioids in the management of pain and breathing at EOL and Belenda Cruise states that he knows his father is much more critical than his mother was at home on hospice. He says "I know that there is a time and a place for these medicines." They would not be  able to take him home. Belenda Cruise says his goal is that his father does not suffer. He agrees with comfort care at this time.  Emotional support provided to [facility].   Plan:   Fentanyl 50 mcg IV every 4 hours and 25-100 mcg IV every hour prn. Low threshold for infusion.   Ativan 0.5-1 mg IV every 4 hours prn. Once comfortable titrate off Precedex.   Robinul 0.2 mg IV TID. May increase dose/frequency as needed to manage secretions.   Minimizing medications. No more labs. Regular diet, no fluid restriction for comfort.   Palliative RN called to request deactivation of AICD. DNR.   May transfer to 6N if stable at EOL, comfortable, and off Precedex.   Once comfortable and not agitated may d/c telesitter.   37 min  Vinie Sill, NP Palliative Medicine Team Pager # [phone] (M-F 8a-5p) Team Phone # [phone] (Nights/Weekends)

## 2018-01-14 NOTE — Progress Notes (Addendum)
Patient ID: [name], male   DOB: 10/06/1934, 83 y.o.   MRN: [id number]     Advanced Heart Failure Rounding Note  PCP-Cardiologist: Olga Millers, MD   Subjective:    Admitted with volume overload. He was in cardiogenic shock.   Started on milrinone but later switched to dobutamine due to hypotension. Norepi later added. Norepinephrine weaned off on 5/3.  Coox 44.1% on dobutamine 2.0 mcg/kg/min.   Creatinine 2.00. CVP 13 I/O - 270  cc. Weight unchanged.   Remains intermittently confused. Understands that he really isn't making progress. Has said "I can't keep going on like this".   Plan palliative meeting with his son at 35 today. He is agitated and wants to drink as much as he wants, and go home. Attempted to gently explain this would be unsafe for him.   Na 122 -> Tolvaptan -> 128 -> 129 -> 126 -> Tolvaptan -> 125 -> Tolvaptan -> 125  Objective:   Weight Range: 196 lb (88.9 kg) Body mass index is 28.12 kg/m.   Vital Signs:   Temp:  [97.2 F (36.2 C)-99.2 F (37.3 C)] 99.2 F (37.3 C) (05/10 0412) Pulse Rate:  [74-258] 87 (05/10 0600) Resp:  [14-24] 22 (05/10 0600) BP: (81-114)/(54-67) 85/57 (05/10 0600) SpO2:  [85 %-100 %] 97 % (05/10 0600) Weight:  [196 lb (88.9 kg)] 196 lb (88.9 kg) (05/10 0422) Last BM Date: 12/18/17  Weight change: Filed Weights   12/21/17 0404 12/22/17 0327 12/16/2017 0422  Weight: 186 lb 15.2 oz (84.8 kg) 196 lb (88.9 kg) 196 lb (88.9 kg)    Intake/Output:   Intake/Output Summary (Last 24 hours) at 12/31/2017 0726 Last data filed at 01/06/2018 0600 Gross per 24 hour  Intake 2104.8 ml  Output 2375 ml  Net -270.2 ml    Physical Exam   CVP 13  General: Chronically ill appearing. No resp difficulty. HEENT: Normal Neck: Supple. JVP 10-11 cm+ Carotids 2+ bilat; no bruits. No thyromegaly or nodule noted. Cor: PMI nondisplaced. RRR, No M/G/R noted Lungs: CTAB, normal effort. Abdomen: Soft, non-tender, non-distended, no HSM. No bruits  or masses. +BS  Extremities: No cyanosis, clubbing, or rash. 1-2+ edema.  Neuro: Alert to person and place. Confused at times.   Telemetry   NSR 70-80s, personally reviewed.   EKG    No new tracings.    Labs    CBC Recent Labs    12/21/17 0429 12/22/17 0310  WBC 11.0* 7.4  HGB 9.2* 9.2*  HCT 29.1* 28.7*  MCV 79.7 79.9  PLT 144* 120*   Basic Metabolic Panel Recent Labs    87/86/76 0310 01/08/2018 0445  NA 125* 125*  K 4.1 3.7  CL 87* 85*  CO2 28 26  GLUCOSE 205* 171*  BUN 37* 39*  CREATININE 2.03* 2.00*  CALCIUM 8.4* 8.5*   Liver Function Tests Recent Labs    12/22/17 0310 01/03/2018 0445  AST 39 41  ALT 166* 130*  ALKPHOS 104 109  BILITOT 1.5* 1.8*  PROT 5.6* 5.9*  ALBUMIN 2.5* 2.4*   No results for input(s): LIPASE, AMYLASE in the last 72 hours. Cardiac Enzymes No results for input(s): CKTOTAL, CKMB, CKMBINDEX, TROPONINI in the last 72 hours.  BNP: BNP (last 3 results) Recent Labs    08/25/17 1140 09/09/17 0525 12/16/17 0345  BNP 1,102.9* 1,105.4* 1,012.3*    ProBNP (last 3 results) No results for input(s): PROBNP in the last 8760 hours.   D-Dimer No results for input(s): DDIMER in the  last 72 hours. Hemoglobin A1C No results for input(s): HGBA1C in the last 72 hours. Fasting Lipid Panel No results for input(s): CHOL, HDL, LDLCALC, TRIG, CHOLHDL, LDLDIRECT in the last 72 hours. Thyroid Function Tests No results for input(s): TSH, T4TOTAL, T3FREE, THYROIDAB in the last 72 hours.  Invalid input(s): FREET3  Other results:   Imaging    No results found.   Medications:     Scheduled Medications: . amiodarone  200 mg Oral Daily  . apixaban  2.5 mg Oral BID  . atorvastatin  10 mg Oral q1800  . chlorhexidine  15 mL Mouth Rinse BID  . Chlorhexidine Gluconate Cloth  6 each Topical Daily  . digoxin  0.0625 mg Oral QODAY  . feeding supplement (ENSURE ENLIVE)  237 mL Oral BID BM  . furosemide  80 mg Intravenous BID  . insulin  aspart  0-15 Units Subcutaneous Q4H  . levothyroxine  75 mcg Oral QAC breakfast  . mouth rinse  15 mL Mouth Rinse q12n4p  . OLANZapine zydis  2.5 mg Oral QHS  . sodium chloride flush  10-40 mL Intracatheter Q12H    Infusions: . sodium chloride Stopped (12/17/17 1000)  . dexmedetomidine (PRECEDEX) IV infusion Stopped (01/08/2018 0649)  . DOBUTamine 2 mcg/kg/min (01/07/2018 0600)    PRN Medications: sodium chloride, acetaminophen, bisacodyl, sodium chloride flush, traMADol    Patient Profile   [name] a 82 y.o.malewith CAD s/p CABG, hypertension, hyperlipidemia and chronic systolic and diastolic heart failure presenting today with sob at rest and increased leg edema.    Assessment/Plan   1. Acute on chronic systolic CHF with cardiogenic shock: EF 20-25% in 12/18, ischemic cardiomyopathy.  Echo this admission with EF 25%, mild Raymond, mildly dilated RV, PASP 57 mmHg. - Coox 44.1% this am on dobutamine 2 mcg/kg/min. Repeat coox. He is a very poor candidate for home inotrope with debility and co-morbidities.  - CVP 13.  - Continue IV lasix 80 mg BID, May need to increase, but would have to watch kidney function closely.  Will discuss tolvaptan with MD with lack of progress.  - Continue digoxin 0.0625 mg daily.  - Home spironolactone and Coreg on hold for now => creatinine near baseline. 2. CAD: s/p CABG.  Mild TnI elevation likely demand ischemia.  Coronary angiography 1/18 with patent graft and no targets for revascularization. - Continue statin with downtrend in LFTs.   - Getting ASA 81 daily.  - No change to current plan.   3. Atrial fibrillation: Paroxysmal.   - Remains in NSR.  - Continue amiodarone 200 daily to keep in NSR, elevated LFTs from shock liver and trending down.  - He is back on apixaban.  Hemoglobin has been down but no overt bleeding.  4. Elevated LFTs:  - Suspect shock liver. Support cardiac output.  - LFTs trending down.  5. Hyponatremia: Hypervolemic  hyponatremia. - 122 - > 128 > 129 > 126 > 125 >125 - Has had total 4 doses of Tolvaptan and has made little progress. Difficult to fluid restrict.  6. Delirium: - Worse 5/5 in setting of worsening hyponatremia.  - Reinforced fluid restriction.  - On Precedex still, have been unable to wean.   - Meds as above.  7. AKI: Suspect cardiorenal with cardiogenic shock.  - Creatinine 1.77 -> 1.89 -> 2.03 -> 2.00  - Baseline creatinine around 2.0.  8. Code status: DNR/DNI.  Palliative care has seen. Hospice discussions begun, son not ready yet to turn off ICD.  Will definitely need SNF at discharge (will be unable to return home).   - No change to current plan.   9. Hypokalemia:  - 3.7 today.  10. Anemia:  - Hgb has been stable ~ 9.2. No signs of overt bleeding.  - Suspect chronic disease, renal disease play a role.   - Received feraheme 12/19/17 with low Iron sat.  - FOBT not done.  Prognosis very guarded. Plan family meeting with palliative at 1230. We have been unable to optimize this patient for home despite aggressive diuresis and medication management.   Raymond Freer, PA-C  2018-01-07 7:26 AM   Advanced Heart Failure Team Pager [phone] (M-F; 7a - 4p)  Please contact [facility] for night-coverage after hours (4p -7a ) and weekends on [email or web address]  Patient seen with PA, agree with the above note.  [name] is alert this morning, oriented to place but not time, confusion overnight. CVP remains 13, creatinine stable at 2 and co-ox down some to 44% on dobutamine 2.  I/Os near even yesterday.  Na remains 125, he has a hard time with fluid restriction.   On exam, alert and able to answer questions.  JVP 10 cm.  Crackles at bases. Regular rhythm.   With low co-ox, I will increase dobutamine to 3.  Give 80 mg IV Lasix then Lasix at 10 mg/hr.    Sodium 125.  This likely contributes to delirium.  It is hard to fluid restrict him.  Continue tolvaptan 30 mg daily.     We are making  little progress, he remains volume overloaded and inotrope-dependent.  I do not think he is a good candidate for home inotropes.  Poor function at baseline with delirium.  Family is coming to talk with palliative care today.  I think comfort measures and trying to get him home for at least a short while with hospice would be the best option. I would fully support hospice care at this point.  I discussed this with [name] this morning.  I think that would be his wishes as well at this point, but given issues with delirium, will need to have the discussion with his family as well.   [name] 01-07-2018 8:25 AM

## 2018-01-14 NOTE — Progress Notes (Signed)
ICD turned off per previous note from [facility]  [facility] [employer] [phone]

## 2018-01-14 DEATH — deceased

## 2018-01-20 ENCOUNTER — Encounter (HOSPITAL_COMMUNITY): Payer: Medicare Other | Admitting: Cardiology

## 2018-04-14 ENCOUNTER — Ambulatory Visit: Payer: Medicare Other | Admitting: Cardiology

## 2019-09-09 IMAGING — CR DG CHEST 2V
2 series · 2 of 2 positions shown · non-contrast
Comparison: 05/04/2017

CLINICAL DATA: 82-year-old male presents with complaint of
bilateral leg and hand swelling. Fluid buildup.

EXAM:
CHEST  2 VIEW

[chest lat]
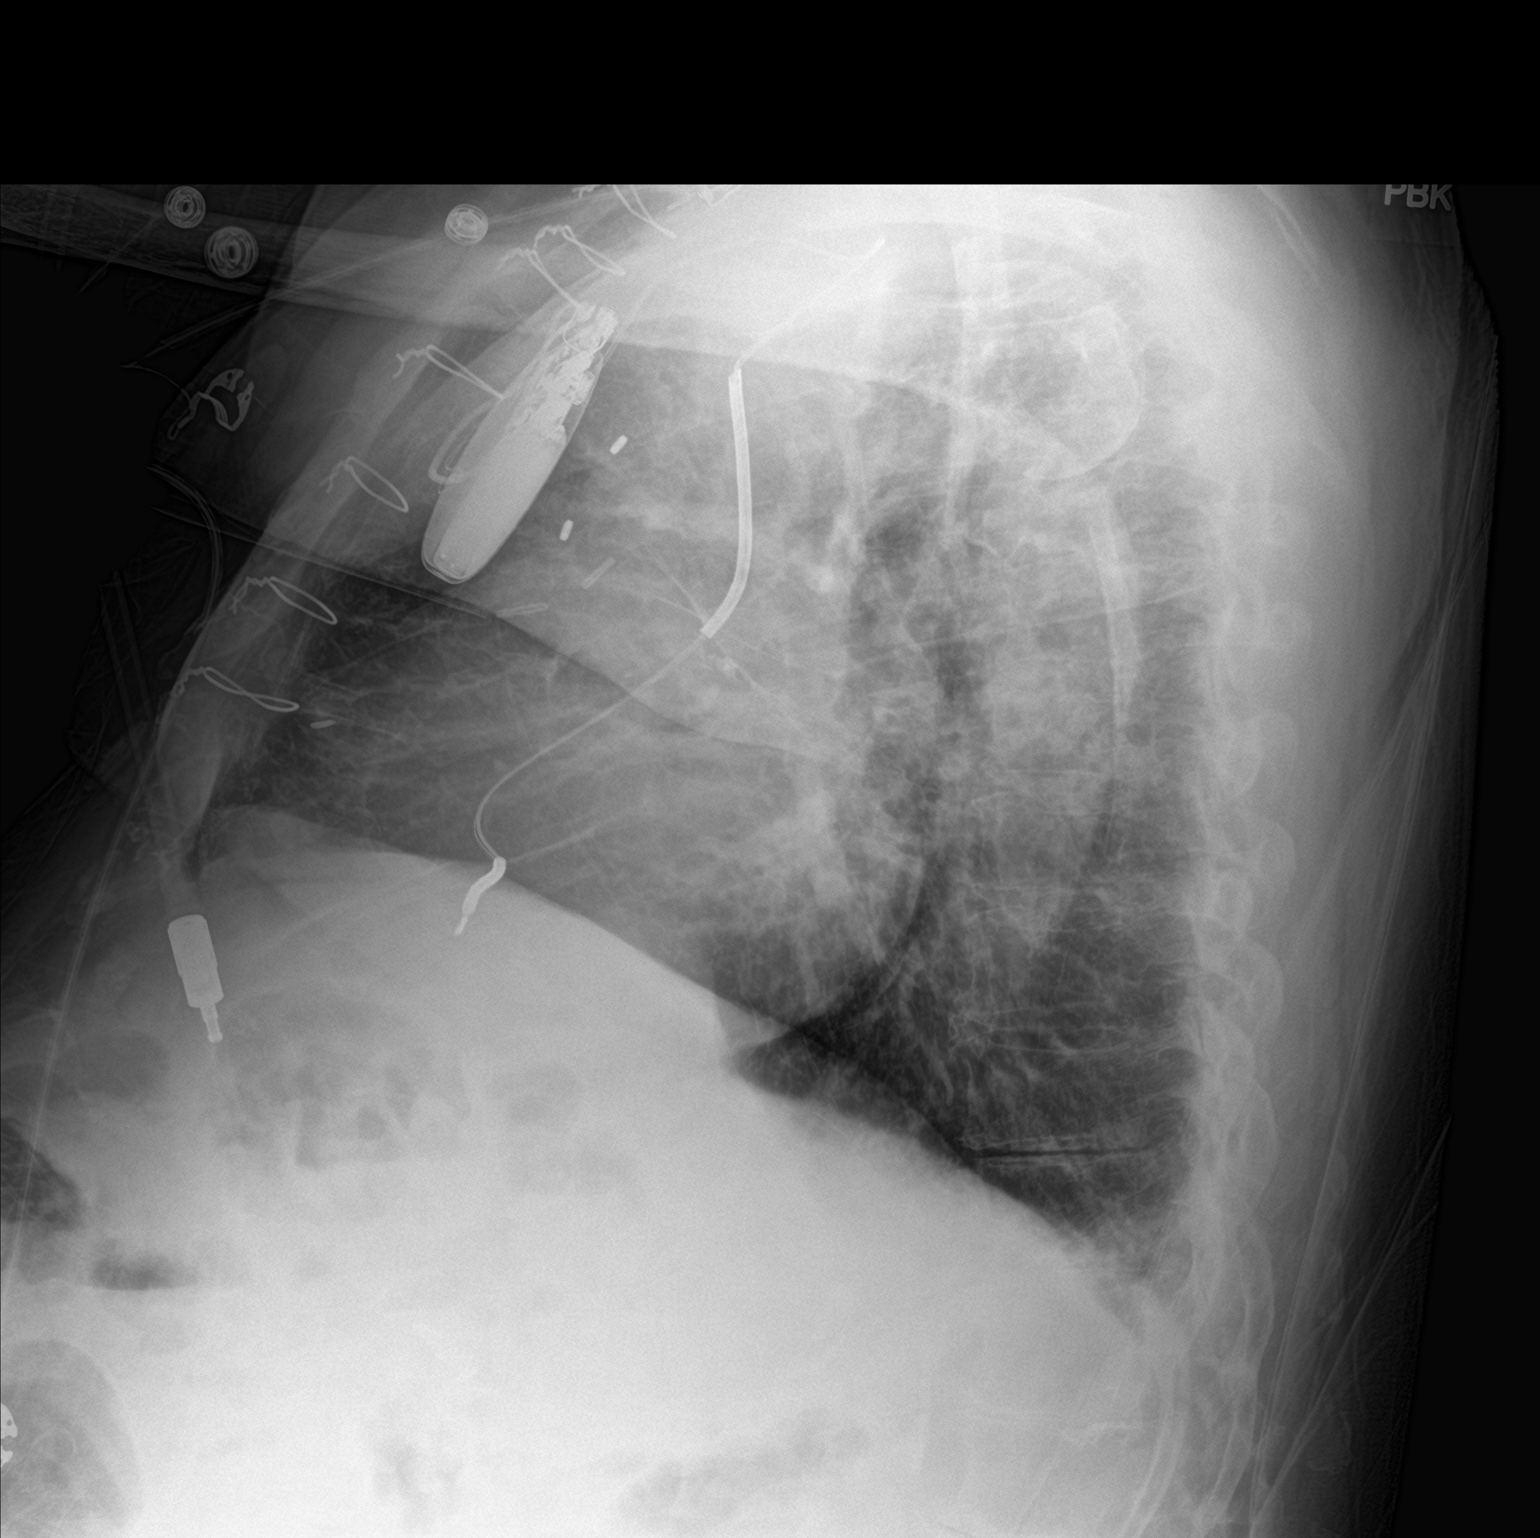

[chest ap]
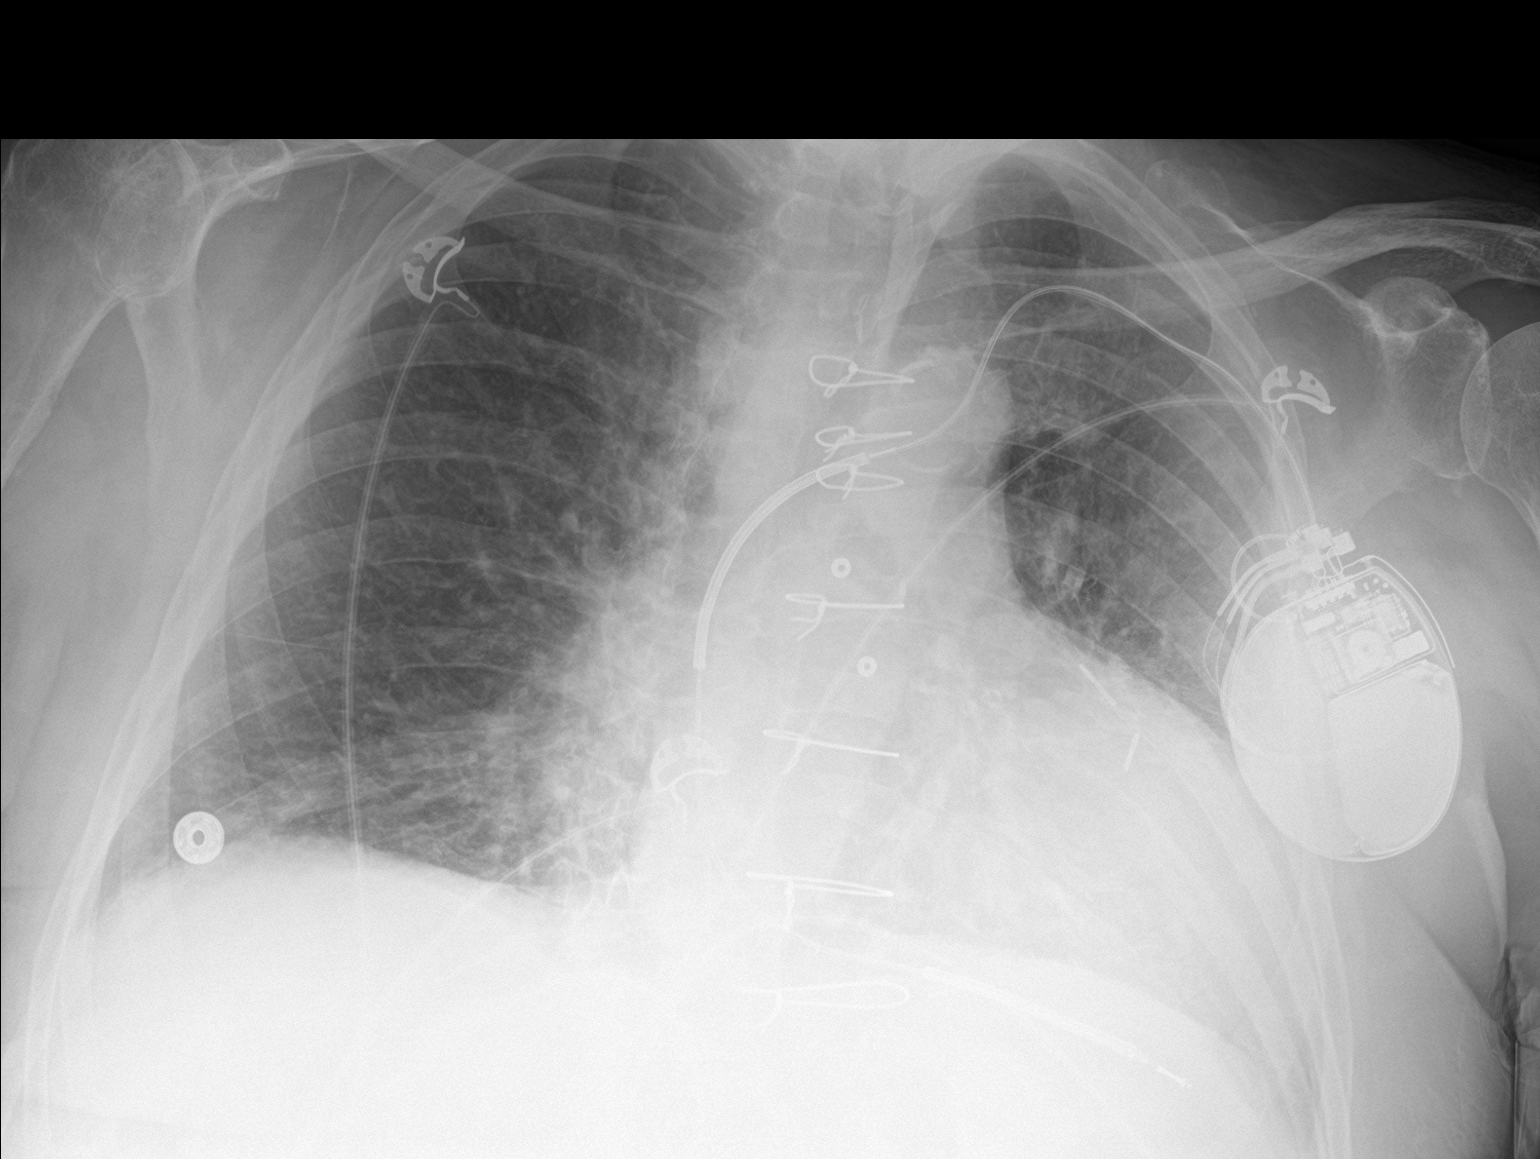

[2 of 2 positions shown; findings below may reference images not displayed]

FINDINGS: Stable cardiomegaly with aortic atherosclerosis. The patient is
status post CABG. ICD device projects over the left axilla with lead
in the right ventricle. Mild interstitial edema is noted, slightly
increased since prior.
IMPRESSION: 1. Cardiomegaly with slight interval increase in interstitial edema.
2. Aortic atherosclerosis.
3. ICD device in place.

## 2019-10-02 IMAGING — DX DG CHEST 1V PORT
1 series · 1 of 1 positions shown · non-contrast
Comparison: 07/19/2017

CLINICAL DATA: Shortness of breath, nausea/vomiting, dizziness.
Hypotension.

EXAM:
PORTABLE CHEST 1 VIEW

[chest]
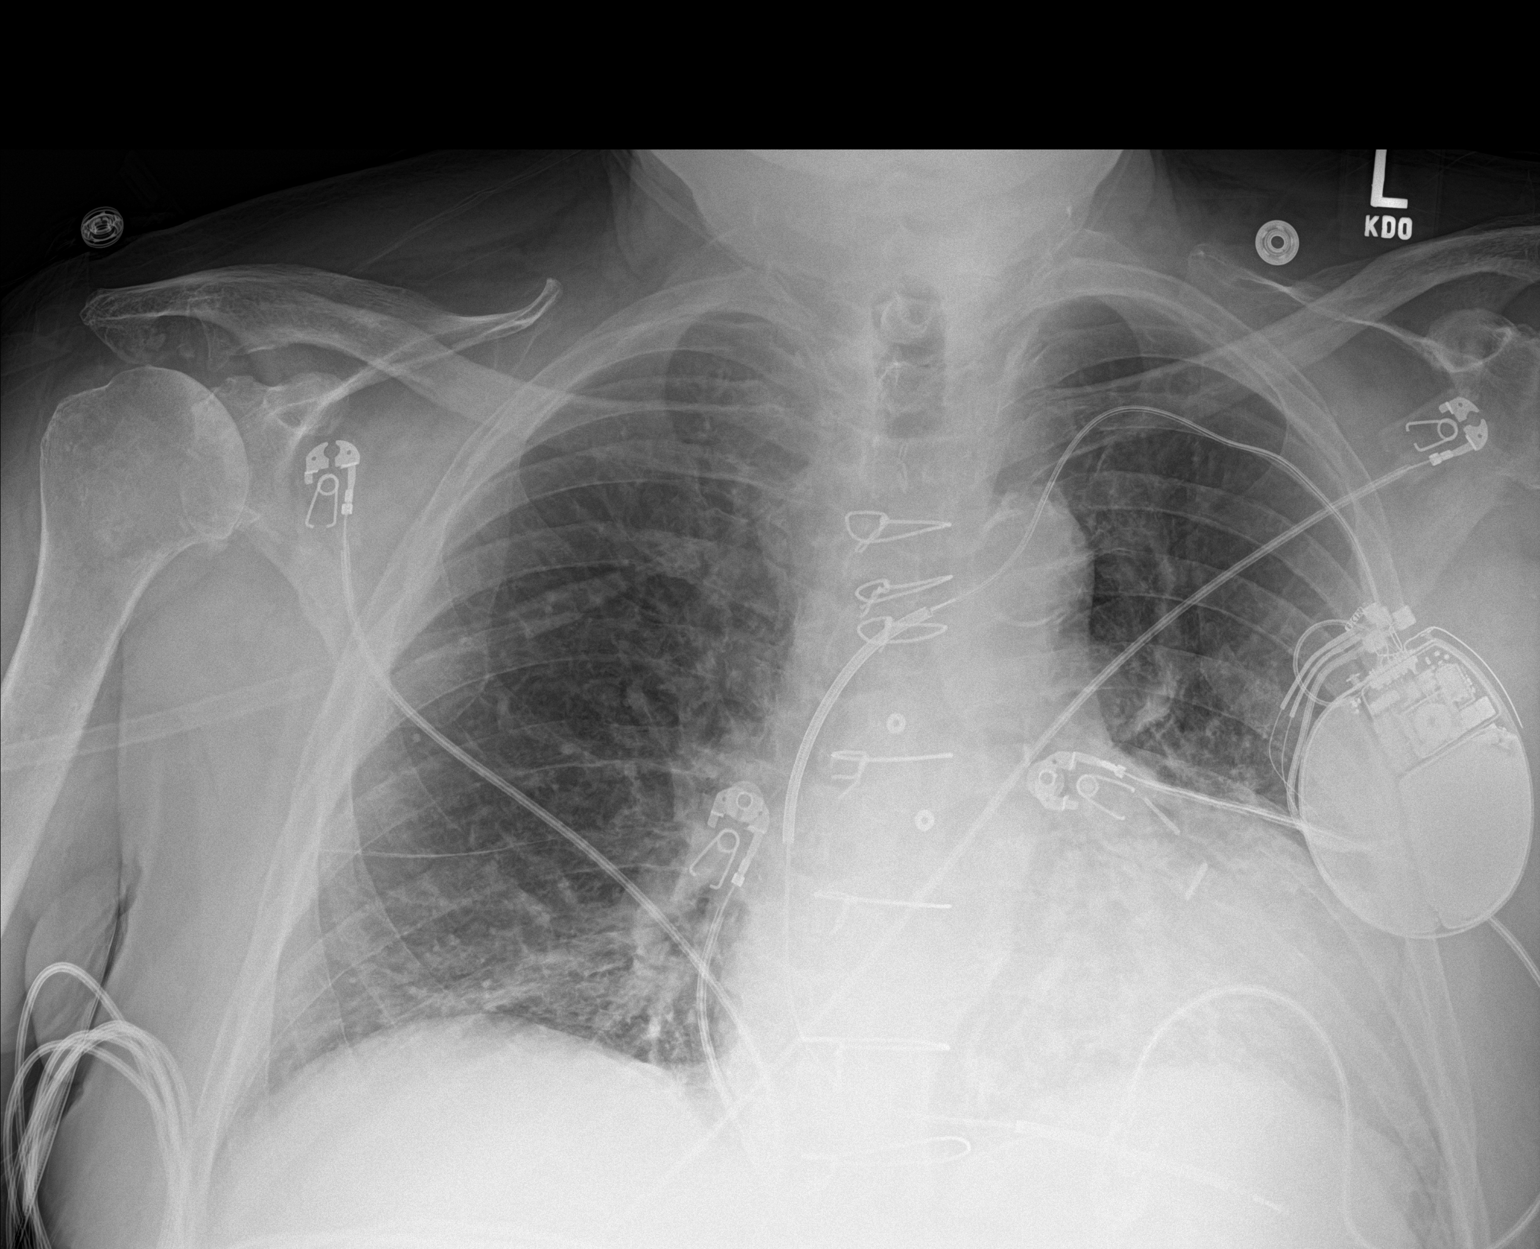

[1 of 1 positions shown; findings below may reference images not displayed]

FINDINGS: Cardiomegaly with pulmonary vascular congestion. No frank
interstitial edema. No pleural effusion or pneumothorax.

Left subclavian ICD.  Postsurgical changes related to prior CABG.
IMPRESSION: Cardiomegaly with pulmonary vascular congestion. No frank
interstitial edema.

## 2019-10-31 IMAGING — CR DG CHEST 2V
2 series · 2 of 2 positions shown · non-contrast
Comparison: 08/25/2017

CLINICAL DATA: Shortness of breath this morning.

EXAM:
CHEST  2 VIEW

[chest lat]
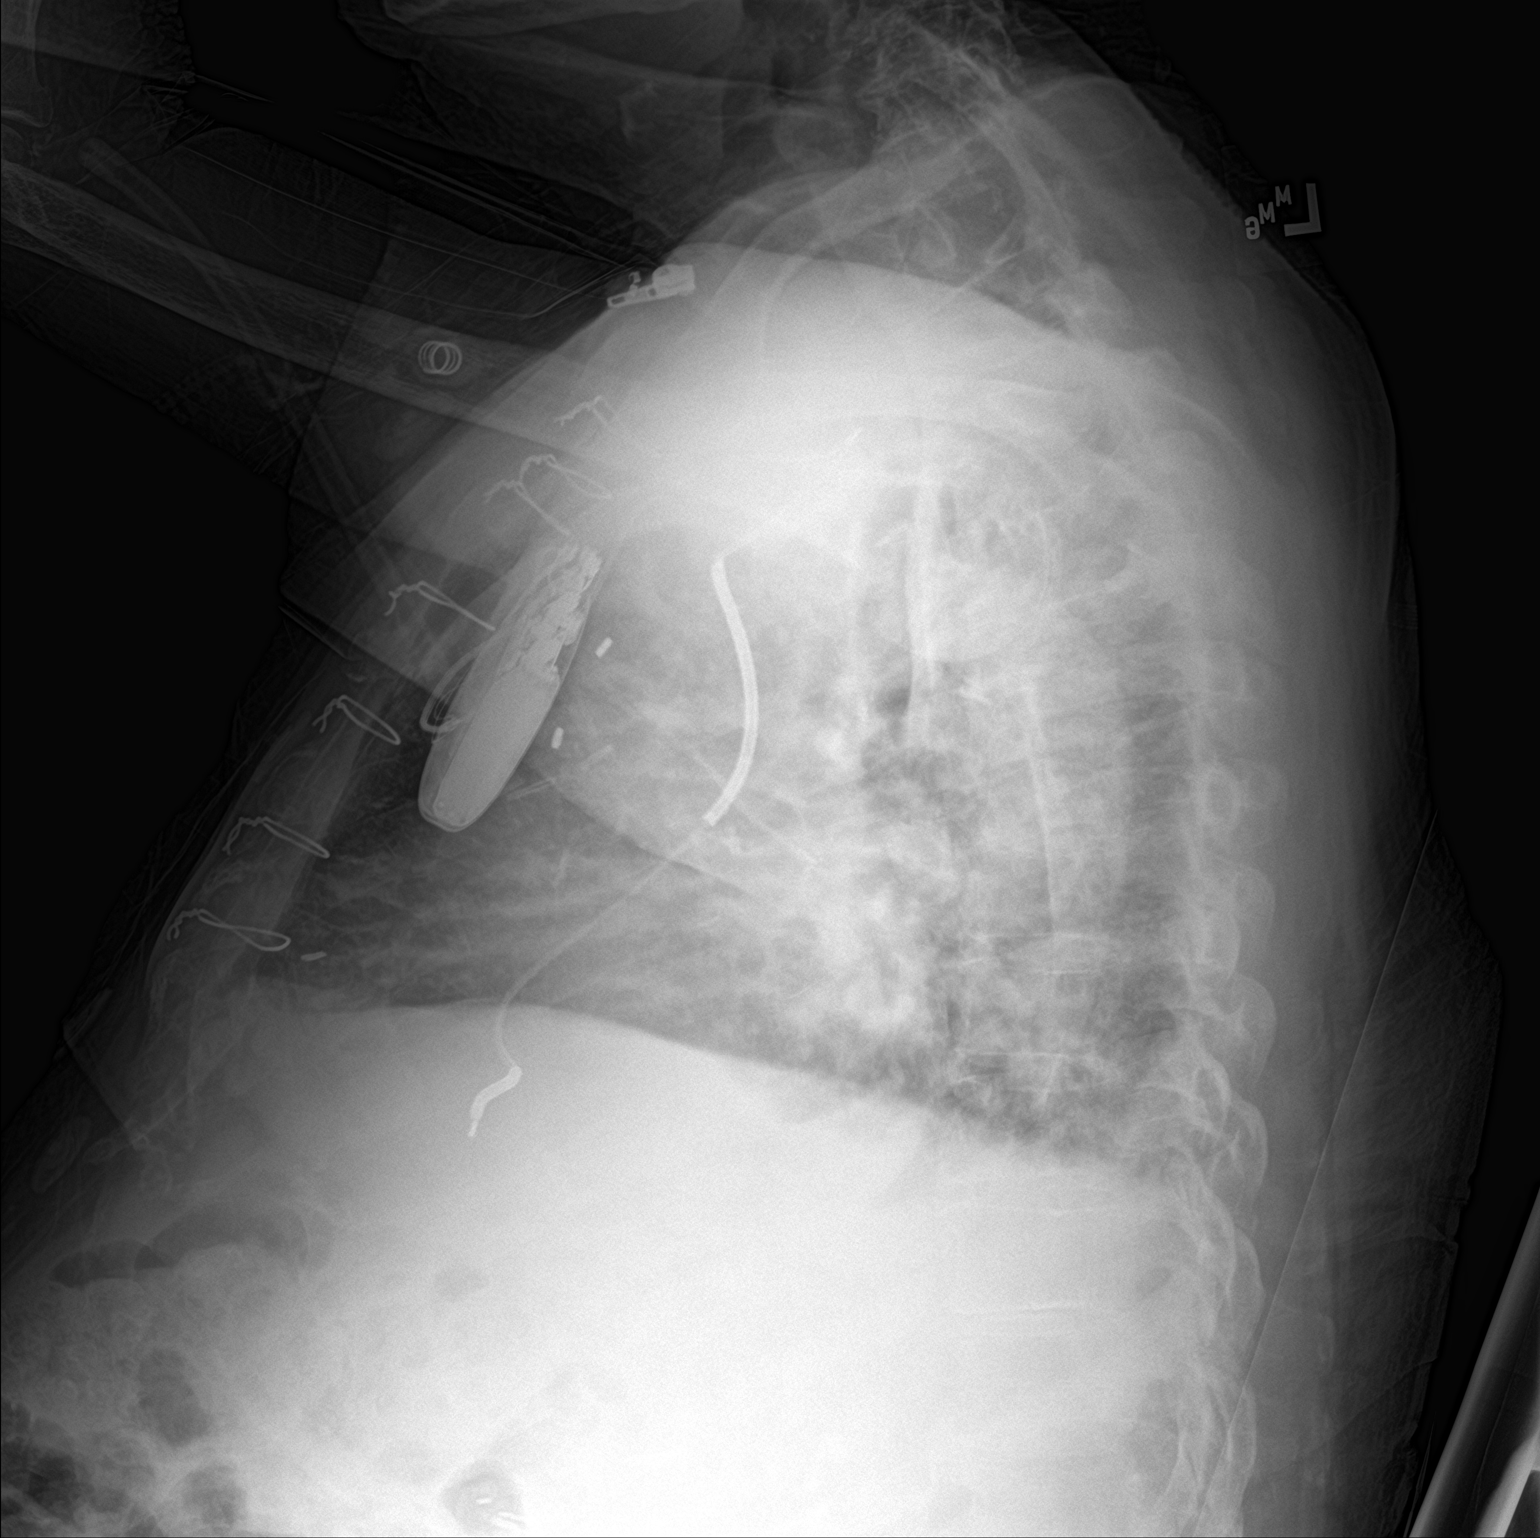

[chest ap]
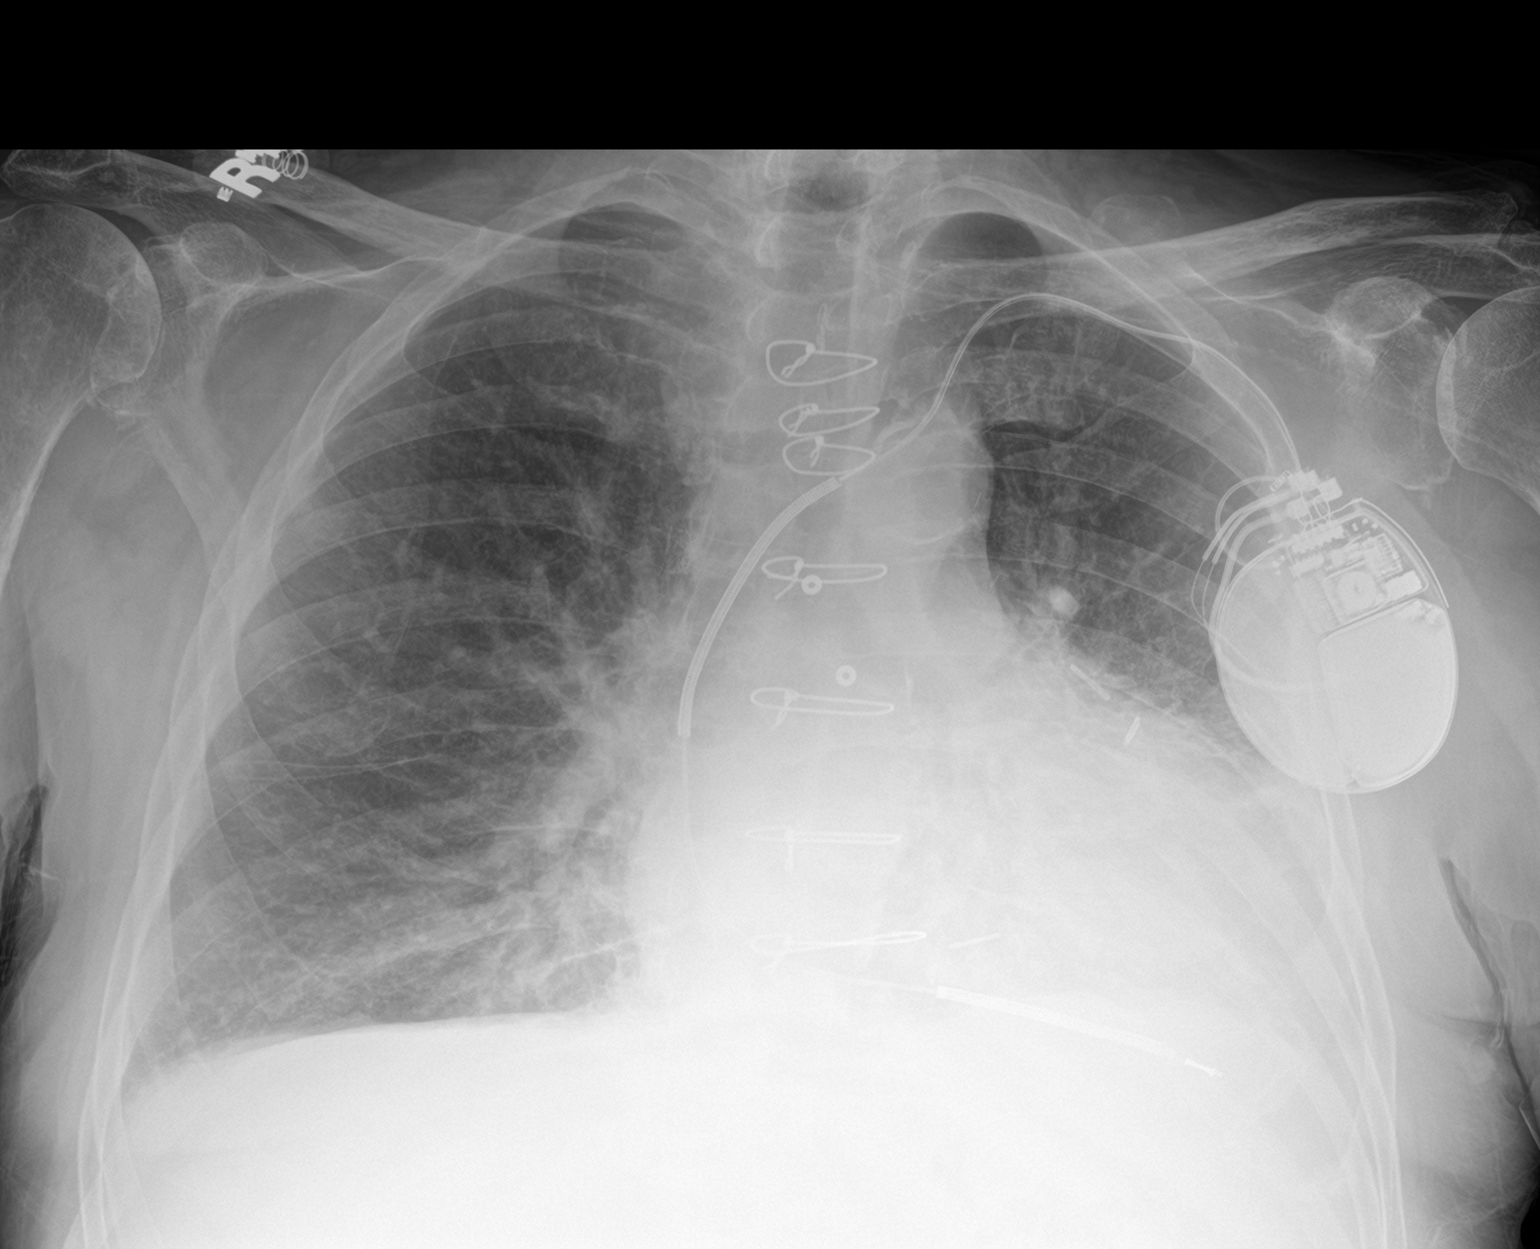

[2 of 2 positions shown; findings below may reference images not displayed]

FINDINGS: Postoperative changes in the mediastinum. Cardiac pacemaker. Cardiac
enlargement with pulmonary vascular congestion. Mild interstitial
changes in the lung bases likely due to early edema. There is
progression since previous study. No pleural effusion or
pneumothorax. Emphysematous changes in the lungs. Calcification of
the aorta.
IMPRESSION: Cardiac enlargement with pulmonary vascular congestion and
developing interstitial edema since previous study.
